# Patient Record
Sex: Male | Born: 1935 | Race: White | Hispanic: No | Marital: Married
Health system: Midwestern US, Community
[De-identification: ages and names within clinical notes are randomized; demographics above are authoritative.]

## PROBLEM LIST (undated history)

## (undated) DIAGNOSIS — Z01818 Encounter for other preprocedural examination: Secondary | ICD-10-CM

## (undated) DIAGNOSIS — C439 Malignant melanoma of skin, unspecified: Principal | ICD-10-CM

## (undated) DIAGNOSIS — U071 COVID-19: Secondary | ICD-10-CM

## (undated) DIAGNOSIS — N401 Enlarged prostate with lower urinary tract symptoms: Secondary | ICD-10-CM

## (undated) DIAGNOSIS — D649 Anemia, unspecified: Secondary | ICD-10-CM

## (undated) DIAGNOSIS — K223 Perforation of esophagus: Secondary | ICD-10-CM

## (undated) DIAGNOSIS — N400 Enlarged prostate without lower urinary tract symptoms: Secondary | ICD-10-CM

## (undated) DIAGNOSIS — K635 Polyp of colon: Secondary | ICD-10-CM

## (undated) DIAGNOSIS — Z950 Presence of cardiac pacemaker: Secondary | ICD-10-CM

## (undated) DIAGNOSIS — E559 Vitamin D deficiency, unspecified: Secondary | ICD-10-CM

## (undated) DIAGNOSIS — I4891 Unspecified atrial fibrillation: Secondary | ICD-10-CM

## (undated) DIAGNOSIS — I480 Paroxysmal atrial fibrillation: Secondary | ICD-10-CM

## (undated) HISTORY — PX: TOTAL HIP ARTHROPLASTY: SHX124

## (undated) HISTORY — DX: Benign prostatic hyperplasia with lower urinary tract symptoms: N40.1

## (undated) HISTORY — PX: OTHER SURGICAL HISTORY: SHX169

## (undated) HISTORY — PX: TOTAL KNEE ARTHROPLASTY: SHX125

## (undated) HISTORY — DX: Polyp of colon: K63.5

## (undated) HISTORY — PX: PACEMAKER INSERTION: SHX728

## (undated) HISTORY — PX: THORACOTOMY: SHX5074

## (undated) HISTORY — DX: COVID-19: U07.1

## (undated) HISTORY — DX: Paroxysmal atrial fibrillation: I48.0

## (undated) HISTORY — DX: Unspecified atrial fibrillation: I48.91

## (undated) HISTORY — DX: Perforation of esophagus: K22.3

## (undated) HISTORY — DX: Vitamin D deficiency, unspecified: E55.9

## (undated) HISTORY — DX: Presence of cardiac pacemaker: Z95.0

## (undated) HISTORY — DX: Hypomagnesemia: E83.42

## (undated) MED FILL — Acetaminophen Tab 325 MG: ORAL | Qty: 2 | Status: AC

---

## 2005-08-14 ENCOUNTER — Inpatient Hospital Stay (HOSPITAL_COMMUNITY): Admission: EM | Admit: 2005-08-14 | Discharge: 2005-09-11 | Payer: Self-pay | Admitting: Emergency Medicine

## 2005-09-18 ENCOUNTER — Encounter: Admission: RE | Admit: 2005-09-18 | Discharge: 2005-09-18 | Payer: Self-pay | Admitting: Cardiothoracic Surgery

## 2005-09-25 ENCOUNTER — Ambulatory Visit (HOSPITAL_COMMUNITY): Admission: RE | Admit: 2005-09-25 | Discharge: 2005-09-25 | Payer: Self-pay | Admitting: Cardiothoracic Surgery

## 2006-01-27 DIAGNOSIS — K223 Perforation of esophagus: Secondary | ICD-10-CM | POA: Insufficient documentation

## 2012-09-08 ENCOUNTER — Encounter

## 2012-09-12 LAB — EKG 12-LEAD: ECG Report: ABNORMAL

## 2012-09-12 NOTE — Patient Instructions (Signed)
Brett Middleton                                                                                                                     PRE OP INSTRUCTIONS FOR  Brett Middleton        Date: 09/12/2012    Date and time of surgery: 09/15/12 at 0830  Arrival Time: 0700    1. Do not eat or drink anything after 12 midnight prior to surgery. This includes no water, chewing gum, mints or ice chips.    2. Take the following pills with a small sip of water on the morning of Surgery: none    3. Diabetics may take evening dose of insulin but none after midnight.  If you feel symptomatic or low blood sugar take 1-2 ounces of apple juice only.    4. Aspirin, Ibuprofen, Advil, Naproxen, Vitamin E and other Anti-inflammatory products should be stopped  before surgery  as directed by your physician.    5. Check with your Doctor regarding stopping Plavix, Coumadin, Lovenox, Fragmin or other blood thinners.    6. Do not smoke,use illicit drugs and do not drink any alcoholic beverages 24 hours prior to surgery.    7. You may brush your teeth and gargle the morning of surgery.  DO NOT SWALLOW WATER    8. You MUST make arrangements for a responsible adult to take you home after your surgery. You will not be allowed to leave alone or drive yourself home.  It is strongly suggested someone stay with you the first 24 hrs. Your surgery will be cancelled if you do not have a ride home.    9. A parent/legal guardian must accompany a child scheduled for surgery and plan to stay at the hospital until the child is discharged.  Please do not bring other children with you.    10. Please wear simple, loose fitting clothing to the hospital.  Do not bring valuables (money, credit cards, checkbooks, etc.) Do not wear any makeup (including no eye makeup) or nail polish on your fingers or toes.    11. DO NOT wear any jewelry or piercings on day of surgery.  All body piercing jewelry must be removed.    12. Shower the night before surgery with  _X__Antibacterial soap ___Hibiclens.    13. Remember to bring Blood Bank bracelet to the hospital on the day of surgery.    14. If you have a Living Will and Durable Power of Attorney for Healthcare, please bring in a copy.    15. If appropriate bring crutches, inspirex, etc...    16. Notify your Surgeon if you develop any illness between now and surgery time, cough, cold, fever, sore throat, nausea, vomiting, etc.  Please notify your surgeon if you experience dizziness, shortness of breath or blurred vision between now & the time of your surgery.    17. If you have ___dentures, they will be removed before going to the OR; we will provide  you a container. If you wear ___contact lenses or _X__glasses, they will be removed; please bring a case for them.    18. To provide excellent care visitors will be limited to one in the room at any given time.    19. Please bring picture ID and insurance card.    20. Sleep apnea patients need to bring CPAP to hospital on day of surgery.                                                                                         21. Visit our web site for additional information: [email or web address]    22. During flu season no children under the age of 65 are permitted in the hospital for the safety of all patients.     23. Other Report to [facility]             Please call pre admission testing if you have any further questions.   Pre Admit Testing           [phone]     [facility] [phone]

## 2012-09-15 MED ORDER — IBUPROFEN 400 MG PO TABS
400 | Freq: Four times a day (QID) | ORAL | Status: DC | PRN
Start: 2012-09-15 — End: 2012-09-16

## 2012-09-15 MED ORDER — OXYCODONE-ACETAMINOPHEN 5-325 MG PO TABS
5-325 | ORAL | Status: DC | PRN
Start: 2012-09-15 — End: 2012-09-16

## 2012-09-15 MED ORDER — ACETAMINOPHEN 325 MG PO TABS
325 | ORAL | Status: DC | PRN
Start: 2012-09-15 — End: 2012-09-16

## 2012-09-15 MED ORDER — HYDROCODONE-ACETAMINOPHEN 5-325 MG PO TABS
5-325 | ORAL | Status: DC | PRN
Start: 2012-09-15 — End: 2012-09-16

## 2012-09-15 MED ORDER — FENTANYL CITRATE 0.05 MG/ML IJ SOLN
0.05 | INTRAMUSCULAR | Status: DC | PRN
Start: 2012-09-15 — End: 2012-09-16

## 2012-09-15 MED ADMIN — lactated ringers infusion: INTRAVENOUS | @ 13:00:00 | NDC 00338011704

## 2012-09-15 MED ADMIN — ceFAZolin (ANCEF) 2 g in dextrose 3% 50mL IVPB (duplex): INTRAVENOUS | @ 15:00:00 | NDC 00264310511

## 2012-09-15 MED FILL — LACTATED RINGERS IV SOLN: INTRAVENOUS | Qty: 1000

## 2012-09-15 MED FILL — CEFAZOLIN SODIUM-DEXTROSE 2-3 GM-% IV SOLR: 2-3 GM-% | INTRAVENOUS | Qty: 2

## 2012-09-15 MED FILL — LIDOCAINE-EPINEPHRINE 1 %-1:100000 IJ SOLN: 1 %-:00000 | INTRAMUSCULAR | Qty: 40

## 2012-09-15 NOTE — Op Note (Signed)
Brett Middleton, Brett Middleton                                [id number]      DATE OF PROCEDURE:  09/15/2012      SURGEON:  Terrence Dupont, M.D.      ASSISTANT:      PREOPERATIVE DIAGNOSIS:  Melanoma, left arm.      POSTOPERATIVE DIAGNOSIS:      OPERATION:  Excision melanoma, left arm.      ANESTHESIA:  Monitored anesthesia.      ESTIMATED BLOOD LOSS:      COMPLICATIONS:      OPERATIVE INDICATIONS: This a 78 year old gentleman who came to me with a   biopsy cone melanoma on his left upper arm.  He apparently had a dark mole on   his left upper arm that was removed by Dr. Caryl Asp on December 6th, and the   pathology showed melanoma of 0.44-mm thickness.   I have recommended a   conservative wide excision with 1-cm margin.  I have discussed the benefits,   risks, and complications of surgery with the patient.  All his questions have   been answered.      OPERATIVE PROCEDURE:  The patient was brought to the operating room and was   placed supine on the operating table.  Intravenous sedation and general   monitoring was done by the anesthesiologist.  Local anesthetic 1% lidocaine   with epinephrine was injected.      The lesion was outlined with marking pen.  A 15-blade was used to make the   incision.  An elliptical excision was made.  The defect measured 9 x 4.5 cm.   The wound was closed in layers using 3-0 Vicryl for the dermis and   subcutaneous tissue and 4-0 nylon for the skin.  Dressing consisted of   Steri-Strips, 4 x 4 gauze, and Kling.      The patient tolerated the procedure well and was transferred to the recovery   room in a stable condition.         Dictated by:  Terrence Dupont, M.D.               ANP / nmt   DD: 09/15/2012   11:46 A    DT: 09/15/2012  4:05 P   [id number]    [id number]   CC:  Jeris Penta, M.D.        Terrence Dupont, M.D.        Samuel Germany, M.D.

## 2012-09-15 NOTE — H&P (Signed)
Pre-operative History and Physical      Brett Middleton    DIAGNOSIS:  Melanoma left arm    PROCEDURE:  Excision melanoma left arm     CHIEF COMPLAINT:  Lesion left arm    HISTORY OF PRESENT ILLNESS:      The patient is a 78 y.o. male  who presents with lesion left arm    Past Medical History:        Diagnosis Date   ??? Hyperlipidemia        Past Surgical History:        Procedure Laterality Date   ??? Colonoscopy     ??? Skin biopsy     ??? Joint replacement Right 07/18/10     hip   ??? Pacemaker placement Left 04/2008       Medications Prior to Admission:   Zocor    Allergies:  Review of patient's allergies indicates no known allergies.    PHYSICAL EXAM:       VITALS:  There were no vitals taken for this visit.    Head/ENT:  normocepalic, without obvious abnormality    Neck:  supple, no masses    Lungs:  clear to auscultation bilaterally, no crackles or wheezing    Heart:  normal S1 and S2    Breasts:      Abdomen:  Soft, no masses palpable, normal bowel sounds    Extremities:  No clubbing, cyanosis, or edema    DATA:    CBC:   No results found for this basename: WBC, RBC, HGB, HCT, MCV, MCH, MCHC, RDW, PLT, MPV     BMP:    No results found for this basename: NA, K, CL, CO2, BUN, LABALBU, CREATININE, CALCIUM, GFRAA, LABGLOM, AGLUCOSE       ASSESSMENT AND PLAN:    1.  Patient is a 78 y.o. male with above specified procedure planned under MAC    2.  Procedure options, risks, benefits, complications and limitations reviewed with patient. He expresses understanding.      Brett Middleton  09/15/2012

## 2012-09-15 NOTE — Brief Op Note (Signed)
Brief Postoperative Note    Brett Middleton  Date of Birth:  [birth year]  [id number]    Pre-operative Diagnosis: Melanoma left arm    Post-operative Diagnosis: Same    Procedure: Excision melanoma left arm    Anesthesia: MAC    Surgeons/Assistants: Oma Alpert N Coley Littles    Estimated Blood Loss: Minimal    Complications: none    Specimens: were obtained      [name] N Caidance Sybert

## 2012-09-15 NOTE — Anesthesia Post-Procedure Evaluation (Signed)
Department of Anesthesiology  Post-Anesthesia Note    Name:  Brett Middleton                                         Age:  78 y.o.  MRN:  [id number]     Last Vitals:  BP 137/68   Pulse 78   Temp(Src) 97.7 ??F (36.5 ??C) (Temporal)   Resp 16   Wt 188 lb (85.276 kg)   BMI 26.23 kg/m2   SpO2 98%  Patient Vitals for the past 4 hrs:   BP Temp Temp src Pulse Resp SpO2 Weight   09/15/12 0713 137/68 mmHg 97.7 ??F (36.5 ??C) Temporal 78 16 98 % 188 lb (85.276 kg)       Level of Consciousness:  Awake    Respiratory:  Stable    Oxygen Saturation:  Stable    Cardiovascular:  Stable    Hydration:  Adequate    PONV:  Stable    Post-op Pain:  Adequate analgesia    Post-op Assessment:  No apparent anesthetic complications    Additional Follow-Up / Treatment / Comment:  None    Sharion Balloon, DO  September 15, 2012   10:31 AM

## 2012-09-15 NOTE — Discharge Instructions (Signed)
DISCHARGE INSTRUCTIONS - Brett Middleton N [place]    General    *You can shower daily  *Resume your usual diet. Good nutrition promotes healing. Increase fluid intake.   *Do not bend, lift or carry anything heavy or indulge in any vigorous activity for 2 weeks    Dressings/Incision care    *If you have steristrips along the incisions, you can wash on top of it, but leave them intact      Medications    *Take the pain meds if prescribed, otherwise take tylenol or ibuprofen  *Call your family doctor to check if you can resume all your regular medications    Signs of Infection    *Call your surgeon if severe uncontrolled pain at the operated area, redness, swelling, odor or green/yellow discharge around the site      Appointment    *Please call our office [phone] to schedule appointment in 15 days    In an emergency, call 911 or go to an Emergency Department at a nearby hospital          I understand and acknowledge receipt of the above instructions.                                                                                                                                          Patient or Guardian Signature                                                         Date/Time                                                                                                                                            Physician's or R.N.'s Signature                                                                    Date/Time      The discharge instructions have been reviewed with the patient and/or Guardian.  Patient and/or Guardian signed and retained a printed copy.

## 2012-09-15 NOTE — Anesthesia Pre-Procedure Evaluation (Signed)
Brett Middleton       Department of Anesthesiology  Pre-Anesthesia Evaluation/Consultation       Name:  Brett Middleton                                         Age:  78 y.o.  MRN:  [id number]           Procedure (Scheduled):  exc lesion arm  Surgeon:  Dr. Zannie Cove     No Known Allergies  There is no problem list on file for this patient.    Past Medical History   Diagnosis Date   ??? Hyperlipidemia      Past Surgical History   Procedure Laterality Date   ??? Colonoscopy     ??? Skin biopsy     ??? Joint replacement Right 07/18/10     hip   ??? Pacemaker placement Left 04/2008     History   Substance Use Topics   ??? Smoking status: Former Smoker   ??? Smokeless tobacco: Former Neurosurgeon     Quit date: 09/08/1972   ??? Alcohol Use: No     Medications  Current Outpatient Prescriptions on File Prior to Encounter   Medication Sig Dispense Refill   ??? simvastatin (ZOCOR) 20 MG tablet Take 20 mg by mouth nightly.       ??? Cyanocobalamin (B-12) 2500 MCG TABS Take 2,500 mcg by mouth every other day.         No current facility-administered medications on file prior to encounter.     Current Outpatient Prescriptions   Medication Sig Dispense Refill   ??? simvastatin (ZOCOR) 20 MG tablet Take 20 mg by mouth nightly.       ??? Cyanocobalamin (B-12) 2500 MCG TABS Take 2,500 mcg by mouth every other day.         Current Facility-Administered Medications   Medication Dose Route Frequency Provider Last Rate Last Dose   ??? lactated ringers infusion   Intravenous Continuous Judieth Keens, MD 10 mL/hr at 09/15/12 [phone]     ??? sodium chloride flush 0.9 % injection 10 mL  10 mL Intravenous Q12H SCH Arvind N Padubidri, MD       ??? sodium chloride flush 0.9 % injection 10 mL  10 mL Intravenous PRN Terrence Dupont, MD       ??? ceFAZolin (ANCEF) 2 g in dextrose 3% 50mL IVPB (duplex)  2 g Intravenous On Call to OR Terrence Dupont, MD         Vital Signs (Current)   Filed Vitals:    09/15/12 0713   BP: 137/68   Pulse: 78   Temp: 97.7 ??F (36.5 ??C)   Resp: 16         Vital Signs Statistics (for past 48 hrs)     BP  Min: 137/68   Min taken time: 09/15/12 0713  Max: 137/68   Max taken time: 09/15/12 0713  Temp  Avg: 97.7 ??F (36.5 ??C)  Min: 97.7 ??F (36.5 ??C)   Min taken time: 09/15/12 0713  Max: 97.7 ??F (36.5 ??C)   Max taken time: 09/15/12 0713  Pulse  Avg: 78  Min: 78   Min taken time: 09/15/12 0713  Max: 78   Max taken time: 09/15/12 0713  Resp  Avg: 16  Min: 16   Min taken time: 09/15/12 0713  Max: 16  Max taken time: 09/15/12 0713  SpO2  Avg: 98 %  Min: 98 %   Min taken time: 09/15/12 0713  Max: 98 %   Max taken time: 09/15/12 0713    BP Readings from Last 3 Encounters:   09/15/12 137/68   09/12/12 118/72     BMI  Body mass index is 26.23 kg/(m^2).  Estimated body mass index is 26.23 kg/(m^2) as calculated from the following:    Height as of 09/08/12: 5\' 11"  (1.803 m).    Weight as of this encounter: 188 lb (85.276 kg).    CBC   No results found for this basename: WBC, RBC, HGB, HCT, MCV, RDW, PLT     CMP    No results found for this basename: NA, K, CL, CO2, BUN, CREATININE, GFRAA, AGRATIO, LABGLOM, GLUCOSE, GLU, PROT, CALCIUM, BILITOT, ALKPHOS, AST, ALT     BMP    No results found for this basename: NA, K, CL, CO2, BUN, CREATININE, CALCIUM, GFRAA, LABGLOM, GLUCOSE, GLU     POCGlucose  No results found for this basename: GLUCOSE,  in the last 72 hours   Coags    No results found for this basename: PROTIME, INR, APTT     HCG (If Applicable)   No results found for this basename: PREGTESTUR, PREGSERUM, HCG, HCGQUANT      ABGs   No results found for this basename: PHART, PO2ART, PCO2ART, HCO3ART, BEART, O2SATART      Type & Screen (If Applicable)  No results found for this basename: LABABO, LABRH     Radiology (If Applicable)  Cardiac Testing (If Applicable)   EKG (If Applicable) SR, LAHB   Anesthesia Evaluation     Patient summary reviewed    Airway   Mallampati: II  TM distance: >3 FB  Neck ROM: full  Dental      Pulmonary - negative ROS and normal exam   Cardiovascular -  normal exam  (+) pacemaker,     Rhythm: regular  Rate: normal    Neuro/Psych - negative ROS   GI/Hepatic/Renal - negative ROS     Endo/Other - negative ROS   Abdominal  - normal exam                  Allergies: Review of patient's allergies indicates no known allergies.    NPO Status: Time of last liquid consumption: 1800                       Time of last solid food consumption: 1800    Anesthesia Plan    ASA 3     other   (TIVA planned with supplemental oxygen.  D/W pt. Plan and agreed.)  intravenous induction   Anesthetic plan and risks discussed with patient.    Plan discussed with CRNA.          Sharion Balloon  09/15/2012

## 2015-07-29 NOTE — Care Coordination-Inpatient (Signed)
Social Work:    Social service met with Brett Middleton and his wife today in boot-camp for December 2nd, left TKA.  Social work advised patient of social work role, PT/OT evaluations after surgery, goal of home with home care therapy, durable medical equipment recommended, skilled rehab. if necessary and eligible under insurance guidelines, and hospital length of stay.     Brett Middleton resides in at home with his wife. He has a cane, and walker, and plans to return home with MVI (after choices).      Electronically signed by Windy Carina, LSW on 07/29/2015 at 11:07 AM

## 2015-07-30 NOTE — Care Coordination-Inpatient (Signed)
I met with Mr. & Mrs. Vanatta on 07/29/15 for an orthopaedic education class. We discussed information regarding pre, intra, post-op, discharge, and LOS expectations. I presented the Total Knee video, and provided ortho education materials. I encouraged the patient to call with any questions or concerns. They also met with Sharyn Lull from SW to discuss discharge planning.

## 2015-07-31 ENCOUNTER — Encounter

## 2015-07-31 LAB — CBC WITH AUTO DIFFERENTIAL
Basophils %: 0 % (ref 0–2)
Basophils Absolute: 0.02 E9/L (ref 0.00–0.20)
Eosinophils %: 2 % (ref 0–6)
Eosinophils Absolute: 0.07 E9/L (ref 0.05–0.50)
Hematocrit: 36.5 % — ABNORMAL LOW (ref 37.0–54.0)
Hemoglobin: 12.5 g/dL (ref 12.5–16.5)
Lymphocytes %: 21 % (ref 20–42)
Lymphocytes Absolute: 1.03 E9/L — ABNORMAL LOW (ref 1.50–4.00)
MCH: 31.1 pg (ref 26.0–35.0)
MCHC: 34.4 % (ref 32.0–34.5)
MCV: 90.5 fL (ref 80.0–99.9)
MPV: 8.3 fL (ref 7.0–12.0)
Monocytes %: 10 % (ref 2–12)
Monocytes Absolute: 0.5 E9/L (ref 0.10–0.95)
Neutrophils %: 66 % (ref 43–80)
Neutrophils Absolute: 3.19 E9/L (ref 1.80–7.30)
PLATELET SLIDE REVIEW: DECREASED
Platelets: 109 E9/L — ABNORMAL LOW (ref 130–450)
RBC: 4.03 E12/L (ref 3.80–5.80)
RDW: 18.3 fL — ABNORMAL HIGH (ref 11.5–15.0)
WBC: 4.8 E9/L (ref 4.5–11.5)

## 2015-07-31 LAB — PROTIME-INR
INR: 1.1
Protime: 11.5 s (ref 9.3–12.4)

## 2015-07-31 LAB — BASIC METABOLIC PANEL
Anion Gap: 11 mmol/L (ref 7–16)
BUN: 18 mg/dL (ref 8–23)
CO2: 27 mmol/L (ref 22–29)
Calcium: 9 mg/dL (ref 8.6–10.2)
Chloride: 97 mmol/L — ABNORMAL LOW (ref 98–107)
Creatinine: 1 mg/dL (ref 0.7–1.2)
GFR African American: >60
GFR Non-African American: >60 mL/min/{1.73_m2} (ref >=60)
Glucose: 91 mg/dL (ref 74–109)
Potassium: 4.6 mmol/L (ref 3.5–5.0)
Sodium: 135 mmol/L (ref 132–146)

## 2015-07-31 LAB — PLATELET CONFIRMATION

## 2015-08-08 DIAGNOSIS — M1712 Unilateral primary osteoarthritis, left knee: Secondary | ICD-10-CM | POA: Insufficient documentation

## 2015-08-13 NOTE — H&P (Signed)
Margarita RanaSESCOURKA, Sparrow                                [id number]      DATE:  06/13/2015         CHIEF COMPLAINT:  Left knee pain.      HISTORY OF PRESENT ILLNESS:  This is an 81 year old white male patient with   chronic left knee pain secondary to osteoarthritis.  He has failed   conservative therapies of anti-inflammatories, analgesics, and injections and   bracing and has now decided to proceed with left total knee arthroplasty.      PAST MEDICAL HISTORY:  Past medical history is significant for   1.    Heart disease   2.    Hypertension      PAST SURGICAL HISTORY:  Prior surgeries include   1.    Right total hip arthroplasty   2.    Pacer insertion   3.    Esophageal tear repair      FAMILY HISTORY:  Family history is significant for   1.    Liver disease   2.    Myocardial infarction      ALLERGIES:  None.      CURRENT MEDICATIONS:   1.    Ferrous sulfate   2.    Neurontin   3.    Zocor   4.    Vitamin B12      SOCIAL HISTORY:  Admits to prior tobacco use.  Denies alcohol consumption.   PCP is Dr. Meriel PicaMikula.      REVIEW OF SYSTEMS:  Allergies as stated above.  Skin and lymphatic:  Denies   rashes and lesions.  CNS:  No prior CVAs.  Respiratory:  No cough or   shortness of breath.  Circulatory:  History of heart disease and   hypertension.  Digestive:  No dyspepsia.  Genitourinary:  No dysuria.   Metabolic and endocrine:  No thyroid disease or diabetes.  Hematologic:  No   anemia.  Musculoskeletal:  Positive OA.  Psychiatric:  Negative.                                  PHYSICAL EXAMINATION      GENERAL APPEARANCE:  Well-nourished, well-developed 81 year old white male in   no acute distress, alert and oriented x3.      HEENT:  Head normocephalic and atraumatic.  Ears clear and functional.  Eyes   and fundi:  PERRLA.  Nose clear without deviation.  Mouth, teeth, throat:   Clear and functional.      NECK:  Supple.  No JVD.      CHEST:  Normal excursion.  Breasts deferred.      LUNGS:  Clear to auscultation and  percussion.      CARDIOVASCULAR:  Heart:  Regular rate and rhythm.  No murmurs, gallops, rubs   appreciated.      ABDOMEN:  Round, soft, nontender.      HERNIA:  Negative.      GENITALIA:  Exam deferred.      RECTAL:  Exam deferred.      BACK:  Nontender.      EXTREMITIES:  Without clubbing, cyanosis, or edema.  Exam of left knee:  10   to 100 degrees of range of motion with positive medial joint tenderness,   varus alignment,  positive laxity.  No effusion.  Positive crepitus.  Distal   pulses 2/2.  Neurovascular status distally intact.      SKIN:  Skin without rashes, masses, or lesions.      LYMPHATICS:  Lymph nodes:  No adenopathy.      NEUROLOGICAL:  Cranial nerves 2 through 12 are grossly intact.      CLINICAL IMPRESSION:   OA, left knee.      PLAN:  Left total knee arthroplasty.            Dictated by:  Baruch Merl, M.D.         Joanne Gavel   DD: 08/13/2015   08:22 A    DT: 08/13/2015 09:53 A   [id number]    [id number]   CC:   Baruch Merl, M.D.

## 2015-08-20 ENCOUNTER — Inpatient Hospital Stay: Admit: 2015-08-20 | Attending: Orthopaedic Surgery | Primary: Internal Medicine

## 2015-08-20 DIAGNOSIS — M1712 Unilateral primary osteoarthritis, left knee: Secondary | ICD-10-CM

## 2015-08-20 LAB — BASIC METABOLIC PANEL
Anion Gap: 11 mmol/L (ref 7–16)
BUN: 23 mg/dL (ref 8–23)
CO2: 25 mmol/L (ref 22–29)
Calcium: 8.6 mg/dL (ref 8.6–10.2)
Chloride: 100 mmol/L (ref 98–107)
Creatinine: 1 mg/dL (ref 0.7–1.2)
GFR African American: >60
GFR Non-African American: >60 mL/min/{1.73_m2} (ref >=60)
Glucose: 90 mg/dL (ref 74–109)
Potassium: 4.5 mmol/L (ref 3.5–5.0)
Sodium: 136 mmol/L (ref 132–146)

## 2015-08-20 LAB — EKG 12-LEAD
Atrial Rate: 66 {beats}/min
P Axis: 31 degrees
P-R Interval: 216 ms
Q-T Interval: 442 ms
QRS Duration: 112 ms
QTc Calculation (Bazett): 528 ms
R Axis: -58 degrees
T Axis: 9 degrees
Ventricular Rate: 86 {beats}/min

## 2015-08-20 LAB — CBC
Hematocrit: 35.8 % — ABNORMAL LOW (ref 37.0–54.0)
Hemoglobin: 12.3 g/dL — ABNORMAL LOW (ref 12.5–16.5)
MCH: 31.3 pg (ref 26.0–35.0)
MCHC: 34.5 % (ref 32.0–34.5)
MCV: 90.8 fL (ref 80.0–99.9)
MPV: 8.5 fL (ref 7.0–12.0)
Platelets: 89 E9/L — ABNORMAL LOW (ref 130–450)
RBC: 3.95 E12/L (ref 3.80–5.80)
RDW: 18.5 fL — ABNORMAL HIGH (ref 11.5–15.0)
WBC: 4.2 E9/L — ABNORMAL LOW (ref 4.5–11.5)

## 2015-08-20 LAB — PLATELET CONFIRMATION

## 2015-08-20 NOTE — Anesthesia Pre-Procedure Evaluation (Addendum)
Department of Anesthesiology  Preprocedure Note       Name:  Brett Middleton   Age:  81 y.o.  DOB:  June 11, 1931                                          MRN:  [id number]         Date:  08/20/2015      Surgeon: Catha Nottingham    Procedure: Lt total knee arthroplasty    Medications prior to admission:   Prior to Admission medications    Medication Sig Start Date End Date Taking? Authorizing Provider   simvastatin (ZOCOR) 20 MG tablet Take 20 mg by mouth nightly.    Historical Provider, MD   Cyanocobalamin (B-12) 2500 MCG TABS Take 2,500 mcg by mouth every other day.    Historical Provider, MD       Current medications:    Current Outpatient Prescriptions   Medication Sig Dispense Refill   ??? simvastatin (ZOCOR) 20 MG tablet Take 20 mg by mouth nightly.     ??? Cyanocobalamin (B-12) 2500 MCG TABS Take 2,500 mcg by mouth every other day.       No current facility-administered medications for this encounter.        Allergies:  No Known Allergies    Problem List:    Patient Active Problem List   Diagnosis Code   ??? Osteoarthritis of left knee M17.9       Past Medical History:        Diagnosis Date   ??? Hyperlipidemia        Past Surgical History:        Procedure Laterality Date   ??? Colonoscopy     ??? Skin biopsy     ??? Joint replacement Right 07/18/10     hip   ??? Pacemaker placement Left 04/2008       Social History:    Social History   Substance Use Topics   ??? Smoking status: Former Smoker   ??? Smokeless tobacco: Former Neurosurgeon     Quit date: 09/08/1972   ??? Alcohol use No                                Counseling given: Not Answered      Vital Signs (Current): There were no vitals filed for this visit.                                           BP Readings from Last 3 Encounters:   09/15/12 118/70   09/12/12 118/72       NPO Status:                                                                                 BMI:   Wt Readings from Last 3 Encounters:   09/15/12 188 lb (85.3 kg)   09/12/12 191 lb 11.2 oz (87  kg)   09/08/12 195 lb (88.5 kg)      There is no height or weight on file to calculate BMI.    Anesthesia Evaluation  Patient summary reviewed and Nursing notes reviewed no history of anesthetic complications:   Airway: Mallampati: II  TM distance: >3 FB   Neck ROM: full  Mouth opening: > = 3 FB Dental:      Comment: None loose  Poor dentition    Pulmonary:negative ROS breath sounds clear to auscultation         Cardiovascular:    (+) CAD (Negative persantine stress May 2016):,     Pacemaker: Pacemaker - [employer]. Last interrogation was in September 2016.      Rhythm: regular  Rate: normal           ROS comment: Pacer  Cardiology clearance Dr. Berniece SalinesMahjoub        Neuro/Psych:         Comments: HOH GI/Hepatic/Renal: neg ROS          Endo/Other:    (+) : arthritis (Lt. knee): OA.      Abdominal:             Other findings: Permanent lower denture. Been in for 20 years.       Anesthesia Plan    ASA 3     general and spinal   (Lt. Femoral nerve block)  intravenous induction   Anesthetic plan and risks discussed with patient.  Use of blood products discussed with patient whom consented to blood products.       August 20, 2015 11:39 AM  PAT visit. Femorla block discuissed, he ahs agreed to it.  An EKG was obtained. Atrial rhythm with frequent PVCs.    Trena PlattFrederick A Peachman, MD   08/20/2015        Patient seen and examined, chart reviewed, agree with above findings.  Anesthetic plan, risks, benefits, alternatives, and personnel involved discussed with patient and family.  Patient and family verbalized an understanding and agreed to proceed.  They chose Spinal and femoral nerve block.    Anesthetic plan discussed with care team members and agreed upon.    Mena PaulsSteven Mitchell Jonas Goh, DO

## 2015-08-20 NOTE — Progress Notes (Signed)
Brett Middleton has a pacemaker, inserted 04/2008, follows with Dr Berniece SalinesRamon DredgeMahjoub, interrogation called for.  Per request Dr Rockwell GermanyPeachman, EKG was repeated D/T poor tracing, was reviewed and OK for this surgery.    Patient to return 08/21/2015 for repeat CBC/platelet count due to platelet clumping, per order Dr Vernetta HoneyAltenhof.  CBC results faxed to Drs Catha NottinghamJamison and Meriel PicaMikula.

## 2015-08-20 NOTE — Patient Instructions (Signed)
[facility] PRE-ADMISSION TESTING INSTRUCTIONS    The Preadmission Testing patient is instructed accordingly using the following criteria (check applicable):    ARRIVAL INSTRUCTIONS:  [x]  Parking the day of Surgery is located in the Main Entrance lot.  Upon entering the door, make an immediate right to the surgery reception desk    [x]  Complimentary [employer] Parking is available Monday through Friday 6 am to 6 pm    []  Bring photo ID and insurance card    [x]  Bring in a copy of Living will or Durable Power of attorney papers.    []  Please be sure to arrange transportation to and from the hospital    []  Please arrange for someone to be with you the remainder of the day due to having anesthesia      GENERAL INSTRUCTIONS:    [x]  Nothing by mouth after midnight, including gum, candy, mints or water    [x]  You may brush your teeth, but do not swallow any water    []  Take medications as instructed with 1-2 oz of water    [x]  Stop herbal supplements and vitamins 5 days prior to procedure    []  Follow preop dosing of blood thinners per physician instructions    []  Do not take insulin or oral diabetic medications    []  If diabetic and have low blood sugar or feel symptomatic, take 1-2oz apple juice or glucose tablets    []  Bring inhalers day of surgery    []  Bring C-PAP/ Bi-Pap day of surgery    []  Bring urine specimen day of surgery    []  Antibacterial Soap shower or bath AM of Surgery, no lotion, powders or creams to surgical site    []  Follow bowel prep as instructed per surgeon    []  No smoking within 24 hours of surgery     []  No alcohol or illegal drug use within 24 hours of surgery.    [x]  Jewelry, body piercing's, eyeglasses, contact lenses and dentures are not permitted into surgery (bring cases)      []  Please do not wear any nail polish or make up on the day of surgery    []  If not already done, you can expect a call from registration    [x]  If surgeon requests a time change you will be  notified the day prior to surgery    [x]  If you receive a survey after surgery we would greatly appreciate your comments    []  Parent/guardian of a minor must accompany their child and remain on the premises  the entire time they are under our care     []  Pediatric patients may bring favorite toy, blanket or comfort item with them    []  A caregiver or family member must remain with the patient during their stay if they are mentally handicapped, have dementia, disoriented or unable to use a call light or would be a safety concern if left unattended    [x]  Please notify surgeon if you develop any illness between now and time of surgery (cold, cough, sore throat, fever, nausea, vomiting) or any signs of infections  including skin, wounds, and dental.    []  Other instructions    EDUCATIONAL MATERIALS PROVIDED:    [x]  PAT Preoperative Education Packet/Booklet     [x]  Medication List    [x]  Fluoroscopy Information Pamphlet    []  Transfusion bracelet applied with instructions    []  Joint replacement video reviewed    [  x] Shower with antibacterial soap and use CHG wipes provided the evening before surgery as instructed

## 2015-08-21 LAB — CBC
Hematocrit: 37 % (ref 37.0–54.0)
Hemoglobin: 12.8 g/dL (ref 12.5–16.5)
MCH: 31.7 pg (ref 26.0–35.0)
MCHC: 34.7 % — ABNORMAL HIGH (ref 32.0–34.5)
MCV: 91.2 fL (ref 80.0–99.9)
MPV: 7.6 fL (ref 7.0–12.0)
Platelets: 145 E9/L (ref 130–450)
RBC: 4.06 E12/L (ref 3.80–5.80)
RDW: 18.5 fL — ABNORMAL HIGH (ref 11.5–15.0)
WBC: 4.4 E9/L — ABNORMAL LOW (ref 4.5–11.5)

## 2015-08-21 NOTE — Progress Notes (Signed)
Repeat cbc 08/21/2015 faxed to Dr Catha NottinghamJamison

## 2015-08-22 LAB — CULTURE, MRSA, SCREENING

## 2015-08-22 NOTE — Progress Notes (Signed)
Spoke with Genene at Dr Corbin AdeJamison's office re: medical clearance note states pending labs/ekg,cardiac clearance. Cardiac clearance per Dr Berniece SalinesMahjoub on chart.

## 2015-08-23 ENCOUNTER — Inpatient Hospital Stay
Admit: 2015-08-23 | Discharge: 2015-08-26 | Disposition: A | Discharge status: Home / Self Care | Source: Home / Self Care | Attending: Orthopaedic Surgery | Admitting: Orthopaedic Surgery | Primary: Internal Medicine

## 2015-08-23 VITALS — BP 115/58 | HR 98 | Temp 98.40000°F | Resp 18 | Ht 71.0 in | Wt 195.0 lb

## 2015-08-23 DIAGNOSIS — Z01818 Encounter for other preprocedural examination: Principal | ICD-10-CM

## 2015-08-23 MED ORDER — HYDROMORPHONE HCL 1 MG/ML IJ SOLN
1 MG/ML | INTRAMUSCULAR | Status: DC | PRN
Start: 2015-08-23 — End: 2015-08-23

## 2015-08-23 MED ORDER — LIDOCAINE HCL 1 % IJ SOLN
1 % | Freq: Once | INTRAMUSCULAR | Status: DC | PRN
Start: 2015-08-23 — End: 2015-08-23

## 2015-08-23 MED ORDER — MIDAZOLAM HCL 2 MG/2ML IJ SOLN
2 MG/ML | INTRAMUSCULAR | Status: DC | PRN
Start: 2015-08-23 — End: 2015-08-23
  Administered 2015-08-23: 15:00:00 1 mg via INTRAVENOUS

## 2015-08-23 MED ORDER — CEFAZOLIN SODIUM-DEXTROSE 2-3 GM-% IV SOLR
2-3 GM-% | Freq: Three times a day (TID) | INTRAVENOUS | Status: AC
Start: 2015-08-23 — End: 2015-08-24
  Administered 2015-08-23 – 2015-08-24 (×3): 2 g via INTRAVENOUS

## 2015-08-23 MED ORDER — ROPIVACAINE HCL 5 MG/ML IJ SOLN
Freq: Once | INTRAMUSCULAR | Status: AC | PRN
Start: 2015-08-23 — End: 2015-08-23
  Administered 2015-08-23: 15:00:00 30 mL

## 2015-08-23 MED ORDER — HYDROMORPHONE HCL 1 MG/ML IJ SOLN
1 MG/ML | INTRAMUSCULAR | Status: DC | PRN
Start: 2015-08-23 — End: 2015-08-26

## 2015-08-23 MED ORDER — KETOROLAC TROMETHAMINE 30 MG/ML IJ SOLN
30 MG/ML | INTRAMUSCULAR | Status: AC
Start: 2015-08-23 — End: 2015-08-24

## 2015-08-23 MED ORDER — SODIUM CHLORIDE 0.9 % IV SOLN
0.9 % | INTRAVENOUS | Status: DC
Start: 2015-08-23 — End: 2015-08-23
  Administered 2015-08-23: 14:00:00 via INTRAVENOUS

## 2015-08-23 MED ORDER — NORMAL SALINE FLUSH 0.9 % IV SOLN
0.9 % | Freq: Two times a day (BID) | INTRAVENOUS | Status: DC
Start: 2015-08-23 — End: 2015-08-23

## 2015-08-23 MED ORDER — DOCUSATE SODIUM 100 MG PO CAPS
100 MG | Freq: Two times a day (BID) | ORAL | Status: DC
Start: 2015-08-23 — End: 2015-08-26
  Administered 2015-08-24 – 2015-08-26 (×5): 100 mg via ORAL

## 2015-08-23 MED ORDER — HYDROCODONE-ACETAMINOPHEN 5-325 MG PO TABS
5-325 MG | ORAL | Status: DC | PRN
Start: 2015-08-23 — End: 2015-08-25
  Administered 2015-08-24 – 2015-08-25 (×3): 1 via ORAL

## 2015-08-23 MED ORDER — KETOROLAC TROMETHAMINE 30 MG/ML IJ SOLN
30 MG/ML | Freq: Four times a day (QID) | INTRAMUSCULAR | Status: AC
Start: 2015-08-23 — End: 2015-08-24
  Administered 2015-08-23 – 2015-08-24 (×4): 15 mg via INTRAVENOUS

## 2015-08-23 MED ORDER — ASPIRIN EC 325 MG PO TBEC
325 MG | Freq: Every day | ORAL | Status: DC
Start: 2015-08-23 — End: 2015-08-26
  Administered 2015-08-23 – 2015-08-26 (×4): 325 mg via ORAL

## 2015-08-23 MED ORDER — HYDROCODONE-ACETAMINOPHEN 5-325 MG PO TABS
5-325 MG | ORAL | Status: DC | PRN
Start: 2015-08-23 — End: 2015-08-25

## 2015-08-23 MED ORDER — NORMAL SALINE FLUSH 0.9 % IV SOLN
0.9 % | INTRAVENOUS | Status: DC | PRN
Start: 2015-08-23 — End: 2015-08-26

## 2015-08-23 MED ORDER — NORMAL SALINE FLUSH 0.9 % IV SOLN
0.9 % | INTRAVENOUS | Status: DC | PRN
Start: 2015-08-23 — End: 2015-08-23

## 2015-08-23 MED ORDER — ONDANSETRON HCL 4 MG/2ML IJ SOLN
4 MG/2ML | Freq: Four times a day (QID) | INTRAMUSCULAR | Status: DC | PRN
Start: 2015-08-23 — End: 2015-08-26

## 2015-08-23 MED ORDER — DEXTROSE 5 % IV SOLN
5 % | Freq: Once | INTRAVENOUS | Status: DC
Start: 2015-08-23 — End: 2015-08-23

## 2015-08-23 MED ORDER — DEXAMETHASONE SODIUM PHOSPHATE 10 MG/ML IJ SOLN
10 MG/ML | Freq: Once | INTRAMUSCULAR | Status: AC
Start: 2015-08-23 — End: 2015-08-24
  Administered 2015-08-24: 21:00:00 10 mg via INTRAVENOUS

## 2015-08-23 MED ORDER — FENTANYL 0.05 MG/ML SOLN (MIXTURES ONLY)
0.05 mg/mL | Status: DC | PRN
Start: 2015-08-23 — End: 2015-08-23
  Administered 2015-08-23: 15:00:00 50 ug via INTRAVENOUS

## 2015-08-23 MED ORDER — SIMVASTATIN 20 MG PO TABS
20 MG | Freq: Every evening | ORAL | Status: DC
Start: 2015-08-23 — End: 2015-08-26
  Administered 2015-08-24 – 2015-08-26 (×3): 20 mg via ORAL

## 2015-08-23 MED ORDER — KETOROLAC TROMETHAMINE 30 MG/ML IJ SOLN
30 MG/ML | INTRAMUSCULAR | Status: AC
Start: 2015-08-23 — End: ?

## 2015-08-23 MED ORDER — CEFAZOLIN SODIUM-DEXTROSE 2-3 GM-% IV SOLR
2-3 GM-% | INTRAVENOUS | Status: AC
Start: 2015-08-23 — End: 2015-08-23
  Administered 2015-08-23: 16:00:00 2 g via INTRAVENOUS

## 2015-08-23 MED ORDER — VITAMIN B-12 1000 MCG PO TABS
1000 MCG | Freq: Every day | ORAL | Status: DC
Start: 2015-08-23 — End: 2015-08-26
  Administered 2015-08-24 – 2015-08-26 (×4): 2500 ug via ORAL

## 2015-08-23 MED ORDER — ROPIVACAINE HCL 5 MG/ML IJ SOLN
INTRAMUSCULAR | Status: AC
Start: 2015-08-23 — End: ?

## 2015-08-23 MED ORDER — ACETAMINOPHEN 325 MG PO TABS
325 MG | ORAL | Status: DC | PRN
Start: 2015-08-23 — End: 2015-08-26

## 2015-08-23 MED ORDER — NORMAL SALINE FLUSH 0.9 % IV SOLN
0.9 % | Freq: Two times a day (BID) | INTRAVENOUS | Status: DC
Start: 2015-08-23 — End: 2015-08-26
  Administered 2015-08-25 – 2015-08-26 (×4): 10 mL via INTRAVENOUS

## 2015-08-23 MED ORDER — DEXTROSE 5 % IV SOLN
5 % | INTRAVENOUS | Status: AC
Start: 2015-08-23 — End: 2015-08-23
  Administered 2015-08-23: 16:00:00 1000 mg via INTRAVENOUS

## 2015-08-23 MED ORDER — SODIUM CHLORIDE 0.9 % IV SOLN
0.9 % | INTRAVENOUS | Status: DC
Start: 2015-08-23 — End: 2015-08-26

## 2015-08-23 MED FILL — MIDAZOLAM HCL 2 MG/2ML IJ SOLN: 2 MG/ML | INTRAMUSCULAR | Qty: 2

## 2015-08-23 MED FILL — TRANEXAMIC ACID 1000 MG/10ML IV SOLN: 1000 MG/10ML | INTRAVENOUS | Qty: 10

## 2015-08-23 MED FILL — KETOROLAC TROMETHAMINE 30 MG/ML IJ SOLN: 30 MG/ML | INTRAMUSCULAR | Qty: 1

## 2015-08-23 MED FILL — VITAMIN B-12 1000 MCG PO TABS: 1000 MCG | ORAL | Qty: 3

## 2015-08-23 MED FILL — FENTANYL CITRATE (PF) 250 MCG/5ML IJ SOLN: 250 MCG/5ML | INTRAMUSCULAR | Qty: 5

## 2015-08-23 MED FILL — ASPIRIN EC 325 MG PO TBEC: 325 MG | ORAL | Qty: 1

## 2015-08-23 MED FILL — CEFAZOLIN SODIUM-DEXTROSE 2-3 GM-% IV SOLR: 2-3 GM-% | INTRAVENOUS | Qty: 2

## 2015-08-23 MED FILL — NAROPIN 5 MG/ML IJ SOLN: 5 MG/ML | INTRAMUSCULAR | Qty: 30

## 2015-08-23 MED FILL — SIMVASTATIN 20 MG PO TABS: 20 MG | ORAL | Qty: 1

## 2015-08-23 MED FILL — NAROPIN 5 MG/ML IJ SOLN: 5 MG/ML | INTRAMUSCULAR | Qty: 60

## 2015-08-23 MED FILL — FENTANYL CITRATE (PF) 100 MCG/2ML IJ SOLN: 100 MCG/2ML | INTRAMUSCULAR | Qty: 2

## 2015-08-23 NOTE — Plan of Care (Signed)
Problem: Pain - Acute:  Goal: Pain level will decrease  Pain level will decrease  Outcome: Ongoing  Patient verbalizes adequate pain control post op. Will dangle and stand this shift and remain free from injury

## 2015-08-23 NOTE — Brief Op Note (Signed)
Brief Postoperative Note    Brett Middleton  Date of Birth:  1931-08-22  [id number]    Brief Op Note TKA      Pre-op Diagnosis:  Left Knee Osteoarthritis    Post-op Diagnosis:  Same    Procedure:  Left Total Knee Arthroplasty    Surgeon:  [name], MD    Assistant:  Kirtland BouchardK. Waskin, PA-C (no qualified resident available)    Anesthesia:  spinal with Femoral nerve block    Fluids:  1700 cc    Estimated Blood Loss:  Less than 50 cc    Urine Output:  300 cc    Specimens:  Bone Cuts    Condition:  Stable    Disposition:  Recovery Room      [name]

## 2015-08-23 NOTE — Progress Notes (Signed)
Nursing Transfer Note    Data:  Summary of patients progress: Dr Catha NottinghamJamison left total knee with spinal  Reason for transfer: next level of care    Action:  Explained reason for transfer to Patient and Family.  Report given to: Holland CommonsMaria RN, using RN Handoff Navigator.  Mode of transportation: bed    Response:  RN Recommendations: pain mgt

## 2015-08-23 NOTE — Care Coordination-Inpatient (Signed)
Case Management: Social Work Case Management Initial Assessment  Brett Middleton,         Met with:patient  Information verified: address, contacts, phone number, DOB, insurance Yes  PCP: Gildardo Cranker, MD  Pharmacy:   [facility] [place], [place] - [address] - P [phone] Wanda Plump [phone]  [address] [place] [address]  Phone: [phone] Fax: [phone]         Discharge Planning  Patient Status Order: Inpatient Date:   Readmission within last 30 days:  yes  Current Residence:    Living Arrangements:      Home Access:    Entrance Stairs-Number of Steps:    Support Systems:     Current Services PTA:   Supplier:   Patient able to perform ADL's: needs assistance  DME used to aid ambulation prior to admission: none-has wwcane, possible bsc    Potential Assistance Needed:  Home vs. snf  Potential Assistance Purchasing Medications:  Home vs snf  Does patient want to participate in local refill/meds to beds program?:      Patient agreeable to home care: Yes if able  Type of Home Care Services:  HHC--nurse therapy  Patient expects to be discharged to:       Prior SNF/Rehab Placement and Facility: n/a  Agreeable to SNF/Rehab: No    Choices given to patient: yes    Expected Discharge date:  12-5-  Follow Up Appointment: Best Day/ Time:      Social Work Assessment/Plan: Met in PAT. Lives with wife in ranch home with 2 entry steps. Has a ww, cane, possibly a 3-1 commode, and prefers home with [facility] if able, vs. [facility]  Await therapy evals. [facility] following.      Electronically signed by Windy Carina, LSW on 08/23/15 at 3:38 PM

## 2015-08-23 NOTE — Progress Notes (Signed)
Pt admitted to PACU 1 from OR.  Placed on monitor, 2L NC, VSS.

## 2015-08-23 NOTE — Progress Notes (Signed)
Dr Gardiner Fantipopovec notified of consult for medical management, will put in orders

## 2015-08-23 NOTE — Progress Notes (Signed)
PNB completed. VSS. Patient tolerated procedure well. Family brought to bedside.

## 2015-08-23 NOTE — Procedures (Signed)
Department of Anesthesiology  Nerve Block Procedure Note (Single Shot)    Name:  Brett Middleton                                         Age: 81 y.o.  MRN:  [id number]    ALLERGIES: Review of patient's allergies indicates no known allergies.    NERVE BLOCK SPECIFICALLY REQUESTED FOR MANAGEMENT OF PAIN BY: Dr. Catha NottinghamJamison  The Procedure was described to the patient. Alternative options as well as risks and benefits of the peripheral nerve block(s) was discussed. The patient was given the opportunity to ask questions and Informed consent was obtained.    PERIPHERAL NERVE BLOCK TYPE:  left femoral Nerve Block    POSITION: supine    ULTRASOUND GUIDANCE:  yes    NERVE STIMULATOR GUIDED:  Yes.  Appropriate Motor Evoked Response obtained at 1.715mAmp. The motor response was maintained with a current as low as 0.5340mA.    PRIMARY INDICATION:  Perioperative analgesia    H&P STATUS: H&P was reviewed, the patient was examined and no change has occurred in patient's condition since H&P was completed.    DIAGNOSTIC DATA REVIEW:    Lab Results   Component Value Date    PROTIME 11.5 07/31/2015    INR 1.1 07/31/2015       FULL BARRIER PRECAUTIONS & STERILE TECHNIQUE:    As documented on Pre-Procedure Check List  Chlorhexadine    TECHNIQUE: Skin local 1ml 1%Lidocaine. The block was performed with a: 21 gauge, 10 cm, Stimuplex needle.  Injection was made through the needle. Injection was made incrementally with constant monitoring and aspiration every 5 mls.      LOCAL ANESTHETIC: Bolus:0.5 % Ropivicaine   x 30mls      Local anesthetic injected incrementally with intermittent aspiration negative for blood:  yes    No parasthesias reported by patient during injection.    COMPLICATIONS: no       Mena PaulsSteven Mitchell Armanda Forand, DO  10:46 AM

## 2015-08-23 NOTE — H&P (Signed)
I have examined the patient.  I have reviewed the history and physical and find no relevant changes.  I have reviewed with the patient and/or family the risks, benefits, and alternatives to the procedure.

## 2015-08-23 NOTE — Anesthesia Post-Procedure Evaluation (Signed)
Department of Anesthesiology  Post-Anesthesia Note    Name:  Brett Middleton                                         Age:  81 y.o.  MRN:  [id number]     Last Vitals:    Visit Vitals   ??? BP 114/62   ??? Pulse 80   ??? Temp 96.9 ??F (36.1 ??C) (Axillary)   ??? Resp 14   ??? Ht 5\' 11"  (1.803 m)   ??? Wt 195 lb (88.5 kg)   ??? SpO2 96%   ??? BMI 27.2 kg/m2     Patient Vitals for the past 4 hrs:   BP Temp Temp src Pulse Resp SpO2   08/23/15 1546 114/62 96.9 ??F (36.1 ??C) Axillary 80 14 -   08/23/15 1541 - - - - - 96 %   08/23/15 1515 118/77 97.1 ??F (36.2 ??C) Oral 77 16 97 %   08/23/15 1500 112/71 - - 80 16 97 %   08/23/15 1445 101/68 - - 76 16 96 %   08/23/15 1430 97/66 - - 74 21 91 %   08/23/15 1415 101/64 97.5 ??F (36.4 ??C) Temporal 78 12 92 %       Level of Consciousness:  Awake    Respiratory:  Stable    Oxygen Saturation:  Stable    Cardiovascular:  Stable    Hydration:  Adequate    PONV:  Stable    Post-op Pain:  Adequate analgesia    Post-op Assessment:  No apparent anesthetic complications    Additional Follow-Up / Treatment / Comment:  None    Mena PaulsSteven Mitchell Talma Aguillard, DO  August 23, 2015   4:06 PM

## 2015-08-23 NOTE — Op Note (Signed)
Operative Note - Total Knee Arthroplasty      DATE OF PROCEDURE:  08/23/2015     SURGEON:  Baruch Merl, M.D.    ASSISTANT:  Lorenso Quarry, PA-C (no qualified resident available)    PREOPERATIVE DIAGNOSIS:  Osteoarthritis of the Left knee.    POSTOPERATIVE DIAGNOSIS:  Osteoarthritis of the Left knee.    OPERATION:  Left total knee arthroplasty.    ANESTHESIA:   spinal with peripheral nerve block.    HISTORY:  The patient is an 81 y.o. -year-old male  with progressive pain in the Left knee. X-rays and evaluation revealed significant osteoarthritis, bone on bone, and significant peripheral osteophytes.  The patient had tried or considered all conservative options applicable, including injections, anti-inflammatories, bracing, and exercise, without success.  Having failed conservative options, it was felt the patient would benefit from total knee arthroplasty.  The patient was cleared medically and came to the OR on 08/23/2015 .    PROCEDURE:  The patient underwent placement of peripheral nerve block in the holding area, and then came to the operating room where the patient underwent  spinal anesthesia.  The patient was positioned supine on the operative table.  Tourniquet was applied around the Left thigh proximally.  The Left lower extremity was prepped and draped in the usual manner.  We inflated tourniquet to 250 mmHg.    We made a standard midline incision for a medial parapatellar approach to the knee.  We carried incision through the skin and subcutaneous fat to the layer of the joint capsule.  We made a medial parapatellar arthrotomy, which we extended proximally in the mid vastus-type approach. We took down our proximal medial tibial tissue in a subperiosteal manner.  We everted the patella, made our patellar cut.  We sized the patella to a 35 mm patellar component and drilled for the lugs.  We applied patellar protecting plate.   We flexed the knee to sublux the patella laterally.  We excised the menisci and the cruciate ligaments.  We established access to the distal femur via distal drill hole.  We evacuated extraneous extramedullary fat tissue using a sigmoid sucker. We inserted intramedullary alignment device set at 4 degrees of valgus and made a 9 mm distal femoral cut.    We then went to the tibia and made our proximal tibial cut using the extramedullary alignment device.  We put the knee in extension and had good balance with a 13 mm spacer.  We then returned to the femur and sized the distal femur to a size 7 femoral component.  We applied the collateral ligament tensor, tensioned the collateral ligaments, and found that 2 degrees external rotation gave [place] a rectangular flexion gap.  We marked for that, and applied the size 7 cutting block.  We made our posterior condylar cuts.  We applied the spacer block.  We also had a 13 mm flexion gap which matched our extension gap.  We therefore made the remainder of our cuts for the size 7 Left posterior stabilized femoral component.  We went to the tibia and sized the tibia to a size 7  tibia.  We applied the trial femur and inserted the trial tibia with the 13 mm poly trial.  We set rotation for the tibial component and secured the tibial trial.  We then had excellent stability through a  full range of motion, and excellent patella tracking with a 35 mm patella trial in place.  We removed the femoral trial  and prepared for the tibial keel.     We therefore chose this combination of implants.  We inserted a distal bone plug in the femur.  We copiously irrigated all bone cut surfaces and dried all bone cut surfaces in preparation for cementing.  We mixed cement on the back table and cemented in our components, starting with a size 7 tibia, followed by a size 7 Left posterior stabilized femur.  We cleared away cement and put in a 13 mm poly and put the knee in extension to get full  compression of our implants.  We again cleared away cement, and cemented in a 35 mm patella.  We applied the clamp, removed the clamp, removed extraneous cement, and copiously irrigated the knee.      We closed the deep and superficial layers of the vastus medialis using 0 Vicryl suture.  We inserted 2 deep drains, and closed the capsule with #1   Ethibond figure-of-8 suture.  We closed the subcutaneous layer with 2-0   Vicryl inverted suture and the skin with a running 3-0 Monocryl subcuticular stitch. Steri-Strips were applied to the skin.  Sterile dressings applied with Adaptic, 4 x 8???s, sterile Webril and a 6-inch Ace wrap.  Tourniquet was let down at approximately 100 minutes of tourniquet time.  The patient was retrieved from anesthesia and taken to recovery room in good condition, having tolerated the procedure well.  All needle, sponge and instrument counts were correct.    SUMMARY OF IMPLANTS USED:  Stryker Triathlon knee system with a size 7 Left posterior stabilized femur, size 7  tibia, 13 mm posterior stabilized poly, and a 35 mm patella.  We cut the femur at 4 degrees of valgus, 9 mm distal thickness, and 2 degrees external rotation on the femoral component.    Lorenso QuarryKevin Waskin, PA-C, was present for the entire procedure, assisting in   positioning & draping, each step of the procedure, and wound closure.        Baruch MerlJames P Astha Probasco

## 2015-08-24 DIAGNOSIS — I2510 Atherosclerotic heart disease of native coronary artery without angina pectoris: Secondary | ICD-10-CM | POA: Insufficient documentation

## 2015-08-24 DIAGNOSIS — E785 Hyperlipidemia, unspecified: Secondary | ICD-10-CM | POA: Insufficient documentation

## 2015-08-24 DIAGNOSIS — E782 Mixed hyperlipidemia: Secondary | ICD-10-CM | POA: Insufficient documentation

## 2015-08-24 LAB — HEMOGLOBIN AND HEMATOCRIT
Hematocrit: 25.7 % — ABNORMAL LOW (ref 37.0–54.0)
Hemoglobin: 8.9 g/dL — ABNORMAL LOW (ref 12.5–16.5)

## 2015-08-24 MED ORDER — FERROUS SULFATE 325 (65 FE) MG PO TABS
325 (65 Fe) MG | Freq: Every day | ORAL | Status: DC
Start: 2015-08-24 — End: 2015-08-26
  Administered 2015-08-25 – 2015-08-26 (×2): 325 mg via ORAL

## 2015-08-24 MED FILL — CEFAZOLIN SODIUM-DEXTROSE 2-3 GM-% IV SOLR: 2-3 GM-% | INTRAVENOUS | Qty: 2

## 2015-08-24 MED FILL — NORMAL SALINE FLUSH 0.9 % IV SOLN: 0.9 % | INTRAVENOUS | Qty: 20

## 2015-08-24 MED FILL — HYDROCODONE-ACETAMINOPHEN 5-325 MG PO TABS: 5-325 MG | ORAL | Qty: 1

## 2015-08-24 MED FILL — KETOROLAC TROMETHAMINE 30 MG/ML IJ SOLN: 30 MG/ML | INTRAMUSCULAR | Qty: 1

## 2015-08-24 MED FILL — ASPIRIN EC 325 MG PO TBEC: 325 MG | ORAL | Qty: 1

## 2015-08-24 MED FILL — VITAMIN B-12 1000 MCG PO TABS: 1000 MCG | ORAL | Qty: 3

## 2015-08-24 MED FILL — SIMVASTATIN 20 MG PO TABS: 20 MG | ORAL | Qty: 1

## 2015-08-24 MED FILL — DOCUSATE SODIUM 100 MG PO CAPS: 100 MG | ORAL | Qty: 1

## 2015-08-24 MED FILL — DEXAMETHASONE SODIUM PHOSPHATE 10 MG/ML IJ SOLN: 10 MG/ML | INTRAMUSCULAR | Qty: 1

## 2015-08-24 MED FILL — NORMAL SALINE FLUSH 0.9 % IV SOLN: 0.9 % | INTRAVENOUS | Qty: 10

## 2015-08-24 NOTE — Progress Notes (Signed)
Total Joint    Post op Day  1      S:  Complaints: none    O:  Afebrile, Vital signs stable     Dressing Intact   Full range of motion left ankle and toes   Sensation intact to light touch   Able to perform straight leg raise     H/H:   Recent Labs      08/24/15   0406   HGB  8.9*   HCT  25.7*        PT/INR: No results for input(s): PROT, INR in the last 72 hours.                    A/P:  Stable   Acute post op blood loss anemia, stable.  Will add iron replacement.   Proceed with Physical Therapy              D/C Foley catheter              Heplock IV's if PO intake is adequate              ECASA 325 mg daily   Evaluate for Home Discharge versus Rehab

## 2015-08-24 NOTE — Plan of Care (Signed)
Problem: Falls - Risk of  Goal: Absence of falls  Outcome: Ongoing

## 2015-08-24 NOTE — Care Coordination-Inpatient (Signed)
Social Work discharge planning Saturday:  Per unit [employer] Jeanne, this Sw checked for therapy recommendations for discharge planning.  At this time, PT OT notes are pending in Epic.  Social Work to follow for support and discharge planning.   Electronically signed by Becky SaxShannon Marnee Sherrard, LISW on 08/24/2015 at 10:34 AM

## 2015-08-24 NOTE — Progress Notes (Signed)
Physical Therapy  Facility/Department: [facility] ORTHOPAEDIC SURGERY  Initial Assessment    NAME: Brett Middleton  DOB: [birth year]  MRN: [id number]    Date of Service: 08/24/2015    Patient Diagnosis(es):   Patient Active Problem List    Diagnosis Date Noted   ??? Coronary artery disease involving native coronary artery 08/24/2015   ??? Hyperlipidemia 08/24/2015   ??? SSS (sick sinus syndrome) (HCC) 08/24/2015   ??? Primary osteoarthritis of left knee 08/08/2015       Past Medical History   Diagnosis Date   ??? Arthritis      left knee, for or 08/23/2015   ??? History of syncope    ??? Hyperlipidemia      Past Surgical History   Procedure Laterality Date   ??? Colonoscopy     ??? Skin biopsy     ??? Joint replacement Right 07/18/10     hip   ??? Pacemaker placement Left 04/2008   ??? Total knee arthroplasty Left 08/23/2015         Restrictions  Restrictions/Precautions  Restrictions/Precautions: Weight Bearing, General Precautions, Fall Risk  Required Braces or Orthoses?: No  Lower Extremity Weight Bearing Restrictions  Left Lower Extremity Weight Bearing: Weight Bearing As Tolerated  Vision/Hearing  Vision: Within Functional Limits  Hearing: Exceptions to [facility]  Hearing Exceptions: Hard of hearing/hearing concerns (Does not wear aids)     Subjective  General  Chart Reviewed: Yes  Patient assessed for rehabilitation services?: Yes  Family / Caregiver Present: Yes  Follows Commands: Within Functional Limits  Pain Screening  Patient Currently in Pain: Yes  Pain Assessment  Pain Type: Acute pain;Surgical pain  Pain Location: Knee  Pain Orientation: Left  Vital Signs  Patient Currently in Pain: Yes       Orientation  Orientation  Overall Orientation Status: Within Normal Limits  Social/Functional History  Social/Functional History  Lives With: Spouse  Type of Home: House  Home Layout: One level  Home Access: Stairs to enter with rails  Entrance Stairs - Number of Steps: 3  Home Equipment: Cane  ADL Assistance: Independent  Homemaking Assistance:  Independent  Ambulation Assistance: Independent  Transfer Assistance: Independent  Active Driver: Yes  Mode of Transportation: Car  Occupation: Retired  Objective     PROM RLE (degrees)  RLE PROM: WFL  AROM RLE (degrees)  RLE AROM: WNL  R Knee Flexion 0-145:    R Knee Extension 0:    AROM LLE (degrees)  LLE General AROM: Knee flex 85-90*, ext WNL  Strength RLE  Strength RLE: WNL  Strength LLE  Strength LLE: Exception  Comment: Hip 3/5, knee 3/5        Bed Mobility  Rolling: Unable to assess  Supine to Sit: Supervision;Independent  Sit to Supine: Unable to assess  Scooting: Independent  Transfers  Sit to Stand: Contact guard assistance;Stand by assistance  Stand to sit: Stand by assistance;Supervision  Ambulation  Ambulation?: Yes  WB Status: WBAT LTLE  Ambulation 1  Surface: level tile  Device: Rolling Walker  Assistance: Stand by assistance;Contact guard assistance  Quality of Gait: Step through gait, no drifting, buckle or LOB, some light fatigue and dyspnea  Distance: 100'  Stairs/Curb  Stairs?: No              Assessment   Assessment: Decreased functional mobility ;Decreased ADL status;Decreased ROM;Decreased strength;Decreased high-level IADLs;Decreased endurance;Decreased balance  Assessment: Ambulatory dysfunction  Prognosis: Excellent  Requires PT Follow Up: Yes  Activity Tolerance  Activity Tolerance: Patient Tolerated treatment well;Patient limited by fatigue;Patient limited by endurance  PT Equipment Recommendations  Equipment Needed: Yes  Mobility Devices: Lyda Perone: Rolling     Discharge Recommendations:  Home with Home health PT      Plan   Plan  Times per day: Twice a day  Current Treatment Recommendations: Strengthening, ROM, Balance Training, Building services engineer, Teacher, early years/pre, Patent attorney, Teaching laboratory technician, Home Exercise Program, Chief of Staff, Stair training, Investment banker, operational, Mining engineer, Education, Sports administrator, Associate Professor    G-Code       Goals  Short term goals  Time Frame for Short term goals: 2-3 Days  Short term goal 1: Bed mobility indep all levels  Short term goal 2: Transfers all surfaces indep  Short term goal 3: Amb with [employer] indep 400+'  Short term goal 4: Steps x 4 s/Indep with unilat HR assist  Short term goal 5: ROM knee 0-95* and MMT imp 1 grade   Patient Goals   Patient goals : To get home       Therapy Time   Individual Concurrent Group Co-treatment   Time In           Time Out           Minutes                   Lanice Shirts, PT

## 2015-08-24 NOTE — Plan of Care (Signed)
Problem: Falls - Risk of  Goal: Absence of falls  Outcome: Met This Shift  Patient educated on fall and safety precautions. Call light within reach. Hourly rounding continues

## 2015-08-24 NOTE — Progress Notes (Signed)
Department of Anesthesiology  Acute Post Operative Analgesia Service  Daily Clinical Note    Total  left knee arthroplasty POD #1.      Vitals:    08/24/15 1617   BP:    Pulse:    Resp:    Temp:    SpO2: 100%           Pain level: 5/10.    Muscle Strength: 5/5.    Satisfied with postop analgesia provided by anesthesia on day of surgery.  Yes        Sarina IllKeith A Arden Tinoco, MD           08/24/2015       6:43 PM

## 2015-08-24 NOTE — Consults (Addendum)
Consults   Medical Consultation      REASON FOR CONSULTATION:  Post op medical management    ATTENDING/ADMITTING PHYSICIAN:  Baruch Merl, MD    HISTORY OF PRESENT ILLNESS:      The patient is a 81 y.o. male patient of Dr Catha Nottingham is consulted for post op medical management following left TKA for OA.  ROS and charted medical record reviewed with the patient. Additional history includes transient blurring of vision in the left eye. He denies a prior history of stroke disease. He also denies chest pain or SOB.    Past Medical History:    Past Medical History   Diagnosis Date   ??? Arthritis      left knee, for or 08/23/2015   ??? History of syncope    ??? Hyperlipidemia        Past Surgical History:    Past Surgical History   Procedure Laterality Date   ??? Colonoscopy     ??? Skin biopsy     ??? Joint replacement Right 07/18/10     hip   ??? Pacemaker placement Left 04/2008   ??? Total knee arthroplasty Left 08/23/2015       Medications Prior to Admission:    Prescriptions Prior to Admission: Cholecalciferol (VITAMIN D-3 PO), Take by mouth daily  Cyanocobalamin (B-12) 2500 MCG TABS, Take 2,500 mcg by mouth daily   simvastatin (ZOCOR) 20 MG tablet, Take 20 mg by mouth nightly.    Allergies:    Review of patient's allergies indicates no known allergies.    Social History:    reports that he has quit smoking. He quit smokeless tobacco use about 42 years ago. He reports that he does not drink alcohol or use illicit drugs.    Family History:   family history is not on file.    REVIEW OF SYSTEMS:  As above in the HPI, otherwise negative    PHYSICAL EXAM:    Vitals:  Visit Vitals   ??? BP 124/68   ??? Pulse 76   ??? Temp 97.9 ??F (36.6 ??C) (Oral)   ??? Resp 16   ??? Ht  (1.803 m)   ??? Wt 195 lb (88.5 kg)   ??? SpO2 95%   ??? BMI 27.2 kg/m2       General:  Awake, alert, oriented X 3.     HEENT:  Normocephalic, atraumatic.  Pupils equal, round, reactive to light.  No scleral icterus.  No conjunctival injection.  Normal lips, teeth, and gums.  No nasal  discharge.  Neck:  Supple, b/l carotid bruits  Heart:  RRR, no murmurs, gallops, rubs  Lungs:  CTA bilaterally, bilat symmetrical expansion, no wheeze, rales, or rhonchi  Abdomen:  Bowel sounds present, soft, nontender, no masses, no organomegaly, no peritoneal signs  Extremities:  No clubbing, cyanosis, or edema, dressing left knee  Skin:  Warm and dry, no open lesions or rash  Neuro:  Cranial nerves 2-12 intact, no focal deficits  Rectal: deferred  Genitalia:  deferred    LABS:    CBC with Differential:    Lab Results   Component Value Date    WBC 4.4 08/21/2015    RBC 4.06 08/21/2015    HGB 8.9 08/24/2015    HCT 25.7 08/24/2015    PLT 145 08/21/2015    MCV 91.2 08/21/2015    MCH 31.7 08/21/2015    MCHC 34.7 08/21/2015    RDW 18.5 08/21/2015    LYMPHOPCT 21 07/31/2015  MONOPCT 10 07/31/2015    BASOPCT 0 07/31/2015    MONOSABS 0.50 07/31/2015    LYMPHSABS 1.03 07/31/2015    EOSABS 0.07 07/31/2015    BASOSABS 0.02 07/31/2015     CMP:    Lab Results   Component Value Date    NA 136 08/20/2015    K 4.5 08/20/2015    CL 100 08/20/2015    CO2 25 08/20/2015    BUN 23 08/20/2015    CREATININE 1.0 08/20/2015    GFRAA >60 08/20/2015    LABGLOM >60 08/20/2015    GLUCOSE 90 08/20/2015    CALCIUM 8.6 08/20/2015       ASSESSMENT:      Active Hospital Problems    Diagnosis Date Noted   ??? Coronary artery disease involving native coronary artery [I25.10] 08/24/2015   ??? Hyperlipidemia [E78.5] 08/24/2015   ??? SSS (sick sinus syndrome) (HCC) [I49.5] 08/24/2015   ??? Primary osteoarthritis of left knee [M17.12] 08/08/2015       PLAN:  Continue present meds               Patient advise on discharge to follow with PCP on carotid bruits and necessary w/u               Continue aspirin dose         Aloha GellJohn P Viraat Vanpatten, DO  10:09 AM  08/24/2015

## 2015-08-24 NOTE — Care Coordination-Inpatient (Addendum)
Social Work discharge planning addendum-   Noted PT rec [facility] today.  [facility] [phone] who confirmed they already have referral for possible discharge Sunday or Monday.  Pt/wife confirmed he already has a ww and bsc at home.  Electronically signed by Becky SaxShannon Demiah Gullickson, LISW on 08/24/2015 at 1:22 PM

## 2015-08-24 NOTE — Other (Signed)
[facility] Quality Flow/Interdisciplinary Rounds Progress Note        Quality Flow Rounds held on August 24, 2015    Disciplines Attending:  Bedside Nurse and Nursing Unit Leadership    Margarita Ranadward Lombardo was admitted on 08/23/2015 11:00 AM    Anticipated Discharge Date:  Expected Discharge Date: 08/26/15    Disposition:    Braden Score:  Braden Scale Score: 20    Readmission Risk              Readmission Risk:        4.25       Age 565 or Greater:  1    Admitted from SNF or Requires Paid or Family Care:  0    Currently has CHF,COPD,ARF,CRI,or is on dialysis:  0    Takes more than 5 Prescription Medications:  0    Takes Digoxin,Insulin,Anticoagulants,Narcotics or ASA/Plavix:  2    Hospital Admit in Past 12 Months:  0    On Disability:  0    Patient Considers own Health:  1.25          Discussed patient goal for the day, patient clinical progression, and barriers to discharge.  The following Goal(s) of the Day/Commitment(s) have been identified:  Pain control. Safety. Therapy to work with patient. Discharge planning.       GrenadaBrittany Boosinger  August 24, 2015

## 2015-08-25 LAB — HEMOGLOBIN AND HEMATOCRIT
Hematocrit: 23.6 % — ABNORMAL LOW (ref 37.0–54.0)
Hemoglobin: 8.1 g/dL — ABNORMAL LOW (ref 12.5–16.5)

## 2015-08-25 MED ORDER — ASPIRIN 325 MG PO TBEC
325 MG | ORAL_TABLET | Freq: Every day | ORAL | 0 refills | Status: AC
Start: 2015-08-25 — End: 2015-10-04

## 2015-08-25 MED ORDER — HYDROCODONE-ACETAMINOPHEN 5-325 MG PO TABS
5-325 MG | ORAL_TABLET | ORAL | 0 refills | Status: AC | PRN
Start: 2015-08-25 — End: 2015-09-01

## 2015-08-25 MED ORDER — HYDROCODONE-ACETAMINOPHEN 5-325 MG PO TABS
5-325 MG | ORAL | Status: DC | PRN
Start: 2015-08-25 — End: 2015-08-26

## 2015-08-25 MED ORDER — TRAMADOL HCL 50 MG PO TABS
50 MG | ORAL | Status: DC | PRN
Start: 2015-08-25 — End: 2015-08-26

## 2015-08-25 MED ORDER — ACETAMINOPHEN 325 MG PO TABS
325 MG | ORAL_TABLET | ORAL | 3 refills | Status: AC | PRN
Start: 2015-08-25 — End: ?

## 2015-08-25 MED ORDER — TRAMADOL HCL 50 MG PO TABS
50 MG | ORAL_TABLET | ORAL | 1 refills | Status: AC | PRN
Start: 2015-08-25 — End: 2015-09-04

## 2015-08-25 MED ORDER — FERROUS SULFATE 325 (65 FE) MG PO TABS
325 (65 Fe) MG | ORAL_TABLET | Freq: Every day | ORAL | 0 refills | Status: AC
Start: 2015-08-25 — End: 2015-09-24

## 2015-08-25 MED FILL — SIMVASTATIN 20 MG PO TABS: 20 MG | ORAL | Qty: 1

## 2015-08-25 MED FILL — ASPIRIN EC 325 MG PO TBEC: 325 MG | ORAL | Qty: 1

## 2015-08-25 MED FILL — FERROUS SULFATE 325 (65 FE) MG PO TABS: 325 (65 Fe) MG | ORAL | Qty: 1

## 2015-08-25 MED FILL — VITAMIN B-12 1000 MCG PO TABS: 1000 MCG | ORAL | Qty: 3

## 2015-08-25 MED FILL — NORMAL SALINE FLUSH 0.9 % IV SOLN: 0.9 % | INTRAVENOUS | Qty: 10

## 2015-08-25 MED FILL — DOCUSATE SODIUM 100 MG PO CAPS: 100 MG | ORAL | Qty: 1

## 2015-08-25 MED FILL — HYDROCODONE-ACETAMINOPHEN 5-325 MG PO TABS: 5-325 MG | ORAL | Qty: 1

## 2015-08-25 NOTE — Progress Notes (Signed)
During my first rounds, patient was sitting in the chair, I asked who had gotten the patient out of bed and the patient stated "I did myself" the patient seemed slightly confused at the time. Patient was cleaned up by Missy, and a chair alarm was placed under the patient.  I educated the patient on not getting out of bed without assistance from staff. Patient states understanding.

## 2015-08-25 NOTE — Progress Notes (Signed)
Patient voided 100cc, post void residual 59.

## 2015-08-25 NOTE — Progress Notes (Signed)
Physical Therapy      S: Reporting did not get any sleep last night.  Pain "ok".  Is HOH in B/L and does not have aids.    O: Bed mob: Min assist  Sit bal: Indep  Transfer: CGA/Min x 1  Amb: 200+' with WW (self-limited dist)  ROM: Flex 90-95*, Ext -25*--10*P  MMT: knee 3-/5, ankle 5/5  There-ex: LTLE, manual ROM, HEP    A: Appears and objectively deflated mobility today Vs prior day.  Gait pattern more labored with step through without drifting or LOB/buckle.  Self-limited distance due to fatigue with overt fatigue behaviors observed;gait pattern more unstable with limited LE clearances floor/advanced fall risk.   Knee ext -30* but was able to imp to -10* passive stretch, QAS and positional placement with chair seat pan support for footrest.  Educated on using roll under distal LE while in bed.  Unable to assess stair negotiation due to inability to reach step area due to fatigue.      P: Cont. POC as tol  SAR on D/C at this point

## 2015-08-25 NOTE — Plan of Care (Signed)
Problem: Falls - Risk of  Goal: Absence of falls  Outcome: Met This Shift  Patient educated on fall and safety precautions. Call light within reach. Hourly rounding continues. Bed alarm on.

## 2015-08-25 NOTE — Progress Notes (Signed)
Chief Complaint:  Urine frequency/knee pain    Subjective:  The patient is alert. Urine frequency   Denies chest pain & SOB .  Denies abdominal pain.  Tolerating diet.  No nausea or vomiting.    Objective:    Vitals:    08/25/15 0850   BP:    Pulse:    Resp:    Temp:    SpO2: 97%     A&O x's 3  Heart:  RRR, no murmurs, gallops, or rubs.  Lungs:  CTA bilaterally, no wheeze, rales or rhonchi  Abd: bowel sounds present, nontender, nondistended, no masses  Extrem:  No clubbing, cyanosis, or edema, dressing left knee      Lab Results   Component Value Date    NA 136 08/20/2015    K 4.5 08/20/2015    CL 100 08/20/2015    CO2 25 08/20/2015    BUN 23 08/20/2015    CREATININE 1.0 08/20/2015    CALCIUM 8.6 08/20/2015      Lab Results   Component Value Date    WBC 4.4 08/21/2015    RBC 4.06 08/21/2015    HGB 8.1 08/25/2015    HCT 23.6 08/25/2015    MCV 91.2 08/21/2015    MCH 31.7 08/21/2015    MCHC 34.7 08/21/2015    RDW 18.5 08/21/2015    PLT 145 08/21/2015    MPV 7.6 08/21/2015     PT/INR:    Lab Results   Component Value Date    PROTIME 11.5 07/31/2015    INR 1.1 07/31/2015     No results for input(s): POCGLU in the last 72 hours.    No results for input(s): NA, K, CL, CO2, BUN, LABALBU, CREATININE, CALCIUM, GFRAA, LABGLOM, GLUCOSE in the last 72 hours.    Invalid input(s): GLU    No results found.    Scheduled Meds:  ??? ferrous sulfate  325 mg Oral Daily with breakfast   ??? sodium chloride flush  10 mL Intravenous 2 times per day   ??? docusate sodium  100 mg Oral BID   ??? aspirin  325 mg Oral Daily   ??? simvastatin  20 mg Oral Nightly   ??? vitamin B-12  2,500 mcg Oral Daily     Continuous Infusions:  ??? sodium chloride Stopped (08/24/15 1300)     PRN Meds:.sodium chloride flush, acetaminophen, ondansetron, HYDROcodone 5 mg - acetaminophen **OR** HYDROcodone 5 mg - acetaminophen, HYDROmorphone    I/O last 3 completed shifts:  In: 2726 [P.O.:1300; I.V.:1426]  Out: 1075 [Urine:975; Drains:100]       Intake/Output Summary (Last 24  hours) at 08/25/15 0916  Last data filed at 08/25/15 [id number]   Gross per 24 hour   Intake             2726 ml   Output             1075 ml   Net             1651 ml       Assessment:    Active Hospital Problems    Diagnosis Date Noted   ??? Coronary artery disease involving native coronary artery [I25.10] 08/24/2015   ??? Hyperlipidemia [E78.5] 08/24/2015   ??? SSS (sick sinus syndrome) (HCC) [I49.5] 08/24/2015   ??? Primary osteoarthritis of left knee [M17.12] 08/08/2015       Plan:  Bladder scan for retention             Declines  Flomax Rx("didn't work"in the past)          Elvina Sidle, DO  9:16 AM  08/25/2015

## 2015-08-25 NOTE — Progress Notes (Addendum)
During assessment I asked patient if he knew where he was and he stated "I do now, i got a little confused earlier."  I again educated the patient on not getting up without assistance, patient states "I promise, i'm not going anywhere".  Patient A&Ox3 and sitting comfortably in the chair, chair alarm on. Will continue to assess.

## 2015-08-25 NOTE — Progress Notes (Signed)
Physical Therapy      Physical therapy daily BID treat attempted. Patient sleeping and wife requesting hold until AM to allow him to sleep/rest.   ????

## 2015-08-25 NOTE — Plan of Care (Signed)
Problem: Falls - Risk of  Goal: Absence of falls  Outcome: Met This Shift  Patient remains free from falls. Patient educated on fall precautions. Bed in lowest position, wheels locked, call light within reach. Will continue hourly rounding.

## 2015-08-25 NOTE — Progress Notes (Signed)
Occupational Therapy   Occupational Therapy Initial Assessment  Date: 08/25/2015   Patient Name: Brett Middleton  MRN: [id number]     DOB: 05/09/1931           Restrictions  Restrictions/Precautions  Restrictions/Precautions: Fall Risk, Weight Bearing  Required Braces or Orthoses?: No  Lower Extremity Weight Bearing Restrictions  Left Lower Extremity Weight Bearing: Weight Bearing As Tolerated    Subjective   General  Chart Reviewed: Yes  Patient assessed for rehabilitation services?: Yes  Family / Caregiver Present: No  Diagnosis: patient admitted secondary to knee surger  Pain Assessment  Patient Currently in Pain: Denies  Pain Assessment: 0-10  Pain Level: 3  Pain Type: Surgical pain  Pain Location: Knee  Pain Orientation: Left    Social/Functional History  Social/Functional History  Lives With: Spouse  Type of Home: House  Home Layout: One level  Home Access: Stairs to enter with rails (two step entry)  Entrance Stairs - Number of Steps: 3  Bathroom Shower/Tub: Tub/Shower unit (shower stall in basement)  Bathroom Toilet: Standard  Bathroom Equipment: Grab bars in shower, Buyer, retailhower chair  Home Equipment: Agricultural consultantolling walker, Medical laboratory scientific officerCane  ADL Assistance: Independent  Homemaking Assistance: Independent  Homemaking Responsibilities: Yes  Meal Prep Responsibility: Marketing executiveecondary  Laundry Responsibility: Secondary  Cleaning Responsibility: Secondary  Ambulation Assistance: Independent  Transfer Assistance: Independent  Active Driver: Yes  Mode of Transportation: Set designerCar  Occupation: Retired       Presenter, broadcastingbjective   Vision: Within Systems developerunctional Limits  Hearing: Exceptions to Centex CorporationWFL  Hearing Exceptions: Hard of hearing/hearing concerns    Orientation  Overall Orientation Status: Within Functional Limits     Balance  Sitting Balance: Independent (seated edge of bed)  Copywriter, advertisingToilet Transfers  Toilet - Technique: Ambulating  Equipment Used: Raised toilet seat with rails  Transfer Level: Stand by assistance  Toilet Transfer: Stand by assistance  ADL  Feeding: Modified  independent   Grooming: Supervision  UE Bathing: Supervision  LE Bathing: Minimal assistance  UE Dressing: Supervision  LE Dressing: Minimal assistance  Toileting: Supervision                    Perception  Overall Perceptual Status: WFL  Sensation  Overall Sensation Status: Impaired (bilateral feet)        LUE PROM (degrees)  LUE PROM: WFL  LUE AROM (degrees)  LUE AROM : WFL  Left Hand PROM (degrees)  Left Hand PROM: WFL  Left Hand AROM (degrees)  Left Hand AROM: WFL  RUE PROM (degrees)  RUE PROM: WFL  RUE AROM (degrees)  RUE AROM : WFL  Right Hand PROM (degrees)  Right Hand PROM: WFL  Right Hand AROM (degrees)  Right Hand AROM: WFL  LUE Strength  Gross LUE Strength: WFL  L Hand Grasp: 4/5  L Hand Release: 4/5  RUE Strength  Gross RUE Strength: WFL  R Hand Grasp: 4/5  R Hand Release: 4/5                  Assessment   Conditions Requiring Skilled Therapeutic Intervention : Decreased functional mobility ;Decreased ADL status;Decreased strength;Decreased endurance  Prognosis: Fair  Patient Education: patient educated on occupational therapy and treatment plan.  patient able to verbalize understanding  Discharge Recommendations: Home with Home health OT  Requires OT Follow Up: Yes  Activity Tolerance  Activity Tolerance: Patient Tolerated treatment well  Safety Devices  Safety Devices in place: Yes  Type of devices: Call light within reach;Left in chair  OT Equipment Recommendations  Other: adl adaptive equipment        Discharge Recommendations:  Home with Home health OT     Plan   Plan  Times per week: 5x/wk  prn  Times per day: Daily  Current Treatment Recommendations: Strengthening, Location manager, Building services engineer, Teaching laboratory technician, Mining engineer, Education, Sports administrator, Interior and spatial designer, Self-Care / ADL, Equities trader, Safety Education & Training    G-Code  OT G-codes  Functional Limitation: Self care  Self Care Current Status [phone]): At least 20 percent  but less than 40 percent impaired, limited or restricted  Self Care Goal Status ([id number]): At least 1 percent but less than 20 percent impaired, limited or restricted    Goals  1. Patient will perform functional transfers at mod ind level with dme as needed with good safety within two week    2. Patient will perform grooming at mod ind level within one week    3. Patient will perform ue bathing/dressing at mod ind within precautions within one week    4. Patient will perform le bathing/dressing at mod ind with adaptive equipment as needed within two weeks    5. Patient will perform functional mobility and activity with good safety within two weeks.    6. Patient will increase bilateral ue strength within available movement range by 1/3 grade of mmg within two weeks.      Patient/Family stated goal:  To return home    Educational Needs:    Patient will benefit from further education on general safety precautions during daily life tasks, adl re education, functional transfer training, adl adaptive equipment and dme education, and patient family education.        Baxter Flattery., OTR/L  [phone]

## 2015-08-25 NOTE — H&P (Signed)
I have examined the patient.  I have reviewed the history and physical and find no relevant changes.

## 2015-08-25 NOTE — Progress Notes (Signed)
[facility]    Post op Day 2      S:  Complaints: none    O:  Afebrile, Vital signs stable     Dressing Intact.  Some drainage from blisters under the steri-strips along the incision.   Full range of motion left ankle and toes   Sensation intact to light touch     H/H:   Recent Labs      08/25/15   0359   HGB  8.1*   HCT  23.6*        PT/INR: No results for input(s): PROT, INR in the last 72 hours.    A/P:  Stable   Proceed with Physical Therapy              Start daily dressing changes              Acute post-op blood loss anemia-  Stable.  On Iron replacement              ECASA 325 mg daily for 6 weeks   Home vs Rehab tomorrow, depending on progress.

## 2015-08-25 NOTE — Progress Notes (Deleted)
Physical Therapy         Physical therapy daily BID treat attempted. Patient sleeping and wife requesting hold until AM to allow him to sleep/rest.   ??   ??

## 2015-08-26 LAB — TYPE AND SCREEN
ABO/Rh: B POS
Antibody Screen: NEGATIVE

## 2015-08-26 LAB — HEMOGLOBIN AND HEMATOCRIT
Hematocrit: 21.2 % — ABNORMAL LOW (ref 37.0–54.0)
Hemoglobin: 7.4 g/dL — ABNORMAL LOW (ref 12.5–16.5)

## 2015-08-26 MED ORDER — SODIUM CHLORIDE 0.9 % IV BOLUS
0.9 % | Freq: Once | INTRAVENOUS | Status: DC
Start: 2015-08-26 — End: 2015-08-26
  Administered 2015-08-26: 15:00:00 250 mL via INTRAVENOUS

## 2015-08-26 MED FILL — DOCUSATE SODIUM 100 MG PO CAPS: 100 MG | ORAL | Qty: 1

## 2015-08-26 MED FILL — SIMVASTATIN 20 MG PO TABS: 20 MG | ORAL | Qty: 1

## 2015-08-26 MED FILL — DIPRIVAN 200 MG/20ML IV EMUL: 200 MG/20ML | INTRAVENOUS | Qty: 20

## 2015-08-26 MED FILL — FERROUS SULFATE 325 (65 FE) MG PO TABS: 325 (65 Fe) MG | ORAL | Qty: 1

## 2015-08-26 MED FILL — GLYCOPYRROLATE 0.2 MG/ML IJ SOLN: 0.2 MG/ML | INTRAMUSCULAR | Qty: 1

## 2015-08-26 MED FILL — LIDOCAINE HCL (CARDIAC) 20 MG/ML IV SOLN: 20 MG/ML | INTRAVENOUS | Qty: 5

## 2015-08-26 MED FILL — ONDANSETRON HCL 4 MG/2ML IJ SOLN: 4 MG/2ML | INTRAMUSCULAR | Qty: 2

## 2015-08-26 MED FILL — NORMAL SALINE FLUSH 0.9 % IV SOLN: 0.9 % | INTRAVENOUS | Qty: 10

## 2015-08-26 MED FILL — VITAMIN B-12 1000 MCG PO TABS: 1000 MCG | ORAL | Qty: 3

## 2015-08-26 MED FILL — SODIUM CHLORIDE 0.9 % IJ SOLN: 0.9 % | INTRAMUSCULAR | Qty: 10

## 2015-08-26 MED FILL — EPHEDRINE SULFATE 50 MG/ML IJ SOLN: 50 MG/ML | INTRAMUSCULAR | Qty: 1

## 2015-08-26 NOTE — Progress Notes (Signed)
Spoke to Dr. Catha NottinghamJamison regarding pt refusing aspirin. Dr. Catha NottinghamJamison states that if pt doesn't want to take aspirin that Dr. Catha NottinghamJamison will start him on coumadin but to talk to pt and let pt decide if he would rather take aspirin or coumadin. Dr. Catha NottinghamJamison also stated that pt can be discharge after 1 unit of RBCs with no repeat H&H as long a PT/OT says its okay for him to go home.  I then spoke to the pt and his wife regarding the pt's medication option and both the pt and his wife decided to stay with aspirin instead of Coumadin.

## 2015-08-26 NOTE — Plan of Care (Signed)
Problem: Falls - Risk of  Goal: Absence of falls  Outcome: Met This Shift

## 2015-08-26 NOTE — Progress Notes (Signed)
Physical Therapy  Facility/Department: [facility]  Daily Treatment Note  NAME: Brett Middleton  DOB: 12-Jul-1931  MRN: [id number]    Date of Service: 08/26/2015    Patient Diagnosis(es):   Patient Active Problem List    Diagnosis Date Noted   ??? Coronary artery disease involving native coronary artery 08/24/2015   ??? Hyperlipidemia 08/24/2015   ??? SSS (sick sinus syndrome) (HCC) 08/24/2015   ??? Primary osteoarthritis of left knee 08/08/2015       Past Medical History   Diagnosis Date   ??? Arthritis      left knee, for or 08/23/2015   ??? History of syncope    ??? Hyperlipidemia      Past Surgical History   Procedure Laterality Date   ??? Colonoscopy     ??? Skin biopsy     ??? Joint replacement Right 07/18/10     hip   ??? Pacemaker placement Left 04/2008   ??? Total knee arthroplasty Left 08/23/2015       Recommendation for discharge: HH PT with family support    Room #: 719  DIAGNOSIS: L Knee OA, S/P L TKA  PRECAUTIONS: Falls, Safety, WBAT L LE    Pt seen second time today, is agreeable to treatment. Wife present, educated on transfer safety and stair negotiation. Wife states has a Network engineereighbor who can help Pt into the house as needed, [place] help [place] well.    Pain level is: No complaint during gait, states painful when sitting and knee is flexed  Balance: fair dynamic using WW for support     Functional Mobility  Bed Mobility  Rolling: NT  Supine to sit: Supervision  Sit to supine: NT  Scooting: NT     Transfers  Sit to stand: Supervision  Stand to sit: Supervision  Stand pivot: Supervision using WW for support     Locomotion  Ambulation: 150 feet x 2 reps WBAT L LE with Supervision for balance using Clorox CompanyWW device. Step through reciprocal pattern used  Stair negotiation: ascended and descended 4 steps with rail and standard cane for support SBA, step to pattern used, instruction/demo given prior to Wife. Pt ascended/descended 6" step with Clorox CompanyWW for support SBA. Wife states 1 step to get into the  kitchen      Pt found in bed and remained in a bedside chair after rx,  call light left by patient.  Pt is making good progress towards physical therapy goals as per functional mobility performed. Pt displays functional mobility needed to go home with family assistance  Continue with physical therapy    Time in: 1423  Time out: [id number]    Grabiela Wohlford PTA [phone]

## 2015-08-26 NOTE — Progress Notes (Signed)
Spoke to Dr. Gardiner FantiPopovec and he is ok with patient leaving today.

## 2015-08-26 NOTE — Progress Notes (Signed)
Chief Complaint:  Urine frequency/knee pain    Subjective:  The patient is alert. Urine frequency ongoing.   Denies chest pain & SOB .  Denies abdominal pain.  Tolerating diet.  No nausea or vomiting.    Objective:    Vitals:    08/26/15 0219   BP: 131/63   Pulse: 100   Resp: 16   Temp: 98.6 ??F (37 ??C)   SpO2:      A&O x's 3  Heart:  RRR, no murmurs, gallops, or rubs.  Lungs:  CTA bilaterally, no wheeze, rales or rhonchi  Abd: bowel sounds present, nontender, nondistended, no masses  Extrem:  No clubbing, cyanosis, or edema, dressing left knee      Lab Results   Component Value Date    NA 136 08/20/2015    K 4.5 08/20/2015    CL 100 08/20/2015    CO2 25 08/20/2015    BUN 23 08/20/2015    CREATININE 1.0 08/20/2015    CALCIUM 8.6 08/20/2015      Lab Results   Component Value Date    WBC 4.4 08/21/2015    RBC 4.06 08/21/2015    HGB 7.4 08/26/2015    HCT 21.2 08/26/2015    MCV 91.2 08/21/2015    MCH 31.7 08/21/2015    MCHC 34.7 08/21/2015    RDW 18.5 08/21/2015    PLT 145 08/21/2015    MPV 7.6 08/21/2015     PT/INR:    Lab Results   Component Value Date    PROTIME 11.5 07/31/2015    INR 1.1 07/31/2015     No results for input(s): POCGLU in the last 72 hours.    No results for input(s): NA, K, CL, CO2, BUN, LABALBU, CREATININE, CALCIUM, GFRAA, LABGLOM, GLUCOSE in the last 72 hours.    Invalid input(s): GLU    No results found.    Scheduled Meds:  ??? sodium chloride  250 mL Intravenous Once   ??? ferrous sulfate  325 mg Oral Daily with breakfast   ??? sodium chloride flush  10 mL Intravenous 2 times per day   ??? docusate sodium  100 mg Oral BID   ??? aspirin  325 mg Oral Daily   ??? simvastatin  20 mg Oral Nightly   ??? vitamin B-12  2,500 mcg Oral Daily     Continuous Infusions:  ??? sodium chloride Stopped (08/24/15 1300)     PRN Meds:.[DISCONTINUED] HYDROcodone 5 mg - acetaminophen **OR** HYDROcodone 5 mg - acetaminophen, traMADol, sodium chloride flush, acetaminophen, ondansetron, HYDROmorphone    I/O last 3 completed shifts:  In:  1180 [P.O.:1180]  Out: 2080 [Urine:2080]  I/O this shift:  In: -   Out: 925 [Urine:925]    Intake/Output Summary (Last 24 hours) at 08/26/15 [id number]  Last data filed at 08/26/15 [id number]   Gross per 24 hour   Intake              360 ml   Output             2455 ml   Net            -2095 ml       Assessment:    Active Hospital Problems    Diagnosis Date Noted   ??? Coronary artery disease involving native coronary artery [I25.10] 08/24/2015   ??? Hyperlipidemia [E78.5] 08/24/2015   ??? SSS (sick sinus syndrome) (HCC) [I49.5] 08/24/2015   ??? Primary osteoarthritis of left knee [M17.12] 08/08/2015  Plan:   Bladder scan neg for retention              Cardiac and pulmonary status stable              Transfuse with pRBC's               Discharge today    Aloha Gell Reine Bristow, DO  6:42 AM  08/26/2015

## 2015-08-26 NOTE — Discharge Summary (Signed)
Physician Discharge Summary     Patient ID:  Brett Middleton  [id number]  81 y.o.  25-Nov-1930    Admit date: 08/23/2015    Discharge date and time: 08/26/2015  5:15 PM     Admitting Physician: Baruch MerlJames P Jamison, MD     Surgeon: Baruch MerlJames P. Jamison, M.D.    Assistant: [name], PA-C    Anesthesia: spinal with Fem block    Discharge Physician: Dr. Baruch MerlJames P. Jamison    Admission Diagnoses: OSTEOARTHRITIS    Discharge Diagnoses: left Total Knee Arthroplasty         Acute post-op blood loss anemia      Discharged Condition: stable    Hospital Course:               POD#1 stable, VSS, doing ok, NVSI, ROM ok, dsg c/d/i, labs stable, PT initiated, rehab consult made              POD#2 stable, VSS, doing ok, NVSI, ROM ok, dsg c/d/i, labs stable              POD#3 stable, VSS, doing ok, NVSI, ROM ok, dsg c/d/i, H/H low, Transfused 1 unit pRBC's, stable for home d/c    Consults: rehabilitation medicine, PCP    Significant Diagnostic Studies:     Recent Labs   ???? 08/24/15  0406   HGB 8.9*   HCT 25.7*   ??  08/25/15  0359    HGB 8.1*   HCT 23.6*   ??  08/26/15  0224    HGB 7.4*   HCT 21.2*     Xray: none    Treatments: Standard post-op    Disposition: home    Patient Instructions: May shower on POD#5 if wound remains clean and dry     Continue ASA x 6 wks for DVT prophylaxis     Activity: activity as tolerated    Diet: regular diet     Wound Care: keep wound clean and dry    Follow-up in the office at 3 weeks post-op    Signed:  [name]  09/03/2015  12:40 PM

## 2015-08-26 NOTE — Progress Notes (Signed)
Physical Therapy  Facility/Department: [facility]  Daily Treatment Note  NAME: Brett Middleton  DOB: 1931/07/17  MRN: [id number]    Date of Service: 08/26/2015    Patient Diagnosis(es):   Patient Active Problem List    Diagnosis Date Noted   ??? Coronary artery disease involving native coronary artery 08/24/2015   ??? Hyperlipidemia 08/24/2015   ??? SSS (sick sinus syndrome) (HCC) 08/24/2015   ??? Primary osteoarthritis of left knee 08/08/2015       Past Medical History   Diagnosis Date   ??? Arthritis      left knee, for or 08/23/2015   ??? History of syncope    ??? Hyperlipidemia      Past Surgical History   Procedure Laterality Date   ??? Colonoscopy     ??? Skin biopsy     ??? Joint replacement Right 07/18/10     hip   ??? Pacemaker placement Left 04/2008   ??? Total knee arthroplasty Left 08/23/2015       Recommendation for discharge: HH PT with family support    Room #: 719  DIAGNOSIS: L Knee OA, S/P L TKA  PRECAUTIONS: Falls, Safety, WBAT L LE    Pt is agreeable to treatment.    Pain level is: Moderate L knee during exercise  Balance: fair dynamic using WW for support     Functional Mobility  Bed Mobility  Rolling: NT  Supine to sit: Supervision  Sit to supine: Supervision  Scooting: NT     Transfers  Sit to stand: Supervision  Stand to sit: Supervision  Stand pivot: Supervision using WW for support     Locomotion  Ambulation: 180 feet x 1 reps WBAT L LE with Supervision for balance using [employer] device. Step through reciprocal pattern used  Stair negotiation: ascended and descended 4 steps x 2 with both rails with SBA/Supervision, 4 steps with rail and standard cane for support SBA, step to pattern used, instruction/demo given prior      Pt performed therapeutic exercise of the following: seated ankle pumps, L LE quad sets AROM; L LE LAQ's and towel slides AROM x 20. Exercise part of written HEP. Pt able to achieve 85 degrees knee flexion L LE with towel slide motions.    Pt found in rehab gym with OTA, remained in a bedside  chair after rx, chair alarm active, call light left by patient.  Pt is making good progress towards physical therapy goals as per functional mobility performed.  Continue with physical therapy    Time in: 0826  Time out: 0900    Antony OdeaGrant Joden Bonsall PTA [phone]

## 2015-08-26 NOTE — Care Coordination-Inpatient (Signed)
Social work:    Confirmed home plans with Mr. Morton AmySescourka today. Patient has a walker/3-1 commode and prefers [facility]. [facility] following for discharge home later today.    Electronically signed by Lynita LombardJeanne Joushua Dugar, LSW on 08/26/2015 at 9:57 AM

## 2015-08-26 NOTE — Progress Notes (Signed)
Occupational Therapy  OCCUPATIONAL THERAPY DAILY NOTE    Date:08/26/2015  Patient Name: Brett Middleton  MRN: [id number]  DOB: 1931/07/06  Room: 0719/0719-A     Patient Active Problem List   Diagnosis   ??? Primary osteoarthritis of left knee   ??? Coronary artery disease involving native coronary artery   ??? Hyperlipidemia   ??? SSS (sick sinus syndrome) (HCC)       Subjective:  Pt pleasant and cooperative.   Precautions: falls, wbat  Chart Reviewed:  Yes          Independent Supervision Contact Guard Assist Minimal Assist Moderate Assist Maximum Assist Dependent   Feeding          Grooming          UE  Bathing          LE Bathing          UE Dressing          LE  Dressing                    Toileting                   Comments:  Pt donned pants with increased time after set up.  Assistance required for socks and shoes.  Pt reports that his wife can assist with socks and shoes and declined need for adaptive equipment at this time.      Functional Transfers:  Transfers from toilet and chair surfaces SBA.  Min cues for hand placement to increase safety and technique.  Functional mobility using wheeled walker supervision.  Walk in shower transfer SBA after instruction.  Pt remained with PT at end of the session.     Therapeutic Exercises:  Good use of UE's for support while using walker.     Other:  Fair tolerance for activity.  Min cues for safety. Pt anticipating discharge to home today.     Education:  Dan HumphreysWalker and Personal assistanttransfer safety, home safety    Equipment Recommendations:  3-in-1  Placement Recommendations:  Home with assist as needed     Pain Level: 2/10   Additional Notes:    Patient has made good progress during treatment sessions toward set goals.   [x]  Continue with current OT Plan of care.  []  Prepare for Discharge    Candee FurbishSeari Lynn Arbadella Kimbler OTA/L [phone]    Total Tx Time: 20

## 2015-08-26 NOTE — Other (Addendum)
Patient Acct Nbr:  [id number]  Primary AUTH/CERT:  [id number]  Primary Insurance Company Name:   [facility]  Primary Insurance Plan Name:  [employer] [facility]  Primary Insurance Group Number:  [id number]  Primary Insurance Plan Type: C  Primary Insurance Policy Number:  [id number]

## 2015-08-26 NOTE — Progress Notes (Signed)
[facility] Quality Flow/Interdisciplinary Rounds Progress Note        Quality Flow Rounds held on August 26, 2015    Disciplines Attending:  Bedside Nurse, Social Worker, Case Manager and Nursing Unit Leadership    Brett Middleton was admitted on 08/23/2015 11:00 AM    Anticipated Discharge Date:  Expected Discharge Date: 08/26/15    Disposition:    Braden Score:  Braden Scale Score: 20    Readmission Risk              Readmission Risk:        4.25       Age 79 or Greater:  1    Admitted from SNF or Requires Paid or Family Care:  0    Currently has CHF,COPD,ARF,CRI,or is on dialysis:  0    Takes more than 5 Prescription Medications:  0    Takes Digoxin,Insulin,Anticoagulants,Narcotics or ASA/Plavix:  2    Hospital Admit in Past 12 Months:  0    On Disability:  0    Patient Considers own Health:  1.25          Discussed patient goal for the day, patient clinical progression, and barriers to discharge.  The following Goal(s) of the Day/Commitment(s) have been identified:  Discharge Planning      Brett Middleton  August 26, 2015

## 2015-08-26 NOTE — Progress Notes (Signed)
Called Dr. Catha NottinghamJamison via office. Spoke to [place], she stated that she will leave him a message and to call back with any questions.

## 2015-08-26 NOTE — Progress Notes (Addendum)
Total Joint    Post op Day 3      S:  Complaints: none    O:  Afebrile, Vital signs stable.  Pt is awake and alert this AM.     Dressing Intact.  Several blisters distally.  Look OK.   Full range of motion left ankle and toes   Sensation intact to light touch     H/H:   Recent Labs      08/26/15   0224   HGB  7.4*   HCT  21.2*        PT/INR: No results for input(s): PROT, INR in the last 72 hours.    A/P:  Stable   Proceed with Physical Therapy              Stable for discharge today Home with Home Health Care if does well with PT.  To rehab if not.              Acute post op anemia on chronic anemia.  Hct 21.2, down again from yesterday.  Will transfuse 1 unit pRBCs, and continue with iron replacement.               Follow-up in office @ 3 weeks post-op

## 2015-08-30 LAB — PREPARE RBC (CROSSMATCH)
Dispense Status Blood Bank: TRANSFUSED
Product Code Blood Bank: 0

## 2019-12-26 ENCOUNTER — Other Ambulatory Visit: Payer: Self-pay

## 2019-12-26 ENCOUNTER — Encounter (HOSPITAL_COMMUNITY): Payer: Self-pay | Admitting: Pediatrics

## 2019-12-26 ENCOUNTER — Observation Stay (HOSPITAL_COMMUNITY)
Admission: EM | Admit: 2019-12-26 | Discharge: 2019-12-27 | Disposition: A | Discharge status: Home / Self Care | Payer: Medicare Other | Attending: Internal Medicine | Admitting: Internal Medicine

## 2019-12-26 ENCOUNTER — Observation Stay (HOSPITAL_COMMUNITY): Payer: Medicare Other

## 2019-12-26 DIAGNOSIS — D539 Nutritional anemia, unspecified: Secondary | ICD-10-CM | POA: Diagnosis not present

## 2019-12-26 DIAGNOSIS — Z79899 Other long term (current) drug therapy: Secondary | ICD-10-CM | POA: Insufficient documentation

## 2019-12-26 DIAGNOSIS — M6281 Muscle weakness (generalized): Secondary | ICD-10-CM | POA: Insufficient documentation

## 2019-12-26 DIAGNOSIS — R2681 Unsteadiness on feet: Secondary | ICD-10-CM | POA: Diagnosis not present

## 2019-12-26 DIAGNOSIS — U071 COVID-19: Principal | ICD-10-CM | POA: Insufficient documentation

## 2019-12-26 DIAGNOSIS — Z950 Presence of cardiac pacemaker: Secondary | ICD-10-CM | POA: Diagnosis not present

## 2019-12-26 DIAGNOSIS — H9190 Unspecified hearing loss, unspecified ear: Secondary | ICD-10-CM | POA: Diagnosis not present

## 2019-12-26 DIAGNOSIS — D649 Anemia, unspecified: Secondary | ICD-10-CM | POA: Diagnosis present

## 2019-12-26 DIAGNOSIS — I4891 Unspecified atrial fibrillation: Secondary | ICD-10-CM | POA: Insufficient documentation

## 2019-12-26 DIAGNOSIS — N400 Enlarged prostate without lower urinary tract symptoms: Secondary | ICD-10-CM | POA: Insufficient documentation

## 2019-12-26 DIAGNOSIS — Z7901 Long term (current) use of anticoagulants: Secondary | ICD-10-CM | POA: Insufficient documentation

## 2019-12-26 HISTORY — DX: Anemia, unspecified: D64.9

## 2019-12-26 HISTORY — DX: Benign prostatic hyperplasia without lower urinary tract symptoms: N40.0

## 2019-12-26 LAB — CBC WITH DIFFERENTIAL/PLATELET
Abs Immature Granulocytes: 0.01 10*3/uL (ref 0.00–0.07)
Basophils Absolute: 0 10*3/uL (ref 0.0–0.1)
Basophils Relative: 0 %
Eosinophils Absolute: 0.1 10*3/uL (ref 0.0–0.5)
Eosinophils Relative: 3 %
HCT: 20.9 % — ABNORMAL LOW (ref 39.0–52.0)
Hemoglobin: 6.6 g/dL — CL (ref 13.0–17.0)
Immature Granulocytes: 0 %
Lymphocytes Relative: 30 %
Lymphs Abs: 1 10*3/uL (ref 0.7–4.0)
MCH: 34 pg (ref 26.0–34.0)
MCHC: 31.6 g/dL (ref 30.0–36.0)
MCV: 107.7 fL — ABNORMAL HIGH (ref 80.0–100.0)
Monocytes Absolute: 0.8 10*3/uL (ref 0.1–1.0)
Monocytes Relative: 23 %
Neutro Abs: 1.4 10*3/uL — ABNORMAL LOW (ref 1.7–7.7)
Neutrophils Relative %: 44 %
Platelets: 169 10*3/uL (ref 150–400)
RBC: 1.94 MIL/uL — ABNORMAL LOW (ref 4.22–5.81)
RDW: 27.6 % — ABNORMAL HIGH (ref 11.5–15.5)
WBC: 3.3 10*3/uL — ABNORMAL LOW (ref 4.0–10.5)
nRBC: 0.6 % — ABNORMAL HIGH (ref 0.0–0.2)

## 2019-12-26 LAB — COMPREHENSIVE METABOLIC PANEL
ALT: 16 U/L (ref 0–44)
AST: 15 U/L (ref 15–41)
Albumin: 3.8 g/dL (ref 3.5–5.0)
Alkaline Phosphatase: 47 U/L (ref 38–126)
Anion gap: 4 — ABNORMAL LOW (ref 5–15)
BUN: 21 mg/dL (ref 8–23)
CO2: 25 mmol/L (ref 22–32)
Calcium: 8.5 mg/dL — ABNORMAL LOW (ref 8.9–10.3)
Chloride: 106 mmol/L (ref 98–111)
Creatinine, Ser: 1.01 mg/dL (ref 0.61–1.24)
GFR calc Af Amer: >60 mL/min (ref >60)
GFR calc non Af Amer: >60 mL/min (ref >60)
Glucose, Bld: 106 mg/dL — ABNORMAL HIGH (ref 70–99)
Potassium: 4.3 mmol/L (ref 3.5–5.1)
Sodium: 135 mmol/L (ref 135–145)
Total Bilirubin: 1.1 mg/dL (ref 0.3–1.2)
Total Protein: 7.3 g/dL (ref 6.5–8.1)

## 2019-12-26 LAB — POC OCCULT BLOOD, ED: Fecal Occult Bld: NEGATIVE

## 2019-12-26 LAB — PROTIME-INR
INR: 1.9 — ABNORMAL HIGH (ref 0.8–1.2)
Prothrombin Time: 21.5 seconds — ABNORMAL HIGH (ref 11.4–15.2)

## 2019-12-26 LAB — ABO/RH: ABO/RH(D): B POS

## 2019-12-26 LAB — PREPARE RBC (CROSSMATCH)

## 2019-12-26 LAB — TSH: TSH: 4.039 u[IU]/mL (ref 0.350–4.500)

## 2019-12-26 MED ORDER — PANTOPRAZOLE SODIUM 40 MG IV SOLR
40.0000 mg | Freq: Two times a day (BID) | INTRAVENOUS | Status: DC
  Administered 2019-12-26 – 2019-12-27 (×2): 40 mg via INTRAVENOUS
  Filled 2019-12-26 (×2): qty 40

## 2019-12-26 MED ORDER — VITAMIN D 25 MCG (1000 UNIT) PO TABS: 1000.0000 [IU] | ORAL_TABLET | Freq: Every day | ORAL | Status: DC

## 2019-12-26 MED ORDER — ACETAMINOPHEN 325 MG PO TABS: 650.0000 mg | ORAL_TABLET | Freq: Four times a day (QID) | ORAL | Status: DC | PRN

## 2019-12-26 MED ORDER — SODIUM CHLORIDE 0.9% IV SOLUTION: Freq: Once | INTRAVENOUS | Status: DC

## 2019-12-26 MED ORDER — ALBUTEROL SULFATE (2.5 MG/3ML) 0.083% IN NEBU: 2.5000 mg | INHALATION_SOLUTION | RESPIRATORY_TRACT | Status: DC | PRN

## 2019-12-26 MED ORDER — ONDANSETRON HCL 4 MG PO TABS: 4.0000 mg | ORAL_TABLET | Freq: Four times a day (QID) | ORAL | Status: DC | PRN

## 2019-12-26 MED ORDER — ACETAMINOPHEN 650 MG RE SUPP: 650.0000 mg | Freq: Four times a day (QID) | RECTAL | Status: DC | PRN

## 2019-12-26 MED ORDER — ONDANSETRON HCL 4 MG/2ML IJ SOLN: 4.0000 mg | Freq: Four times a day (QID) | INTRAMUSCULAR | Status: DC | PRN

## 2019-12-26 MED ORDER — VITAMIN D 25 MCG (1000 UNIT) PO TABS
1000.0000 [IU] | ORAL_TABLET | Freq: Every day | ORAL | Status: DC
  Administered 2019-12-27: 1000 [IU] via ORAL
  Filled 2019-12-26: qty 1

## 2019-12-26 MED ORDER — VITAMIN B-12 1000 MCG PO TABS: 1000.0000 ug | ORAL_TABLET | Freq: Every day | ORAL | Status: DC

## 2019-12-26 MED ORDER — POLYETHYLENE GLYCOL 3350 17 G PO PACK: 17.0000 g | PACK | Freq: Every day | ORAL | Status: DC | PRN

## 2019-12-26 MED ORDER — VITAMIN B-12 1000 MCG PO TABS
1000.0000 ug | ORAL_TABLET | Freq: Every day | ORAL | Status: DC
  Administered 2019-12-27: 1000 ug via ORAL
  Filled 2019-12-26: qty 1

## 2019-12-26 MED ORDER — SIMVASTATIN 20 MG PO TABS
20.0000 mg | ORAL_TABLET | Freq: Every day | ORAL | Status: DC
  Administered 2019-12-27: 20 mg via ORAL
  Filled 2019-12-26: qty 1

## 2019-12-26 NOTE — H&P (Signed)
History and Physical  Ricky [name] [id number] DOB: Jul 14, 1931 DOA: 12/26/2019   Patient coming from: Home & is able to ambulate  Chief Complaint: Symptomatic anemia  HPI: Ricky [name] is a 86 y.o. male with medical history significant for A. fib, pacemaker, BPH, Covid pneumonia in 08/2019 presents to the ED after routine blood work showed hemoglobin of 6.1.  Of note, patient is very hard of hearing and history gotten from both patient and the son who is at bedside.  As per son, this is not a new problem, patient has been having symptomatic anemia for the past couple of years and has received blood transfusion, last in January of this year.  Both son and patient and not unaware of any diagnosis.  Patient did have a colonoscopy done in 2017 and was told to return in 5 years for repeat.  Patient just recently moved from [place] to live with son.  Patient denies any dark stools, obvious signs of bleeding, abdominal pain, chest pain, only reports significant fatigue and some shortness of breath.  Every few weeks and nurse comes to the house to check routine labs, patient was found to have severe anemia.  Son brought patient to the ED.   ED Course: Vital signs stable except for some mild tachypnea, labs showed hemoglobin of 6.6, WBC 3.3, INR 1.9, TSH 4.039, asked for an anemia panel prior to transfusion.  Patient was typed and screened by EDP and ordered for 2 units of PRBC.  Due to symptomatic anemia, [facility] hospitalist asked to admit.  Review of Systems: Review of systems are otherwise negative   Social History:  has no history on file for tobacco, alcohol, and drug.   No Known Allergies  No family history on file.    Prior to Admission medications   Medication Sig Start Date End Date Taking? Authorizing Provider  cholecalciferol (VITAMIN D3) 25 MCG (1000 UNIT) tablet Take 1,000 Units by mouth daily.   Yes [provider]  cyanocobalamin 1000 MCG tablet Take 1,000 mcg by  mouth daily.   Yes [provider]  Magnesium 250 MG TABS Take 250 tablets by mouth daily.    Yes [provider]  simvastatin (ZOCOR) 20 MG tablet Take 20 mg by mouth daily.   Yes [provider]  warfarin (COUMADIN) 5 MG tablet Take 5 mg by mouth daily.   Yes [provider]    Physical Exam: BP 136/70   Pulse 78   Temp 97.9 F (36.6 C) (Oral)   Resp (!) 24   Ht 6\' 4"  (1.93 m)   Wt 89.8 kg   SpO2 99%   BMI 24.10 kg/m   General: NAD, very hard of hearing Eyes: Normal ENT: Normal Neck: Supple Cardiovascular: S1, S2 present Respiratory: CTA B Abdomen: Soft, nontender, nondistended, bowel sounds present Skin: Normal Musculoskeletal: 1+ bilateral pedal edema noted Psychiatric: Normal mood Neurologic: No obvious focal neurologic deficits noted          Labs on Admission:  Basic Metabolic Panel: Recent Labs  Lab 12/26/19 1322  NA 135  K 4.3  CL 106  CO2 25  GLUCOSE 106*  BUN 21  CREATININE 1.01  CALCIUM 8.5*   Liver Function Tests: Recent Labs  Lab 12/26/19 1322  AST 15  ALT 16  ALKPHOS 47  BILITOT 1.1  PROT 7.3  ALBUMIN 3.8   No results for input(s): LIPASE, AMYLASE in the last 168 hours. No results for input(s): AMMONIA in the last  168 hours. CBC: Recent Labs  Lab 12/26/19 1322  WBC 3.3*  NEUTROABS 1.4*  HGB 6.6*  HCT 20.9*  MCV 107.7*  PLT 169   Cardiac Enzymes: No results for input(s): CKTOTAL, CKMB, CKMBINDEX, TROPONINI in the last 168 hours.  BNP (last 3 results) No results for input(s): BNP in the last 8760 hours.  ProBNP (last 3 results) No results for input(s): PROBNP in the last 8760 hours.  CBG: No results for input(s): GLUCAP in the last 168 hours.  Radiological Exams on Admission: No results found.  EKG: EKG pending  Assessment/Plan Present on Admission: . Symptomatic anemia  Principal Problem:   Symptomatic anemia   Acute on chronic symptomatic macrocytic anemia Hemoglobin 6.6  on presentation, no baseline to compare FOBT negative, pt on Coumadin, INR 1.9 on presentation Chronic issue, has required transfusions in the past Son stated no definitive diagnosis was ever made Order to request medical records from [facility] placed Asked EDP to order anemia panel prior to transfusion EDP ordered 2 units of PRBC, H&H posttransfusion [facility] consulted  May need heme-onc consultation if GI work-up is negative Monitor H&H closely  Chronic A. fib on Coumadin Rate controlled INR 1.9, hold Coumadin for now due to low hemoglobin  History of Covid pneumonia in 08/2019 Stayed in the hospital for 1 week, required no intubation      DVT prophylaxis: SCD  Code Status: Full code, discussed with son at bedside  Family Communication: Discussed with son at bedside  Disposition Plan: Likely back home after work-up, pending PT/OT, consultant signing off  Consults called: GI  Admission status: Observation    Alma Friendly MD [facility]   If 7PM-7AM, please contact night-coverage [email or web address]  12/26/2019, 6:46 PM

## 2019-12-26 NOTE — ED Notes (Signed)
Blood bank called and spoke to this RN. Blood bank stated they originally called in a positive antibody result and wanted to clarify that the pt is actually antibody screen negative.

## 2019-12-26 NOTE — ED Triage Notes (Signed)
Son at bedside stated findings of low hemoglobin (6.1) per PCP. On coumadin;and has pacemaker

## 2019-12-26 NOTE — ED Provider Notes (Signed)
[place] EMERGENCY DEPARTMENT Provider Note   CSN: [id number] Arrival date & time: 12/26/19  1250     History Chief Complaint  Patient presents with  . LOW HEMOGLOBIN    Ricky [name] is a 86 y.o. male.  Pt presents to the ED today with weakness and low hemoglobin.  Pt has recently moved here from [place], [place] and has not been to [facility] in several years.  He has a hx of afib and a pacemaker.  He is on coumadin.  Every few weeks, a nurse comes to the house and draws blood to check his INR and CBC.  He was told his hgb was low and to come to the ED.  Pt did have hgb of 8.6 in October and it was low again in December when he was admitted for [facility] in [place].  He received a transfusion in January of this year.  Pt's son said they thought his hemoglobin was low due to Covid.  Pt said he feels tired, but that is not unusual.  He has not seen any black stools.  Pt did have a colonoscopy last in 2017 in [place].  Pt is very HOH and son in the room supplements hx.  Pt's PCP is in [place].  He is just getting set up with a doctor with doctors making housecalls Clide Deutscher, AGNP-BC)        No past medical history on file.  Patient Active Problem List   Diagnosis Date Noted  . Symptomatic anemia 12/26/2019   High cholesterol HOH     No family history on file.  Social History   Tobacco Use  . Smoking status: Not on file  Substance Use Topics  . Alcohol use: Not on file  . Drug use: Not on file    Home Medications Prior to Admission medications   Medication Sig Start Date End Date Taking? Authorizing Provider  cholecalciferol (VITAMIN D3) 25 MCG (1000 UNIT) tablet Take 1,000 Units by mouth daily.   Yes [provider]  cyanocobalamin 1000 MCG tablet Take 1,000 mcg by mouth daily.   Yes [provider]  Magnesium 250 MG TABS Take 250 tablets by mouth daily.    Yes [provider]  simvastatin (ZOCOR) 20 MG tablet Take 20 mg by mouth  daily.   Yes [provider]  warfarin (COUMADIN) 5 MG tablet Take 5 mg by mouth daily.   Yes [provider]   Coumadin Simvastatin Vit B12 Vit D  Allergies    Patient has no known allergies.  Review of Systems   Review of Systems  Constitutional: Positive for fatigue.  All other systems reviewed and are negative.   Physical Exam Updated Vital Signs BP 136/70   Pulse 78   Temp 97.9 F (36.6 C) (Oral)   Resp (!) 24   Ht 6\' 4"  (1.93 m)   Wt 89.8 kg   SpO2 99%   BMI 24.10 kg/m   Physical Exam Vitals and nursing note reviewed.  Constitutional:      Appearance: Normal appearance.  HENT:     Head: Normocephalic and atraumatic.     Right Ear: External ear normal.     Left Ear: External ear normal.     Nose: Nose normal.     Mouth/Throat:     Mouth: Mucous membranes are moist.     Pharynx: Oropharynx is clear.  Eyes:     Extraocular Movements: Extraocular movements intact.  Conjunctiva/sclera: Conjunctivae normal.     Pupils: Pupils are equal, round, and reactive to light.  Cardiovascular:     Rate and Rhythm: Normal rate and regular rhythm.     Pulses: Normal pulses.     Heart sounds: Normal heart sounds.  Pulmonary:     Effort: Pulmonary effort is normal.     Breath sounds: Normal breath sounds.  Abdominal:     General: Abdomen is flat. Bowel sounds are normal.     Palpations: Abdomen is soft.  Genitourinary:    Rectum: Guaiac result negative.  Musculoskeletal:        General: Normal range of motion.     Cervical back: Normal range of motion and neck supple.  Skin:    Capillary Refill: Capillary refill takes less than 2 seconds.     Coloration: Skin is pale.  Neurological:     General: No focal deficit present.     Mental Status: He is alert and oriented to person, place, and time.  Psychiatric:        Mood and Affect: Mood normal.        Behavior: Behavior normal.     ED Results / Procedures / Treatments   Labs (all labs  ordered are listed, but only abnormal results are displayed) Labs Reviewed  CBC WITH DIFFERENTIAL/PLATELET - Abnormal; Notable for the following components:      Result Value   WBC 3.3 (*)    RBC 1.94 (*)    Hemoglobin 6.6 (*)    HCT 20.9 (*)    MCV 107.7 (*)    RDW 27.6 (*)    nRBC 0.6 (*)    Neutro Abs 1.4 (*)    All other components within normal limits  COMPREHENSIVE METABOLIC PANEL - Abnormal; Notable for the following components:   Glucose, Bld 106 (*)    Calcium 8.5 (*)    Anion gap 4 (*)    All other components within normal limits  PROTIME-INR - Abnormal; Notable for the following components:   Prothrombin Time 21.5 (*)    INR 1.9 (*)    All other components within normal limits  SARS CORONAVIRUS 2 (TAT 6-24 HRS)  TSH  VITAMIN B12  FOLATE  IRON AND TIBC  FERRITIN  SAVE SMEAR (SSMR)  URINALYSIS, ROUTINE W REFLEX MICROSCOPIC  POC OCCULT BLOOD, ED  TYPE AND SCREEN  ABO/RH  PREPARE RBC (CROSSMATCH)    EKG None  Radiology No results found.  Procedures Procedures (including critical care time)  Medications Ordered in ED Medications  0.9 %  sodium chloride infusion (Manually program via Guardrails IV Fluids) (has no administration in time range)    ED Course  I have reviewed the triage vital signs and the nursing notes.  Pertinent labs & imaging results that were available during my care of the patient were reviewed by me and considered in my medical decision making (see chart for details).    MDM Rules/Calculators/A&P                      2 units of blood ordered for transfusion.  Pt d/w Dr. Horris Latino ([facility]) for admission.   CRITICAL CARE Performed by: Isla Pence   Total critical care time: 30 minutes  Critical care time was exclusive of separately billable procedures and treating other patients.  Critical care was necessary to treat or prevent imminent or life-threatening deterioration.  Critical care was time spent personally by me on  the following activities:  development of treatment plan with patient and/or surrogate as well as nursing, discussions with consultants, evaluation of patient's response to treatment, examination of patient, obtaining history from patient or surrogate, ordering and performing treatments and interventions, ordering and review of laboratory studies, ordering and review of radiographic studies, pulse oximetry and re-evaluation of patient's condition.  Final Clinical Impression(s) / ED Diagnoses Final diagnoses:  Symptomatic anemia    Rx / DC Orders ED Discharge Orders    None       Isla Pence, MD 12/26/19 1844

## 2019-12-27 ENCOUNTER — Encounter (HOSPITAL_COMMUNITY): Payer: Self-pay | Admitting: Internal Medicine

## 2019-12-27 ENCOUNTER — Encounter: Payer: Self-pay | Admitting: Physician Assistant

## 2019-12-27 DIAGNOSIS — D649 Anemia, unspecified: Secondary | ICD-10-CM | POA: Diagnosis not present

## 2019-12-27 DIAGNOSIS — U071 COVID-19: Secondary | ICD-10-CM

## 2019-12-27 LAB — CBC
HCT: 26.2 % — ABNORMAL LOW (ref 39.0–52.0)
Hemoglobin: 8.8 g/dL — ABNORMAL LOW (ref 13.0–17.0)
MCH: 33.6 pg (ref 26.0–34.0)
MCHC: 33.6 g/dL (ref 30.0–36.0)
MCV: 100 fL (ref 80.0–100.0)
Platelets: 164 10*3/uL (ref 150–400)
RBC: 2.62 MIL/uL — ABNORMAL LOW (ref 4.22–5.81)
RDW: 23.9 % — ABNORMAL HIGH (ref 11.5–15.5)
WBC: 3.5 10*3/uL — ABNORMAL LOW (ref 4.0–10.5)
nRBC: 0.6 % — ABNORMAL HIGH (ref 0.0–0.2)

## 2019-12-27 LAB — TYPE AND SCREEN
ABO/RH(D): B POS
Antibody Screen: NEGATIVE
Unit division: 0
Unit division: 0

## 2019-12-27 LAB — FERRITIN: Ferritin: 720 ng/mL — ABNORMAL HIGH (ref 24–336)

## 2019-12-27 LAB — SARS CORONAVIRUS 2 (TAT 6-24 HRS): SARS Coronavirus 2: POSITIVE — AB

## 2019-12-27 LAB — BPAM RBC
Blood Product Expiration Date: 202105082359
Blood Product Expiration Date: 202105082359
ISSUE DATE / TIME: 202104061659
ISSUE DATE / TIME: 202104062149
Unit Type and Rh: 7300
Unit Type and Rh: 7300

## 2019-12-27 LAB — IRON AND TIBC
Iron: 179 ug/dL (ref 45–182)
Saturation Ratios: 84 % — ABNORMAL HIGH (ref 17.9–39.5)
TIBC: 213 ug/dL — ABNORMAL LOW (ref 250–450)
UIBC: 34 ug/dL

## 2019-12-27 LAB — COMPREHENSIVE METABOLIC PANEL
ALT: 14 U/L (ref 0–44)
AST: 15 U/L (ref 15–41)
Albumin: 3.4 g/dL — ABNORMAL LOW (ref 3.5–5.0)
Alkaline Phosphatase: 42 U/L (ref 38–126)
Anion gap: 9 (ref 5–15)
BUN: 23 mg/dL (ref 8–23)
CO2: 25 mmol/L (ref 22–32)
Calcium: 8.7 mg/dL — ABNORMAL LOW (ref 8.9–10.3)
Chloride: 106 mmol/L (ref 98–111)
Creatinine, Ser: 0.91 mg/dL (ref 0.61–1.24)
GFR calc Af Amer: >60 mL/min (ref >60)
GFR calc non Af Amer: >60 mL/min (ref >60)
Glucose, Bld: 95 mg/dL (ref 70–99)
Potassium: 3.9 mmol/L (ref 3.5–5.1)
Sodium: 140 mmol/L (ref 135–145)
Total Bilirubin: 1.4 mg/dL — ABNORMAL HIGH (ref 0.3–1.2)
Total Protein: 6.7 g/dL (ref 6.5–8.1)

## 2019-12-27 LAB — PROTIME-INR
INR: 2.1 — ABNORMAL HIGH (ref 0.8–1.2)
Prothrombin Time: 23.2 seconds — ABNORMAL HIGH (ref 11.4–15.2)

## 2019-12-27 LAB — FOLATE: Folate: 12.3 ng/mL (ref >5.9)

## 2019-12-27 LAB — SAVE SMEAR(SSMR), FOR PROVIDER SLIDE REVIEW

## 2019-12-27 LAB — VITAMIN B12: Vitamin B-12: 375 pg/mL (ref 180–914)

## 2019-12-27 MED ORDER — MAGNESIUM 250 MG PO TABS: 1.0000 | ORAL_TABLET | Freq: Every day | ORAL | 0 refills | Status: AC

## 2019-12-27 NOTE — Discharge Summary (Signed)
Physician Discharge Summary  Ricky [name] [id number] DOB: [birth year] DOA: 12/26/2019  PCP: Ricky [name], No Pcp Per  Admit date: 12/26/2019 Discharge date: 12/27/2019  Admitted From: Home. Disposition: Home  Recommendations for Outpatient Follow-up:  1. Follow up with PCP in 1-2 weeks 2. Will refer to hematology oncology for follow-up.   Discharge Condition: Stable CODE STATUS: Full code Diet recommendation: Regular diet  Discharge summary: 86 year old gentleman with history of chronic A. fib on Coumadin, pacemaker in situ, chronic anemia who recently moved from [place] to [place] to live with his son.  He had COVID-19 pneumonia on 12/31/ 2020 and was treated in the hospital for 7 days and discharged to a skilled nursing facility. After moving to [facility], he was established with home visit, a routine lab test showed hemoglobin of 6.6 and was sent to ER.  Ricky [name] denied any symptoms other than feeling weak, he is hard of hearing.  Acute on chronic anemia, suspect anemia of chronic disease: Hemoglobin 6.6-2 units PRBC transfusion-8.8 appropriate response.  No evidence of active bleeding.  MCV [id number], 123456 folic acid, iron levels, ferritin normal.  INR 2.1.  FOBT negative. History of chronic anemia, previous available records with hemoglobin 8-9. Colonoscopy 2017, results not available, was told next colonoscopy not needed. Plan: Seen by gastroenterology.  With no evidence of endoluminal bleeding, currently no indication for EGD colonoscopy.  Ricky [name] has appropriately responded.  Probably has chronic blood loss or bone marrow suppression.  Will refer to hematology for follow-up.  Positive COVID-19 test: Documented positive COVID-19 pneumonia on 09/21/2019 Ricky [name] with no respiratory symptoms. He is COVID-19 positive is probably persistent positivity, less likely reinfection. All education given to the Ricky [name] and family to take all precautions, watch out for symptoms and seek help for any  worsening symptoms.  Even though this is likely persistent positivity, advised to quarantine  for 10 days.  Since he does not have evidence of active bleeding, he will continue to take Coumadin.  Discharge Diagnoses:  Principal Problem:   Symptomatic anemia    Discharge Instructions  Discharge Instructions    Ambulatory referral to Hematology   Complete by: As directed    Chronic anemia   Call MD for:  difficulty breathing, headache or visual disturbances   Complete by: As directed    Call MD for:  temperature >100.4   Complete by: As directed    Diet general   Complete by: As directed    Increase activity slowly   Complete by: As directed      Allergies as of 12/27/2019   No Known Allergies     Medication List    TAKE these medications   cholecalciferol 25 MCG (1000 UNIT) tablet Commonly known as: VITAMIN D3 Take 1,000 Units by mouth daily.   cyanocobalamin 1000 MCG tablet Take 1,000 mcg by mouth daily.   Magnesium 250 MG Tabs Take 1 tablet (250 mg total) by mouth daily. What changed: how much to take   simvastatin 20 MG tablet Commonly known as: ZOCOR Take 20 mg by mouth daily.   warfarin 5 MG tablet Commonly known as: COUMADIN Take 5 mg by mouth daily.      Follow-up Information    Ricky Erp, PA Follow up on 01/18/2020.   Specialty: Gastroenterology Why: 10 AM for follow up of anemia.   Contact information: [place] [address] [phone]          No Known Allergies  Consultations:  Gastroenterology  Procedures/Studies: DG Chest 2 View  Result Date: 12/26/2019 CLINICAL DATA:  Anemia. EXAM: CHEST - 2 VIEW COMPARISON:  None recent FINDINGS: There is a dual chamber left-sided pacemaker in place. The heart size is borderline enlarged. The lungs appear somewhat hyperexpanded. There is some atelectasis versus scarring at the lung bases. There are trace to small bilateral pleural effusions, left greater than  right. There is some vascular congestion without overt pulmonary edema. Aortic calcifications are noted. There are advanced degenerative changes of the right glenohumeral joint. IMPRESSION: Borderline cardiomegaly with pulmonary vascular congestion. Trace to small bilateral pleural effusions, left greater than right. Bibasilar atelectasis versus scarring. Electronically Signed   By: Constance Holster M.D.   On: 12/26/2019 20:54      Subjective: Ricky [name] seen and examined.  No overnight events.  Received blood.  Ready to go home.  Son at bedside.   Discharge Exam: Vitals:   12/27/19 1000 12/27/19 1301  BP: 123/63 (!) 113/51  Pulse: 67 64  Resp: 16 18  Temp: 98.3 F (36.8 C) (!) 97.4 F (36.3 C)  SpO2: 97% 98%   Vitals:   12/27/19 0050 12/27/19 0443 12/27/19 1000 12/27/19 1301  BP: (!) 109/53 (!) 114/58 123/63 (!) 113/51  Pulse: 70 72 67 64  Resp: 19 18 16 18   Temp: 97.8 F (36.6 C) 98 F (36.7 C) 98.3 F (36.8 C) (!) 97.4 F (36.3 C)  TempSrc: Oral Oral Oral Oral  SpO2: 98% 98% 97% 98%  Weight:      Height:        General: Pt is alert, awake, not in acute distress, hard of hearing. Cardiovascular: RRR, S1/S2 +, no rubs, no gallops, pacemaker present. Respiratory: CTA bilaterally, no wheezing, no rhonchi Abdominal: Soft, NT, ND, bowel sounds + Extremities: no edema, no cyanosis    The results of significant diagnostics from this hospitalization (including imaging, microbiology, ancillary and laboratory) are listed below for reference.     Microbiology: Recent Results (from the past 240 hour(s))  SARS CORONAVIRUS 2 (TAT 6-24 HRS) Nasopharyngeal Nasopharyngeal Swab     Status: Abnormal   Collection Time: 12/26/19  4:30 PM   Specimen: Nasopharyngeal Swab  Result Value Ref Range Status   SARS Coronavirus 2 POSITIVE (A) NEGATIVE Final    Comment: RESULT CALLED TO, READ BACK BY AND VERIFIED WITH: RN E OFORI @0619  12/27/19 BY S GEZAHEGN (NOTE) SARS-CoV-2 target nucleic  acids are DETECTED. The SARS-CoV-2 RNA is generally detectable in upper and lower respiratory specimens during the acute phase of infection. Positive results are indicative of the presence of SARS-CoV-2 RNA. Clinical correlation with Ricky [name] history and other diagnostic information is  necessary to determine Ricky [name] infection status. Positive results do not rule out bacterial infection or co-infection with other viruses.  The expected result is Negative. Fact Sheet for Patients: SugarRoll.be Fact Sheet for Healthcare Providers: https://www.woods-mathews.com/ This test is not yet approved or cleared by the [place] FDA and  has been authorized for detection and/or diagnosis of SARS-CoV-2 by FDA under an Emergency Use Authorization (EUA). This EUA will remain  in effect (meaning this test can be used) for t he duration of the COVID-19 declaration under Section 564(b)(1) of the Act, 21 U.S.C. section 360bbb-3(b)(1), unless the authorization is terminated or revoked sooner. Performed at [facility], [place] [address], [place], [place] [address]      Labs: BNP (last 3 results) No results for input(s): BNP in the last 8760 hours. Basic Metabolic Panel: Recent  Labs  Lab 12/26/19 1322 12/27/19 0318  NA 135 140  K 4.3 3.9  CL 106 106  CO2 25 25  GLUCOSE 106* 95  BUN 21 23  CREATININE 1.01 0.91  CALCIUM 8.5* 8.7*   Liver Function Tests: Recent Labs  Lab 12/26/19 1322 12/27/19 0318  AST 15 15  ALT 16 14  ALKPHOS 47 42  BILITOT 1.1 1.4*  PROT 7.3 6.7  ALBUMIN 3.8 3.4*   No results for input(s): LIPASE, AMYLASE in the last 168 hours. No results for input(s): AMMONIA in the last 168 hours. CBC: Recent Labs  Lab 12/26/19 1322 12/27/19 0318  WBC 3.3* 3.5*  NEUTROABS 1.4*  --   HGB 6.6* 8.8*  HCT 20.9* 26.2*  MCV 107.7* 100.0  PLT 169 164   Cardiac Enzymes: No results for input(s): CKTOTAL, CKMB, CKMBINDEX, TROPONINI  in the last 168 hours. BNP: Invalid input(s): POCBNP CBG: No results for input(s): GLUCAP in the last 168 hours. D-Dimer No results for input(s): DDIMER in the last 72 hours. Hgb A1c No results for input(s): HGBA1C in the last 72 hours. Lipid Profile No results for input(s): CHOL, HDL, LDLCALC, TRIG, CHOLHDL, LDLDIRECT in the last 72 hours. Thyroid function studies Recent Labs    12/26/19 1622  TSH 4.039   Anemia work up Recent Labs    12/27/19 0318  VITAMINB12 375  FOLATE 12.3  FERRITIN 720*  TIBC 213*  IRON 179   Urinalysis No results found for: COLORURINE, APPEARANCEUR, LABSPEC, [place], GLUCOSEU, HGBUR, BILIRUBINUR, KETONESUR, PROTEINUR, UROBILINOGEN, NITRITE, LEUKOCYTESUR Sepsis Labs Invalid input(s): PROCALCITONIN,  WBC,  LACTICIDVEN Microbiology Recent Results (from the past 240 hour(s))  SARS CORONAVIRUS 2 (TAT 6-24 HRS) Nasopharyngeal Nasopharyngeal Swab     Status: Abnormal   Collection Time: 12/26/19  4:30 PM   Specimen: Nasopharyngeal Swab  Result Value Ref Range Status   SARS Coronavirus 2 POSITIVE (A) NEGATIVE Final    Comment: RESULT CALLED TO, READ BACK BY AND VERIFIED WITH: RN E OFORI @0619  12/27/19 BY S GEZAHEGN (NOTE) SARS-CoV-2 target nucleic acids are DETECTED. The SARS-CoV-2 RNA is generally detectable in upper and lower respiratory specimens during the acute phase of infection. Positive results are indicative of the presence of SARS-CoV-2 RNA. Clinical correlation with Ricky [name] history and other diagnostic information is  necessary to determine Ricky [name] infection status. Positive results do not rule out bacterial infection or co-infection with other viruses.  The expected result is Negative. Fact Sheet for Patients: SugarRoll.be Fact Sheet for Healthcare Providers: https://www.woods-mathews.com/ This test is not yet approved or cleared by the [place] FDA and  has been authorized for detection and/or  diagnosis of SARS-CoV-2 by FDA under an Emergency Use Authorization (EUA). This EUA will remain  in effect (meaning this test can be used) for t he duration of the COVID-19 declaration under Section 564(b)(1) of the Act, 21 U.S.C. section 360bbb-3(b)(1), unless the authorization is terminated or revoked sooner. Performed at [facility], [place] [address] [place], [place] [address]      Time coordinating discharge: 32 minutes  SIGNED:   Barb Merino, MD  [facility] 12/27/2019, 1:54 PM

## 2019-12-27 NOTE — Plan of Care (Signed)
  Problem: Education: Goal: Knowledge of General Education information will improve Description: Including pain rating scale, medication(s)/side effects and non-pharmacologic comfort measures Outcome: Progressing  Patient and son able to provide extensive data and records regarding patient's health history, verbalize plan for quarantining at home.  Problem: Clinical Measurements: Goal: Will remain free from infection Outcome: Progressing  Patient afebrile at this time, denies feeling cold or clammy.  Problem: Clinical Measurements: Goal: Respiratory complications will improve Outcome: Progressing Patient denies SOB at this time.  Problem: Activity: Goal: Risk for activity intolerance will decrease Outcome: Progressing  Patient able to transfer from bed to chair with minimal assistance, denies SOB.

## 2019-12-27 NOTE — TOC Initial Note (Addendum)
Transition of Care [facility]) - Initial/Assessment Note    Patient Details  Name: Ricky [name] MRN: [id number] Date of Birth: Jul 16, 1931  [facility] of [facility]) CM/SW Contact:    Marilu Favre, RN Phone Number: 12/27/2019, 1:45 PM  Clinical Narrative:                 Spoke to patient and son Jakar at bedside. Patient has moved from [place] to live with son Ronel. Address [place], [place], [place] [address].   Patient's PCP is with [employer], [place] who was sending a nurse from same company to draw labs in the home.   Patient has [employer] and [place] for [employer]. Son Eddieberto provided a copy when patient was admitted to admissions, will call ( not entered in system). Son states it is BCBS, not medicare.   1630 Orders for HHPT/OT , son would like HH to start at his home prior to patient going to [facility] [place] [facility]. Son would like [place] 's preferred provider. Called was told [facility], referral given to [place] [facility] and accepted   Expected Discharge Plan: Home/Self Care Barriers to Discharge: Continued Medical Work up   Patient Goals and CMS Choice Patient states their goals for this hospitalization and ongoing recovery are:: to return to sons home CMS Medicare.gov Compare Post Acute Care list provided to:: Patient Choice offered to / list presented to : NA  Expected Discharge Plan and Services Expected Discharge Plan: Home/Self Care   Discharge Planning Services: CM Consult   Living arrangements for the past 2 months: Single Family Home                 DME Arranged: N/A DME Agency: NA       HH Arranged: NA          Prior Living Arrangements/Services Living arrangements for the past 2 months: Single Family Home Lives with:: Adult Children Patient language and need for interpreter reviewed:: Yes Do you feel safe going back to the place where you live?: Yes      Need for Family  Participation in Patient Care: Yes (Comment) Care giver support system in place?: Yes (comment) Current home services: DME Criminal Activity/Legal Involvement Pertinent to Current Situation/Hospitalization: No - Comment as needed  Activities of Daily Living      Permission Sought/Granted   Permission granted to share information with : Yes, Verbal Permission Granted  Share Information with NAME: son Jaymari Mancera           Emotional Assessment Appearance:: Appears stated age Attitude/Demeanor/Rapport: Engaged Affect (typically observed): Accepting Orientation: : Oriented to Self, Oriented to Place, Oriented to  Time, Oriented to Situation Alcohol / Substance Use: Not Applicable Psych Involvement: No (comment)  Admission diagnosis:  Symptomatic anemia [D64.9] Patient Active Problem List   Diagnosis Date Noted  . Symptomatic anemia 12/26/2019   PCP:  Patient, No Pcp Per Pharmacy:   [id number] - [place], [place] - [address] [address] Phone: [phone] Fax: [phone]     Social Determinants of Health (SDOH) Interventions    Readmission Risk Interventions No flowsheet data found.

## 2019-12-27 NOTE — Progress Notes (Signed)
CRITICAL VALUE ALERT  Critical Value:  Positive covid19  Date & Time Notied:  12/27/19 [id number]  Provider Notified: Samuella Bruin  Orders Received/Actions taken: none.

## 2019-12-27 NOTE — Discharge Instructions (Signed)
Anemia  Anemia is a condition in which you do not have enough red blood cells or hemoglobin. Hemoglobin is a substance in red blood cells that carries oxygen. When you do not have enough red blood cells or hemoglobin (are anemic), your body cannot get enough oxygen and your organs may not work properly. As a result, you may feel very tired or have other problems. What are the causes? Common causes of anemia include:  Excessive bleeding. Anemia can be caused by excessive bleeding inside or outside the body, including bleeding from the intestine or from periods in women.  Poor nutrition.  Long-lasting (chronic) kidney, thyroid, and liver disease.  Bone marrow disorders.  Cancer and treatments for cancer.  HIV (human immunodeficiency virus) and AIDS (acquired immunodeficiency syndrome).  Treatments for HIV and AIDS.  Spleen problems.  Blood disorders.  Infections, medicines, and autoimmune disorders that destroy red blood cells. What are the signs or symptoms? Symptoms of this condition include:  Minor weakness.  Dizziness.  Headache.  Feeling heartbeats that are irregular or faster than normal (palpitations).  Shortness of breath, especially with exercise.  Paleness.  Cold sensitivity.  Indigestion.  Nausea.  Difficulty sleeping.  Difficulty concentrating. Symptoms may occur suddenly or develop slowly. If your anemia is mild, you may not have symptoms. How is this diagnosed? This condition is diagnosed based on:  Blood tests.  Your medical history.  A physical exam.  Bone marrow biopsy. Your health care provider may also check your stool (feces) for blood and may do additional testing to look for the cause of your bleeding. You may also have other tests, including:  Imaging tests, such as a CT scan or MRI.  Endoscopy.  Colonoscopy. How is this treated? Treatment for this condition depends on the cause. If you continue to lose a lot of blood, you may  need to be treated at a hospital. Treatment may include:  Taking supplements of iron, vitamin S31, or folic acid.  Taking a hormone medicine (erythropoietin) that can help to stimulate red blood cell growth.  Having a blood transfusion. This may be needed if you lose a lot of blood.  Making changes to your diet.  Having surgery to remove your spleen. Follow these instructions at home:  Take over-the-counter and prescription medicines only as told by your health care provider.  Take supplements only as told by your health care provider.  Follow any diet instructions that you were given.  Keep all follow-up visits as told by your health care provider. This is important. Contact a health care provider if:  You develop new bleeding anywhere in the body. Get help right away if:  You are very weak.  You are short of breath.  You have pain in your abdomen or chest.  You are dizzy or feel faint.  You have trouble concentrating.  You have bloody or black, tarry stools.  You vomit repeatedly or you vomit up blood. Summary  Anemia is a condition in which you do not have enough red blood cells or enough of a substance in your red blood cells that carries oxygen (hemoglobin).  Symptoms may occur suddenly or develop slowly.  If your anemia is mild, you may not have symptoms.  This condition is diagnosed with blood tests as well as a medical history and physical exam. Other tests may be needed.  Treatment for this condition depends on the cause of the anemia. This information is not intended to replace advice given to you by  your health care provider. Make sure you discuss any questions you have with your health care provider. Document Revised: 08/20/2017 Document Reviewed: 10/09/2016 Elsevier Patient Education  2020 Elsevier Inc.  

## 2019-12-27 NOTE — Plan of Care (Signed)
Nsg Discharge Note  Admit Date:  12/26/2019 Discharge date: 12/27/2019   [name] to be D/C'd Home per MD order.  AVS completed.  Copy for chart, and copy for patient signed, and dated. Patient/caregiver able to verbalize understanding.  Discharge Medication: Allergies as of 12/27/2019   No Known Allergies      Medication List     TAKE these medications    cholecalciferol 25 MCG (1000 UNIT) tablet Commonly known as: VITAMIN D3 Take 1,000 Units by mouth daily. Notes to patient: Tomorrow, 12/28/19.   cyanocobalamin 1000 MCG tablet Take 1,000 mcg by mouth daily. Notes to patient: Tomorrow, 12/28/19.   Magnesium 250 MG Tabs Take 1 tablet (250 mg total) by mouth daily. What changed: how much to take Notes to patient: Tomorrow, 12/28/19.   simvastatin 20 MG tablet Commonly known as: ZOCOR Take 20 mg by mouth daily. Notes to patient: Tomorrow, 12/28/19.   warfarin 5 MG tablet Commonly known as: COUMADIN Take 5 mg by mouth daily. Notes to patient: Today, 12/27/19.        Discharge Assessment: Vitals:   12/27/19 1000 12/27/19 1301  BP: 123/63 (!) 113/51  Pulse: 67 64  Resp: 16 18  Temp: 98.3 F (36.8 C) (!) 97.4 F (36.3 C)  SpO2: 97% 98%   Skin clean, dry and intact without evidence of skin break down, no evidence of skin tears noted. IV catheter discontinued intact. Site without signs and symptoms of complications - no redness or edema noted at insertion site, patient denies c/o pain - only slight tenderness at site.  Dressing with slight pressure applied.  D/c Instructions-Education: Discharge instructions given to patient/family with verbalized understanding. D/c education completed with patient/family including follow up instructions, medication list, d/c activities limitations if indicated, with other d/c instructions as indicated by MD - patient able to verbalize understanding, all questions fully answered. Patient instructed to return to ED, call 911, or call  MD for any changes in condition.  Patient escorted via [place], and D/C home via private auto.  Joni Reining, RN 12/27/2019 6:00 PM

## 2019-12-27 NOTE — Consult Note (Addendum)
[facility] Consult: 12:12 PM 12/27/2019  LOS: 0 days    Referring Provider: Dr Raelyn Mora Primary Care Physician:  Arville Go PA-c  Primary Gastroenterologist:  unassigned  [name] for Consultation:  Acute on chronic anemia.  FOBT negative.     HPI: Ricky [name] is a 86 y.o. male.  Recent relocation from [place] to [place].   PMH Afib, chronic coumadin.  Pacemaker in situ.  Hard of hearing.  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  Left esophagopleural fistula in 2006.  09/2005 esophagram w tiny contrast extravasation at distal esophagus, improved from previous.  No associated op notes, H&P etc corresponding with imaging studies but he was attended to by Dr. Lucianne Lei trigt in [place] at the time.  Colonoscopy in 2017 in [place], do not have any details on results but did not sound as if he had polyps.  Previous knee and hip replacements. 8-day Hospital admission in [place] infection starting 09/21/2019.   Hgb in 06/2019 was 8.6, still anemic in 08/2019 and during the Covid admission required transfusion with PRBCs.  Also in care everywhere is documentation of PRBCs x2 given in 2016 when Hb was 7.4.  Has not had any bloody or melenic stool.  No heartburn, indigestion, nausea, anorexia.Marland Kitchen Home health drws labs and noted Hg low so admitted to hospital.  FOBT is negative.    No fatigue/weakness above baseline.    Hgb 6.6 >> 2 PRBC >> 8.8.  MCV 107 b 12, folate, iron, ferritin ok.  Low TIBC.  Ferritin 720 INR 2.1.   Tested positive for Covid, pt's family agreeable to discharge home and home quarentine as pt not symptomatic    Social history Social drinker up until about age 40. Smoked when he was in his teens and 54s. Married, lives with his wife.  They are planning to enter independent living at [facility] soon  but in the meantime are living with their son.    Prior to Admission medications   Medication Sig Start Date End Date Taking? Authorizing Provider  cholecalciferol (VITAMIN D3) 25 MCG (1000 UNIT) tablet Take 1,000 Units by mouth daily.   Yes [provider]  cyanocobalamin 1000 MCG tablet Take 1,000 mcg by mouth daily.   Yes [provider]  Magnesium 250 MG TABS Take 250 tablets by mouth daily.    Yes [provider]  simvastatin (ZOCOR) 20 MG tablet Take 20 mg by mouth daily.   Yes [provider]  warfarin (COUMADIN) 5 MG tablet Take 5 mg by mouth daily.   Yes [provider]    Scheduled Meds: . sodium chloride   Intravenous Once  . cholecalciferol  1,000 Units Oral Daily  . pantoprazole (PROTONIX) IV  40 mg Intravenous Q12H  . simvastatin  20 mg Oral Daily  . cyanocobalamin  1,000 mcg Oral Daily   Infusions:  PRN Meds: acetaminophen **OR** acetaminophen, albuterol, ondansetron **OR** ondansetron (ZOFRAN) IV, polyethylene glycol   Allergies as of 12/26/2019  . (No Known Allergies)    No family history on  file.  Social History   Socioeconomic History  . Marital status: Single    Spouse name: Not on file  . Number of children: Not on file  . Years of education: Not on file  . Highest education level: Not on file  Occupational History  . Not on file  Tobacco Use  . Smoking status: Not on file  Substance and Sexual Activity  . Alcohol use: Not on file  . Drug use: Not on file  . Sexual activity: Not on file  Other Topics Concern  . Not on file  Social History Narrative  . Not on file   Social Determinants of Health   Financial Resource Strain:   . Difficulty of Paying Living Expenses:   Food Insecurity:   . Worried About Charity fundraiser in the Last Year:   . Arboriculturist in the Last Year:   Transportation Needs:   . Film/video editor (Medical):   Marland Kitchen Lack of Transportation (Non-Medical):   Physical  Activity:   . Days of Exercise per Week:   . Minutes of Exercise per Session:   Stress:   . Feeling of Stress :   Social Connections:   . Frequency of Communication with Friends and Family:   . Frequency of Social Gatherings with Friends and Family:   . Attends Religious Services:   . Active Member of Clubs or Organizations:   . Attends Archivist Meetings:   Marland Kitchen Marital Status:   Intimate Partner Violence:   . Fear of Current or Ex-Partner:   . Emotionally Abused:   Marland Kitchen Physically Abused:   . Sexually Abused:     REVIEW OF SYSTEMS: Constitutional: Some weakness, not profound. ENT:  No nose bleeds Pulm: No shortness of breath.  No cough. CV:  No palpitations, no LE edema.  Chest pain GU:  No hematuria, no frequency GI: See HPI. Heme: No unusual bleeding or bruising. Transfusions: See HPI. Neuro:  No headaches, no peripheral tingling or numbness.  No syncope.  No seizures. Derm:  No itching, no rash or sores.  Endocrine:  No sweats or chills.  No polyuria or dysuria Immunization: Not queried. Travel: Recent relocation from [place] to [place].   PHYSICAL EXAM: Vital signs in last 24 hours: Vitals:   12/27/19 0443 12/27/19 1000  BP: (!) 114/58 123/63  Pulse: 72 67  Resp: 18 16  Temp: 98 F (36.7 C) 98.3 F (36.8 C)  SpO2: 98% 97%   Wt Readings from Last 3 Encounters:  12/26/19 89.8 kg    General: Pleasant, aged, somewhat frail and very hard of hearing but comfortable and not acutely ill-appearing. Head: No facial asymmetry or swelling.  No signs of head trauma. Eyes: Conjunctiva slightly pale.  No icterus. Ears: Hard of hearing Nose: No congestion or discharge Mouth: Oropharynx moist, pink, clear.  Tongue midline. Neck: No JVD, no masses, no thyromegaly. Lungs: Clear bilaterally.  No labored breathing or cough. Heart: RRR.  No MRG.  S1, S2 present.  Pacemaker in position in upper left sternum. Abdomen: Soft.  Not tender.  Not distended.  No HSM,  masses, bruits, hernias..   Rectal: Deferred.  FOBT negative per ED exam. Musc/Skeltl: No gross joint redness, deformities or swelling. Extremities: No CCE. Neurologic: Speech is a bit slow.  Had a bit of delay in trouble recalling the year but eventually got it right.  Realizes he is in the hospital in [place].  Appropriate.  Follows commands.  Hard of hearing.  Moves all 4 limbs, no tremors. Skin: No rash, no sores, no telangiectasia. Nodes: No cervical adenopathy. Psych: Cooperative, pleasant, calm.  Intake/Output from previous day: 04/06 0701 - 04/07 0700 In: 945 [I.V.:630; Blood:315] Out: 900 [Urine:900] Intake/Output this shift: No intake/output data recorded.  LAB RESULTS: Recent Labs    12/26/19 1322 12/27/19 0318  WBC 3.3* 3.5*  HGB 6.6* 8.8*  HCT 20.9* 26.2*  PLT 169 164   BMET Lab Results  Component Value Date   NA 140 12/27/2019   NA 135 12/26/2019   K 3.9 12/27/2019   K 4.3 12/26/2019   CL 106 12/27/2019   CL 106 12/26/2019   CO2 25 12/27/2019   CO2 25 12/26/2019   GLUCOSE 95 12/27/2019   GLUCOSE 106 (H) 12/26/2019   BUN 23 12/27/2019   BUN 21 12/26/2019   CREATININE 0.91 12/27/2019   CREATININE 1.01 12/26/2019   CALCIUM 8.7 (L) 12/27/2019   CALCIUM 8.5 (L) 12/26/2019   LFT Recent Labs    12/26/19 1322 12/27/19 0318  PROT 7.3 6.7  ALBUMIN 3.8 3.4*  AST 15 15  ALT 16 14  ALKPHOS 47 42  BILITOT 1.1 1.4*   PT/INR Lab Results  Component Value Date   INR 2.1 (H) 12/27/2019   INR 1.9 (H) 12/26/2019   Hepatitis Panel No results for input(s): HEPBSAG, HCVAB, HEPAIGM, HEPBIGM in the last 72 hours. C-Diff No components found for: CDIFF Lipase  No results found for: LIPASE  Drugs of Abuse  No results found for: LABOPIA, COCAINSCRNUR, LABBENZ, AMPHETMU, THCU, LABBARB   RADIOLOGY STUDIES: DG Chest 2 View  Result Date: 12/26/2019 CLINICAL DATA:  Anemia. EXAM: CHEST - 2 VIEW COMPARISON:  None recent FINDINGS: There is a dual chamber  left-sided pacemaker in place. The heart size is borderline enlarged. The lungs appear somewhat hyperexpanded. There is some atelectasis versus scarring at the lung bases. There are trace to small bilateral pleural effusions, left greater than right. There is some vascular congestion without overt pulmonary edema. Aortic calcifications are noted. There are advanced degenerative changes of the right glenohumeral joint. IMPRESSION: Borderline cardiomegaly with pulmonary vascular congestion. Trace to small bilateral pleural effusions, left greater than right. Bibasilar atelectasis versus scarring. Electronically Signed   By: Constance Holster M.D.   On: 12/26/2019 20:54      IMPRESSION:   *   Anemia  *   Covid 19 positive.  Treated inpt for Covid 19 for a little over 1 week starting on 09/21/2019. No symptoms to suggest ongoing active Covid infection.  CXR shows cardiomegaly, pulmonary vascular congestion and trace to small bilateral pleural effusions and atelectasis versus scarring bilaterally. Given this current COVID-19 positive test, will need to be 2 weeks of quarantine according to the guidelines.  *    Chronic Coumadin.  INR ok.     PLAN:     *   Discharge home.  Has appointment set up with PA for 4/29 Details are in the provider navigator.    Should have frequent checks of his CBC via his doctors making house calls providers.    *   Outpt referral to heme?      Azucena Freed  12/27/2019, 12:12 PM Phone [phone]

## 2019-12-27 NOTE — Progress Notes (Signed)
Occupational Therapy Evaluation Patient Details Name: Ricky [name] MRN: [id number] DOB: 11-Sep-1931 Today's Date: 12/27/2019    History of Present Illness 86 year old gentleman with history of chronic A. fib on Coumadin, pacemaker in situ, chronic anemia who recently moved from [place] to [place] to live with his son.  He had COVID-19 pneumonia on 12/31/ 2020 and was treated in the hospital for 7 days and discharged to a skilled nursing77 year old gentleman with history of chronic A. fib on Coumadin, pacemaker in situ, chronic anemia who recently moved from [place] to [place] to live with his son.  He had COVID-19 pneumonia on 12/31/ 2020 and was treated in the hospital for 7 days and discharged to a skilled nursing facility. After moving to [facility], he was established with home visit, a routine lab test showed hemoglobin of 6.6 and was sent to ER.  Patient denied any symptoms other than feeling weak, he is hard of hearing.   Clinical Impression   Prior to Covid, pt lived with his wife and was independent with ADL and mobility/drove. Since Covid, pt has not returned to baseline and given this recent hospital admission, pt weaker than baseline. Pt would benefit from HHOT/PT to maximize functional level of independence and reduce risks of falls. Son present and can provide necessary level of assistance for safe DC home.     Follow Up Recommendations  Home health OT;Supervision/Assistance - 24 hour    Equipment Recommendations  None recommended by OT    Recommendations for Other Services       Precautions / Restrictions Precautions Precautions: Fall      Mobility Bed Mobility Overal bed mobility: Modified Independent             General bed mobility comments: increased time/use of bed rails. Has bed rails at home  Transfers Overall transfer level: Needs assistance Equipment used: Rolling walker (2 wheeled) Transfers: Sit to/from Stand Sit to Stand: Supervision          General transfer comment: vc for proper hand placement    Balance Overall balance assessment: Needs assistance   Sitting balance-Leahy Scale: Good       Standing balance-Leahy Scale: Fair Standing balance comment: able to release RW for pericare                           ADL either performed or assessed with clinical judgement   ADL Overall ADL's : Needs assistance/impaired                                     Functional mobility during ADLs: Supervision/safety;Rolling walker General ADL Comments: Pt overall set up/S with ADL tasks     Vision Baseline Vision/History: No visual deficits       Perception     Praxis      Pertinent Vitals/Pain Pain Assessment: No/denies pain     Hand Dominance Right   Extremity/Trunk Assessment Upper Extremity Assessment Upper Extremity Assessment: Generalized weakness   Lower Extremity Assessment Lower Extremity Assessment: Generalized weakness   Cervical / Trunk Assessment Cervical / Trunk Assessment: Kyphotic;Other exceptions(forward head)   Communication Communication Communication: HOH   Cognition Arousal/Alertness: Awake/alert Behavior During Therapy: WFL for tasks assessed/performed Overall Cognitive Status: Within Functional Limits for tasks assessed  General Comments: Pt states no change cognitively. Note delay in processing; multiple VC for correct hand placement with RW use   General Comments       Exercises     Shoulder Instructions      Home Living Family/patient expects to be discharged to:: Private residence Living Arrangements: Children Available Help at Discharge: Available 24 hours/day;Family Type of Home: House Home Access: Stairs to enter [employer] of Steps: 3 Entrance Stairs-Rails: Right Home Layout: Two level;Able to live on main level with bedroom/bathroom     Bathroom Shower/Tub: Medical illustrator: Handicapped height Bathroom Accessibility: Yes How Accessible: Accessible via walker Home Equipment: [place] - 2 wheels;Cane - single point;Shower seat;Grab bars - toilet          Prior Functioning/Environment Level of Independence: Independent with assistive device(s)        Comments: uses RW or cane. Has used RW mostly since having Covid. Prior to Covid, he did not use an AD/drove.         OT Problem List: Decreased strength      OT Treatment/Interventions:      OT Goals(Current goals can be found in the care plan section) Acute Rehab OT Goals Patient Stated Goal: to get stronger and get back to normal OT Goal Formulation: With patient/family  OT Frequency:     Barriers to D/C:            Co-evaluation              AM-PAC OT "6 Clicks" Daily Activity     Outcome Measure Help from another person eating meals?: None Help from another person taking care of personal grooming?: A Little Help from another person toileting, which includes using toliet, bedpan, or urinal?: A Little Help from another person bathing (including washing, rinsing, drying)?: A Little Help from another person to put on and taking off regular upper body clothing?: A Little Help from another person to put on and taking off regular lower body clothing?: A Little 6 Click Score: 19   End of Session Equipment Utilized During Treatment: Rolling walker Nurse Communication: Mobility status;Other (comment)(DC needs)  Activity Tolerance: Patient tolerated treatment well Patient left: in bed;with call bell/phone within reach;with family/visitor present  OT Visit Diagnosis: Unsteadiness on feet (R26.81);Muscle weakness (generalized) (M62.81)                Time: 1530-1600 OT Time Calculation (min): 30 min Charges:  OT General Charges $OT Visit: 1 Visit OT Evaluation $OT Eval Moderate Complexity: [place], OT/L   Acute OT Clinical Specialist Acute Rehabilitation  Services Pager [phone] Office [phone]   [facility] 12/27/2019, 4:24 PM

## 2019-12-28 ENCOUNTER — Telehealth: Payer: Self-pay | Admitting: Hematology and Oncology

## 2019-12-28 NOTE — Telephone Encounter (Signed)
Received a new hem referral from the hospital for symptomatic anemia. I cld and spoke to the Ricky [name]'s son to schedule an appt. Ricky [name] has been scheduled to see Dr. Lindi Adie on 5/10 at 1pm. Per Ricky [name]'s son, he's tested positive for covid. Aware to arrive 15 minutes early.

## 2020-01-18 ENCOUNTER — Ambulatory Visit: Payer: Self-pay | Admitting: Physician Assistant

## 2020-01-22 ENCOUNTER — Other Ambulatory Visit: Payer: Medicare Other

## 2020-01-22 ENCOUNTER — Encounter: Payer: Self-pay | Admitting: Physician Assistant

## 2020-01-22 ENCOUNTER — Other Ambulatory Visit (INDEPENDENT_AMBULATORY_CARE_PROVIDER_SITE_OTHER): Payer: Medicare Other

## 2020-01-22 ENCOUNTER — Ambulatory Visit: Payer: Medicare Other | Admitting: Physician Assistant

## 2020-01-22 VITALS — BP 90/50 | HR 84 | Temp 98.3°F | Ht 69.69 in | Wt 185.0 lb

## 2020-01-22 DIAGNOSIS — Z7901 Long term (current) use of anticoagulants: Secondary | ICD-10-CM

## 2020-01-22 DIAGNOSIS — Z860100 Personal history of colon polyps, unspecified: Secondary | ICD-10-CM

## 2020-01-22 DIAGNOSIS — D649 Anemia, unspecified: Secondary | ICD-10-CM

## 2020-01-22 DIAGNOSIS — I480 Paroxysmal atrial fibrillation: Secondary | ICD-10-CM | POA: Insufficient documentation

## 2020-01-22 DIAGNOSIS — I4891 Unspecified atrial fibrillation: Secondary | ICD-10-CM

## 2020-01-22 HISTORY — DX: Personal history of colon polyps, unspecified: Z86.0100

## 2020-01-22 HISTORY — DX: Long term (current) use of anticoagulants: Z79.01

## 2020-01-22 HISTORY — DX: Personal history of colonic polyps: Z86.010

## 2020-01-22 LAB — CBC WITH DIFFERENTIAL/PLATELET
Basophils Absolute: 0 10*3/uL (ref 0.0–0.1)
Basophils Relative: 0.8 % (ref 0.0–3.0)
Eosinophils Absolute: 0.1 10*3/uL (ref 0.0–0.7)
Eosinophils Relative: 1.5 % (ref 0.0–5.0)
HCT: 24.9 % — ABNORMAL LOW (ref 39.0–52.0)
Hemoglobin: 8.6 g/dL — ABNORMAL LOW (ref 13.0–17.0)
Lymphocytes Relative: 16.9 % (ref 12.0–46.0)
Lymphs Abs: 0.9 10*3/uL (ref 0.7–4.0)
MCHC: 34.6 g/dL (ref 30.0–36.0)
MCV: 99.8 fl (ref 78.0–100.0)
Monocytes Absolute: 0.9 10*3/uL (ref 0.1–1.0)
Monocytes Relative: 18 % — ABNORMAL HIGH (ref 3.0–12.0)
Neutro Abs: 3.2 10*3/uL (ref 1.4–7.7)
Neutrophils Relative %: 62.8 % (ref 43.0–77.0)
Platelets: 117 10*3/uL — ABNORMAL LOW (ref 150.0–400.0)
RBC: 2.49 Mil/uL — ABNORMAL LOW (ref 4.22–5.81)
RDW: 24.3 % — ABNORMAL HIGH (ref 11.5–15.5)
WBC: 5.1 10*3/uL (ref 4.0–10.5)

## 2020-01-22 NOTE — Progress Notes (Signed)
Subjective:    Patient ID: Ricky [name], male    DOB: 05-31-1931, 86 y.o.   MRN: :[id number]  HPI Ricky [name] is a pleasant 86 year old white male, who comes in today for follow-up after recent hospitalization.  He was seen in consultation by Dr. Havery Moros on 12/26/2019 for anemia which is chronic.  Patient had labs done as an outpatient which revealed hemoglobin of 6.6 and was therefore admitted for transfusions.  He was given 2 units of packed RBCs and on discharge hemoglobin was 8.8 hematocrit 26.2. He was documented Hemoccult negative. B12 and folate levels were within normal limits, ferritin 720/serum iron 178/TIBC 213/iron saturation 84. It was recommended patient have hematology consultation, and that is scheduled for next week with Dr. Sonny Dandy. Patient has no specific GI complaints, specifically denies any abdominal pain.  His son says that he has had some constipation over the past week and they have been giving him MiraLAX daily at his assisted living facility.  Son mentions that he had been taking Dulcolax at one point previously which is helpful.  Patient denies any melena or hematochezia.  No heartburn or indigestion, no dysphagia, appetite has been fine.  Patient had undergone prior GI evaluation while living in [place].  He has had prior colonoscopies and knows that he has history of polyps.  Last colonoscopy was in 2017.  We do not have a copy of that report today.  They are unclear whether he has had prior EGD. On further conversation with patient's son it sounds as if he has been dealing with anemia at least back to 2017. Patient had copies of labs from October 2020 that showed hemoglobin 8.6 hematocrit of 26.2.  Patient has history of chronic atrial fibrillation for which she is anticoagulated with Coumadin, he status post pacemaker, and has hyperlipidemia.  Was hospitalized in December 2020 with COVID-19 which contributed to debilitation.  Review of Systems Pertinent positive and  negative review of systems were noted in the above HPI section.  All other review of systems was otherwise negative.  Outpatient Encounter Medications as of 01/22/2020  Medication Sig  . cholecalciferol (VITAMIN D3) 25 MCG (1000 UNIT) tablet Take 1,000 Units by mouth daily.  . cyanocobalamin 1000 MCG tablet Take 1,000 mcg by mouth daily.  . Magnesium 250 MG TABS Take 1 tablet (250 mg total) by mouth daily.  . simvastatin (ZOCOR) 20 MG tablet Take 20 mg by mouth daily.  Marland Kitchen warfarin (COUMADIN) 5 MG tablet Take 5 mg by mouth daily.   No facility-administered encounter medications on file as of 01/22/2020.   No Known Allergies Patient Active Problem List   Diagnosis Date Noted  . Atrial fibrillation ([place]) 01/22/2020  . Chronic anticoagulation 01/22/2020  . Hx of colonic polyps 01/22/2020  . Symptomatic anemia 12/26/2019   Social History   Socioeconomic History  . Marital status: Married    Spouse name: Not on file  . Number of children: 2  . Years of education: Not on file  . Highest education level: Not on file  Occupational History  . Occupation: retired  Tobacco Use  . Smoking status: Former Smoker    Types: Cigarettes    Quit date: 01/21/1970    Years since quitting: 50.0  . Smokeless tobacco: Never Used  Substance and Sexual Activity  . Alcohol use: Not Currently  . Drug use: Never  . Sexual activity: Yes  Other Topics Concern  . Not on file  Social History Narrative  . Not on file  Social Determinants of Health   Financial Resource Strain:   . Difficulty of Paying Living Expenses:   Food Insecurity:   . Worried About Charity fundraiser in the Last Year:   . Arboriculturist in the Last Year:   Transportation Needs:   . Film/video editor (Medical):   Marland Kitchen Lack of Transportation (Non-Medical):   Physical Activity:   . Days of Exercise per Week:   . Minutes of Exercise per Session:   Stress:   . Feeling of Stress :   Social Connections:   . Frequency of  Communication with Friends and Family:   . Frequency of Social Gatherings with Friends and Family:   . Attends Religious Services:   . Active Member of Clubs or Organizations:   . Attends Archivist Meetings:   Marland Kitchen Marital Status:   Intimate Partner Violence:   . Fear of Current or Ex-Partner:   . Emotionally Abused:   Marland Kitchen Physically Abused:   . Sexually Abused:     [name] family history includes Alcoholism in his mother; Brain cancer in his daughter; Cirrhosis in his mother; Diabetes in his brother; Heart attack (age of onset: 59) in his father; Heart disease in his brother.      Objective:    Vitals:   01/22/20 1001  BP: (!) 90/50  Pulse: 84  Temp: 98.3 F (36.8 C)    Physical Exam Well-developed well-nourished elderly white male in no acute distress.  Ambulates with a walker, accompanied by his son  , Weight, 185 BMI 26.7  HEENT; nontraumatic normocephalic, EOMI, PE R LA, sclera anicteric. Oropharynx; not examined Neck; supple, no JVD Cardiovascular; regular rate and rhythm with S1-S2, no murmur rub or gallop pacemaker left chest wall Pulmonary; Clear bilaterally Abdomen; soft, nontender, nondistended, no palpable mass or hepatosplenomegaly, bowel sounds are active Rectal; not done today Skin; benign exam, no jaundice rash or appreciable lesions Extremities; no clubbing cyanosis, 1+ edema bilateral lower extremities Neuro/Psych; alert and oriented x4, grossly nonfocal mood and affect appropriate       Assessment & Plan:   #36 86 year old white male with chronic macrocytic anemia, heme negative and not iron or B12 deficient.  By history of the anemia dates back at least a few years, and I am not convinced the anemia is secondary to any chronic GI blood loss.  Patient does have history of colon polyps, is likely up-to-date with colonoscopy even at advanced age with last colonoscopy 2017.  #2 chronic atrial fibrillation #3 status post pacemaker #4  chronic anticoagulation-Coumadin #5 COVID-09 September 2019, hospitalized and debilitated #6 remote history of Boer[place]ave's 2007  Plan; repeat CBC today. Keep appointment with hematology scheduled for next week.  Will await hematology opinion, may benefit from erythropoietin. Patient has signed a release and have requested copies of his colonoscopies, path reports and any EGD in addition to labs for review. We will plan to see him back in 1 month, with Dr. Havery Moros, and can decide at that time if EGD and colonoscopy are necessary.  Maeven Mcdougall S Trust Crago PA-C 01/22/2020   Cc: No ref. provider found

## 2020-01-22 NOTE — Patient Instructions (Addendum)
If you are age 84 or older, your body mass index should be between 23-30. Your Body mass index is 26.79 kg/m. If this is out of the aforementioned range listed, please consider follow up with your Primary Care Provider.  If you are age 78 or younger, your body mass index should be between 19-25. Your Body mass index is 26.79 kg/m. If this is out of the aformentioned range listed, please consider follow up with your Primary Care Provider.   Your provider has requested that you go to the basement level for lab work before leaving today. Press "B" on the elevator. The lab is located at the first door on the left as you exit the elevator.  Due to recent changes in healthcare laws, you may see the results of your imaging and laboratory studies on MyChart before your provider has had a chance to review them.  We understand that in some cases there may be results that are confusing or concerning to you. Not all laboratory results come back in the same time frame and the provider may be waiting for multiple results in order to interpret others.  Please give [place] 48 hours in order for your provider to thoroughly review all the results before contacting the office for clarification of your results.   Please keep your appointment with hematology.  You have been scheduled to follow up with Dr. Havery Moros on February 26, 2020 at 1:20 pm.

## 2020-01-22 NOTE — Progress Notes (Signed)
Agree with assessment and plan as outlined. Chronic anemia, MCV around 100, normal iron studies, heme (-) stools. I agree that GI tract loss is the unlikely case of this with these findings and no overt GI bleeding. Agree with hematology referral and will await results of his prior endoscopic workup to confirm findings.

## 2020-01-28 NOTE — Progress Notes (Signed)
[facility] CONSULT NOTE  Patient Care Team: Patient, No Pcp Per as PCP - General (General Practice)  CHIEF COMPLAINTS/PURPOSE OF CONSULTATION:  Newly diagnosed anemia   HISTORY OF PRESENTING ILLNESS:  Ricky [name] 86 y.o. male is here because of recent diagnosis of anemia. He was admitted from 12/26/19-12/27/19 after routine labs showed Hg 6.6. He received 2 units of PRBCs, and was discharged with Hg 8.8. He has a history of anemia and atrial fibrillation and is on Coumadin. Labs on 01/22/20 showed Hg 8.6, HCT 24.9, platelets 117. He presents to the clinic today for initial evaluation. He moved from [place] to be closer to his family.  He tells me that he may however he was periodically anemic and had only required blood transfusion in December 2020.  He tells me that he has iron overload and has to take a pill to reduce the iron levels in the system. His iron studies show 86% iron saturation with normal B12 levels.  I reviewed his records extensively and collaborated the history with the patient.  MEDICAL HISTORY:  Past Medical History:  Diagnosis Date  . Anemia   . Atrial fibrillation ([place])   . Boerhaave's syndrome   . Colon polyps   . Prostate enlargement     SURGICAL HISTORY: Past Surgical History:  Procedure Laterality Date  . PACEMAKER INSERTION  2009, 2017  . THORACOTOMY     esophageal perforation  . TOTAL HIP ARTHROPLASTY Right   . TOTAL KNEE ARTHROPLASTY Left     SOCIAL HISTORY: Social History   Socioeconomic History  . Marital status: Married    Spouse name: Not on file  . Number of children: 2  . Years of education: Not on file  . Highest education level: Not on file  Occupational History  . Occupation: retired  Tobacco Use  . Smoking status: Former Smoker    Types: Cigarettes    Quit date: 01/21/1970    Years since quitting: 50.0  . Smokeless tobacco: Never Used  Substance and Sexual Activity  . Alcohol use: Not Currently  . Drug use:  Never  . Sexual activity: Yes  Other Topics Concern  . Not on file  Social History Narrative  . Not on file   Social Determinants of Health   Financial Resource Strain:   . Difficulty of Paying Living Expenses:   Food Insecurity:   . Worried About Charity fundraiser in the Last Year:   . Arboriculturist in the Last Year:   Transportation Needs:   . Film/video editor (Medical):   Marland Kitchen Lack of Transportation (Non-Medical):   Physical Activity:   . Days of Exercise per Week:   . Minutes of Exercise per Session:   Stress:   . Feeling of Stress :   Social Connections:   . Frequency of Communication with Friends and Family:   . Frequency of Social Gatherings with Friends and Family:   . Attends Religious Services:   . Active Member of Clubs or Organizations:   . Attends Archivist Meetings:   Marland Kitchen Marital Status:   Intimate Partner Violence:   . Fear of Current or Ex-Partner:   . Emotionally Abused:   Marland Kitchen Physically Abused:   . Sexually Abused:     FAMILY HISTORY: Family History  Problem Relation Age of Onset  . Cirrhosis Mother   . Alcoholism Mother   . Heart attack Father 43  . Heart disease Brother   .  Diabetes Brother   . Brain cancer Daughter     ALLERGIES:  has No Known Allergies.  MEDICATIONS:  Current Outpatient Medications  Medication Sig Dispense Refill  . cholecalciferol (VITAMIN D3) 25 MCG (1000 UNIT) tablet Take 1,000 Units by mouth daily.    . cyanocobalamin 1000 MCG tablet Take 1,000 mcg by mouth daily.    . Magnesium 250 MG TABS Take 1 tablet (250 mg total) by mouth daily.  0  . simvastatin (ZOCOR) 20 MG tablet Take 20 mg by mouth daily.    Marland Kitchen warfarin (COUMADIN) 5 MG tablet Take 5 mg by mouth daily.     No current facility-administered medications for this visit.    REVIEW OF SYSTEMS:   Constitutional: Denies fevers, chills or abnormal night sweats Eyes: Denies blurriness of vision, double vision or watery eyes Ears, nose, mouth,  throat, and face: Denies mucositis or sore throat Respiratory: Denies cough, dyspnea or wheezes Cardiovascular: Denies palpitation, chest discomfort or lower extremity swelling Gastrointestinal:  Denies nausea, heartburn or change in bowel habits Skin: Denies abnormal skin rashes Lymphatics: Denies new lymphadenopathy or easy bruising Neurological:Denies numbness, tingling or new weaknesses Behavioral/Psych: Mood is stable, no new changes  All other systems were reviewed with the patient and are negative.  PHYSICAL EXAMINATION: ECOG PERFORMANCE STATUS: 1 - Symptomatic but completely ambulatory  Vitals:   01/29/20 1307  BP: 113/65  Pulse: 91  Resp: 17  Temp: 98.3 F (36.8 C)  SpO2: 100%   Filed Weights   01/29/20 1307  Weight: 184 lb 3.2 oz (83.6 kg)    GENERAL:alert, no distress and comfortable SKIN: skin color, texture, turgor are normal, no rashes or significant lesions EYES: normal, conjunctiva are pink and non-injected, sclera clear OROPHARYNX:no exudate, no erythema and lips, buccal mucosa, and tongue normal  NECK: supple, thyroid normal size, non-tender, without nodularity LYMPH:  no palpable lymphadenopathy in the cervical, axillary or inguinal LUNGS: clear to auscultation and percussion with normal breathing effort HEART: regular rate & rhythm and no murmurs and no lower extremity edema ABDOMEN:abdomen soft, non-tender and normal bowel sounds Musculoskeletal:no cyanosis of digits and no clubbing  PSYCH: alert & oriented x 3 with fluent speech NEURO: no focal motor/sensory deficits  LABORATORY DATA:  I have reviewed the data as listed Lab Results  Component Value Date   WBC 3.8 (L) 01/29/2020   HGB 7.8 (L) 01/29/2020   HCT 24.2 (L) 01/29/2020   MCV 103.4 (H) 01/29/2020   PLT 149 (L) 01/29/2020   Lab Results  Component Value Date   NA 140 12/27/2019   K 3.9 12/27/2019   CL 106 12/27/2019   CO2 25 12/27/2019    RADIOGRAPHIC STUDIES: I have personally  reviewed the radiological reports and agreed with the findings in the report.  ASSESSMENT AND PLAN:  Symptomatic anemia Normocytic to macrocytic anemia: Hemoglobin 8.6 when he left the hospital after 2 units of PRBC 01/29/2020: Hemoglobin 7.8, MCV 103.4, platelets 149 Gastroenterology evaluation: Did not feel like he is a candidate for endoscopies or biopsies.  Treatment plan: 1.  Send for CBC with differential, after LDH and reticulocyte count, serum protein electrophoresis, TSH as well as type. 2.  Discussed the differential diagnosis of normocytic anemia's.  Iron overload: High iron saturation and high ferritin: He tells me that he takes the medication for iron overload.  If his diagnosis hemochromatosis, we need to do genetics for hemochromatosis.  I will discuss with him the results in 1 week. With a hemoglobin of  7.8, I did not recommend blood transfusion because of his iron overload issues.  We will make further appointments based upon the rest of the blood work results.    All questions were answered. The patient knows to call the clinic with any problems, questions or concerns.   Rulon Eisenmenger, MD, MPH 01/29/2020    I, Molly Dorshimer, am acting as scribe for Nicholas Lose, MD.  I have reviewed the above documentation for accuracy and completeness, and I agree with the above.

## 2020-01-29 ENCOUNTER — Inpatient Hospital Stay: Payer: Medicare Other

## 2020-01-29 ENCOUNTER — Inpatient Hospital Stay: Payer: Medicare Other | Attending: Hematology and Oncology | Admitting: Hematology and Oncology

## 2020-01-29 ENCOUNTER — Other Ambulatory Visit: Payer: Self-pay

## 2020-01-29 VITALS — BP 113/65 | HR 91 | Temp 98.3°F | Resp 17 | Ht 69.69 in | Wt 184.2 lb

## 2020-01-29 DIAGNOSIS — I4891 Unspecified atrial fibrillation: Secondary | ICD-10-CM | POA: Diagnosis not present

## 2020-01-29 DIAGNOSIS — D649 Anemia, unspecified: Secondary | ICD-10-CM | POA: Insufficient documentation

## 2020-01-29 DIAGNOSIS — Z79899 Other long term (current) drug therapy: Secondary | ICD-10-CM | POA: Insufficient documentation

## 2020-01-29 DIAGNOSIS — Z7901 Long term (current) use of anticoagulants: Secondary | ICD-10-CM | POA: Diagnosis not present

## 2020-01-29 DIAGNOSIS — Z87891 Personal history of nicotine dependence: Secondary | ICD-10-CM | POA: Diagnosis not present

## 2020-01-29 LAB — CBC WITH DIFFERENTIAL (CANCER CENTER ONLY)
Abs Immature Granulocytes: 0 10*3/uL (ref 0.00–0.07)
Basophils Absolute: 0 10*3/uL (ref 0.0–0.1)
Basophils Relative: 0 %
Eosinophils Absolute: 0.1 10*3/uL (ref 0.0–0.5)
Eosinophils Relative: 3 %
HCT: 24.2 % — ABNORMAL LOW (ref 39.0–52.0)
Hemoglobin: 7.8 g/dL — ABNORMAL LOW (ref 13.0–17.0)
Immature Granulocytes: 0 %
Lymphocytes Relative: 27 %
Lymphs Abs: 1 10*3/uL (ref 0.7–4.0)
MCH: 33.3 pg (ref 26.0–34.0)
MCHC: 32.2 g/dL (ref 30.0–36.0)
MCV: 103.4 fL — ABNORMAL HIGH (ref 80.0–100.0)
Monocytes Absolute: 0.7 10*3/uL (ref 0.1–1.0)
Monocytes Relative: 19 %
Neutro Abs: 2 10*3/uL (ref 1.7–7.7)
Neutrophils Relative %: 51 %
Platelet Count: 149 10*3/uL — ABNORMAL LOW (ref 150–400)
RBC: 2.34 MIL/uL — ABNORMAL LOW (ref 4.22–5.81)
RDW: 23.2 % — ABNORMAL HIGH (ref 11.5–15.5)
WBC Count: 3.8 10*3/uL — ABNORMAL LOW (ref 4.0–10.5)
nRBC: 0 % (ref 0.0–0.2)

## 2020-01-29 LAB — DIRECT ANTIGLOBULIN TEST (NOT AT ARMC)
DAT, IgG: POSITIVE
DAT, complement: NEGATIVE

## 2020-01-29 LAB — RETICULOCYTES
Immature Retic Fract: 16.9 % — ABNORMAL HIGH (ref 2.3–15.9)
RBC.: 2.32 MIL/uL — ABNORMAL LOW (ref 4.22–5.81)
Retic Count, Absolute: 45.5 10*3/uL (ref 19.0–186.0)
Retic Ct Pct: 2 % (ref 0.4–3.1)

## 2020-01-29 LAB — TSH: TSH: 4.308 u[IU]/mL — ABNORMAL HIGH (ref 0.320–4.118)

## 2020-01-29 LAB — LACTATE DEHYDROGENASE: LDH: 147 U/L (ref 98–192)

## 2020-01-29 NOTE — Progress Notes (Signed)
Blood bank hold for pt hgb 7.8. Dr.Gudena made aware.

## 2020-01-29 NOTE — Assessment & Plan Note (Signed)
Normocytic to macrocytic anemia: Hemoglobin 8.6 when he left the hospital after 2 units of PRBC 01/29/2020: Hemoglobin 7.8, MCV 103.4, platelets 149 Gastroenterology evaluation: Did not feel like he is a candidate for endoscopies or biopsies.  Treatment plan: 1.  Send for CBC with differential, after LDH and reticulocyte count, serum protein electrophoresis, TSH as well as type. 2.  Discussed the differential diagnosis of normocytic anemia's.  Iron overload: High iron saturation and high ferritin: He tells me that he takes the medication for iron overload.  If his diagnosis hemochromatosis, we need to do genetics for hemochromatosis.  I will discuss with him the results in 1 week. With a hemoglobin of 7.8, I did not recommend blood transfusion because of his iron overload issues.  We will make further appointments based upon the rest of the blood work results.

## 2020-01-30 LAB — HAPTOGLOBIN: Haptoglobin: 69 mg/dL (ref 38–329)

## 2020-01-31 LAB — MULTIPLE MYELOMA PANEL, SERUM
Albumin SerPl Elph-Mcnc: 4 g/dL (ref 2.9–4.4)
Albumin/Glob SerPl: 1.2 (ref 0.7–1.7)
Alpha 1: 0.2 g/dL (ref 0.0–0.4)
Alpha2 Glob SerPl Elph-Mcnc: 0.5 g/dL (ref 0.4–1.0)
B-Globulin SerPl Elph-Mcnc: 0.9 g/dL (ref 0.7–1.3)
Gamma Glob SerPl Elph-Mcnc: 1.9 g/dL — ABNORMAL HIGH (ref 0.4–1.8)
Globulin, Total: 3.4 g/dL (ref 2.2–3.9)
IgA: 225 mg/dL (ref 61–437)
IgG (Immunoglobin G), Serum: 1983 mg/dL — ABNORMAL HIGH (ref 603–1613)
IgM (Immunoglobulin M), Srm: 261 mg/dL — ABNORMAL HIGH (ref 15–143)
Total Protein ELP: 7.4 g/dL (ref 6.0–8.5)

## 2020-01-31 LAB — TYPE AND SCREEN
ABO/RH(D): B POS
Antibody Screen: POSITIVE
DAT, IgG: POSITIVE

## 2020-02-02 ENCOUNTER — Telehealth: Payer: Self-pay | Admitting: Hematology and Oncology

## 2020-02-02 ENCOUNTER — Other Ambulatory Visit: Payer: Self-pay | Admitting: Hematology and Oncology

## 2020-02-02 DIAGNOSIS — D638 Anemia in other chronic diseases classified elsewhere: Secondary | ICD-10-CM

## 2020-02-02 DIAGNOSIS — D4620 Refractory anemia with excess of blasts, unspecified: Secondary | ICD-10-CM

## 2020-02-02 DIAGNOSIS — D469 Myelodysplastic syndrome, unspecified: Secondary | ICD-10-CM | POA: Insufficient documentation

## 2020-02-02 HISTORY — DX: Anemia in other chronic diseases classified elsewhere: D63.8

## 2020-02-02 HISTORY — DX: Refractory anemia with excess of blasts, unspecified: D46.20

## 2020-02-02 LAB — HEMOCHROMATOSIS DNA-PCR(C282Y,H63D)

## 2020-02-02 NOTE — Telephone Encounter (Signed)
Hematology I discussed with the patient's son about the blood work that we had done recently.  His hemoglobin is low but he is not any different than previously.  The results of the blood work show negative results for multiple myeloma. The IgG direct Coombs test was positive but I believe it is a false positive reading because the LDH is normal. The low reticulocyte counts indicate poor bone marrow function and decreased production as the primary etiology for his anemia. I believe the anemia is due to anemia of chronic disease but MDS cannot be ruled out.  For either of these conditions it is reasonable to treat him with the erythropoietin stimulating agent. I will request Aranesp injections to be started. We will see him in follow-up with lab work to assess response to treatment.

## 2020-02-02 NOTE — Telephone Encounter (Signed)
Scheduled per 5/14 sch messsage. Spoke with pt's son. Pt aware of appts on 5/21.

## 2020-02-02 NOTE — Progress Notes (Signed)
Macrocytic anemia: There is a clinical suspicion for low-grade MDS.  I recommended Aranesp therapy.

## 2020-02-08 ENCOUNTER — Other Ambulatory Visit: Payer: Self-pay | Admitting: *Deleted

## 2020-02-08 DIAGNOSIS — D638 Anemia in other chronic diseases classified elsewhere: Secondary | ICD-10-CM

## 2020-02-09 ENCOUNTER — Inpatient Hospital Stay: Payer: Medicare Other

## 2020-02-09 ENCOUNTER — Other Ambulatory Visit: Payer: Self-pay | Admitting: *Deleted

## 2020-02-09 ENCOUNTER — Other Ambulatory Visit: Payer: Self-pay | Admitting: Hematology and Oncology

## 2020-02-09 ENCOUNTER — Other Ambulatory Visit: Payer: Self-pay

## 2020-02-09 VITALS — BP 123/51 | HR 73 | Temp 98.7°F | Resp 18

## 2020-02-09 DIAGNOSIS — D638 Anemia in other chronic diseases classified elsewhere: Secondary | ICD-10-CM

## 2020-02-09 DIAGNOSIS — D649 Anemia, unspecified: Secondary | ICD-10-CM | POA: Diagnosis not present

## 2020-02-09 DIAGNOSIS — D4620 Refractory anemia with excess of blasts, unspecified: Secondary | ICD-10-CM

## 2020-02-09 LAB — CBC WITH DIFFERENTIAL (CANCER CENTER ONLY)
Abs Immature Granulocytes: 0.02 10*3/uL (ref 0.00–0.07)
Basophils Absolute: 0 10*3/uL (ref 0.0–0.1)
Basophils Relative: 1 %
Eosinophils Absolute: 0.1 10*3/uL (ref 0.0–0.5)
Eosinophils Relative: 3 %
HCT: 22.5 % — ABNORMAL LOW (ref 39.0–52.0)
Hemoglobin: 7.2 g/dL — ABNORMAL LOW (ref 13.0–17.0)
Immature Granulocytes: 1 %
Lymphocytes Relative: 28 %
Lymphs Abs: 1.2 10*3/uL (ref 0.7–4.0)
MCH: 33.5 pg (ref 26.0–34.0)
MCHC: 32 g/dL (ref 30.0–36.0)
MCV: 104.7 fL — ABNORMAL HIGH (ref 80.0–100.0)
Monocytes Absolute: 0.7 10*3/uL (ref 0.1–1.0)
Monocytes Relative: 17 %
Neutro Abs: 2.2 10*3/uL (ref 1.7–7.7)
Neutrophils Relative %: 50 %
Platelet Count: 161 10*3/uL (ref 150–400)
RBC: 2.15 MIL/uL — ABNORMAL LOW (ref 4.22–5.81)
RDW: 24.4 % — ABNORMAL HIGH (ref 11.5–15.5)
WBC Count: 4.2 10*3/uL (ref 4.0–10.5)
nRBC: 0 % (ref 0.0–0.2)

## 2020-02-09 LAB — CMP (CANCER CENTER ONLY)
ALT: 11 U/L (ref 0–44)
AST: 12 U/L — ABNORMAL LOW (ref 15–41)
Albumin: 3.8 g/dL (ref 3.5–5.0)
Alkaline Phosphatase: 57 U/L (ref 38–126)
Anion gap: 7 (ref 5–15)
BUN: 18 mg/dL (ref 8–23)
CO2: 27 mmol/L (ref 22–32)
Calcium: 8.5 mg/dL — ABNORMAL LOW (ref 8.9–10.3)
Chloride: 106 mmol/L (ref 98–111)
Creatinine: 1 mg/dL (ref 0.61–1.24)
GFR, Est AFR Am: >60 mL/min (ref >60)
GFR, Estimated: >60 mL/min (ref >60)
Glucose, Bld: 105 mg/dL — ABNORMAL HIGH (ref 70–99)
Potassium: 4.7 mmol/L (ref 3.5–5.1)
Sodium: 140 mmol/L (ref 135–145)
Total Bilirubin: 0.8 mg/dL (ref 0.3–1.2)
Total Protein: 7.2 g/dL (ref 6.5–8.1)

## 2020-02-09 LAB — PREPARE RBC (CROSSMATCH)

## 2020-02-09 MED ORDER — DARBEPOETIN ALFA 300 MCG/0.6ML IJ SOSY
300.0000 ug | PREFILLED_SYRINGE | Freq: Once | INTRAMUSCULAR | Status: AC
  Administered 2020-02-09: 300 ug via SUBCUTANEOUS

## 2020-02-09 MED ORDER — DARBEPOETIN ALFA 300 MCG/0.6ML IJ SOSY
PREFILLED_SYRINGE | INTRAMUSCULAR | Status: AC
  Filled 2020-02-09: qty 0.6

## 2020-02-09 NOTE — Progress Notes (Signed)
Per MD patient will need 2 units blood today. Informed son of the plan.

## 2020-02-09 NOTE — Patient Instructions (Signed)
Darbepoetin Alfa injection What is this medicine? DARBEPOETIN ALFA (dar be POE e tin AL fa) helps your body make more red blood cells. It is used to treat anemia caused by chronic kidney failure and chemotherapy. This medicine may be used for other purposes; ask your health care provider or pharmacist if you have questions. COMMON BRAND NAME(S): Aranesp What should I tell my health care provider before I take this medicine? They need to know if you have any of these conditions:  blood clotting disorders or history of blood clots  cancer patient not on chemotherapy  cystic fibrosis  heart disease, such as angina, heart failure, or a history of a heart attack  hemoglobin level of 12 g/dL or greater  high blood pressure  low levels of folate, iron, or vitamin B12  seizures  an unusual or allergic reaction to darbepoetin, erythropoietin, albumin, hamster proteins, latex, other medicines, foods, dyes, or preservatives  pregnant or trying to get pregnant  breast-feeding How should I use this medicine? This medicine is for injection into a vein or under the skin. It is usually given by a health care professional in a hospital or clinic setting. If you get this medicine at home, you will be taught how to prepare and give this medicine. Use exactly as directed. Take your medicine at regular intervals. Do not take your medicine more often than directed. It is important that you put your used needles and syringes in a special sharps container. Do not put them in a trash can. If you do not have a sharps container, call your pharmacist or healthcare provider to get one. A special MedGuide will be given to you by the pharmacist with each prescription and refill. Be sure to read this information carefully each time. Talk to your pediatrician regarding the use of this medicine in children. While this medicine may be used in children as young as 1 month of age for selected conditions, precautions do  apply. Overdosage: If you think you have taken too much of this medicine contact a poison control center or emergency room at once. NOTE: This medicine is only for you. Do not share this medicine with others. What if I miss a dose? If you miss a dose, take it as soon as you can. If it is almost time for your next dose, take only that dose. Do not take double or extra doses. What may interact with this medicine? Do not take this medicine with any of the following medications:  epoetin alfa This list may not describe all possible interactions. Give your health care provider a list of all the medicines, herbs, non-prescription drugs, or dietary supplements you use. Also tell them if you smoke, drink alcohol, or use illegal drugs. Some items may interact with your medicine. What should I watch for while using this medicine? Your condition will be monitored carefully while you are receiving this medicine. You may need blood work done while you are taking this medicine. This medicine may cause a decrease in vitamin B6. You should make sure that you get enough vitamin B6 while you are taking this medicine. Discuss the foods you eat and the vitamins you take with your health care professional. What side effects may I notice from receiving this medicine? Side effects that you should report to your doctor or health care professional as soon as possible:  allergic reactions like skin rash, itching or hives, swelling of the face, lips, or tongue  breathing problems  changes in   vision  chest pain  confusion, trouble speaking or understanding  feeling faint or lightheaded, falls  high blood pressure  muscle aches or pains  pain, swelling, warmth in the leg  rapid weight gain  severe headaches  sudden numbness or weakness of the face, arm or leg  trouble walking, dizziness, loss of balance or coordination  seizures (convulsions)  swelling of the ankles, feet, hands  unusually weak or  tired Side effects that usually do not require medical attention (report to your doctor or health care professional if they continue or are bothersome):  diarrhea  fever, chills (flu-like symptoms)  headaches  nausea, vomiting  redness, stinging, or swelling at site where injected This list may not describe all possible side effects. Call your doctor for medical advice about side effects. You may report side effects to FDA at 1-800-FDA-1088. Where should I keep my medicine? Keep out of the reach of children. Store in a refrigerator between 2 and 8 degrees C (36 and 46 degrees F). Do not freeze. Do not shake. Throw away any unused portion if using a single-dose vial. Throw away any unused medicine after the expiration date. NOTE: This sheet is a summary. It may not cover all possible information. If you have questions about this medicine, talk to your doctor, pharmacist, or health care provider.  2020 Elsevier/Gold Standard (2017-09-22 16:44:20)  

## 2020-02-09 NOTE — Progress Notes (Signed)
Pt Hgb 7.1.  Per MD pt to receive 2 units of blood today.  Orders placed.

## 2020-02-12 ENCOUNTER — Other Ambulatory Visit: Payer: Self-pay

## 2020-02-12 ENCOUNTER — Inpatient Hospital Stay: Payer: Medicare Other

## 2020-02-12 DIAGNOSIS — D638 Anemia in other chronic diseases classified elsewhere: Secondary | ICD-10-CM

## 2020-02-12 DIAGNOSIS — D649 Anemia, unspecified: Secondary | ICD-10-CM | POA: Diagnosis not present

## 2020-02-12 MED ORDER — SODIUM CHLORIDE 0.9% IV SOLUTION
250.0000 mL | Freq: Once | INTRAVENOUS | Status: AC
  Administered 2020-02-12: 250 mL via INTRAVENOUS
  Filled 2020-02-12: qty 250

## 2020-02-12 MED ORDER — ACETAMINOPHEN 325 MG PO TABS
650.0000 mg | ORAL_TABLET | Freq: Once | ORAL | Status: AC
  Administered 2020-02-12: 650 mg via ORAL

## 2020-02-12 MED ORDER — SODIUM CHLORIDE 0.9% FLUSH
10.0000 mL | INTRAVENOUS | Status: DC | PRN
  Filled 2020-02-12: qty 10

## 2020-02-12 MED ORDER — ACETAMINOPHEN 325 MG PO TABS
ORAL_TABLET | ORAL | Status: AC
  Filled 2020-02-12: qty 2

## 2020-02-12 MED ORDER — DIPHENHYDRAMINE HCL 25 MG PO CAPS
ORAL_CAPSULE | ORAL | Status: AC
  Filled 2020-02-12: qty 1

## 2020-02-12 MED ORDER — DIPHENHYDRAMINE HCL 25 MG PO CAPS
25.0000 mg | ORAL_CAPSULE | Freq: Once | ORAL | Status: AC
  Administered 2020-02-12: 25 mg via ORAL

## 2020-02-12 NOTE — Patient Instructions (Signed)

## 2020-02-13 LAB — TYPE AND SCREEN
ABO/RH(D): B POS
Antibody Screen: POSITIVE
DAT, IgG: POSITIVE
Donor AG Type: NEGATIVE
Donor AG Type: NEGATIVE
Unit division: 0
Unit division: 0

## 2020-02-13 LAB — BPAM RBC
Blood Product Expiration Date: 202106222359
Blood Product Expiration Date: 202106222359
ISSUE DATE / TIME: 202105240837
ISSUE DATE / TIME: 202105240837
Unit Type and Rh: 5100
Unit Type and Rh: 5100

## 2020-02-26 ENCOUNTER — Ambulatory Visit: Payer: Medicare Other | Admitting: Gastroenterology

## 2020-02-28 ENCOUNTER — Encounter: Payer: Self-pay | Admitting: Cardiovascular Disease

## 2020-02-28 ENCOUNTER — Other Ambulatory Visit: Payer: Self-pay

## 2020-02-28 ENCOUNTER — Ambulatory Visit (INDEPENDENT_AMBULATORY_CARE_PROVIDER_SITE_OTHER): Payer: Medicare Other | Admitting: Cardiovascular Disease

## 2020-02-28 VITALS — BP 108/64 | HR 66 | Ht 69.0 in | Wt 186.0 lb

## 2020-02-28 DIAGNOSIS — E785 Hyperlipidemia, unspecified: Secondary | ICD-10-CM

## 2020-02-28 DIAGNOSIS — I4891 Unspecified atrial fibrillation: Secondary | ICD-10-CM

## 2020-02-28 NOTE — Progress Notes (Signed)
Cardiology Office Note:    Date:  02/28/2020   ID:  Ricky [name], DOB February 26, 1931, MRN [id number]  PCP:  Patient, No Pcp Per  [facility] Cardiologist:  New to [place] Electrophysiologist:  None   Referring MD: Ricky [name]*   Chief Complaint  Patient presents with  . Atrial Fibrillation     History of Present Illness:    Ricky [name] is a 86 y.o. male with a hx of atrial fib, pacer, chronic anticoagulation .   We were asked to see him today by Clide Deutscher, NP for further management of his atrial fib and pacer.   He is on coumadin   No CP or dyspnea.  Has slowed down  Lives in [place] now.   No syncope .  Has chronic anemia Hb is 7.2 as of last month   Hx of HLD .    Very little exercise .  Due to lack of energy   Had pacer placed up in [place] several years ago.  Does not know what brand    Past Medical History:  Diagnosis Date  . A-fib ([place])   . Anemia   . Anemia of chronic disease 02/02/2020  . Atrial fibrillation ([place])   . Boerhaave's syndrome   . Chronic anticoagulation 01/22/2020  . Colon polyps   . COVID-19   . Hx of colonic polyps 01/22/2020   2017 - [place]  . Hypomagnesemia   . Low grade myelodysplastic syndrome lesions ([place]) 02/02/2020  . Lower urinary tract symptoms due to benign prostatic hyperplasia   . Pacemaker   . Paroxysmal atrial fibrillation ([facility])   . Prostate enlargement   . Symptomatic anemia 12/26/2019  . Vitamin D deficiency     Past Surgical History:  Procedure Laterality Date  . esophogeal tear repair    . PACEMAKER INSERTION  2009, 2017  . THORACOTOMY     esophageal perforation  . TOTAL HIP ARTHROPLASTY Right   . TOTAL KNEE ARTHROPLASTY Left     Current Medications: Current Meds  Medication Sig  . cholecalciferol (VITAMIN D3) 25 MCG (1000 UNIT) tablet Take 1,000 Units by mouth daily.  . cyanocobalamin 1000 MCG tablet Take 1,000 mcg by mouth daily.  . Magnesium 250 MG TABS Take 1 tablet  (250 mg total) by mouth daily.  . simvastatin (ZOCOR) 20 MG tablet Take 20 mg by mouth daily.  Marland Kitchen warfarin (COUMADIN) 5 MG tablet Take 5 mg by mouth daily.     Allergies:   Patient has no known allergies.   Social History   Socioeconomic History  . Marital status: Married    Spouse name: Not on file  . Number of children: 2  . Years of education: Not on file  . Highest education level: Not on file  Occupational History  . Occupation: retired  Tobacco Use  . Smoking status: Former Smoker    Types: Cigarettes    Quit date: 01/21/1970    Years since quitting: 50.1  . Smokeless tobacco: Never Used  Substance and Sexual Activity  . Alcohol use: Not Currently  . Drug use: Never  . Sexual activity: Yes  Other Topics Concern  . Not on file  Social History Narrative  . Not on file   Social Determinants of Health   Financial Resource Strain:   . Difficulty of Paying Living Expenses:   Food Insecurity:   . Worried About Charity fundraiser in the Last Year:   . Ran  Out of Food in the Last Year:   Transportation Needs:   . Lack of Transportation (Medical):   Marland Kitchen Lack of Transportation (Non-Medical):   Physical Activity:   . Days of Exercise per Week:   . Minutes of Exercise per Session:   Stress:   . Feeling of Stress :   Social Connections:   . Frequency of Communication with Friends and Family:   . Frequency of Social Gatherings with Friends and Family:   . Attends Religious Services:   . Active Member of Clubs or Organizations:   . Attends Archivist Meetings:   Marland Kitchen Marital Status:      Family History: The patient's family history includes Alcoholism in his mother; Brain cancer in his daughter; Cirrhosis in his mother; Diabetes in his brother; Heart attack (age of onset: 26) in his father; Heart disease in his brother.  ROS:   Please see the history of present illness.     All other systems reviewed and are negative.  EKGs/Labs/Other Studies Reviewed:    The  following studies were reviewed today:   EKG:   February 28, 2020: Normal sinus rhythm at 66 beats a minute.  Small inferior Q waves.  No ST or T wave changes.  Recent Labs: 01/29/2020: TSH 4.308 02/09/2020: ALT 11; BUN 18; Creatinine 1.00; Hemoglobin 7.2; Platelet Count 161; Potassium 4.7; Sodium 140  Recent Lipid Panel No results found for: CHOL, TRIG, HDL, CHOLHDL, VLDL, LDLCALC, LDLDIRECT  Physical Exam:    VS:  BP 108/64   Pulse 66   Ht 5\' 9"  (1.753 m)   Wt 186 lb (84.4 kg)   SpO2 96%   BMI 27.47 kg/m     Wt Readings from Last 3 Encounters:  02/28/20 186 lb (84.4 kg)  01/29/20 184 lb 3.2 oz (83.6 kg)  01/22/20 185 lb (83.9 kg)     GEN:  Elderly male,  NAD , hard of hearing  HEENT: Normal NECK: No JVD; No carotid bruits LYMPHATICS: No lymphadenopathy CARDIAC: RRR, no murmurs, rubs, gallops RESPIRATORY:  Clear to auscultation without rales, wheezing or rhonchi  ABDOMEN: Soft, non-tender, non-distended MUSCULOSKELETAL:  No edema; No deformity  SKIN: Warm and dry NEUROLOGIC:  Alert and oriented x 3 PSYCHIATRIC:  Normal affect   ASSESSMENT:    1. Atrial fibrillation, unspecified type ([place])   2. Hyperlipidemia, unspecified hyperlipidemia type    PLAN:    In order of problems listed above:  1. Paroxysmal atrial fib : hx of PAF.    Appears to be in sinus rhythm today .  He is on coumadin  He has no angina , no dyspnea He does have anemia and has seen GI .    Suggest aiming for an INR as close to 2.0 or perhaps changing to Eliquis which might pose a lower risk fo GI bleeding  2.   Pacemaker:  He does not know which type of pacemaker he has.  We will see if we get it interrogated today to get some background information.  I will refer him to electrophysiology.    Marland Kitchen  He does have a history of hyperlipidemia which appears to be managed by his primary medical doctor.   Will have him see EP in 6 months .    Medication Adjustments/Labs and Tests Ordered: Current medicines are  reviewed at length with the patient today.  Concerns regarding medicines are outlined above.  Orders Placed This Encounter  Procedures  . Protime-INR  . Lipid panel  .  Ambulatory referral to Cardiac Electrophysiology  . EKG 12-Lead   No orders of the defined types were placed in this encounter.   Patient Instructions  Medication Instructions:  Your physician recommends that you continue on your current medications as directed. Please refer to the Current Medication list given to you today.  *If you need a refill on your cardiac medications before your next appointment, please call your pharmacy*   Lab Work: INR and Lipids today  If you have labs (blood work) drawn today and your tests are completely normal, you will receive your results only by: Marland Kitchen MyChart Message (if you have MyChart) OR . A paper copy in the mail If you have any lab test that is abnormal or we need to change your treatment, we will call you to review the results.   Testing/Procedures: None   Follow-Up:  You will need to establish with our Coumadin Clinic to help with monitoring your coumadin level.   At [facility], you and your health needs are our priority.  As part of our continuing mission to provide you with exceptional heart care, we have created designated Provider Care Teams.  These Care Teams include your primary Cardiologist (physician) and Advanced Practice Providers (APPs -  Physician Assistants and Nurse Practitioners) who all work together to provide you with the care you need, when you need it.  We recommend signing up for the patient portal called "MyChart".  Sign up information is provided on this After Visit Summary.  MyChart is used to connect with patients for Virtual Visits (Telemedicine).  Patients are able to view lab/test results, encounter notes, upcoming appointments, etc.  Non-urgent messages can be sent to your provider as well.   To learn more about what you can do with MyChart,  go to [email or web address].    Your next appointment:   6 month(s)  The format for your next appointment:   In Person  Provider:   You may see one of our Electrophysiologist or one of the following Advanced Practice Providers on your designated Care Team:    Chanetta Marshall, NP  Tommye Standard, PA-C  Legrand Como "Oda Kilts, [place]    Other Instructions      Signed, Mertie Moores, MD  02/28/2020 1:56 PM    [place]

## 2020-02-28 NOTE — Patient Instructions (Addendum)
Medication Instructions:  Your physician recommends that you continue on your current medications as directed. Please refer to the Current Medication list given to you today.  *If you need a refill on your cardiac medications before your next appointment, please call your pharmacy*   Lab Work: INR and Lipids today  If you have labs (blood work) drawn today and your tests are completely normal, you will receive your results only by: Marland Kitchen MyChart Message (if you have MyChart) OR . A paper copy in the mail If you have any lab test that is abnormal or we need to change your treatment, we will call you to review the results.   Testing/Procedures: None   Follow-Up:  You will need to establish with our Coumadin Clinic to help with monitoring your coumadin level.   At [facility], you and your health needs are our priority.  As part of our continuing mission to provide you with exceptional heart care, we have created designated Provider Care Teams.  These Care Teams include your primary Cardiologist (physician) and Advanced Practice Providers (APPs -  Physician Assistants and Nurse Practitioners) who all work together to provide you with the care you need, when you need it.  We recommend signing up for the patient portal called "MyChart".  Sign up information is provided on this After Visit Summary.  MyChart is used to connect with patients for Virtual Visits (Telemedicine).  Patients are able to view lab/test results, encounter notes, upcoming appointments, etc.  Non-urgent messages can be sent to your provider as well.   To learn more about what you can do with MyChart, go to [email or web address].    Your next appointment:   Next available  The format for your next appointment:   In Person  Provider:   You may see one of our Electrophysiologist or one of the following Advanced Practice Providers on your designated Care Team:    Chanetta Marshall, NP  Tommye Standard, PA-C  Legrand Como  "Oda Kilts, [place]    Other Instructions

## 2020-02-29 ENCOUNTER — Telehealth: Payer: Self-pay | Admitting: Hematology and Oncology

## 2020-02-29 ENCOUNTER — Telehealth: Payer: Self-pay | Admitting: Cardiovascular Disease

## 2020-02-29 ENCOUNTER — Other Ambulatory Visit: Payer: Self-pay | Admitting: *Deleted

## 2020-02-29 DIAGNOSIS — D638 Anemia in other chronic diseases classified elsewhere: Secondary | ICD-10-CM

## 2020-02-29 LAB — PROTIME-INR
INR: 1 (ref 0.9–1.2)
Prothrombin Time: 11.1 s (ref 9.1–12.0)

## 2020-02-29 LAB — LIPID PANEL
Chol/HDL Ratio: 2.6 ratio (ref 0.0–5.0)
Cholesterol, Total: 139 mg/dL (ref 100–199)
HDL: 53 mg/dL (ref >39)
LDL Chol Calc (NIH): 73 mg/dL (ref 0–99)
Triglycerides: 60 mg/dL (ref 0–149)
VLDL Cholesterol Cal: 13 mg/dL (ref 5–40)

## 2020-02-29 MED ORDER — APIXABAN 5 MG PO TABS: 5.0000 mg | ORAL_TABLET | Freq: Two times a day (BID) | ORAL | 1 refills | Status: DC

## 2020-02-29 MED ORDER — APIXABAN 5 MG PO TABS: 5.0000 mg | ORAL_TABLET | Freq: Two times a day (BID) | ORAL | 5 refills | Status: DC

## 2020-02-29 NOTE — Telephone Encounter (Signed)
Spoke with patient's son regarding his MyChart message. I apologized that he was not permitted up with his father yesterday and advised that the doctor and the nurse were not aware that he was here. He states he was going to call back to complain but talking to me has helped. I advised that I will forward his concern to management and he thanked me.   Patient's son is in agreement that patient start Eliquis. I advised that I would like to have one of our PharmDs call him to discuss dosing and schedule. Please call patient's son, Ricky [name], Ricky [name]. At [phone].  He requests Rx go to [place]. He thanked me for the call.

## 2020-02-29 NOTE — Addendum Note (Signed)
Addended by: Nasiya Pascual E on: 02/29/2020 02:24 PM   Modules accepted: Orders

## 2020-02-29 NOTE — Telephone Encounter (Addendum)
Called pt's son. Pt and son are in agreement with pt changing to Eliquis. Discussed dosing, efficacy, side effects, cost etc. INR was checked yesterday and was 1. Pt will stop warfarin and start Eliquis 5mg  BID with first dose this evening (age 86, weight 84kg, SCr 1 on 02/09/20). Called in 30 day free card as well. 1 month supply is $24 which is affordable for pt, he prefers 90 day supply rx at a time. Canceled new Coumadin appt that was scheduled for next week. Will check repeat labs at appt with Dr Lovena Le in 1 month.

## 2020-02-29 NOTE — Telephone Encounter (Signed)
New Message  Pts son is returning call to [facility] Call transferred to [facility]

## 2020-02-29 NOTE — Progress Notes (Signed)
Patient Care Team: Patient, No Pcp Per as PCP - General (General Practice) Nahser, Wonda Cheng, MD as PCP - Cardiology (Cardiology)  DIAGNOSIS:    ICD-10-CM   1. 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 with above-mentioned history of anemia currently receiving Aranesp and for which he received 2 units of PRBCs on 02/09/20 and 02/12/20. He presents to the clinic today for follow-up.  He tells me that the blood transfusion did not make any difference.  He continues to feel moderately fatigued.  He has not had any spells of dizziness.  ALLERGIES:  has No Known Allergies.  MEDICATIONS:  Current Outpatient Medications  Medication Sig Dispense Refill  . apixaban (ELIQUIS) 5 MG TABS tablet Take 1 tablet (5 mg total) by mouth 2 (two) times daily. 180 tablet 1  . cholecalciferol (VITAMIN D3) 25 MCG (1000 UNIT) tablet Take 1,000 Units by mouth daily.    . cyanocobalamin 1000 MCG tablet Take 1,000 mcg by mouth daily.    . Magnesium 250 MG TABS Take 1 tablet (250 mg total) by mouth daily.  0  . simvastatin (ZOCOR) 20 MG tablet Take 20 mg by mouth daily.     No current facility-administered medications for this visit.    PHYSICAL EXAMINATION: ECOG PERFORMANCE STATUS: 1 - Symptomatic but completely ambulatory  Vitals:   03/01/20 0900  BP: 121/61  Pulse: 77  Resp: 19  Temp: 98.5 F (36.9 C)  SpO2: 100%   Filed Weights   03/01/20 0900  Weight: 187 lb 9.6 oz (85.1 kg)    LABORATORY DATA:  I have reviewed the data as listed CMP Latest Ref Rng & Units 02/09/2020 12/27/2019 12/26/2019  Glucose 70 - 99 mg/dL 105(H) 95 106(H)  BUN 8 - 23 mg/dL 18 23 21   Creatinine 0.61 - 1.24 mg/dL 1.00 0.91 1.01  Sodium 135 - 145 mmol/L 140 140 135  Potassium 3.5 - 5.1 mmol/L 4.7 3.9 4.3  Chloride 98 - 111 mmol/L 106 106 106  CO2 22 - 32 mmol/L 27 25 25   Calcium 8.9 - 10.3 mg/dL 8.5(L) 8.7(L)  8.5(L)  Total Protein 6.5 - 8.1 g/dL 7.2 6.7 7.3  Total Bilirubin 0.3 - 1.2 mg/dL 0.8 1.4(H) 1.1  Alkaline Phos 38 - 126 U/L 57 42 47  AST 15 - 41 U/L 12(L) 15 15  ALT 0 - 44 U/L 11 14 16     Lab Results  Component Value Date   WBC 4.0 03/01/2020   HGB 8.6 (L) 03/01/2020   HCT 26.5 (L) 03/01/2020   MCV 98.9 03/01/2020   PLT 144 (L) 03/01/2020   NEUTROABS 1.8 03/01/2020    ASSESSMENT & PLAN:  Low grade myelodysplastic syndrome lesions (HCC) Normocytic to macrocytic anemia: Hemoglobin 8.6 when he left the hospital after 2 units of PRBC 01/29/2020: Hemoglobin 7.8, MCV 103.4, platelets 149 Gastroenterology evaluation: Did not feel like he is a candidate for endoscopies or biopsies. Blood transfusion 02/21/2020  Treatment plan: Retacrit weekly Lab 03/01/2020: Hemoglobin 8.6 Iron overload: High iron saturation and high ferritin: Genetic testing was negative for hemochromatosis I would like to repeat his iron studies with next lab draw.  Return to clinic weekly for Retacrit injections and in 4 weeks to follow-up with me.  No orders of the defined types were placed in this encounter.  The patient has a good understanding of the overall plan. he agrees  with it. he will call with any problems that may develop before the next visit here.  Total time spent: 20 mins including face to face time and time spent for planning, charting and coordination of care  Nicholas Lose, MD 03/01/2020  I, Cloyde Reams Dorshimer, am acting as scribe for Dr. Nicholas Lose.  I have reviewed the above documentation for accuracy and completeness, and I agree with the above.

## 2020-03-01 ENCOUNTER — Inpatient Hospital Stay: Payer: Medicare Other | Attending: Hematology and Oncology

## 2020-03-01 ENCOUNTER — Other Ambulatory Visit: Payer: Self-pay

## 2020-03-01 ENCOUNTER — Inpatient Hospital Stay: Payer: Medicare Other

## 2020-03-01 ENCOUNTER — Inpatient Hospital Stay: Payer: Medicare Other | Admitting: Hematology and Oncology

## 2020-03-01 DIAGNOSIS — D4620 Refractory anemia with excess of blasts, unspecified: Secondary | ICD-10-CM

## 2020-03-01 DIAGNOSIS — D638 Anemia in other chronic diseases classified elsewhere: Secondary | ICD-10-CM

## 2020-03-01 LAB — CBC WITH DIFFERENTIAL (CANCER CENTER ONLY)
Abs Immature Granulocytes: 0.01 10*3/uL (ref 0.00–0.07)
Basophils Absolute: 0 10*3/uL (ref 0.0–0.1)
Basophils Relative: 1 %
Eosinophils Absolute: 0.2 10*3/uL (ref 0.0–0.5)
Eosinophils Relative: 4 %
HCT: 26.5 % — ABNORMAL LOW (ref 39.0–52.0)
Hemoglobin: 8.6 g/dL — ABNORMAL LOW (ref 13.0–17.0)
Immature Granulocytes: 0 %
Lymphocytes Relative: 29 %
Lymphs Abs: 1.2 10*3/uL (ref 0.7–4.0)
MCH: 32.1 pg (ref 26.0–34.0)
MCHC: 32.5 g/dL (ref 30.0–36.0)
MCV: 98.9 fL (ref 80.0–100.0)
Monocytes Absolute: 0.8 10*3/uL (ref 0.1–1.0)
Monocytes Relative: 21 %
Neutro Abs: 1.8 10*3/uL (ref 1.7–7.7)
Neutrophils Relative %: 45 %
Platelet Count: 144 10*3/uL — ABNORMAL LOW (ref 150–400)
RBC: 2.68 MIL/uL — ABNORMAL LOW (ref 4.22–5.81)
RDW: 21.3 % — ABNORMAL HIGH (ref 11.5–15.5)
WBC Count: 4 10*3/uL (ref 4.0–10.5)
nRBC: 0 % (ref 0.0–0.2)

## 2020-03-01 LAB — CMP (CANCER CENTER ONLY)
ALT: 13 U/L (ref 0–44)
AST: 13 U/L — ABNORMAL LOW (ref 15–41)
Albumin: 3.9 g/dL (ref 3.5–5.0)
Alkaline Phosphatase: 60 U/L (ref 38–126)
Anion gap: 9 (ref 5–15)
BUN: 21 mg/dL (ref 8–23)
CO2: 25 mmol/L (ref 22–32)
Calcium: 8.8 mg/dL — ABNORMAL LOW (ref 8.9–10.3)
Chloride: 107 mmol/L (ref 98–111)
Creatinine: 1.01 mg/dL (ref 0.61–1.24)
GFR, Est AFR Am: >60 mL/min (ref >60)
GFR, Estimated: >60 mL/min (ref >60)
Glucose, Bld: 89 mg/dL (ref 70–99)
Potassium: 4.8 mmol/L (ref 3.5–5.1)
Sodium: 141 mmol/L (ref 135–145)
Total Bilirubin: 1 mg/dL (ref 0.3–1.2)
Total Protein: 7.5 g/dL (ref 6.5–8.1)

## 2020-03-01 LAB — SAMPLE TO BLOOD BANK

## 2020-03-01 MED ORDER — DARBEPOETIN ALFA 300 MCG/0.6ML IJ SOSY
PREFILLED_SYRINGE | INTRAMUSCULAR | Status: AC
  Filled 2020-03-01: qty 0.6

## 2020-03-01 MED ORDER — DARBEPOETIN ALFA 300 MCG/0.6ML IJ SOSY
300.0000 ug | PREFILLED_SYRINGE | Freq: Once | INTRAMUSCULAR | Status: AC
  Administered 2020-03-01: 300 ug via SUBCUTANEOUS

## 2020-03-01 NOTE — Patient Instructions (Signed)
Darbepoetin Alfa injection What is this medicine? DARBEPOETIN ALFA (dar be POE e tin AL fa) helps your body make more red blood cells. It is used to treat anemia caused by chronic kidney failure and chemotherapy. This medicine may be used for other purposes; ask your health care provider or pharmacist if you have questions. COMMON BRAND NAME(S): Aranesp What should I tell my health care provider before I take this medicine? They need to know if you have any of these conditions:  blood clotting disorders or history of blood clots  cancer patient not on chemotherapy  cystic fibrosis  heart disease, such as angina, heart failure, or a history of a heart attack  hemoglobin level of 12 g/dL or greater  high blood pressure  low levels of folate, iron, or vitamin B12  seizures  an unusual or allergic reaction to darbepoetin, erythropoietin, albumin, hamster proteins, latex, other medicines, foods, dyes, or preservatives  pregnant or trying to get pregnant  breast-feeding How should I use this medicine? This medicine is for injection into a vein or under the skin. It is usually given by a health care professional in a hospital or clinic setting. If you get this medicine at home, you will be taught how to prepare and give this medicine. Use exactly as directed. Take your medicine at regular intervals. Do not take your medicine more often than directed. It is important that you put your used needles and syringes in a special sharps container. Do not put them in a trash can. If you do not have a sharps container, call your pharmacist or healthcare provider to get one. A special MedGuide will be given to you by the pharmacist with each prescription and refill. Be sure to read this information carefully each time. Talk to your pediatrician regarding the use of this medicine in children. While this medicine may be used in children as young as 1 month of age for selected conditions, precautions do  apply. Overdosage: If you think you have taken too much of this medicine contact a poison control center or emergency room at once. NOTE: This medicine is only for you. Do not share this medicine with others. What if I miss a dose? If you miss a dose, take it as soon as you can. If it is almost time for your next dose, take only that dose. Do not take double or extra doses. What may interact with this medicine? Do not take this medicine with any of the following medications:  epoetin alfa This list may not describe all possible interactions. Give your health care provider a list of all the medicines, herbs, non-prescription drugs, or dietary supplements you use. Also tell them if you smoke, drink alcohol, or use illegal drugs. Some items may interact with your medicine. What should I watch for while using this medicine? Your condition will be monitored carefully while you are receiving this medicine. You may need blood work done while you are taking this medicine. This medicine may cause a decrease in vitamin B6. You should make sure that you get enough vitamin B6 while you are taking this medicine. Discuss the foods you eat and the vitamins you take with your health care professional. What side effects may I notice from receiving this medicine? Side effects that you should report to your doctor or health care professional as soon as possible:  allergic reactions like skin rash, itching or hives, swelling of the face, lips, or tongue  breathing problems  changes in   vision  chest pain  confusion, trouble speaking or understanding  feeling faint or lightheaded, falls  high blood pressure  muscle aches or pains  pain, swelling, warmth in the leg  rapid weight gain  severe headaches  sudden numbness or weakness of the face, arm or leg  trouble walking, dizziness, loss of balance or coordination  seizures (convulsions)  swelling of the ankles, feet, hands  unusually weak or  tired Side effects that usually do not require medical attention (report to your doctor or health care professional if they continue or are bothersome):  diarrhea  fever, chills (flu-like symptoms)  headaches  nausea, vomiting  redness, stinging, or swelling at site where injected This list may not describe all possible side effects. Call your doctor for medical advice about side effects. You may report side effects to FDA at 1-800-FDA-1088. Where should I keep my medicine? Keep out of the reach of children. Store in a refrigerator between 2 and 8 degrees C (36 and 46 degrees F). Do not freeze. Do not shake. Throw away any unused portion if using a single-dose vial. Throw away any unused medicine after the expiration date. NOTE: This sheet is a summary. It may not cover all possible information. If you have questions about this medicine, talk to your doctor, pharmacist, or health care provider.  2020 Elsevier/Gold Standard (2017-09-22 16:44:20)  

## 2020-03-01 NOTE — Assessment & Plan Note (Signed)
Normocytic to macrocytic anemia: Hemoglobin 8.6 when he left the hospital after 2 units of PRBC 01/29/2020: Hemoglobin 7.8, MCV 103.4, platelets 149 Gastroenterology evaluation: Did not feel like he is a candidate for endoscopies or biopsies. Blood transfusion 02/21/2020  Treatment plan: Aranesp 300 mcg every 3 weeks started 02/09/2020 Lab 03/01/2020: Iron overload: High iron saturation and high ferritin: Genetic testing was negative for hemochromatosis

## 2020-03-07 ENCOUNTER — Other Ambulatory Visit: Payer: Self-pay | Admitting: *Deleted

## 2020-03-07 DIAGNOSIS — D638 Anemia in other chronic diseases classified elsewhere: Secondary | ICD-10-CM

## 2020-03-08 ENCOUNTER — Inpatient Hospital Stay: Payer: Medicare Other

## 2020-03-08 ENCOUNTER — Other Ambulatory Visit: Payer: Self-pay

## 2020-03-08 VITALS — BP 138/57 | HR 72 | Temp 98.7°F | Resp 18

## 2020-03-08 DIAGNOSIS — D638 Anemia in other chronic diseases classified elsewhere: Secondary | ICD-10-CM

## 2020-03-08 DIAGNOSIS — D4620 Refractory anemia with excess of blasts, unspecified: Secondary | ICD-10-CM

## 2020-03-08 LAB — CBC WITH DIFFERENTIAL (CANCER CENTER ONLY)
Abs Immature Granulocytes: 0.02 10*3/uL (ref 0.00–0.07)
Basophils Absolute: 0 10*3/uL (ref 0.0–0.1)
Basophils Relative: 1 %
Eosinophils Absolute: 0.2 10*3/uL (ref 0.0–0.5)
Eosinophils Relative: 3 %
HCT: 24.7 % — ABNORMAL LOW (ref 39.0–52.0)
Hemoglobin: 8 g/dL — ABNORMAL LOW (ref 13.0–17.0)
Immature Granulocytes: 0 %
Lymphocytes Relative: 23 %
Lymphs Abs: 1.2 10*3/uL (ref 0.7–4.0)
MCH: 32.4 pg (ref 26.0–34.0)
MCHC: 32.4 g/dL (ref 30.0–36.0)
MCV: 100 fL (ref 80.0–100.0)
Monocytes Absolute: 0.9 10*3/uL (ref 0.1–1.0)
Monocytes Relative: 18 %
Neutro Abs: 2.9 10*3/uL (ref 1.7–7.7)
Neutrophils Relative %: 55 %
Platelet Count: 164 10*3/uL (ref 150–400)
RBC: 2.47 MIL/uL — ABNORMAL LOW (ref 4.22–5.81)
RDW: 22.5 % — ABNORMAL HIGH (ref 11.5–15.5)
WBC Count: 5.3 10*3/uL (ref 4.0–10.5)
nRBC: 0.4 % — ABNORMAL HIGH (ref 0.0–0.2)

## 2020-03-08 LAB — IRON AND TIBC
Iron: 178 ug/dL — ABNORMAL HIGH (ref 42–163)
Saturation Ratios: 98 % — ABNORMAL HIGH (ref 20–55)
TIBC: 183 ug/dL — ABNORMAL LOW (ref 202–409)
UIBC: 5 ug/dL — ABNORMAL LOW (ref 117–376)

## 2020-03-08 LAB — CMP (CANCER CENTER ONLY)
ALT: 14 U/L (ref 0–44)
AST: 11 U/L — ABNORMAL LOW (ref 15–41)
Albumin: 3.8 g/dL (ref 3.5–5.0)
Alkaline Phosphatase: 63 U/L (ref 38–126)
Anion gap: 4 — ABNORMAL LOW (ref 5–15)
BUN: 19 mg/dL (ref 8–23)
CO2: 26 mmol/L (ref 22–32)
Calcium: 8.8 mg/dL — ABNORMAL LOW (ref 8.9–10.3)
Chloride: 108 mmol/L (ref 98–111)
Creatinine: 0.99 mg/dL (ref 0.61–1.24)
GFR, Est AFR Am: >60 mL/min (ref >60)
GFR, Estimated: >60 mL/min (ref >60)
Glucose, Bld: 97 mg/dL (ref 70–99)
Potassium: 5.1 mmol/L (ref 3.5–5.1)
Sodium: 138 mmol/L (ref 135–145)
Total Bilirubin: 0.8 mg/dL (ref 0.3–1.2)
Total Protein: 7.3 g/dL (ref 6.5–8.1)

## 2020-03-08 LAB — FERRITIN: Ferritin: 1239 ng/mL — ABNORMAL HIGH (ref 24–336)

## 2020-03-08 LAB — SAMPLE TO BLOOD BANK

## 2020-03-08 MED ORDER — DARBEPOETIN ALFA 300 MCG/0.6ML IJ SOSY
PREFILLED_SYRINGE | INTRAMUSCULAR | Status: AC
  Filled 2020-03-08: qty 0.6

## 2020-03-08 MED ORDER — DARBEPOETIN ALFA 300 MCG/0.6ML IJ SOSY
300.0000 ug | PREFILLED_SYRINGE | Freq: Once | INTRAMUSCULAR | Status: AC
  Administered 2020-03-08: 300 ug via SUBCUTANEOUS

## 2020-03-08 NOTE — Patient Instructions (Signed)
Darbepoetin Alfa injection What is this medicine? DARBEPOETIN ALFA (dar be POE e tin AL fa) helps your body make more red blood cells. It is used to treat anemia caused by chronic kidney failure and chemotherapy. This medicine may be used for other purposes; ask your health care provider or pharmacist if you have questions. COMMON BRAND NAME(S): Aranesp What should I tell my health care provider before I take this medicine? They need to know if you have any of these conditions:  blood clotting disorders or history of blood clots  cancer patient not on chemotherapy  cystic fibrosis  heart disease, such as angina, heart failure, or a history of a heart attack  hemoglobin level of 12 g/dL or greater  high blood pressure  low levels of folate, iron, or vitamin B12  seizures  an unusual or allergic reaction to darbepoetin, erythropoietin, albumin, hamster proteins, latex, other medicines, foods, dyes, or preservatives  pregnant or trying to get pregnant  breast-feeding How should I use this medicine? This medicine is for injection into a vein or under the skin. It is usually given by a health care professional in a hospital or clinic setting. If you get this medicine at home, you will be taught how to prepare and give this medicine. Use exactly as directed. Take your medicine at regular intervals. Do not take your medicine more often than directed. It is important that you put your used needles and syringes in a special sharps container. Do not put them in a trash can. If you do not have a sharps container, call your pharmacist or healthcare provider to get one. A special MedGuide will be given to you by the pharmacist with each prescription and refill. Be sure to read this information carefully each time. Talk to your pediatrician regarding the use of this medicine in children. While this medicine may be used in children as young as 1 month of age for selected conditions, precautions do  apply. Overdosage: If you think you have taken too much of this medicine contact a poison control center or emergency room at once. NOTE: This medicine is only for you. Do not share this medicine with others. What if I miss a dose? If you miss a dose, take it as soon as you can. If it is almost time for your next dose, take only that dose. Do not take double or extra doses. What may interact with this medicine? Do not take this medicine with any of the following medications:  epoetin alfa This list may not describe all possible interactions. Give your health care provider a list of all the medicines, herbs, non-prescription drugs, or dietary supplements you use. Also tell them if you smoke, drink alcohol, or use illegal drugs. Some items may interact with your medicine. What should I watch for while using this medicine? Your condition will be monitored carefully while you are receiving this medicine. You may need blood work done while you are taking this medicine. This medicine may cause a decrease in vitamin B6. You should make sure that you get enough vitamin B6 while you are taking this medicine. Discuss the foods you eat and the vitamins you take with your health care professional. What side effects may I notice from receiving this medicine? Side effects that you should report to your doctor or health care professional as soon as possible:  allergic reactions like skin rash, itching or hives, swelling of the face, lips, or tongue  breathing problems  changes in   vision  chest pain  confusion, trouble speaking or understanding  feeling faint or lightheaded, falls  high blood pressure  muscle aches or pains  pain, swelling, warmth in the leg  rapid weight gain  severe headaches  sudden numbness or weakness of the face, arm or leg  trouble walking, dizziness, loss of balance or coordination  seizures (convulsions)  swelling of the ankles, feet, hands  unusually weak or  tired Side effects that usually do not require medical attention (report to your doctor or health care professional if they continue or are bothersome):  diarrhea  fever, chills (flu-like symptoms)  headaches  nausea, vomiting  redness, stinging, or swelling at site where injected This list may not describe all possible side effects. Call your doctor for medical advice about side effects. You may report side effects to FDA at 1-800-FDA-1088. Where should I keep my medicine? Keep out of the reach of children. Store in a refrigerator between 2 and 8 degrees C (36 and 46 degrees F). Do not freeze. Do not shake. Throw away any unused portion if using a single-dose vial. Throw away any unused medicine after the expiration date. NOTE: This sheet is a summary. It may not cover all possible information. If you have questions about this medicine, talk to your doctor, pharmacist, or health care provider.  2020 Elsevier/Gold Standard (2017-09-22 16:44:20)  

## 2020-03-15 ENCOUNTER — Other Ambulatory Visit: Payer: Self-pay | Admitting: *Deleted

## 2020-03-15 ENCOUNTER — Inpatient Hospital Stay: Payer: Medicare Other

## 2020-03-15 ENCOUNTER — Other Ambulatory Visit: Payer: Self-pay

## 2020-03-15 VITALS — BP 145/68 | HR 78 | Temp 98.7°F | Resp 18

## 2020-03-15 DIAGNOSIS — D638 Anemia in other chronic diseases classified elsewhere: Secondary | ICD-10-CM

## 2020-03-15 DIAGNOSIS — D4620 Refractory anemia with excess of blasts, unspecified: Secondary | ICD-10-CM | POA: Diagnosis not present

## 2020-03-15 LAB — CBC WITH DIFFERENTIAL (CANCER CENTER ONLY)
Abs Immature Granulocytes: 0.02 10*3/uL (ref 0.00–0.07)
Basophils Absolute: 0 10*3/uL (ref 0.0–0.1)
Basophils Relative: 1 %
Eosinophils Absolute: 0.2 10*3/uL (ref 0.0–0.5)
Eosinophils Relative: 4 %
HCT: 24.1 % — ABNORMAL LOW (ref 39.0–52.0)
Hemoglobin: 7.7 g/dL — ABNORMAL LOW (ref 13.0–17.0)
Immature Granulocytes: 1 %
Lymphocytes Relative: 30 %
Lymphs Abs: 1.1 10*3/uL (ref 0.7–4.0)
MCH: 31.7 pg (ref 26.0–34.0)
MCHC: 32 g/dL (ref 30.0–36.0)
MCV: 99.2 fL (ref 80.0–100.0)
Monocytes Absolute: 0.9 10*3/uL (ref 0.1–1.0)
Monocytes Relative: 22 %
Neutro Abs: 1.6 10*3/uL — ABNORMAL LOW (ref 1.7–7.7)
Neutrophils Relative %: 42 %
Platelet Count: 137 10*3/uL — ABNORMAL LOW (ref 150–400)
RBC: 2.43 MIL/uL — ABNORMAL LOW (ref 4.22–5.81)
RDW: 23.7 % — ABNORMAL HIGH (ref 11.5–15.5)
WBC Count: 3.8 10*3/uL — ABNORMAL LOW (ref 4.0–10.5)
nRBC: 0.5 % — ABNORMAL HIGH (ref 0.0–0.2)

## 2020-03-15 LAB — SAMPLE TO BLOOD BANK

## 2020-03-15 MED ORDER — DARBEPOETIN ALFA 300 MCG/0.6ML IJ SOSY
PREFILLED_SYRINGE | INTRAMUSCULAR | Status: AC
  Filled 2020-03-15: qty 0.6

## 2020-03-15 MED ORDER — DARBEPOETIN ALFA 300 MCG/0.6ML IJ SOSY
300.0000 ug | PREFILLED_SYRINGE | Freq: Once | INTRAMUSCULAR | Status: AC
  Administered 2020-03-15: 300 ug via SUBCUTANEOUS

## 2020-03-21 ENCOUNTER — Telehealth: Payer: Self-pay | Admitting: Hematology and Oncology

## 2020-03-21 ENCOUNTER — Other Ambulatory Visit: Payer: Self-pay | Admitting: *Deleted

## 2020-03-21 DIAGNOSIS — D638 Anemia in other chronic diseases classified elsewhere: Secondary | ICD-10-CM

## 2020-03-21 NOTE — Telephone Encounter (Signed)
Scheduled per 06/11 los, patient has been called and notified.

## 2020-03-22 ENCOUNTER — Inpatient Hospital Stay: Payer: Medicare Other

## 2020-03-22 ENCOUNTER — Other Ambulatory Visit: Payer: Self-pay | Admitting: *Deleted

## 2020-03-22 ENCOUNTER — Other Ambulatory Visit: Payer: Self-pay | Admitting: Adult Health

## 2020-03-22 ENCOUNTER — Inpatient Hospital Stay: Payer: Medicare Other | Attending: Hematology and Oncology

## 2020-03-22 ENCOUNTER — Other Ambulatory Visit: Payer: Self-pay

## 2020-03-22 VITALS — BP 130/52 | HR 79 | Temp 98.0°F | Resp 18

## 2020-03-22 DIAGNOSIS — D638 Anemia in other chronic diseases classified elsewhere: Secondary | ICD-10-CM

## 2020-03-22 DIAGNOSIS — D4620 Refractory anemia with excess of blasts, unspecified: Secondary | ICD-10-CM | POA: Insufficient documentation

## 2020-03-22 DIAGNOSIS — D649 Anemia, unspecified: Secondary | ICD-10-CM

## 2020-03-22 LAB — CBC WITH DIFFERENTIAL (CANCER CENTER ONLY)
Abs Immature Granulocytes: 0.02 10*3/uL (ref 0.00–0.07)
Basophils Absolute: 0 10*3/uL (ref 0.0–0.1)
Basophils Relative: 1 %
Eosinophils Absolute: 0.2 10*3/uL (ref 0.0–0.5)
Eosinophils Relative: 5 %
HCT: 21.1 % — ABNORMAL LOW (ref 39.0–52.0)
Hemoglobin: 6.9 g/dL — CL (ref 13.0–17.0)
Immature Granulocytes: 1 %
Lymphocytes Relative: 28 %
Lymphs Abs: 0.9 10*3/uL (ref 0.7–4.0)
MCH: 32.7 pg (ref 26.0–34.0)
MCHC: 32.7 g/dL (ref 30.0–36.0)
MCV: 100 fL (ref 80.0–100.0)
Monocytes Absolute: 0.8 10*3/uL (ref 0.1–1.0)
Monocytes Relative: 23 %
Neutro Abs: 1.5 10*3/uL — ABNORMAL LOW (ref 1.7–7.7)
Neutrophils Relative %: 42 %
Platelet Count: 168 10*3/uL (ref 150–400)
RBC: 2.11 MIL/uL — ABNORMAL LOW (ref 4.22–5.81)
RDW: 24.8 % — ABNORMAL HIGH (ref 11.5–15.5)
WBC Count: 3.4 10*3/uL — ABNORMAL LOW (ref 4.0–10.5)
nRBC: 0 % (ref 0.0–0.2)

## 2020-03-22 LAB — CMP (CANCER CENTER ONLY)
ALT: 17 U/L (ref 0–44)
AST: 13 U/L — ABNORMAL LOW (ref 15–41)
Albumin: 3.8 g/dL (ref 3.5–5.0)
Alkaline Phosphatase: 52 U/L (ref 38–126)
Anion gap: 8 (ref 5–15)
BUN: 23 mg/dL (ref 8–23)
CO2: 24 mmol/L (ref 22–32)
Calcium: 8.5 mg/dL — ABNORMAL LOW (ref 8.9–10.3)
Chloride: 106 mmol/L (ref 98–111)
Creatinine: 1.07 mg/dL (ref 0.61–1.24)
GFR, Est AFR Am: >60 mL/min (ref >60)
GFR, Estimated: >60 mL/min (ref >60)
Glucose, Bld: 120 mg/dL — ABNORMAL HIGH (ref 70–99)
Potassium: 4.2 mmol/L (ref 3.5–5.1)
Sodium: 138 mmol/L (ref 135–145)
Total Bilirubin: 1 mg/dL (ref 0.3–1.2)
Total Protein: 7.4 g/dL (ref 6.5–8.1)

## 2020-03-22 LAB — IRON AND TIBC
Iron: 185 ug/dL — ABNORMAL HIGH (ref 42–163)
Saturation Ratios: 100 % — ABNORMAL HIGH (ref 20–55)
TIBC: 185 ug/dL — ABNORMAL LOW (ref 202–409)
UIBC: 0 ug/dL — ABNORMAL LOW (ref 117–376)

## 2020-03-22 LAB — SAMPLE TO BLOOD BANK

## 2020-03-22 LAB — FERRITIN: Ferritin: 1107 ng/mL — ABNORMAL HIGH (ref 24–336)

## 2020-03-22 LAB — PREPARE RBC (CROSSMATCH)

## 2020-03-22 MED ORDER — DARBEPOETIN ALFA 500 MCG/ML IJ SOSY
500.0000 ug | PREFILLED_SYRINGE | Freq: Once | INTRAMUSCULAR | Status: AC
  Administered 2020-03-22: 500 ug via SUBCUTANEOUS

## 2020-03-22 MED ORDER — DARBEPOETIN ALFA 500 MCG/ML IJ SOSY
PREFILLED_SYRINGE | INTRAMUSCULAR | Status: AC
  Filled 2020-03-22: qty 1

## 2020-03-23 ENCOUNTER — Inpatient Hospital Stay: Payer: Medicare Other

## 2020-03-23 ENCOUNTER — Other Ambulatory Visit: Payer: Self-pay

## 2020-03-23 DIAGNOSIS — D649 Anemia, unspecified: Secondary | ICD-10-CM

## 2020-03-23 DIAGNOSIS — D4620 Refractory anemia with excess of blasts, unspecified: Secondary | ICD-10-CM | POA: Diagnosis not present

## 2020-03-23 MED ORDER — DIPHENHYDRAMINE HCL 25 MG PO CAPS
ORAL_CAPSULE | ORAL | Status: AC
  Filled 2020-03-23: qty 1

## 2020-03-23 MED ORDER — ACETAMINOPHEN 325 MG PO TABS
650.0000 mg | ORAL_TABLET | Freq: Once | ORAL | Status: AC
  Administered 2020-03-23: 650 mg via ORAL

## 2020-03-23 MED ORDER — SODIUM CHLORIDE 0.9% IV SOLUTION
250.0000 mL | Freq: Once | INTRAVENOUS | Status: AC
  Administered 2020-03-23: 250 mL via INTRAVENOUS
  Filled 2020-03-23: qty 250

## 2020-03-23 MED ORDER — DIPHENHYDRAMINE HCL 25 MG PO CAPS
25.0000 mg | ORAL_CAPSULE | Freq: Once | ORAL | Status: AC
  Administered 2020-03-23: 25 mg via ORAL

## 2020-03-23 MED ORDER — ACETAMINOPHEN 325 MG PO TABS
ORAL_TABLET | ORAL | Status: AC
  Filled 2020-03-23: qty 2

## 2020-03-23 NOTE — Patient Instructions (Signed)

## 2020-03-24 LAB — TYPE AND SCREEN
ABO/RH(D): B POS
Antibody Screen: POSITIVE
DAT, IgG: POSITIVE
Donor AG Type: NEGATIVE
Donor AG Type: NEGATIVE
Unit division: 0
Unit division: 0

## 2020-03-24 LAB — BPAM RBC
Blood Product Expiration Date: 202107262359
Blood Product Expiration Date: 202107282359
ISSUE DATE / TIME: 202107030924
ISSUE DATE / TIME: 202107030924
Unit Type and Rh: 7300
Unit Type and Rh: 7300

## 2020-03-27 NOTE — Progress Notes (Signed)
Patient Care Team: Patient, No Pcp Per as PCP - General (General Practice) Nahser, Wonda Cheng, MD as PCP - Cardiology (Cardiology)  DIAGNOSIS:    ICD-10-CM   1. 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 with above-mentioned history of anemia currently receiving Aranesp and for which he received 2 units of PRBCs on 03/23/20. He presents to the clinic today for follow-up.  He reports that he did not feel any different before and after the blood transfusion.  He denies any dizziness or lightheadedness.  He is able to walk with the help of walker.   ALLERGIES:  has No Known Allergies.  MEDICATIONS:  Current Outpatient Medications  Medication Sig Dispense Refill  . apixaban (ELIQUIS) 5 MG TABS tablet Take 1 tablet (5 mg total) by mouth 2 (two) times daily. 180 tablet 1  . cholecalciferol (VITAMIN D3) 25 MCG (1000 UNIT) tablet Take 1,000 Units by mouth daily.    . cyanocobalamin 1000 MCG tablet Take 1,000 mcg by mouth daily.    . Magnesium 250 MG TABS Take 1 tablet (250 mg total) by mouth daily.  0  . simvastatin (ZOCOR) 20 MG tablet Take 20 mg by mouth daily.     No current facility-administered medications for this visit.    PHYSICAL EXAMINATION: ECOG PERFORMANCE STATUS: 2 - Symptomatic, <50% confined to bed  Vitals:   03/28/20 0942  BP: 127/64  Pulse: 76  Resp: 17  Temp: 99.1 F (37.3 C)  SpO2: 98%   Filed Weights   03/28/20 0942  Weight: 192 lb 11.2 oz (87.4 kg)    LABORATORY DATA:  I have reviewed the data as listed CMP Latest Ref Rng & Units 03/22/2020 03/08/2020 03/01/2020  Glucose 70 - 99 mg/dL 120(H) 97 89  BUN 8 - 23 mg/dL 23 19 21   Creatinine 0.61 - 1.24 mg/dL 1.07 0.99 1.01  Sodium 135 - 145 mmol/L 138 138 141  Potassium 3.5 - 5.1 mmol/L 4.2 5.1 4.8  Chloride 98 - 111 mmol/L 106 108 107  CO2 22 - 32 mmol/L 24 26 25   Calcium 8.9 - 10.3 mg/dL 8.5(L) 8.8(L)  8.8(L)  Total Protein 6.5 - 8.1 g/dL 7.4 7.3 7.5  Total Bilirubin 0.3 - 1.2 mg/dL 1.0 0.8 1.0  Alkaline Phos 38 - 126 U/L 52 63 60  AST 15 - 41 U/L 13(L) 11(L) 13(L)  ALT 0 - 44 U/L 17 14 13     Lab Results  Component Value Date   WBC 4.5 03/28/2020   HGB 9.0 (L) 03/28/2020   HCT 28.0 (L) 03/28/2020   MCV 96.2 03/28/2020   PLT 158 03/28/2020   NEUTROABS 2.5 03/28/2020    ASSESSMENT & PLAN:  Low grade myelodysplastic syndrome lesions (HCC) Normocytic to macrocytic anemia: Hemoglobin 8.6 when he left the hospital after 2 units of PRBC 01/29/2020: Hemoglobin 7.8, MCV 103.4, platelets 149 Gastroenterology evaluation: Did not feel like he is a candidate for endoscopies or biopsies. Blood transfusion 02/21/2020, 03/23/20  Treatment plan:  Aranesp every 3 weeks 500 mcg (increased from 300 mcg) Lab review: 03/28/2020: Hemoglobin is 9 (it was 6.9 on 03/22/2020) Aranesp dosage increased to 500 mcg and 03/22/2020  Return to clinic in 2 weeks with labs injection and MD visit and follow-up    No orders of the defined types were placed in this encounter.  The patient has a good understanding of the overall  plan. he agrees with it. he will call with any problems that may develop before the next visit here.  Total time spent: 20 mins including face to face time and time spent for planning, charting and coordination of care  Nicholas Lose, MD 03/28/2020  I, Cloyde Reams Dorshimer, am acting as scribe for Dr. Nicholas Lose.  I have reviewed the above documentation for accuracy and completeness, and I agree with the above.      Okay.  She is off [facility] now bringing her next week to recheck

## 2020-03-28 ENCOUNTER — Inpatient Hospital Stay: Payer: Medicare Other

## 2020-03-28 ENCOUNTER — Inpatient Hospital Stay: Payer: Medicare Other | Admitting: Hematology and Oncology

## 2020-03-28 ENCOUNTER — Telehealth: Payer: Self-pay | Admitting: Hematology and Oncology

## 2020-03-28 ENCOUNTER — Other Ambulatory Visit: Payer: Self-pay | Admitting: *Deleted

## 2020-03-28 ENCOUNTER — Ambulatory Visit: Payer: Medicare Other | Admitting: Internal Medicine

## 2020-03-28 ENCOUNTER — Other Ambulatory Visit: Payer: Self-pay

## 2020-03-28 ENCOUNTER — Encounter: Payer: Self-pay | Admitting: Internal Medicine

## 2020-03-28 DIAGNOSIS — D4620 Refractory anemia with excess of blasts, unspecified: Secondary | ICD-10-CM | POA: Diagnosis not present

## 2020-03-28 DIAGNOSIS — I495 Sick sinus syndrome: Secondary | ICD-10-CM

## 2020-03-28 DIAGNOSIS — D649 Anemia, unspecified: Secondary | ICD-10-CM

## 2020-03-28 DIAGNOSIS — Z950 Presence of cardiac pacemaker: Secondary | ICD-10-CM

## 2020-03-28 LAB — CUP PACEART INCLINIC DEVICE CHECK
Battery Remaining Longevity: 139 mo
Battery Voltage: 3.01 V
Brady Statistic RA Percent Paced: 1.6 %
Brady Statistic RV Percent Paced: 2.3 %
Date Time Interrogation Session: 20210708122336
Implantable Lead Implant Date: 20090807
Implantable Lead Implant Date: 20090807
Implantable Lead Location: 753859
Implantable Lead Location: 753860
Implantable Pulse Generator Implant Date: 20170719
Lead Channel Impedance Value: 325 Ohm
Lead Channel Impedance Value: 375 Ohm
Lead Channel Pacing Threshold Amplitude: 0.5 V
Lead Channel Pacing Threshold Amplitude: 0.5 V
Lead Channel Pacing Threshold Amplitude: 1.25 V
Lead Channel Pacing Threshold Amplitude: 1.25 V
Lead Channel Pacing Threshold Pulse Width: 0.5 ms
Lead Channel Pacing Threshold Pulse Width: 0.5 ms
Lead Channel Pacing Threshold Pulse Width: 1 ms
Lead Channel Pacing Threshold Pulse Width: 1 ms
Lead Channel Sensing Intrinsic Amplitude: 3.3 mV
Lead Channel Sensing Intrinsic Amplitude: 5.2 mV
Lead Channel Setting Pacing Amplitude: 2.25 V
Lead Channel Setting Pacing Amplitude: 2.5 V
Lead Channel Setting Pacing Pulse Width: 1 ms
Lead Channel Setting Sensing Sensitivity: 2 mV
Pulse Gen Model: 2272
Pulse Gen Serial Number: 7927766

## 2020-03-28 LAB — IRON AND TIBC
Iron: 196 ug/dL — ABNORMAL HIGH (ref 42–163)
Saturation Ratios: 104 % — ABNORMAL HIGH (ref 20–55)
TIBC: 189 ug/dL — ABNORMAL LOW (ref 202–409)
UIBC: UNDETERMINED ug/dL (ref 117–376)

## 2020-03-28 LAB — CMP (CANCER CENTER ONLY)
ALT: 18 U/L (ref 0–44)
AST: 13 U/L — ABNORMAL LOW (ref 15–41)
Albumin: 3.9 g/dL (ref 3.5–5.0)
Alkaline Phosphatase: 58 U/L (ref 38–126)
Anion gap: 8 (ref 5–15)
BUN: 19 mg/dL (ref 8–23)
CO2: 25 mmol/L (ref 22–32)
Calcium: 8.9 mg/dL (ref 8.9–10.3)
Chloride: 105 mmol/L (ref 98–111)
Creatinine: 1.09 mg/dL (ref 0.61–1.24)
GFR, Est AFR Am: >60 mL/min (ref >60)
GFR, Estimated: >60 mL/min (ref >60)
Glucose, Bld: 99 mg/dL (ref 70–99)
Potassium: 4.7 mmol/L (ref 3.5–5.1)
Sodium: 138 mmol/L (ref 135–145)
Total Bilirubin: 1.1 mg/dL (ref 0.3–1.2)
Total Protein: 7.5 g/dL (ref 6.5–8.1)

## 2020-03-28 LAB — CBC WITH DIFFERENTIAL (CANCER CENTER ONLY)
Abs Immature Granulocytes: 0.01 10*3/uL (ref 0.00–0.07)
Basophils Absolute: 0 10*3/uL (ref 0.0–0.1)
Basophils Relative: 1 %
Eosinophils Absolute: 0.1 10*3/uL (ref 0.0–0.5)
Eosinophils Relative: 3 %
HCT: 28 % — ABNORMAL LOW (ref 39.0–52.0)
Hemoglobin: 9 g/dL — ABNORMAL LOW (ref 13.0–17.0)
Immature Granulocytes: 0 %
Lymphocytes Relative: 20 %
Lymphs Abs: 0.9 10*3/uL (ref 0.7–4.0)
MCH: 30.9 pg (ref 26.0–34.0)
MCHC: 32.1 g/dL (ref 30.0–36.0)
MCV: 96.2 fL (ref 80.0–100.0)
Monocytes Absolute: 0.9 10*3/uL (ref 0.1–1.0)
Monocytes Relative: 20 %
Neutro Abs: 2.5 10*3/uL (ref 1.7–7.7)
Neutrophils Relative %: 56 %
Platelet Count: 158 10*3/uL (ref 150–400)
RBC: 2.91 MIL/uL — ABNORMAL LOW (ref 4.22–5.81)
RDW: 22 % — ABNORMAL HIGH (ref 11.5–15.5)
WBC Count: 4.5 10*3/uL (ref 4.0–10.5)
nRBC: 0 % (ref 0.0–0.2)

## 2020-03-28 LAB — SAMPLE TO BLOOD BANK

## 2020-03-28 LAB — FERRITIN: Ferritin: 1358 ng/mL — ABNORMAL HIGH (ref 24–336)

## 2020-03-28 NOTE — Telephone Encounter (Signed)
Scheduled appts per 7/8 los. Gave pt a print out of AVS.  °

## 2020-03-28 NOTE — Progress Notes (Signed)
HPI Ricky [name] is referred today by Dr. Acie Fredrickson for ongoing evaluation and management of atrial fib and sinus node dysfunction, s/p PPM insertion. He has moved from [place] to [place] to be closer to his children. His son and daughter in law live here. He denies chest pain or sob. He has no palpitations. He underwent his initial PPM insertion in 2009. He had a gen change in 2017. He denies syncope.  No Known Allergies   Current Outpatient Medications  Medication Sig Dispense Refill  . apixaban (ELIQUIS) 5 MG TABS tablet Take 1 tablet (5 mg total) by mouth 2 (two) times daily. 180 tablet 1  . cholecalciferol (VITAMIN D3) 25 MCG (1000 UNIT) tablet Take 1,000 Units by mouth daily.    . cyanocobalamin 1000 MCG tablet Take 1,000 mcg by mouth daily.    . Magnesium 250 MG TABS Take 1 tablet (250 mg total) by mouth daily.  0  . simvastatin (ZOCOR) 20 MG tablet Take 20 mg by mouth daily.     No current facility-administered medications for this visit.     Past Medical History:  Diagnosis Date  . A-fib ([place])   . Anemia   . Anemia of chronic disease 02/02/2020  . Atrial fibrillation ([place])   . Boerhaave's syndrome   . Chronic anticoagulation 01/22/2020  . Colon polyps   . COVID-19   . Hx of colonic polyps 01/22/2020   2017 - [place]  . Hypomagnesemia   . Low grade myelodysplastic syndrome lesions ([place]) 02/02/2020  . Lower urinary tract symptoms due to benign prostatic hyperplasia   . Pacemaker   . Paroxysmal atrial fibrillation (HCC)   . Prostate enlargement   . Symptomatic anemia 12/26/2019  . Vitamin D deficiency     ROS:   All systems reviewed and negative except as noted in the HPI.   Past Surgical History:  Procedure Laterality Date  . esophogeal tear repair    . PACEMAKER INSERTION  2009, 2017  . THORACOTOMY     esophageal perforation  . TOTAL HIP ARTHROPLASTY Right   . TOTAL KNEE ARTHROPLASTY Left      Family History  Problem Relation Age of Onset  . Cirrhosis Mother   .  Alcoholism Mother   . Heart attack Father 72  . Heart disease Brother   . Diabetes Brother   . Brain cancer Daughter      Social History   Socioeconomic History  . Marital status: Married    Spouse name: Not on file  . Number of children: 2  . Years of education: Not on file  . Highest education level: Not on file  Occupational History  . Occupation: retired  Tobacco Use  . Smoking status: Former Smoker    Types: Cigarettes    Quit date: 01/21/1970    Years since quitting: 50.2  . Smokeless tobacco: Never Used  Vaping Use  . Vaping Use: Never used  Substance and Sexual Activity  . Alcohol use: Not Currently  . Drug use: Never  . Sexual activity: Yes  Other Topics Concern  . Not on file  Social History Narrative  . Not on file   Social Determinants of Health   Financial Resource Strain:   . Difficulty of Paying Living Expenses:   Food Insecurity:   . Worried About Charity fundraiser in the Last Year:   . [place] in the Last Year:   Transportation Needs:   . Lack of  Transportation (Medical):   Marland Kitchen Lack of Transportation (Non-Medical):   Physical Activity:   . Days of Exercise per Week:   . Minutes of Exercise per Session:   Stress:   . Feeling of Stress :   Social Connections:   . Frequency of Communication with Friends and Family:   . Frequency of Social Gatherings with Friends and Family:   . Attends Religious Services:   . Active Member of Clubs or Organizations:   . Attends Archivist Meetings:   Marland Kitchen Marital Status:   Intimate Partner Violence:   . Fear of Current or Ex-Partner:   . Emotionally Abused:   Marland Kitchen Physically Abused:   . Sexually Abused:      BP 124/72   Pulse 71   Ht 5\' 11"  (1.803 m)   Wt 192 lb 12.8 oz (87.5 kg)   SpO2 97%   BMI 26.89 kg/m   Physical Exam:  Well appearing NAD HEENT: Unremarkable Neck:  No JVD, no thyromegally Lymphatics:  No adenopathy Back:  No CVA tenderness Lungs:  Clear with no  wheezes HEART:  Regular rate rhythm, no murmurs, no rubs, no clicks Abd:  soft, positive bowel sounds, no organomegally, no rebound, no guarding Ext:  2 plus pulses, no edema, no cyanosis, no clubbing Skin:  No rashes no nodules Neuro:  CN II through XII intact, motor grossly intact  DEVICE  Normal device function.  See PaceArt for details.   Assess/Plan: 1. PAF - he is asymptomatic and is maintaining NSR. No probems with eliquis.  2. HTN -his bp is controlled. No change in meds. 3. Dyslipidemia - he is doing well on his statin therapy. 4. Anemia - he denies associated symptoms. He had a hgb of 7 2 months ago.   Ricky [name] Cyree Chuong,MD

## 2020-03-28 NOTE — Patient Instructions (Signed)
Medication Instructions:  Your physician recommends that you continue on your current medications as directed. Please refer to the Current Medication list given to you today.  Labwork: None ordered.  Testing/Procedures: None ordered.  Follow-Up: Your physician wants you to follow-up in: one year with Dr. Lovena Le.   You will receive a reminder letter in the mail two months in advance. If you don't receive a letter, please call our office to schedule the follow-up appointment.  Remote monitoring is used to monitor your Pacemaker from home. This monitoring reduces the number of office visits required to check your device to one time per year. It allows [place] to keep an eye on the functioning of your device to ensure it is working properly. You are scheduled for a device check from home on 06/27/2020. You may send your transmission at any time that day. If you have a wireless device, the transmission will be sent automatically. After your physician reviews your transmission, you will receive a postcard with your next transmission date.  Any Other Special Instructions Will Be Listed Below (If Applicable).  If you need a refill on your cardiac medications before your next appointment, please call your pharmacy.

## 2020-03-28 NOTE — Assessment & Plan Note (Signed)
Normocytic to macrocytic anemia: Hemoglobin 8.6 when he left the hospital after 2 units of PRBC 01/29/2020: Hemoglobin 7.8, MCV 103.4, platelets 149 Gastroenterology evaluation: Did not feel like he is a candidate for endoscopies or biopsies. Blood transfusion 02/21/2020, 03/23/20  Treatment plan: Retacrit weekly Lab review: 03/28/2020: Hemoglobin is 9 (it was 6.9 on 03/22/2020) We will increase the dosage of Retacrit  Continue with every 2-week blood work and follow-up in 1 month.

## 2020-03-29 ENCOUNTER — Ambulatory Visit: Payer: Medicare Other

## 2020-03-29 ENCOUNTER — Other Ambulatory Visit: Payer: Medicare Other

## 2020-03-29 ENCOUNTER — Ambulatory Visit: Payer: Medicare Other | Admitting: Hematology and Oncology

## 2020-04-03 ENCOUNTER — Ambulatory Visit: Payer: Medicare Other | Admitting: Adult Health

## 2020-04-05 ENCOUNTER — Ambulatory Visit: Payer: Medicare Other

## 2020-04-05 ENCOUNTER — Other Ambulatory Visit: Payer: Medicare Other

## 2020-04-11 NOTE — Progress Notes (Signed)
Patient Care Team: Patient, No Pcp Per as PCP - General (General Practice) Nahser, Wonda Cheng, MD as PCP - Cardiology (Cardiology)  DIAGNOSIS:    ICD-10-CM   1. 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 with above-mentioned history of anemiacurrently receiving Aranesp and for which he received 2 units of PRBCs on 03/23/20. He presents to the clinic todayfor follow-up.  He is able to get around with the help of a walker.  He is not complaining of any dizziness or lightheadedness or shortness of breath to exertion.  ALLERGIES:  has No Known Allergies.  MEDICATIONS:  Current Outpatient Medications  Medication Sig Dispense Refill  . apixaban (ELIQUIS) 5 MG TABS tablet Take 1 tablet (5 mg total) by mouth 2 (two) times daily. 180 tablet 1  . cholecalciferol (VITAMIN D3) 25 MCG (1000 UNIT) tablet Take 1,000 Units by mouth daily.    . cyanocobalamin 1000 MCG tablet Take 1,000 mcg by mouth daily.    . Magnesium 250 MG TABS Take 1 tablet (250 mg total) by mouth daily.  0  . simvastatin (ZOCOR) 20 MG tablet Take 20 mg by mouth daily.     No current facility-administered medications for this visit.    PHYSICAL EXAMINATION: ECOG PERFORMANCE STATUS: 1 - Symptomatic but completely ambulatory  Vitals:   04/12/20 0922  BP: (!) 127/59  Pulse: 89  Resp: 17  Temp: 99.1 F (37.3 C)  SpO2: 99%   Filed Weights   04/12/20 0922  Weight: 194 lb 6.4 oz (88.2 kg)    LABORATORY DATA:  I have reviewed the data as listed CMP Latest Ref Rng & Units 03/28/2020 03/22/2020 03/08/2020  Glucose 70 - 99 mg/dL 99 120(H) 97  BUN 8 - 23 mg/dL 19 23 19   Creatinine 0.61 - 1.24 mg/dL 1.09 1.07 0.99  Sodium 135 - 145 mmol/L 138 138 138  Potassium 3.5 - 5.1 mmol/L 4.7 4.2 5.1  Chloride 98 - 111 mmol/L 105 106 108  CO2 22 - 32 mmol/L 25 24 26   Calcium 8.9 - 10.3 mg/dL 8.9 8.5(L) 8.8(L)  Total Protein 6.5  - 8.1 g/dL 7.5 7.4 7.3  Total Bilirubin 0.3 - 1.2 mg/dL 1.1 1.0 0.8  Alkaline Phos 38 - 126 U/L 58 52 63  AST 15 - 41 U/L 13(L) 13(L) 11(L)  ALT 0 - 44 U/L 18 17 14     Lab Results  Component Value Date   WBC 4.6 04/12/2020   HGB 7.7 (L) 04/12/2020   HCT 23.6 (L) 04/12/2020   MCV 96.7 04/12/2020   PLT 183 04/12/2020   NEUTROABS 2.5 04/12/2020    ASSESSMENT & PLAN:  Low grade myelodysplastic syndrome lesions (HCC) Normocytic to macrocytic anemia:  Gastroenterology evaluation: Did not feel like he is a candidate for endoscopies or biopsies. Blood transfusion 02/21/2020, 03/23/20  Treatment plan: Aranesp every 3 weeks 500 mcg (increased from 300 mcg)  Lab review:  01/29/2020: Hemoglobin 7.8, MCV 103.4, platelets 149 03/28/2020: Hemoglobin is 9 (it was 6.9 on 03/22/2020) 04/12/2020: Hemoglobin is 7.7 We will hold off on blood transfusion today because he is generally at his baseline and is not very symptomatic. We will give him Aranesp injection. I instructed him to call us if he starts to feel poorly so that we can give him blood at a later time.  Return to clinic in 3 weeks with labs, injection and follow-up  No orders of the defined types were placed in this encounter.  The patient has a good understanding of the overall plan. he agrees with it. he will call with any problems that may develop before the next visit here.  Total time spent: 20 mins including face to face time and time spent for planning, charting and coordination of care  Owens Shark, NP 04/12/2020  I, Cloyde Reams Dorshimer, am acting as scribe for Dr. Nicholas Lose.  I have reviewed the above documentation for accuracy and completeness, and I agree with the above.

## 2020-04-12 ENCOUNTER — Other Ambulatory Visit: Payer: Medicare Other

## 2020-04-12 ENCOUNTER — Inpatient Hospital Stay: Payer: Medicare Other

## 2020-04-12 ENCOUNTER — Other Ambulatory Visit: Payer: Self-pay

## 2020-04-12 ENCOUNTER — Ambulatory Visit: Payer: Medicare Other | Admitting: Nurse Practitioner

## 2020-04-12 ENCOUNTER — Inpatient Hospital Stay: Payer: Medicare Other | Admitting: Hematology and Oncology

## 2020-04-12 DIAGNOSIS — D4620 Refractory anemia with excess of blasts, unspecified: Secondary | ICD-10-CM | POA: Diagnosis not present

## 2020-04-12 DIAGNOSIS — D638 Anemia in other chronic diseases classified elsewhere: Secondary | ICD-10-CM

## 2020-04-12 LAB — CBC WITH DIFFERENTIAL (CANCER CENTER ONLY)
Abs Immature Granulocytes: 0.01 10*3/uL (ref 0.00–0.07)
Basophils Absolute: 0 10*3/uL (ref 0.0–0.1)
Basophils Relative: 0 %
Eosinophils Absolute: 0.2 10*3/uL (ref 0.0–0.5)
Eosinophils Relative: 4 %
HCT: 23.6 % — ABNORMAL LOW (ref 39.0–52.0)
Hemoglobin: 7.7 g/dL — ABNORMAL LOW (ref 13.0–17.0)
Immature Granulocytes: 0 %
Lymphocytes Relative: 23 %
Lymphs Abs: 1.1 10*3/uL (ref 0.7–4.0)
MCH: 31.6 pg (ref 26.0–34.0)
MCHC: 32.6 g/dL (ref 30.0–36.0)
MCV: 96.7 fL (ref 80.0–100.0)
Monocytes Absolute: 0.9 10*3/uL (ref 0.1–1.0)
Monocytes Relative: 19 %
Neutro Abs: 2.5 10*3/uL (ref 1.7–7.7)
Neutrophils Relative %: 54 %
Platelet Count: 183 10*3/uL (ref 150–400)
RBC: 2.44 MIL/uL — ABNORMAL LOW (ref 4.22–5.81)
RDW: 23.6 % — ABNORMAL HIGH (ref 11.5–15.5)
WBC Count: 4.6 10*3/uL (ref 4.0–10.5)
nRBC: 0 % (ref 0.0–0.2)

## 2020-04-12 LAB — RETICULOCYTES
Immature Retic Fract: 23.3 % — ABNORMAL HIGH (ref 2.3–15.9)
RBC.: 2.34 MIL/uL — ABNORMAL LOW (ref 4.22–5.81)
Retic Count, Absolute: 36.7 10*3/uL (ref 19.0–186.0)
Retic Ct Pct: 1.6 % (ref 0.4–3.1)

## 2020-04-12 LAB — SAMPLE TO BLOOD BANK

## 2020-04-12 MED ORDER — DARBEPOETIN ALFA 500 MCG/ML IJ SOSY
PREFILLED_SYRINGE | INTRAMUSCULAR | Status: AC
  Filled 2020-04-12: qty 1

## 2020-04-12 MED ORDER — DARBEPOETIN ALFA 500 MCG/ML IJ SOSY
500.0000 ug | PREFILLED_SYRINGE | Freq: Once | INTRAMUSCULAR | Status: AC
  Administered 2020-04-12: 500 ug via SUBCUTANEOUS

## 2020-04-12 NOTE — Patient Instructions (Signed)
Darbepoetin Alfa injection What is this medicine? DARBEPOETIN ALFA (dar be POE e tin AL fa) helps your body make more red blood cells. It is used to treat anemia caused by chronic kidney failure and chemotherapy. This medicine may be used for other purposes; ask your health care provider or pharmacist if you have questions. COMMON BRAND NAME(S): Aranesp What should I tell my health care provider before I take this medicine? They need to know if you have any of these conditions:  blood clotting disorders or history of blood clots  cancer patient not on chemotherapy  cystic fibrosis  heart disease, such as angina, heart failure, or a history of a heart attack  hemoglobin level of 12 g/dL or greater  high blood pressure  low levels of folate, iron, or vitamin B12  seizures  an unusual or allergic reaction to darbepoetin, erythropoietin, albumin, hamster proteins, latex, other medicines, foods, dyes, or preservatives  pregnant or trying to get pregnant  breast-feeding How should I use this medicine? This medicine is for injection into a vein or under the skin. It is usually given by a health care professional in a hospital or clinic setting. If you get this medicine at home, you will be taught how to prepare and give this medicine. Use exactly as directed. Take your medicine at regular intervals. Do not take your medicine more often than directed. It is important that you put your used needles and syringes in a special sharps container. Do not put them in a trash can. If you do not have a sharps container, call your pharmacist or healthcare provider to get one. A special MedGuide will be given to you by the pharmacist with each prescription and refill. Be sure to read this information carefully each time. Talk to your pediatrician regarding the use of this medicine in children. While this medicine may be used in children as young as 1 month of age for selected conditions, precautions do  apply. Overdosage: If you think you have taken too much of this medicine contact a poison control center or emergency room at once. NOTE: This medicine is only for you. Do not share this medicine with others. What if I miss a dose? If you miss a dose, take it as soon as you can. If it is almost time for your next dose, take only that dose. Do not take double or extra doses. What may interact with this medicine? Do not take this medicine with any of the following medications:  epoetin alfa This list may not describe all possible interactions. Give your health care provider a list of all the medicines, herbs, non-prescription drugs, or dietary supplements you use. Also tell them if you smoke, drink alcohol, or use illegal drugs. Some items may interact with your medicine. What should I watch for while using this medicine? Your condition will be monitored carefully while you are receiving this medicine. You may need blood work done while you are taking this medicine. This medicine may cause a decrease in vitamin B6. You should make sure that you get enough vitamin B6 while you are taking this medicine. Discuss the foods you eat and the vitamins you take with your health care professional. What side effects may I notice from receiving this medicine? Side effects that you should report to your doctor or health care professional as soon as possible:  allergic reactions like skin rash, itching or hives, swelling of the face, lips, or tongue  breathing problems  changes in   vision  chest pain  confusion, trouble speaking or understanding  feeling faint or lightheaded, falls  high blood pressure  muscle aches or pains  pain, swelling, warmth in the leg  rapid weight gain  severe headaches  sudden numbness or weakness of the face, arm or leg  trouble walking, dizziness, loss of balance or coordination  seizures (convulsions)  swelling of the ankles, feet, hands  unusually weak or  tired Side effects that usually do not require medical attention (report to your doctor or health care professional if they continue or are bothersome):  diarrhea  fever, chills (flu-like symptoms)  headaches  nausea, vomiting  redness, stinging, or swelling at site where injected This list may not describe all possible side effects. Call your doctor for medical advice about side effects. You may report side effects to FDA at 1-800-FDA-1088. Where should I keep my medicine? Keep out of the reach of children. Store in a refrigerator between 2 and 8 degrees C (36 and 46 degrees F). Do not freeze. Do not shake. Throw away any unused portion if using a single-dose vial. Throw away any unused medicine after the expiration date. NOTE: This sheet is a summary. It may not cover all possible information. If you have questions about this medicine, talk to your doctor, pharmacist, or health care provider.  2020 Elsevier/Gold Standard (2017-09-22 16:44:20)  

## 2020-04-12 NOTE — Assessment & Plan Note (Signed)
Normocytic to macrocytic anemia:  Gastroenterology evaluation: Did not feel like he is a candidate for endoscopies or biopsies. Blood transfusion 02/21/2020, 03/23/20  Treatment plan: Aranesp every 3 weeks 500 mcg (increased from 300 mcg)  Lab review:  01/29/2020: Hemoglobin 7.8, MCV 103.4, platelets 149 03/28/2020: Hemoglobin is 9 (it was 6.9 on 03/22/2020) 04/12/2020: Hemoglobin is  Return to clinic in 3 weeks with labs, injection and follow-up

## 2020-05-03 ENCOUNTER — Telehealth: Payer: Self-pay | Admitting: Hematology and Oncology

## 2020-05-03 ENCOUNTER — Inpatient Hospital Stay: Payer: Medicare Other | Admitting: Hematology and Oncology

## 2020-05-03 ENCOUNTER — Other Ambulatory Visit: Payer: Self-pay

## 2020-05-03 ENCOUNTER — Inpatient Hospital Stay: Payer: Medicare Other | Attending: Hematology and Oncology

## 2020-05-03 ENCOUNTER — Other Ambulatory Visit: Payer: Self-pay | Admitting: *Deleted

## 2020-05-03 ENCOUNTER — Inpatient Hospital Stay: Payer: Medicare Other

## 2020-05-03 DIAGNOSIS — D4620 Refractory anemia with excess of blasts, unspecified: Secondary | ICD-10-CM | POA: Diagnosis present

## 2020-05-03 DIAGNOSIS — D649 Anemia, unspecified: Secondary | ICD-10-CM

## 2020-05-03 DIAGNOSIS — D638 Anemia in other chronic diseases classified elsewhere: Secondary | ICD-10-CM

## 2020-05-03 LAB — CBC WITH DIFFERENTIAL (CANCER CENTER ONLY)
Abs Immature Granulocytes: 0.01 10*3/uL (ref 0.00–0.07)
Basophils Absolute: 0 10*3/uL (ref 0.0–0.1)
Basophils Relative: 1 %
Eosinophils Absolute: 0.1 10*3/uL (ref 0.0–0.5)
Eosinophils Relative: 4 %
HCT: 20.1 % — ABNORMAL LOW (ref 39.0–52.0)
Hemoglobin: 6.5 g/dL — CL (ref 13.0–17.0)
Immature Granulocytes: 0 %
Lymphocytes Relative: 25 %
Lymphs Abs: 1 10*3/uL (ref 0.7–4.0)
MCH: 32 pg (ref 26.0–34.0)
MCHC: 32.3 g/dL (ref 30.0–36.0)
MCV: 99 fL (ref 80.0–100.0)
Monocytes Absolute: 0.9 10*3/uL (ref 0.1–1.0)
Monocytes Relative: 22 %
Neutro Abs: 2 10*3/uL (ref 1.7–7.7)
Neutrophils Relative %: 48 %
Platelet Count: 178 10*3/uL (ref 150–400)
RBC: 2.03 MIL/uL — ABNORMAL LOW (ref 4.22–5.81)
RDW: 26.5 % — ABNORMAL HIGH (ref 11.5–15.5)
WBC Count: 4.1 10*3/uL (ref 4.0–10.5)
nRBC: 0 % (ref 0.0–0.2)

## 2020-05-03 LAB — SAMPLE TO BLOOD BANK

## 2020-05-03 LAB — PREPARE RBC (CROSSMATCH)

## 2020-05-03 MED ORDER — ACETAMINOPHEN 325 MG PO TABS
ORAL_TABLET | ORAL | Status: AC
  Filled 2020-05-03: qty 2

## 2020-05-03 MED ORDER — ACETAMINOPHEN 325 MG PO TABS
650.0000 mg | ORAL_TABLET | Freq: Once | ORAL | Status: AC
  Administered 2020-05-03: 650 mg via ORAL

## 2020-05-03 MED ORDER — DIPHENHYDRAMINE HCL 25 MG PO CAPS
ORAL_CAPSULE | ORAL | Status: AC
  Filled 2020-05-03: qty 1

## 2020-05-03 MED ORDER — DIPHENHYDRAMINE HCL 25 MG PO CAPS
25.0000 mg | ORAL_CAPSULE | Freq: Once | ORAL | Status: AC
  Administered 2020-05-03: 25 mg via ORAL

## 2020-05-03 MED ORDER — DARBEPOETIN ALFA 500 MCG/ML IJ SOSY
PREFILLED_SYRINGE | INTRAMUSCULAR | Status: AC
  Filled 2020-05-03: qty 1

## 2020-05-03 MED ORDER — DARBEPOETIN ALFA 500 MCG/ML IJ SOSY
500.0000 ug | PREFILLED_SYRINGE | Freq: Once | INTRAMUSCULAR | Status: AC
  Administered 2020-05-03: 500 ug via SUBCUTANEOUS

## 2020-05-03 MED ORDER — SODIUM CHLORIDE 0.9% IV SOLUTION
250.0000 mL | Freq: Once | INTRAVENOUS | Status: AC
  Administered 2020-05-03: 250 mL via INTRAVENOUS
  Filled 2020-05-03: qty 250

## 2020-05-03 NOTE — Progress Notes (Signed)
CRITICAL VALUE ALERT  Critical Value:  Hgb 6.5  Date & Time Notied:  05/03/20 at [place]  Provider Notified: Nicholas Lose, MD  Orders Received/Actions taken: Per MD pt to receive 2 units of PRBC's today.  Orders placed.

## 2020-05-03 NOTE — Telephone Encounter (Signed)
Scheduled appointment per 8/13 scheduling message. Spoke with patient's son who is aware of appointment date and time.

## 2020-05-03 NOTE — Assessment & Plan Note (Signed)
Normocytic to macrocytic anemia:  Gastroenterology evaluation: Did not feel like he is a candidate for endoscopies or biopsies. Blood transfusion 02/21/2020, 03/23/20  Treatment plan:Aranesp every 3 weeks 500 mcg (increased from 300 mcg)  Lab review:  01/29/2020: Hemoglobin 7.8, MCV 103.4, platelets 149 03/28/2020: Hemoglobin is 9 (it was 6.9 on 03/22/2020) 04/12/2020: Hemoglobin is 7.7  We will hold off on blood transfusion today because he is generally at his baseline and is not very symptomatic. We will give him Aranesp injection.  Return to clinic in 3 weeks for labs injection and follow-up

## 2020-05-03 NOTE — Patient Instructions (Signed)
Blood Transfusion, Adult, Care After This sheet gives you information about how to care for yourself after your procedure. Your doctor may also give you more specific instructions. If you have problems or questions, contact your doctor. What can I expect after the procedure? After the procedure, it is common to have:  Bruising and soreness at the IV site.  A fever or chills on the day of the procedure. This may be your body's response to the new blood cells received.  A headache. Follow these instructions at home: Insertion site care      Follow instructions from your doctor about how to take care of your insertion site. This is where an IV tube was put into your vein. Make sure you: ? Wash your hands with soap and water before and after you change your bandage (dressing). If you cannot use soap and water, use hand sanitizer. ? Change your bandage as told by your doctor.  Check your insertion site every day for signs of infection. Check for: ? Redness, swelling, or pain. ? Bleeding from the site. ? Warmth. ? Pus or a bad smell. General instructions  Take over-the-counter and prescription medicines only as told by your doctor.  Rest as told by your doctor.  Go back to your normal activities as told by your doctor.  Keep all follow-up visits as told by your doctor. This is important. Contact a doctor if:  You have itching or red, swollen areas of skin (hives).  You feel worried or nervous (anxious).  You feel weak after doing your normal activities.  You have redness, swelling, warmth, or pain around the insertion site.  You have blood coming from the insertion site, and the blood does not stop with pressure.  You have pus or a bad smell coming from the insertion site. Get help right away if:  You have signs of a serious reaction. This may be coming from an allergy or the body's defense system (immune system). Signs include: ? Trouble breathing or shortness of  breath. ? Swelling of the face or feeling warm (flushed). ? Fever or chills. ? Head, chest, or back pain. ? Dark pee (urine) or blood in the pee. ? Widespread rash. ? Fast heartbeat. ? Feeling dizzy or light-headed. You may receive your blood transfusion in an outpatient setting. If so, you will be told whom to contact to report any reactions. These symptoms may be an emergency. Do not wait to see if the symptoms will go away. Get medical help right away. Call your local emergency services (911 in the [place] Do not drive yourself to the hospital. Summary  Bruising and soreness at the IV site are common.  Check your insertion site every day for signs of infection.  Rest as told by your doctor. Go back to your normal activities as told by your doctor.  Get help right away if you have signs of a serious reaction. This information is not intended to replace advice given to you by your health care provider. Make sure you discuss any questions you have with your health care provider. Document Revised: 03/02/2019 Document Reviewed: 03/02/2019 Elsevier Patient Education  2020 Else[employer]. Darbepoetin Alfa injection What is this medicine? DARBEPOETIN ALFA (dar be POE e tin AL fa) helps your body make more red blood cells. It is used to treat anemia caused by chronic kidney failure and chemotherapy. This medicine may be used for other purposes; ask your health care provider or pharmacist if you have questions.   COMMON BRAND NAME(S): Aranesp What should I tell my health care provider before I take this medicine? They need to know if you have any of these conditions:  blood clotting disorders or history of blood clots  cancer patient not on chemotherapy  cystic fibrosis  heart disease, such as angina, heart failure, or a history of a heart attack  hemoglobin level of 12 g/dL or greater  high blood pressure  low levels of folate, iron, or vitamin B12  seizures  an unusual or  allergic reaction to darbepoetin, erythropoietin, albumin, hamster proteins, latex, other medicines, foods, dyes, or preservatives  pregnant or trying to get pregnant  breast-feeding How should I use this medicine? This medicine is for injection into a vein or under the skin. It is usually given by a health care professional in a hospital or clinic setting. If you get this medicine at home, you will be taught how to prepare and give this medicine. Use exactly as directed. Take your medicine at regular intervals. Do not take your medicine more often than directed. It is important that you put your used needles and syringes in a special sharps container. Do not put them in a trash can. If you do not have a sharps container, call your pharmacist or healthcare provider to get one. A special MedGuide will be given to you by the pharmacist with each prescription and refill. Be sure to read this information carefully each time. Talk to your pediatrician regarding the use of this medicine in children. While this medicine may be used in children as young as 1 month of age for selected conditions, precautions do apply. Overdosage: If you think you have taken too much of this medicine contact a poison control center or emergency room at once. NOTE: This medicine is only for you. Do not share this medicine with others. What if I miss a dose? If you miss a dose, take it as soon as you can. If it is almost time for your next dose, take only that dose. Do not take double or extra doses. What may interact with this medicine? Do not take this medicine with any of the following medications:  epoetin alfa This list may not describe all possible interactions. Give your health care provider a list of all the medicines, herbs, non-prescription drugs, or dietary supplements you use. Also tell them if you smoke, drink alcohol, or use illegal drugs. Some items may interact with your medicine. What should I watch for  while using this medicine? Your condition will be monitored carefully while you are receiving this medicine. You may need blood work done while you are taking this medicine. This medicine may cause a decrease in vitamin B6. You should make sure that you get enough vitamin B6 while you are taking this medicine. Discuss the foods you eat and the vitamins you take with your health care professional. What side effects may I notice from receiving this medicine? Side effects that you should report to your doctor or health care professional as soon as possible:  allergic reactions like skin rash, itching or hives, swelling of the face, lips, or tongue  breathing problems  changes in vision  chest pain  confusion, trouble speaking or understanding  feeling faint or lightheaded, falls  high blood pressure  muscle aches or pains  pain, swelling, warmth in the leg  rapid weight gain  severe headaches  sudden numbness or weakness of the face, arm or leg  trouble walking, dizziness, loss   of balance or coordination  seizures (convulsions)  swelling of the ankles, feet, hands  unusually weak or tired Side effects that usually do not require medical attention (report to your doctor or health care professional if they continue or are bothersome):  diarrhea  fever, chills (flu-like symptoms)  headaches  nausea, vomiting  redness, stinging, or swelling at site where injected This list may not describe all possible side effects. Call your doctor for medical advice about side effects. You may report side effects to FDA at 1-800-FDA-1088. Where should I keep my medicine? Keep out of the reach of children. Store in a refrigerator between 2 and 8 degrees C (36 and 46 degrees F). Do not freeze. Do not shake. Throw away any unused portion if using a single-dose vial. Throw away any unused medicine after the expiration date. NOTE: This sheet is a summary. It may not cover all possible  information. If you have questions about this medicine, talk to your doctor, pharmacist, or health care provider.  2020 Elsevier/Gold Standard (2017-09-22 16:44:20)  

## 2020-05-03 NOTE — Progress Notes (Signed)
Patient Care Team: Patient, No Pcp Per as PCP - General (General Practice) Nahser, Wonda Cheng, MD as PCP - Cardiology (Cardiology)  DIAGNOSIS:    ICD-10-CM   1. Low grade myelodysplastic syndrome lesions ([facility])  D46.20     CHIEF COMPLIANT: Follow-up ofanemia  INTERVAL HISTORY: Ricky [name] is a 86 y.o. with above-mentioned history of anemiacurrently receivingAranesp. He presents to the clinic todayfor follow-up.  His major complaint is difficulty with sleeping because he has to get up for urination multiple times.  He feels tired because of that.  He does not do too much activity and therefore he does not notice any dizziness or lightheadedness.  ALLERGIES:  has No Known Allergies.  MEDICATIONS:  Current Outpatient Medications  Medication Sig Dispense Refill  . apixaban (ELIQUIS) 5 MG TABS tablet Take 1 tablet (5 mg total) by mouth 2 (two) times daily. 180 tablet 1  . cholecalciferol (VITAMIN D3) 25 MCG (1000 UNIT) tablet Take 1,000 Units by mouth daily.    . cyanocobalamin 1000 MCG tablet Take 1,000 mcg by mouth daily.    . Magnesium 250 MG TABS Take 1 tablet (250 mg total) by mouth daily.  0  . simvastatin (ZOCOR) 20 MG tablet Take 20 mg by mouth daily.     No current facility-administered medications for this visit.    PHYSICAL EXAMINATION: ECOG PERFORMANCE STATUS: 1 - Symptomatic but completely ambulatory  Vitals:   05/03/20 0855  BP: (!) 115/58  Pulse: 80  Resp: 17  Temp: (!) 97.5 F (36.4 C)  SpO2: 100%   Filed Weights   05/03/20 0855  Weight: 197 lb 1.6 oz (89.4 kg)    LABORATORY DATA:  I have reviewed the data as listed CMP Latest Ref Rng & Units 03/28/2020 03/22/2020 03/08/2020  Glucose 70 - 99 mg/dL 99 120(H) 97  BUN 8 - 23 mg/dL '19 23 19  '$ Creatinine 0.61 - 1.24 mg/dL 1.09 1.07 0.99  Sodium 135 - 145 mmol/L 138 138 138  Potassium 3.5 - 5.1 mmol/L 4.7 4.2 5.1  Chloride 98 - 111 mmol/L 105 106 108  CO2 22 - 32 mmol/L '25 24 26  '$ Calcium 8.9 - 10.3  mg/dL 8.9 8.5(L) 8.8(L)  Total Protein 6.5 - 8.1 g/dL 7.5 7.4 7.3  Total Bilirubin 0.3 - 1.2 mg/dL 1.1 1.0 0.8  Alkaline Phos 38 - 126 U/L 58 52 63  AST 15 - 41 U/L 13(L) 13(L) 11(L)  ALT 0 - 44 U/L '18 17 14    '$ Lab Results  Component Value Date   WBC 4.1 05/03/2020   HGB 6.5 (LL) 05/03/2020   HCT 20.1 (L) 05/03/2020   MCV 99.0 05/03/2020   PLT 178 05/03/2020   NEUTROABS 2.0 05/03/2020    ASSESSMENT & PLAN:  Low grade myelodysplastic syndrome lesions ([facility]) Normocytic to macrocytic anemia:  Gastroenterology evaluation: Did not feel like he is a candidate for endoscopies or biopsies. Blood transfusion 02/21/2020, 03/23/20  Treatment plan:Aranesp every 3 weeks 500 mcg (increased from 300 mcg)  Lab review:  01/29/2020: Hemoglobin 7.8, MCV 103.4, platelets 149 03/28/2020: Hemoglobin is 9 (it was 6.9 on 03/22/2020) 04/12/2020: Hemoglobin is 7.7 05/03/2020: Hemoglobin 6.5  We will plan to transfuse 2 units of PRBC today. We will also give him Aranesp injection. We will plan to do a bone marrow biopsy on 05/06/2020  Return to clinic in 3 weeks for labs injection and follow-up and we will block time for blood transfusion.    No orders of the defined  types were placed in this encounter.  The patient has a good understanding of the overall plan. he agrees with it. he will call with any problems that may develop before the next visit here.  Total time spent: 30 mins including face to face time and time spent for planning, charting and coordination of care  Nicholas Lose, MD 05/03/2020  I, Cloyde Reams Dorshimer, am acting as scribe for Dr. Nicholas Lose.  I have reviewed the above documentation for accuracy and completeness, and I agree with the above.

## 2020-05-06 ENCOUNTER — Inpatient Hospital Stay (HOSPITAL_BASED_OUTPATIENT_CLINIC_OR_DEPARTMENT_OTHER): Payer: Medicare Other | Admitting: Hematology and Oncology

## 2020-05-06 ENCOUNTER — Other Ambulatory Visit: Payer: Self-pay

## 2020-05-06 ENCOUNTER — Inpatient Hospital Stay: Payer: Medicare Other

## 2020-05-06 ENCOUNTER — Other Ambulatory Visit: Payer: Self-pay | Admitting: Hematology and Oncology

## 2020-05-06 ENCOUNTER — Other Ambulatory Visit: Payer: Medicare Other

## 2020-05-06 VITALS — BP 130/64 | HR 66 | Temp 97.7°F | Resp 16

## 2020-05-06 DIAGNOSIS — D638 Anemia in other chronic diseases classified elsewhere: Secondary | ICD-10-CM

## 2020-05-06 DIAGNOSIS — D4620 Refractory anemia with excess of blasts, unspecified: Secondary | ICD-10-CM | POA: Diagnosis not present

## 2020-05-06 LAB — CBC WITH DIFFERENTIAL (CANCER CENTER ONLY)
Abs Immature Granulocytes: 0.01 10*3/uL (ref 0.00–0.07)
Basophils Absolute: 0 10*3/uL (ref 0.0–0.1)
Basophils Relative: 0 %
Eosinophils Absolute: 0.1 10*3/uL (ref 0.0–0.5)
Eosinophils Relative: 3 %
HCT: 17.3 % — ABNORMAL LOW (ref 39.0–52.0)
Hemoglobin: 5.4 g/dL — CL (ref 13.0–17.0)
Immature Granulocytes: 0 %
Lymphocytes Relative: 22 %
Lymphs Abs: 0.6 10*3/uL — ABNORMAL LOW (ref 0.7–4.0)
MCH: 31.2 pg (ref 26.0–34.0)
MCHC: 31.2 g/dL (ref 30.0–36.0)
MCV: 100 fL (ref 80.0–100.0)
Monocytes Absolute: 0.6 10*3/uL (ref 0.1–1.0)
Monocytes Relative: 22 %
Neutro Abs: 1.4 10*3/uL — ABNORMAL LOW (ref 1.7–7.7)
Neutrophils Relative %: 53 %
Platelet Count: 123 10*3/uL — ABNORMAL LOW (ref 150–400)
RBC: 1.73 MIL/uL — ABNORMAL LOW (ref 4.22–5.81)
RDW: 27.5 % — ABNORMAL HIGH (ref 11.5–15.5)
WBC Count: 2.7 10*3/uL — ABNORMAL LOW (ref 4.0–10.5)
nRBC: 0 % (ref 0.0–0.2)

## 2020-05-06 MED ORDER — ACETAMINOPHEN 325 MG PO TABS
ORAL_TABLET | ORAL | Status: AC
  Filled 2020-05-06: qty 2

## 2020-05-06 MED ORDER — ACETAMINOPHEN 325 MG PO TABS
650.0000 mg | ORAL_TABLET | Freq: Once | ORAL | Status: AC
  Administered 2020-05-06: 650 mg via ORAL

## 2020-05-06 MED ORDER — DIPHENHYDRAMINE HCL 25 MG PO TABS
25.0000 mg | ORAL_TABLET | Freq: Once | ORAL | Status: AC
  Administered 2020-05-06: 25 mg via ORAL
  Filled 2020-05-06: qty 1

## 2020-05-06 MED ORDER — SODIUM CHLORIDE 0.9% IV SOLUTION
250.0000 mL | Freq: Once | INTRAVENOUS | Status: AC
  Administered 2020-05-06: 250 mL via INTRAVENOUS
  Filled 2020-05-06: qty 250

## 2020-05-06 MED ORDER — DIPHENHYDRAMINE HCL 25 MG PO CAPS
ORAL_CAPSULE | ORAL | Status: AC
  Filled 2020-05-06: qty 1

## 2020-05-06 NOTE — Progress Notes (Signed)
0900 Drsg C/D/I

## 2020-05-06 NOTE — Progress Notes (Signed)
INDICATION:  Brief examination was performed. ENT: adequate airway clearance Heart: regular rate and rhythm.No Murmurs Lungs: clear to auscultation, no wheezes, normal respiratory effort  Bone Marrow Biopsy and Aspiration Procedure Note   Informed consent was obtained and potential risks including bleeding, infection and pain were reviewed with the patient.  The patient's name, date of birth, identification, consent and allergies were verified prior to the start of procedure and time out was performed.  The right posterior iliac crest was chosen as the site of biopsy.  The skin was prepped with ChloraPrep.   8 cc of 1% lidocaine was used to provide local anaesthesia.   10 cc of bone marrow aspirate was obtained followed by 1cm biopsy (biopsy material was very soft).  Pressure was applied to the biopsy site and bandage was placed over the biopsy site. Patient was made to lie on the back for 30 mins prior to discharge. He will also be receiving blood transfusion today.  The procedure was tolerated well. COMPLICATIONS: None BLOOD LOSS: none The patient was discharged home in stable condition with a 1-2 weeks follow up to review results.  Patient was provided with post bone marrow biopsy instructions and instructed to call if there was any bleeding or worsening pain.  Specimens sent for flow cytometry, cytogenetics and additional studies.  Signed Harriette Ohara, MD

## 2020-05-06 NOTE — Patient Instructions (Signed)
Blood Transfusion, Adult, Care After This sheet gives you information about how to care for yourself after your procedure. Your doctor may also give you more specific instructions. If you have problems or questions, contact your doctor. What can I expect after the procedure? After the procedure, it is common to have:  Bruising and soreness at the IV site.  A fever or chills on the day of the procedure. This may be your body's response to the new blood cells received.  A headache. Follow these instructions at home: Insertion site care      Follow instructions from your doctor about how to take care of your insertion site. This is where an IV tube was put into your vein. Make sure you: ? Wash your hands with soap and water before and after you change your bandage (dressing). If you cannot use soap and water, use hand sanitizer. ? Change your bandage as told by your doctor.  Check your insertion site every day for signs of infection. Check for: ? Redness, swelling, or pain. ? Bleeding from the site. ? Warmth. ? Pus or a bad smell. General instructions  Take over-the-counter and prescription medicines only as told by your doctor.  Rest as told by your doctor.  Go back to your normal activities as told by your doctor.  Keep all follow-up visits as told by your doctor. This is important. Contact a doctor if:  You have itching or red, swollen areas of skin (hives).  You feel worried or nervous (anxious).  You feel weak after doing your normal activities.  You have redness, swelling, warmth, or pain around the insertion site.  You have blood coming from the insertion site, and the blood does not stop with pressure.  You have pus or a bad smell coming from the insertion site. Get help right away if:  You have signs of a serious reaction. This may be coming from an allergy or the body's defense system (immune system). Signs include: ? Trouble breathing or shortness of  breath. ? Swelling of the face or feeling warm (flushed). ? Fever or chills. ? Head, chest, or back pain. ? Dark pee (urine) or blood in the pee. ? Widespread rash. ? Fast heartbeat. ? Feeling dizzy or light-headed. You may receive your blood transfusion in an outpatient setting. If so, you will be told whom to contact to report any reactions. These symptoms may be an emergency. Do not wait to see if the symptoms will go away. Get medical help right away. Call your local emergency services (911 in the [place] Do not drive yourself to the hospital. Summary  Bruising and soreness at the IV site are common.  Check your insertion site every day for signs of infection.  Rest as told by your doctor. Go back to your normal activities as told by your doctor.  Get help right away if you have signs of a serious reaction. This information is not intended to replace advice given to you by your health care provider. Make sure you discuss any questions you have with your health care provider. Document Revised: 03/02/2019 Document Reviewed: 03/02/2019 Elsevier Patient Education  2020 Elsevier Inc.  Bone Marrow Aspiration and Bone Marrow Biopsy, Adult, Care After This sheet gives you information about how to care for yourself after your procedure. Your health care provider may also give you more specific instructions. If you have problems or questions, contact your health care provider. What can I expect after the procedure? After the  procedure, it is common to have:  Mild pain and tenderness.  Swelling.  Bruising. Follow these instructions at home: Puncture site care   Follow instructions from your health care provider about how to take care of the puncture site. Make sure you: ? Wash your hands with soap and water before and after you change your bandage (dressing). If soap and water are not available, use hand sanitizer. ? Change your dressing as told by your health care provider.  Check  your puncture site every day for signs of infection. Check for: ? More redness, swelling, or pain. ? Fluid or blood. ? Warmth. ? Pus or a bad smell. Activity  Return to your normal activities as told by your health care provider. Ask your health care provider what activities are safe for you.  Do not lift anything that is heavier than 10 lb (4.5 kg), or the limit that you are told, until your health care provider says that it is safe.  Do not drive for 24 hours if you were given a sedative during your procedure. General instructions   Take over-the-counter and prescription medicines only as told by your health care provider.  Do not take baths, swim, or use a hot tub until your health care provider approves. Ask your health care provider if you may take showers. You may only be allowed to take sponge baths.  If directed, put ice on the affected area. To do this: ? Put ice in a plastic bag. ? Place a towel between your skin and the bag. ? Leave the ice on for 20 minutes, 2-3 times a day.  Keep all follow-up visits as told by your health care provider. This is important. Contact a health care provider if:  Your pain is not controlled with medicine.  You have a fever.  You have more redness, swelling, or pain around the puncture site.  You have fluid or blood coming from the puncture site.  Your puncture site feels warm to the touch.  You have pus or a bad smell coming from the puncture site. Summary  After the procedure, it is common to have mild pain, tenderness, swelling, and bruising.  Follow instructions from your health care provider about how to take care of the puncture site and what activities are safe for you.  Take over-the-counter and prescription medicines only as told by your health care provider.  Contact a health care provider if you have any signs of infection, such as fluid or blood coming from the puncture site. This information is not intended to  replace advice given to you by your health care provider. Make sure you discuss any questions you have with your health care provider. Document Revised: 01/24/2019 Document Reviewed: 01/24/2019 Elsevier Patient Education  [place].

## 2020-05-07 ENCOUNTER — Telehealth: Payer: Self-pay | Admitting: Hematology and Oncology

## 2020-05-07 LAB — TYPE AND SCREEN
ABO/RH(D): B POS
Antibody Screen: POSITIVE
DAT, IgG: POSITIVE
Donor AG Type: NEGATIVE
Donor AG Type: NEGATIVE
Unit division: 0
Unit division: 0

## 2020-05-07 LAB — BPAM RBC
Blood Product Expiration Date: 202108252359
Blood Product Expiration Date: 202108282359
ISSUE DATE / TIME: 202108160901
ISSUE DATE / TIME: 202108160901
Unit Type and Rh: 1700
Unit Type and Rh: 7300

## 2020-05-07 NOTE — Telephone Encounter (Signed)
No 8/16 los, no changes made to pt schedule

## 2020-05-08 LAB — SURGICAL PATHOLOGY

## 2020-05-13 ENCOUNTER — Encounter (HOSPITAL_COMMUNITY): Payer: Self-pay | Admitting: Hematology and Oncology

## 2020-05-14 ENCOUNTER — Encounter (HOSPITAL_COMMUNITY): Payer: Self-pay | Admitting: Hematology and Oncology

## 2020-05-16 ENCOUNTER — Telehealth: Payer: Self-pay | Admitting: *Deleted

## 2020-05-16 DIAGNOSIS — I4891 Unspecified atrial fibrillation: Secondary | ICD-10-CM

## 2020-05-16 DIAGNOSIS — M7989 Other specified soft tissue disorders: Secondary | ICD-10-CM

## 2020-05-16 NOTE — Telephone Encounter (Signed)
Spoke with son, DPR on file.  Son prefers [place] or Wed for appts.  Scheduled echo and BMET for 9/8 and appt with Dr. Acie Fredrickson on 9/13.  Son verbalized understanding and was appreciative for call.

## 2020-05-16 NOTE — Telephone Encounter (Signed)
-----   Message from Thayer Headings, MD sent at 05/16/2020  2:40 PM EDT ----- Please order an echo on Mr. Breit.  Please set up an appt with either an APP or me ( with a BMP) in the next several weeks to discuss starting a  diuretic.   Thanks  PN ----- Message ----- From: Irine Seal, MD Sent: 05/15/2020   3:09 PM EDT To: Evans Lance, MD, Thayer Headings, MD  Not sure if either of you are his primary cardiologist but I am seeing this fellow for nocturia.   He has some ankle edema and I wanted to see if you thought a mild daytime diuretic might help clear the edema so it doesn't mobilize at night.  If so could you send that in for him and let me know.    Thanks,  Jenny Reichmann

## 2020-05-17 ENCOUNTER — Telehealth: Payer: Self-pay | Admitting: *Deleted

## 2020-05-17 DIAGNOSIS — Z79899 Other long term (current) drug therapy: Secondary | ICD-10-CM

## 2020-05-17 MED ORDER — FUROSEMIDE 20 MG PO TABS
ORAL_TABLET | ORAL | 3 refills | Status: DC
Start: 2020-05-17 — End: 2020-11-12

## 2020-05-17 NOTE — Telephone Encounter (Signed)
-----   Message from Evans Lance, MD sent at 05/15/2020 10:46 PM EDT ----- I think lasix 20-40 mg daily would be fine. We will call this in for him. He will need a BMP in 2 weeks which we will arrange. GT ----- Message ----- From: Irine Seal, MD Sent: 05/15/2020   3:09 PM EDT To: Evans Lance, MD, Thayer Headings, MD  Not sure if either of you are his primary cardiologist but I am seeing this fellow for nocturia.   He has some ankle edema and I wanted to see if you thought a mild daytime diuretic might help clear the edema so it doesn't mobilize at night.  If so could you send that in for him and let me know.    Thanks,  Jenny Reichmann

## 2020-05-17 NOTE — Telephone Encounter (Signed)
Pt notified and voiced understanding 

## 2020-05-21 ENCOUNTER — Encounter (HOSPITAL_COMMUNITY): Payer: Self-pay | Admitting: Hematology and Oncology

## 2020-05-22 ENCOUNTER — Other Ambulatory Visit: Payer: Self-pay | Admitting: *Deleted

## 2020-05-22 DIAGNOSIS — D638 Anemia in other chronic diseases classified elsewhere: Secondary | ICD-10-CM

## 2020-05-22 NOTE — Progress Notes (Signed)
Patient Care Team: Patient, No Pcp Per as PCP - General (General Practice) Nahser, Wonda Cheng, MD as PCP - Cardiology (Cardiology)  DIAGNOSIS:    ICD-10-CM   1. Low grade myelodysplastic syndrome lesions ([place])  D46.20     CHIEF COMPLIANT: Follow-up ofanemia  INTERVAL HISTORY: Ricky [name] is a 86 y.o. with above-mentioned history of anemiacurrently receivingAranesp. He underwent a bone marrow biopsy on 05/06/20. He presents to the clinic todayfor follow-up.    ALLERGIES:  has No Known Allergies.  MEDICATIONS:  Current Outpatient Medications  Medication Sig Dispense Refill  . apixaban (ELIQUIS) 5 MG TABS tablet Take 1 tablet (5 mg total) by mouth 2 (two) times daily. 180 tablet 1  . cholecalciferol (VITAMIN D3) 25 MCG (1000 UNIT) tablet Take 1,000 Units by mouth daily.    . cyanocobalamin 1000 MCG tablet Take 1,000 mcg by mouth daily.    . furosemide (LASIX) 20 MG tablet May take 20 -40 mg Daily 120 tablet 3  . Magnesium 250 MG TABS Take 1 tablet (250 mg total) by mouth daily.  0  . simvastatin (ZOCOR) 20 MG tablet Take 20 mg by mouth daily.     No current facility-administered medications for this visit.    PHYSICAL EXAMINATION: ECOG PERFORMANCE STATUS: 1 - Symptomatic but completely ambulatory  Vitals:   05/23/20 1455  BP: (!) 120/57  Pulse: 92  Resp: 17  Temp: 97.6 F (36.4 C)  SpO2: 100%   Filed Weights   05/23/20 1455  Weight: 194 lb 14.4 oz (88.4 kg)    LABORATORY DATA:  I have reviewed the data as listed CMP Latest Ref Rng & Units 03/28/2020 03/22/2020 03/08/2020  Glucose 70 - 99 mg/dL 99 120(H) 97  BUN 8 - 23 mg/dL $Remove'19 23 19  'RkhWWWm$ Creatinine 0.61 - 1.24 mg/dL 1.09 1.07 0.99  Sodium 135 - 145 mmol/L 138 138 138  Potassium 3.5 - 5.1 mmol/L 4.7 4.2 5.1  Chloride 98 - 111 mmol/L 105 106 108  CO2 22 - 32 mmol/L $RemoveB'25 24 26  'baZqIUGi$ Calcium 8.9 - 10.3 mg/dL 8.9 8.5(L) 8.8(L)  Total Protein 6.5 - 8.1 g/dL 7.5 7.4 7.3  Total Bilirubin 0.3 - 1.2 mg/dL 1.1 1.0 0.8    Alkaline Phos 38 - 126 U/L 58 52 63  AST 15 - 41 U/L 13(L) 13(L) 11(L)  ALT 0 - 44 U/L $Remo'18 17 14    'FvHlc$ Lab Results  Component Value Date   WBC 6.3 05/23/2020   HGB 7.1 (L) 05/23/2020   HCT 22.5 (L) 05/23/2020   MCV 98.3 05/23/2020   PLT 170 05/23/2020   NEUTROABS 4.0 05/23/2020    ASSESSMENT & PLAN:  Low grade myelodysplastic syndrome lesions (HCC) Normocytic to macrocytic anemia:  Gastroenterology evaluation: Did not feel like he is a candidate for endoscopies or biopsies. Blood transfusion 02/21/2020, 03/23/20, 05/03/2020  Treatment plan:Aranesp every 3 weeks 500 mcg (increased from 300 mcg)  Lab review:  01/29/2020: Hemoglobin 7.8, MCV 103.4, platelets 149 03/28/2020: Hemoglobin is 9 (it was 6.9 on 03/22/2020) 04/12/2020: Hemoglobin is7.7 05/03/2020: Hemoglobin 6.5 05/23/2020: Hemoglobin 7.1  We will plan to administer Aranesp today Patient is planning to receive 2 units of PRBC tomorrow.  Bone marrow biopsy: Hypercellular bone marrow for age 69 to 60% cellularity with dyspoietic changes associated with abundant ring sideroblasts.  No increase in blastic cells identified.  Findings favor MDS with ringed sideroblasts  Pathology review: I discussed with the patient that this confirms a diagnosis of MDS. Awaiting the cytogenetics  and FISH profile. We will then be able to discuss prognosis. For the time being the plan is Aranesp and blood transfusions as needed.  Return to clinic in 3 weeks for labs and follow-up with injection appointment  No orders of the defined types were placed in this encounter.  The patient has a good understanding of the overall plan. he agrees with it. he will call with any problems that may develop before the next visit here.  Total time spent: 20 mins including face to face time and time spent for planning, charting and coordination of care  Nicholas Lose, MD 05/23/2020  I, Cloyde Reams Dorshimer, am acting as scribe for Dr. Nicholas Lose.  I have reviewed  the above documentation for accuracy and completeness, and I agree with the above.

## 2020-05-23 ENCOUNTER — Inpatient Hospital Stay: Payer: Medicare Other

## 2020-05-23 ENCOUNTER — Other Ambulatory Visit: Payer: Self-pay | Admitting: *Deleted

## 2020-05-23 ENCOUNTER — Other Ambulatory Visit: Payer: Self-pay

## 2020-05-23 ENCOUNTER — Inpatient Hospital Stay (HOSPITAL_BASED_OUTPATIENT_CLINIC_OR_DEPARTMENT_OTHER): Payer: Medicare Other | Admitting: Hematology and Oncology

## 2020-05-23 ENCOUNTER — Inpatient Hospital Stay: Payer: Medicare Other | Attending: Hematology and Oncology

## 2020-05-23 ENCOUNTER — Telehealth: Payer: Self-pay | Admitting: Hematology and Oncology

## 2020-05-23 ENCOUNTER — Other Ambulatory Visit: Payer: Medicare Other

## 2020-05-23 DIAGNOSIS — D638 Anemia in other chronic diseases classified elsewhere: Secondary | ICD-10-CM

## 2020-05-23 DIAGNOSIS — D4620 Refractory anemia with excess of blasts, unspecified: Secondary | ICD-10-CM | POA: Insufficient documentation

## 2020-05-23 LAB — CBC WITH DIFFERENTIAL (CANCER CENTER ONLY)
Abs Immature Granulocytes: 0.02 10*3/uL (ref 0.00–0.07)
Basophils Absolute: 0 10*3/uL (ref 0.0–0.1)
Basophils Relative: 0 %
Eosinophils Absolute: 0.2 10*3/uL (ref 0.0–0.5)
Eosinophils Relative: 2 %
HCT: 22.5 % — ABNORMAL LOW (ref 39.0–52.0)
Hemoglobin: 7.1 g/dL — ABNORMAL LOW (ref 13.0–17.0)
Immature Granulocytes: 0 %
Lymphocytes Relative: 17 %
Lymphs Abs: 1.1 10*3/uL (ref 0.7–4.0)
MCH: 31 pg (ref 26.0–34.0)
MCHC: 31.6 g/dL (ref 30.0–36.0)
MCV: 98.3 fL (ref 80.0–100.0)
Monocytes Absolute: 1 10*3/uL (ref 0.1–1.0)
Monocytes Relative: 15 %
Neutro Abs: 4 10*3/uL (ref 1.7–7.7)
Neutrophils Relative %: 66 %
Platelet Count: 170 10*3/uL (ref 150–400)
RBC: 2.29 MIL/uL — ABNORMAL LOW (ref 4.22–5.81)
RDW: 24.3 % — ABNORMAL HIGH (ref 11.5–15.5)
WBC Count: 6.3 10*3/uL (ref 4.0–10.5)
nRBC: 0.3 % — ABNORMAL HIGH (ref 0.0–0.2)

## 2020-05-23 LAB — SAMPLE TO BLOOD BANK

## 2020-05-23 MED ORDER — DARBEPOETIN ALFA 500 MCG/ML IJ SOSY
PREFILLED_SYRINGE | INTRAMUSCULAR | Status: AC
  Filled 2020-05-23: qty 1

## 2020-05-23 MED ORDER — DARBEPOETIN ALFA 500 MCG/ML IJ SOSY
500.0000 ug | PREFILLED_SYRINGE | Freq: Once | INTRAMUSCULAR | Status: AC
  Administered 2020-05-23: 500 ug via SUBCUTANEOUS

## 2020-05-23 NOTE — Telephone Encounter (Signed)
Scheduled appts per 9/2 los. Gave pt a print out of AVS.

## 2020-05-23 NOTE — Assessment & Plan Note (Signed)
Normocytic to macrocytic anemia:  Gastroenterology evaluation: Did not feel like he is a candidate for endoscopies or biopsies. Blood transfusion 02/21/2020, 03/23/20, 05/03/2020  Treatment plan:Aranesp every 3 weeks 500 mcg (increased from 300 mcg)  Lab review:  01/29/2020: Hemoglobin 7.8, MCV 103.4, platelets 149 03/28/2020: Hemoglobin is 9 (it was 6.9 on 03/22/2020) 04/12/2020: Hemoglobin is7.7 05/03/2020: Hemoglobin 6.5  Bone marrow biopsy: Hypercellular bone marrow for age 31 to 60% cellularity with dyspoietic changes associated with abundant ring sideroblasts.  No increase in blastic cells identified.  Findings favor MDS with ringed sideroblasts  Pathology review: I discussed with the patient that this confirms a diagnosis of MDS. Awaiting the cytogenetics and FISH profile.

## 2020-05-23 NOTE — Patient Instructions (Signed)
Darbepoetin Alfa injection What is this medicine? DARBEPOETIN ALFA (dar be POE e tin AL fa) helps your body make more red blood cells. It is used to treat anemia caused by chronic kidney failure and chemotherapy. This medicine may be used for other purposes; ask your health care provider or pharmacist if you have questions. COMMON BRAND NAME(S): Aranesp What should I tell my health care provider before I take this medicine? They need to know if you have any of these conditions:  blood clotting disorders or history of blood clots  cancer patient not on chemotherapy  cystic fibrosis  heart disease, such as angina, heart failure, or a history of a heart attack  hemoglobin level of 12 g/dL or greater  high blood pressure  low levels of folate, iron, or vitamin B12  seizures  an unusual or allergic reaction to darbepoetin, erythropoietin, albumin, hamster proteins, latex, other medicines, foods, dyes, or preservatives  pregnant or trying to get pregnant  breast-feeding How should I use this medicine? This medicine is for injection into a vein or under the skin. It is usually given by a health care professional in a hospital or clinic setting. If you get this medicine at home, you will be taught how to prepare and give this medicine. Use exactly as directed. Take your medicine at regular intervals. Do not take your medicine more often than directed. It is important that you put your used needles and syringes in a special sharps container. Do not put them in a trash can. If you do not have a sharps container, call your pharmacist or healthcare provider to get one. A special MedGuide will be given to you by the pharmacist with each prescription and refill. Be sure to read this information carefully each time. Talk to your pediatrician regarding the use of this medicine in children. While this medicine may be used in children as young as 1 month of age for selected conditions, precautions do  apply. Overdosage: If you think you have taken too much of this medicine contact a poison control center or emergency room at once. NOTE: This medicine is only for you. Do not share this medicine with others. What if I miss a dose? If you miss a dose, take it as soon as you can. If it is almost time for your next dose, take only that dose. Do not take double or extra doses. What may interact with this medicine? Do not take this medicine with any of the following medications:  epoetin alfa This list may not describe all possible interactions. Give your health care provider a list of all the medicines, herbs, non-prescription drugs, or dietary supplements you use. Also tell them if you smoke, drink alcohol, or use illegal drugs. Some items may interact with your medicine. What should I watch for while using this medicine? Your condition will be monitored carefully while you are receiving this medicine. You may need blood work done while you are taking this medicine. This medicine may cause a decrease in vitamin B6. You should make sure that you get enough vitamin B6 while you are taking this medicine. Discuss the foods you eat and the vitamins you take with your health care professional. What side effects may I notice from receiving this medicine? Side effects that you should report to your doctor or health care professional as soon as possible:  allergic reactions like skin rash, itching or hives, swelling of the face, lips, or tongue  breathing problems  changes in   vision  chest pain  confusion, trouble speaking or understanding  feeling faint or lightheaded, falls  high blood pressure  muscle aches or pains  pain, swelling, warmth in the leg  rapid weight gain  severe headaches  sudden numbness or weakness of the face, arm or leg  trouble walking, dizziness, loss of balance or coordination  seizures (convulsions)  swelling of the ankles, feet, hands  unusually weak or  tired Side effects that usually do not require medical attention (report to your doctor or health care professional if they continue or are bothersome):  diarrhea  fever, chills (flu-like symptoms)  headaches  nausea, vomiting  redness, stinging, or swelling at site where injected This list may not describe all possible side effects. Call your doctor for medical advice about side effects. You may report side effects to FDA at 1-800-FDA-1088. Where should I keep my medicine? Keep out of the reach of children. Store in a refrigerator between 2 and 8 degrees C (36 and 46 degrees F). Do not freeze. Do not shake. Throw away any unused portion if using a single-dose vial. Throw away any unused medicine after the expiration date. NOTE: This sheet is a summary. It may not cover all possible information. If you have questions about this medicine, talk to your doctor, pharmacist, or health care provider.  2020 Elsevier/Gold Standard (2017-09-22 16:44:20)  

## 2020-05-24 ENCOUNTER — Inpatient Hospital Stay: Payer: Medicare Other

## 2020-05-24 ENCOUNTER — Other Ambulatory Visit: Payer: Medicare Other

## 2020-05-24 ENCOUNTER — Other Ambulatory Visit: Payer: Self-pay

## 2020-05-24 DIAGNOSIS — D638 Anemia in other chronic diseases classified elsewhere: Secondary | ICD-10-CM

## 2020-05-24 DIAGNOSIS — D4620 Refractory anemia with excess of blasts, unspecified: Secondary | ICD-10-CM | POA: Diagnosis not present

## 2020-05-24 LAB — PREPARE RBC (CROSSMATCH)

## 2020-05-24 MED ORDER — DIPHENHYDRAMINE HCL 25 MG PO CAPS
ORAL_CAPSULE | ORAL | Status: AC
  Filled 2020-05-24: qty 1

## 2020-05-24 MED ORDER — ACETAMINOPHEN 325 MG PO TABS
ORAL_TABLET | ORAL | Status: AC
  Filled 2020-05-24: qty 2

## 2020-05-24 MED ORDER — ACETAMINOPHEN 325 MG PO TABS
650.0000 mg | ORAL_TABLET | Freq: Once | ORAL | Status: AC
  Administered 2020-05-24: 650 mg via ORAL

## 2020-05-24 MED ORDER — SODIUM CHLORIDE 0.9% IV SOLUTION
250.0000 mL | Freq: Once | INTRAVENOUS | Status: AC
  Administered 2020-05-24: 250 mL via INTRAVENOUS
  Filled 2020-05-24: qty 250

## 2020-05-24 MED ORDER — DIPHENHYDRAMINE HCL 25 MG PO CAPS
25.0000 mg | ORAL_CAPSULE | Freq: Once | ORAL | Status: AC
  Administered 2020-05-24: 25 mg via ORAL

## 2020-05-24 NOTE — Patient Instructions (Signed)

## 2020-05-25 LAB — TYPE AND SCREEN
ABO/RH(D): B POS
Antibody Screen: POSITIVE
DAT, IgG: POSITIVE
Donor AG Type: NEGATIVE
Donor AG Type: NEGATIVE
Unit division: 0
Unit division: 0

## 2020-05-25 LAB — BPAM RBC
Blood Product Expiration Date: 202109262359
Blood Product Expiration Date: 202109282359
ISSUE DATE / TIME: 202109031152
ISSUE DATE / TIME: 202109031152
Unit Type and Rh: 5100
Unit Type and Rh: 5100

## 2020-05-29 ENCOUNTER — Ambulatory Visit (HOSPITAL_COMMUNITY): Payer: Medicare Other | Attending: Cardiology

## 2020-05-29 ENCOUNTER — Other Ambulatory Visit: Payer: Self-pay

## 2020-05-29 ENCOUNTER — Other Ambulatory Visit: Payer: Medicare Other | Admitting: *Deleted

## 2020-05-29 DIAGNOSIS — M7989 Other specified soft tissue disorders: Secondary | ICD-10-CM

## 2020-05-29 DIAGNOSIS — I4891 Unspecified atrial fibrillation: Secondary | ICD-10-CM

## 2020-05-29 LAB — BASIC METABOLIC PANEL
BUN/Creatinine Ratio: 20 (ref 10–24)
BUN: 24 mg/dL (ref 8–27)
CO2: 25 mmol/L (ref 20–29)
Calcium: 8.9 mg/dL (ref 8.6–10.2)
Chloride: 102 mmol/L (ref 96–106)
Creatinine, Ser: 1.18 mg/dL (ref 0.76–1.27)
GFR calc Af Amer: 64 mL/min/{1.73_m2} (ref >59)
GFR calc non Af Amer: 55 mL/min/{1.73_m2} — ABNORMAL LOW (ref >59)
Glucose: 91 mg/dL (ref 65–99)
Potassium: 4.5 mmol/L (ref 3.5–5.2)
Sodium: 137 mmol/L (ref 134–144)

## 2020-05-29 LAB — ECHOCARDIOGRAM COMPLETE
Area-P 1/2: 3.12 cm2
S' Lateral: 3.3 cm

## 2020-06-02 ENCOUNTER — Encounter: Payer: Self-pay | Admitting: Cardiovascular Disease

## 2020-06-02 NOTE — Progress Notes (Signed)
Cardiology Office Note:    Date:  06/03/2020   ID:  Ricky [name], DOB 1931/07/25, MRN [id number]  PCP:  Patient, No Pcp Per  [place] Cardiologist:  New, Ricky [name]  [facility] Electrophysiologist:  None   Referring MD: No ref. provider found   Chief Complaint  Patient presents with  . Atrial Fibrillation     02/28/20     Ricky [name] is a 86 y.o. male with a hx of atrial fib, pacer, chronic anticoagulation .   We were asked to see him today by Clide Deutscher, NP for further management of his atrial fib and pacer.   He is on coumadin   No CP or dyspnea.  Has slowed down  Lives in [place] now.   No syncope .  Has chronic anemia Hb is 7.2 as of last month   Hx of HLD .    Very little exercise .  Due to lack of energy   Had pacer placed up in [place] several years ago.  Does not know what brand  Sept. 13, 2021:  Seen with his son, Ricky [name].  Ricky [name] is seen today for follow up of his pacer and HTN He has MDS.  Is chronically anemic.  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  No CP or dyspnea Pacer is functioning normally   Was having some leg swelling and having significant nocturia  ( see telephone note from Dr. Irine Seal)  Was started Lasix 20 mg  And he was scheduled for a follow up appt  .  Takes lasix  at 4 PM.   ( which is why it did not help with his nocturia )  Leg edema has improved  Advised him to take the lasix in the am     Past Medical History:  Diagnosis Date  . A-fib ([place])   . Anemia   . Anemia of chronic disease 02/02/2020  . Atrial fibrillation ([place])   . Boerhaave's syndrome   . Chronic anticoagulation 01/22/2020  . Colon polyps   . COVID-19   . Hx of colonic polyps 01/22/2020   2017 - [place]  . Hypomagnesemia   . Low grade myelodysplastic syndrome lesions ([place]) 02/02/2020  . Lower urinary tract symptoms due to benign prostatic hyperplasia   . Pacemaker   . Paroxysmal atrial  fibrillation (HCC)   . Prostate enlargement   . Symptomatic anemia 12/26/2019  . Vitamin D deficiency     Past Surgical History:  Procedure Laterality Date  . esophogeal tear repair    . PACEMAKER INSERTION  2009, 2017  . THORACOTOMY     esophageal perforation  . TOTAL HIP ARTHROPLASTY Right   . TOTAL KNEE ARTHROPLASTY Left     Current Medications: Current Meds  Medication Sig  . apixaban (ELIQUIS) 5 MG TABS tablet Take 1 tablet (5 mg total) by mouth 2 (two) times daily.  . Azelastine HCl 0.15 % SOLN Place 1 spray into the nose as needed.  . cholecalciferol (VITAMIN D3) 25 MCG (1000 UNIT) tablet Take 1,000 Units by mouth daily.  . cyanocobalamin 1000 MCG tablet Take 1,000 mcg by mouth daily.  Marland Kitchen desonide (DESOWEN) 0.05 % cream Apply 1 application topically 2 (two) times daily.  . fluorouracil (EFUDEX) 5 % cream Apply 1 application topically 2 (two) times daily.  . furosemide (LASIX) 20 MG tablet May take 20 -40 mg Daily  . hydrocortisone 2.5 %  cream Apply 1 application topically as directed.  . Magnesium 250 MG TABS Take 1 tablet (250 mg total) by mouth daily.  . mupirocin ointment (BACTROBAN) 2 % Apply 1 application topically as directed.  . simvastatin (ZOCOR) 20 MG tablet Take 20 mg by mouth daily.     Allergies:   Patient has no known allergies.   Social History   Socioeconomic History  . Marital status: Married    Spouse name: Not on file  . Number of children: 2  . Years of education: Not on file  . Highest education level: Not on file  Occupational History  . Occupation: retired  Tobacco Use  . Smoking status: Former Smoker    Types: Cigarettes    Quit date: 01/21/1970    Years since quitting: 50.4  . Smokeless tobacco: Never Used  Vaping Use  . Vaping Use: Never used  Substance and Sexual Activity  . Alcohol use: Not Currently  . Drug use: Never  . Sexual activity: Yes  Other Topics Concern  . Not on file  Social History Narrative  . Not on file    Social Determinants of Health   Financial Resource Strain:   . Difficulty of Paying Living Expenses: Not on file  Food Insecurity:   . Worried About Charity fundraiser in the Last Year: Not on file  . Ran Out of Food in the Last Year: Not on file  Transportation Needs:   . Lack of Transportation (Medical): Not on file  . Lack of Transportation (Non-Medical): Not on file  Physical Activity:   . Days of Exercise per Week: Not on file  . Minutes of Exercise per Session: Not on file  Stress:   . Feeling of Stress : Not on file  Social Connections:   . Frequency of Communication with Friends and Family: Not on file  . Frequency of Social Gatherings with Friends and Family: Not on file  . Attends Religious Services: Not on file  . Active Member of Clubs or Organizations: Not on file  . Attends Archivist Meetings: Not on file  . Marital Status: Not on file     Family History: The patient's family history includes Alcoholism in his mother; Brain cancer in his daughter; Cirrhosis in his mother; Diabetes in his brother; Heart attack (age of onset: 62) in his father; Heart disease in his brother.  ROS:   Please see the history of present illness.     All other systems reviewed and are negative.  EKGs/Labs/Other Studies Reviewed:    The following studies were reviewed today:   EKG:     Recent Labs: 01/29/2020: TSH 4.308 03/28/2020: ALT 18 05/23/2020: Hemoglobin 7.1; Platelet Count 170 06/03/2020: BUN 29; Creatinine, Ser 1.21; Potassium 4.4; Sodium 137  Recent Lipid Panel    Component Value Date/Time   CHOL 139 02/28/2020 1418   TRIG 60 02/28/2020 1418   HDL 53 02/28/2020 1418   CHOLHDL 2.6 02/28/2020 1418   LDLCALC 73 02/28/2020 1418    Physical Exam:    Physical Exam: Blood pressure 110/62, pulse 76, height 5\' 11"  (1.803 m), weight 191 lb 12.8 oz (87 kg), SpO2 96 %.  GEN:  Well nourished, well developed in no acute distress HEENT: Normal NECK: No JVD; No  carotid bruits LYMPHATICS: No lymphadenopathy CARDIAC: RRR , no murmurs, rubs, gallops RESPIRATORY:  Clear to auscultation without rales, wheezing or rhonchi  ABDOMEN: Soft, non-tender, non-distended MUSCULOSKELETAL:  No edema; No deformity  SKIN:  Warm and dry NEUROLOGIC:  Alert and oriented x 3   ASSESSMENT:    1. Swelling of lower extremity   2. Paroxysmal atrial fibrillation (HCC)   3. Chronic anticoagulation    PLAN:    In order of problems listed above:  1. Paroxysmal atrial fib : hx of PAF.    Appears to be in sinus rhythm today .  He is on coumadin  Overall he appears to be stable   2.   Pacemaker:  .   Followed by EP   3.  Leg edema :  His edema is better.   Echo showed normal LV function .  Advised him to take his lasix in the am instead of evening to avoid nocturia .   Medication Adjustments/Labs and Tests Ordered: Current medicines are reviewed at length with the patient today.  Concerns regarding medicines are outlined above.  Orders Placed This Encounter  Procedures  . Basic metabolic panel   No orders of the defined types were placed in this encounter.   Patient Instructions  Medication Instructions:  Your physician recommends that you continue on your current medications as directed. Please refer to the Current Medication list given to you today.  *If you need a refill on your cardiac medications before your next appointment, please call your pharmacy*   Lab Work: TODAY: BMET  If you have labs (blood work) drawn today and your tests are completely normal, you will receive your results only by: Marland Kitchen MyChart Message (if you have MyChart) OR . A paper copy in the mail If you have any lab test that is abnormal or we need to change your treatment, we will call you to review the results.   Testing/Procedures: None   Follow-Up: Follow up with Dr. Acie Fredrickson on 12/02/20 at 8:00 AM   Other Instructions None     Signed, Mertie Moores, MD  06/03/2020  5:48 PM    [place]

## 2020-06-03 ENCOUNTER — Encounter: Payer: Self-pay | Admitting: Cardiovascular Disease

## 2020-06-03 ENCOUNTER — Ambulatory Visit (INDEPENDENT_AMBULATORY_CARE_PROVIDER_SITE_OTHER): Payer: Medicare Other | Admitting: Cardiovascular Disease

## 2020-06-03 ENCOUNTER — Other Ambulatory Visit: Payer: Self-pay

## 2020-06-03 VITALS — BP 110/62 | HR 76 | Ht 71.0 in | Wt 191.8 lb

## 2020-06-03 DIAGNOSIS — Z7901 Long term (current) use of anticoagulants: Secondary | ICD-10-CM

## 2020-06-03 DIAGNOSIS — M7989 Other specified soft tissue disorders: Secondary | ICD-10-CM | POA: Diagnosis not present

## 2020-06-03 DIAGNOSIS — I480 Paroxysmal atrial fibrillation: Secondary | ICD-10-CM

## 2020-06-03 LAB — BASIC METABOLIC PANEL
BUN/Creatinine Ratio: 24 (ref 10–24)
BUN: 29 mg/dL — ABNORMAL HIGH (ref 8–27)
CO2: 24 mmol/L (ref 20–29)
Calcium: 9 mg/dL (ref 8.6–10.2)
Chloride: 102 mmol/L (ref 96–106)
Creatinine, Ser: 1.21 mg/dL (ref 0.76–1.27)
GFR calc Af Amer: 62 mL/min/{1.73_m2} (ref >59)
GFR calc non Af Amer: 54 mL/min/{1.73_m2} — ABNORMAL LOW (ref >59)
Glucose: 108 mg/dL — ABNORMAL HIGH (ref 65–99)
Potassium: 4.4 mmol/L (ref 3.5–5.2)
Sodium: 137 mmol/L (ref 134–144)

## 2020-06-03 NOTE — Patient Instructions (Signed)
Medication Instructions:  Your physician recommends that you continue on your current medications as directed. Please refer to the Current Medication list given to you today.  *If you need a refill on your cardiac medications before your next appointment, please call your pharmacy*   Lab Work: TODAY: BMET  If you have labs (blood work) drawn today and your tests are completely normal, you will receive your results only by: Marland Kitchen MyChart Message (if you have MyChart) OR . A paper copy in the mail If you have any lab test that is abnormal or we need to change your treatment, we will call you to review the results.   Testing/Procedures: None   Follow-Up: Follow up with Dr. Acie Fredrickson on 12/02/20 at 8:00 AM   Other Instructions None

## 2020-06-05 ENCOUNTER — Other Ambulatory Visit: Payer: Self-pay | Admitting: *Deleted

## 2020-06-05 DIAGNOSIS — D638 Anemia in other chronic diseases classified elsewhere: Secondary | ICD-10-CM

## 2020-06-05 NOTE — Progress Notes (Signed)
Received call from pt Son Ed stating pt is experiencing episodes of dizziness and light headedness. Requesting if orders can be sent to [place] at [facility] [phone]) to have CBC dawn.  Per MD okay to send orders for CBC to be drawn today and results faxed back to our office for further evaluation and management. Labs successfully faxed to [phone].

## 2020-06-06 ENCOUNTER — Telehealth: Payer: Self-pay

## 2020-06-06 NOTE — Telephone Encounter (Signed)
Patient's son called requesting order for CBC to be faxed to [place].  Fax [phone]  Orders successfully faxed, patient's son aware.

## 2020-06-13 NOTE — Progress Notes (Signed)
Patient Care Team: Patient, No Pcp Per as PCP - General (General Practice) Nahser, Wonda Cheng, MD as PCP - Cardiology (Cardiology)  DIAGNOSIS:    ICD-10-CM   1. Low grade myelodysplastic syndrome lesions ([place])  D46.20     CHIEF COMPLIANT: Follow-up of MDS  INTERVAL HISTORY: Ricky [name] is a 86 y.o. with above-mentioned history of MDS currently receivingAranesp.He recieved a unit of PRBCs on both 05/23/20 and 05/24/20. He presents to the clinic todayfor follow-up.  He reports that he is feeling reasonably well.  He does not feel dizzy lightheaded or short of breath.  He walks with the help of a walker.  He is accompanied by his daughter.  ALLERGIES:  has No Known Allergies.  MEDICATIONS:  Current Outpatient Medications  Medication Sig Dispense Refill  . apixaban (ELIQUIS) 5 MG TABS tablet Take 1 tablet (5 mg total) by mouth 2 (two) times daily. 180 tablet 1  . Azelastine HCl 0.15 % SOLN Place 1 spray into the nose as needed.    . cholecalciferol (VITAMIN D3) 25 MCG (1000 UNIT) tablet Take 1,000 Units by mouth daily.    . cyanocobalamin 1000 MCG tablet Take 1,000 mcg by mouth daily.    Marland Kitchen desonide (DESOWEN) 0.05 % cream Apply 1 application topically 2 (two) times daily.    . fluorouracil (EFUDEX) 5 % cream Apply 1 application topically 2 (two) times daily.    . furosemide (LASIX) 20 MG tablet May take 20 -40 mg Daily 120 tablet 3  . hydrocortisone 2.5 % cream Apply 1 application topically as directed.    . Magnesium 250 MG TABS Take 1 tablet (250 mg total) by mouth daily.  0  . mupirocin ointment (BACTROBAN) 2 % Apply 1 application topically as directed.    . simvastatin (ZOCOR) 20 MG tablet Take 20 mg by mouth daily.     No current facility-administered medications for this visit.    PHYSICAL EXAMINATION: ECOG PERFORMANCE STATUS: 1 - Symptomatic but completely ambulatory  There were no vitals filed for this visit. There were no vitals filed for this visit.  LABORATORY  DATA:  I have reviewed the data as listed CMP Latest Ref Rng & Units 06/03/2020 05/29/2020 03/28/2020  Glucose 65 - 99 mg/dL 108(H) 91 99  BUN 8 - 27 mg/dL 29(H) 24 19  Creatinine 0.76 - 1.27 mg/dL 1.21 1.18 1.09  Sodium 134 - 144 mmol/L 137 137 138  Potassium 3.5 - 5.2 mmol/L 4.4 4.5 4.7  Chloride 96 - 106 mmol/L 102 102 105  CO2 20 - 29 mmol/L $RemoveB'24 25 25  'zjDItqei$ Calcium 8.6 - 10.2 mg/dL 9.0 8.9 8.9  Total Protein 6.5 - 8.1 g/dL - - 7.5  Total Bilirubin 0.3 - 1.2 mg/dL - - 1.1  Alkaline Phos 38 - 126 U/L - - 58  AST 15 - 41 U/L - - 13(L)  ALT 0 - 44 U/L - - 18    Lab Results  Component Value Date   WBC 6.3 05/23/2020   HGB 7.1 (L) 05/23/2020   HCT 22.5 (L) 05/23/2020   MCV 98.3 05/23/2020   PLT 170 05/23/2020   NEUTROABS 4.0 05/23/2020    ASSESSMENT & PLAN:  Low grade myelodysplastic syndrome lesions (HCC) Normocytic to macrocytic anemia:  Gastroenterology evaluation: Did not feel like he is a candidate for endoscopies or biopsies. Blood transfusion 02/21/2020, 03/23/20, 05/03/2020  Treatment plan:Aranesp every 3 weeks 500 mcg (increased from 300 mcg)  Lab review:  01/29/2020: Hemoglobin 7.8, MCV 103.4,  platelets 149 03/28/2020: Hemoglobin is 9 (it was 6.9 on 03/22/2020) 04/12/2020: Hemoglobin is7.7 05/03/2020: Hemoglobin 6.5 05/23/2020: Hemoglobin 7.1 (received blood transfusion) 06/14/2020: Hemoglobin 7.8  Bone marrow biopsy: Hypercellular bone marrow for age 70 to 60% cellularity with dyspoietic changes associated with abundant ring sideroblasts.  No increase in blastic cells identified.  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 Hold off on blood transfusion.  No orders of the defined types were placed in this encounter.  The patient has a good  understanding of the overall plan. he agrees with it. he will call with any problems that may develop before the next visit here.  Total time spent: 30 mins including face to face time and time spent for planning, charting and coordination of care  Nicholas Lose, MD 06/14/2020  I, Cloyde Reams Dorshimer, am acting as scribe for Dr. Nicholas Lose.  I have reviewed the above documentation for accuracy and completeness, and I agree with the above.

## 2020-06-14 ENCOUNTER — Inpatient Hospital Stay: Payer: Medicare Other | Admitting: Hematology and Oncology

## 2020-06-14 ENCOUNTER — Inpatient Hospital Stay: Payer: Medicare Other

## 2020-06-14 ENCOUNTER — Other Ambulatory Visit: Payer: Self-pay | Admitting: Hematology and Oncology

## 2020-06-14 ENCOUNTER — Other Ambulatory Visit: Payer: Self-pay

## 2020-06-14 DIAGNOSIS — D638 Anemia in other chronic diseases classified elsewhere: Secondary | ICD-10-CM

## 2020-06-14 DIAGNOSIS — D4620 Refractory anemia with excess of blasts, unspecified: Secondary | ICD-10-CM | POA: Diagnosis not present

## 2020-06-14 LAB — SAMPLE TO BLOOD BANK

## 2020-06-14 LAB — CBC WITH DIFFERENTIAL (CANCER CENTER ONLY)
Abs Immature Granulocytes: 0.01 10*3/uL (ref 0.00–0.07)
Basophils Absolute: 0 10*3/uL (ref 0.0–0.1)
Basophils Relative: 0 %
Eosinophils Absolute: 0.1 10*3/uL (ref 0.0–0.5)
Eosinophils Relative: 3 %
HCT: 24.1 % — ABNORMAL LOW (ref 39.0–52.0)
Hemoglobin: 7.8 g/dL — ABNORMAL LOW (ref 13.0–17.0)
Immature Granulocytes: 0 %
Lymphocytes Relative: 22 %
Lymphs Abs: 0.9 10*3/uL (ref 0.7–4.0)
MCH: 30.6 pg (ref 26.0–34.0)
MCHC: 32.4 g/dL (ref 30.0–36.0)
MCV: 94.5 fL (ref 80.0–100.0)
Monocytes Absolute: 0.9 10*3/uL (ref 0.1–1.0)
Monocytes Relative: 23 %
Neutro Abs: 2 10*3/uL (ref 1.7–7.7)
Neutrophils Relative %: 52 %
Platelet Count: 160 10*3/uL (ref 150–400)
RBC: 2.55 MIL/uL — ABNORMAL LOW (ref 4.22–5.81)
RDW: 22.5 % — ABNORMAL HIGH (ref 11.5–15.5)
WBC Count: 3.9 10*3/uL — ABNORMAL LOW (ref 4.0–10.5)
nRBC: 0 % (ref 0.0–0.2)

## 2020-06-14 LAB — IRON AND TIBC
Iron: 191 ug/dL — ABNORMAL HIGH (ref 42–163)
Saturation Ratios: 100 % — ABNORMAL HIGH (ref 20–55)
TIBC: 190 ug/dL — ABNORMAL LOW (ref 202–409)
UIBC: UNDETERMINED ug/dL (ref 117–376)

## 2020-06-14 LAB — FERRITIN: Ferritin: 1961 ng/mL — ABNORMAL HIGH (ref 24–336)

## 2020-06-14 MED ORDER — DARBEPOETIN ALFA 500 MCG/ML IJ SOSY
PREFILLED_SYRINGE | INTRAMUSCULAR | Status: AC
  Filled 2020-06-14: qty 1

## 2020-06-14 MED ORDER — DARBEPOETIN ALFA 500 MCG/ML IJ SOSY
500.0000 ug | PREFILLED_SYRINGE | Freq: Once | INTRAMUSCULAR | Status: AC
  Administered 2020-06-14: 500 ug via SUBCUTANEOUS

## 2020-06-14 NOTE — Patient Instructions (Signed)
Darbepoetin Alfa injection What is this medicine? DARBEPOETIN ALFA (dar be POE e tin AL fa) helps your body make more red blood cells. It is used to treat anemia caused by chronic kidney failure and chemotherapy. This medicine may be used for other purposes; ask your health care provider or pharmacist if you have questions. COMMON BRAND NAME(S): Aranesp What should I tell my health care provider before I take this medicine? They need to know if you have any of these conditions:  blood clotting disorders or history of blood clots  cancer patient not on chemotherapy  cystic fibrosis  heart disease, such as angina, heart failure, or a history of a heart attack  hemoglobin level of 12 g/dL or greater  high blood pressure  low levels of folate, iron, or vitamin B12  seizures  an unusual or allergic reaction to darbepoetin, erythropoietin, albumin, hamster proteins, latex, other medicines, foods, dyes, or preservatives  pregnant or trying to get pregnant  breast-feeding How should I use this medicine? This medicine is for injection into a vein or under the skin. It is usually given by a health care professional in a hospital or clinic setting. If you get this medicine at home, you will be taught how to prepare and give this medicine. Use exactly as directed. Take your medicine at regular intervals. Do not take your medicine more often than directed. It is important that you put your used needles and syringes in a special sharps container. Do not put them in a trash can. If you do not have a sharps container, call your pharmacist or healthcare provider to get one. A special MedGuide will be given to you by the pharmacist with each prescription and refill. Be sure to read this information carefully each time. Talk to your pediatrician regarding the use of this medicine in children. While this medicine may be used in children as young as 1 month of age for selected conditions, precautions do  apply. Overdosage: If you think you have taken too much of this medicine contact a poison control center or emergency room at once. NOTE: This medicine is only for you. Do not share this medicine with others. What if I miss a dose? If you miss a dose, take it as soon as you can. If it is almost time for your next dose, take only that dose. Do not take double or extra doses. What may interact with this medicine? Do not take this medicine with any of the following medications:  epoetin alfa This list may not describe all possible interactions. Give your health care provider a list of all the medicines, herbs, non-prescription drugs, or dietary supplements you use. Also tell them if you smoke, drink alcohol, or use illegal drugs. Some items may interact with your medicine. What should I watch for while using this medicine? Your condition will be monitored carefully while you are receiving this medicine. You may need blood work done while you are taking this medicine. This medicine may cause a decrease in vitamin B6. You should make sure that you get enough vitamin B6 while you are taking this medicine. Discuss the foods you eat and the vitamins you take with your health care professional. What side effects may I notice from receiving this medicine? Side effects that you should report to your doctor or health care professional as soon as possible:  allergic reactions like skin rash, itching or hives, swelling of the face, lips, or tongue  breathing problems  changes in   vision  chest pain  confusion, trouble speaking or understanding  feeling faint or lightheaded, falls  high blood pressure  muscle aches or pains  pain, swelling, warmth in the leg  rapid weight gain  severe headaches  sudden numbness or weakness of the face, arm or leg  trouble walking, dizziness, loss of balance or coordination  seizures (convulsions)  swelling of the ankles, feet, hands  unusually weak or  tired Side effects that usually do not require medical attention (report to your doctor or health care professional if they continue or are bothersome):  diarrhea  fever, chills (flu-like symptoms)  headaches  nausea, vomiting  redness, stinging, or swelling at site where injected This list may not describe all possible side effects. Call your doctor for medical advice about side effects. You may report side effects to FDA at 1-800-FDA-1088. Where should I keep my medicine? Keep out of the reach of children. Store in a refrigerator between 2 and 8 degrees C (36 and 46 degrees F). Do not freeze. Do not shake. Throw away any unused portion if using a single-dose vial. Throw away any unused medicine after the expiration date. NOTE: This sheet is a summary. It may not cover all possible information. If you have questions about this medicine, talk to your doctor, pharmacist, or health care provider.  2020 Elsevier/Gold Standard (2017-09-22 16:44:20)  

## 2020-06-14 NOTE — Assessment & Plan Note (Signed)
Normocytic to macrocytic anemia:  Gastroenterology evaluation: Did not feel like he is a candidate for endoscopies or biopsies. Blood transfusion 02/21/2020, 03/23/20, 05/03/2020  Treatment plan:Aranesp every 3 weeks 500 mcg (increased from 300 mcg)  Lab review:  01/29/2020: Hemoglobin 7.8, MCV 103.4, platelets 149 03/28/2020: Hemoglobin is 9 (it was 6.9 on 03/22/2020) 04/12/2020: Hemoglobin is7.7 05/03/2020: Hemoglobin 6.5 05/23/2020: Hemoglobin 7.1 (received blood transfusion)  Bone marrow biopsy: Hypercellular bone marrow for age 21 to 60% cellularity with dyspoietic changes associated with abundant ring sideroblasts.  No increase in blastic cells identified.  Findings favor MDS with ringed sideroblasts Cytogenetics and FISH: SF3B1 mutation detected (present in 80% of MDS with ringed sideroblasts) no other mutations  Treatment plan: Aranesp and blood transfusions and supportive care

## 2020-06-15 ENCOUNTER — Ambulatory Visit: Payer: Medicare Other

## 2020-06-18 ENCOUNTER — Telehealth: Payer: Self-pay | Admitting: Hematology and Oncology

## 2020-06-18 NOTE — Telephone Encounter (Signed)
Scheduled appts per 9/24 los. Pt to get updated appt calendar at next visit per appt notes.

## 2020-06-24 ENCOUNTER — Telehealth: Payer: Self-pay | Admitting: Internal Medicine

## 2020-06-24 NOTE — Telephone Encounter (Signed)
  1. Has your device fired? no  2. Is you device beeping? no  3. Are you experiencing draining or swelling at device site? no  4. Are you calling to see if we received your device transmission? no  5. Have you passed out? no   Patient's son calling for instructions on how to send in a transmission.  Please route to [place]

## 2020-06-24 NOTE — Telephone Encounter (Signed)
I spoke with the pt son and gave him verbal instructions on how to send a transmission.

## 2020-06-27 ENCOUNTER — Ambulatory Visit (INDEPENDENT_AMBULATORY_CARE_PROVIDER_SITE_OTHER): Payer: Medicare Other

## 2020-06-27 DIAGNOSIS — I495 Sick sinus syndrome: Secondary | ICD-10-CM | POA: Diagnosis not present

## 2020-06-27 LAB — CUP PACEART REMOTE DEVICE CHECK
Battery Remaining Longevity: 108 mo
Battery Remaining Percentage: 95.5 %
Battery Voltage: 2.99 V
Brady Statistic AP VP Percent: 1 %
Brady Statistic AP VS Percent: 2.2 %
Brady Statistic AS VP Percent: 1.7 %
Brady Statistic AS VS Percent: 96 %
Brady Statistic RA Percent Paced: 1.8 %
Brady Statistic RV Percent Paced: 1.7 %
Date Time Interrogation Session: 20211006192240
Implantable Lead Implant Date: 20090807
Implantable Lead Implant Date: 20090807
Implantable Lead Location: 753859
Implantable Lead Location: 753860
Implantable Pulse Generator Implant Date: 20170719
Lead Channel Impedance Value: 290 Ohm
Lead Channel Impedance Value: 310 Ohm
Lead Channel Pacing Threshold Amplitude: 0.5 V
Lead Channel Pacing Threshold Amplitude: 1.25 V
Lead Channel Pacing Threshold Pulse Width: 0.5 ms
Lead Channel Pacing Threshold Pulse Width: 1 ms
Lead Channel Sensing Intrinsic Amplitude: 2.6 mV
Lead Channel Sensing Intrinsic Amplitude: 6.6 mV
Lead Channel Setting Pacing Amplitude: 2.25 V
Lead Channel Setting Pacing Amplitude: 2.5 V
Lead Channel Setting Pacing Pulse Width: 1 ms
Lead Channel Setting Sensing Sensitivity: 2 mV
Pulse Gen Model: 2272
Pulse Gen Serial Number: 7927766

## 2020-07-01 NOTE — Progress Notes (Signed)
Remote pacemaker transmission.   

## 2020-07-05 ENCOUNTER — Inpatient Hospital Stay: Payer: Medicare Other | Attending: Hematology and Oncology

## 2020-07-05 ENCOUNTER — Other Ambulatory Visit: Payer: Self-pay | Admitting: *Deleted

## 2020-07-05 ENCOUNTER — Other Ambulatory Visit: Payer: Self-pay

## 2020-07-05 ENCOUNTER — Encounter: Payer: Self-pay | Admitting: *Deleted

## 2020-07-05 ENCOUNTER — Inpatient Hospital Stay: Payer: Medicare Other

## 2020-07-05 VITALS — BP 133/63 | HR 90 | Resp 16

## 2020-07-05 DIAGNOSIS — C4360 Malignant melanoma of unspecified upper limb, including shoulder: Secondary | ICD-10-CM | POA: Insufficient documentation

## 2020-07-05 DIAGNOSIS — D4620 Refractory anemia with excess of blasts, unspecified: Secondary | ICD-10-CM

## 2020-07-05 DIAGNOSIS — D2330 Other benign neoplasm of skin of unspecified part of face: Secondary | ICD-10-CM | POA: Insufficient documentation

## 2020-07-05 DIAGNOSIS — Z96652 Presence of left artificial knee joint: Secondary | ICD-10-CM | POA: Insufficient documentation

## 2020-07-05 DIAGNOSIS — D485 Neoplasm of uncertain behavior of skin: Secondary | ICD-10-CM | POA: Insufficient documentation

## 2020-07-05 DIAGNOSIS — D367 Benign neoplasm of other specified sites: Secondary | ICD-10-CM | POA: Insufficient documentation

## 2020-07-05 DIAGNOSIS — N138 Other obstructive and reflux uropathy: Secondary | ICD-10-CM | POA: Insufficient documentation

## 2020-07-05 DIAGNOSIS — C4441 Basal cell carcinoma of skin of scalp and neck: Secondary | ICD-10-CM | POA: Insufficient documentation

## 2020-07-05 DIAGNOSIS — Z85820 Personal history of malignant melanoma of skin: Secondary | ICD-10-CM | POA: Insufficient documentation

## 2020-07-05 DIAGNOSIS — L570 Actinic keratosis: Secondary | ICD-10-CM | POA: Insufficient documentation

## 2020-07-05 DIAGNOSIS — N401 Enlarged prostate with lower urinary tract symptoms: Secondary | ICD-10-CM | POA: Insufficient documentation

## 2020-07-05 LAB — SAMPLE TO BLOOD BANK

## 2020-07-05 LAB — CBC WITH DIFFERENTIAL (CANCER CENTER ONLY)
Abs Immature Granulocytes: 0.02 10*3/uL (ref 0.00–0.07)
Basophils Absolute: 0 10*3/uL (ref 0.0–0.1)
Basophils Relative: 1 %
Eosinophils Absolute: 0.2 10*3/uL (ref 0.0–0.5)
Eosinophils Relative: 4 %
HCT: 19.1 % — ABNORMAL LOW (ref 39.0–52.0)
Hemoglobin: 6.2 g/dL — CL (ref 13.0–17.0)
Immature Granulocytes: 1 %
Lymphocytes Relative: 27 %
Lymphs Abs: 1 10*3/uL (ref 0.7–4.0)
MCH: 31.5 pg (ref 26.0–34.0)
MCHC: 32.5 g/dL (ref 30.0–36.0)
MCV: 97 fL (ref 80.0–100.0)
Monocytes Absolute: 0.9 10*3/uL (ref 0.1–1.0)
Monocytes Relative: 24 %
Neutro Abs: 1.6 10*3/uL — ABNORMAL LOW (ref 1.7–7.7)
Neutrophils Relative %: 43 %
Platelet Count: 173 10*3/uL (ref 150–400)
RBC: 1.97 MIL/uL — ABNORMAL LOW (ref 4.22–5.81)
RDW: 26.5 % — ABNORMAL HIGH (ref 11.5–15.5)
WBC Count: 3.6 10*3/uL — ABNORMAL LOW (ref 4.0–10.5)
nRBC: 0.6 % — ABNORMAL HIGH (ref 0.0–0.2)

## 2020-07-05 LAB — PREPARE RBC (CROSSMATCH)

## 2020-07-05 MED ORDER — DARBEPOETIN ALFA 500 MCG/ML IJ SOSY
500.0000 ug | PREFILLED_SYRINGE | Freq: Once | INTRAMUSCULAR | Status: AC
  Administered 2020-07-05: 500 ug via SUBCUTANEOUS

## 2020-07-05 MED ORDER — DARBEPOETIN ALFA 500 MCG/ML IJ SOSY
PREFILLED_SYRINGE | INTRAMUSCULAR | Status: AC
  Filled 2020-07-05: qty 1

## 2020-07-05 NOTE — Progress Notes (Signed)
CRITICAL VALUE STICKER  CRITICAL VALUE: Hgb 6.2   RECEIVER (on-site recipient of call):Illyria Sobocinski, RN  DATE & TIME NOTIFIED: 07/05/20 @ [place] (representative from lab): Hillary   MD NOTIFIED: Dr. Lindi Adie  TIME OF NOTIFICATION: 0830  RESPONSE: will f/u with his nurse

## 2020-07-05 NOTE — Patient Instructions (Signed)
Darbepoetin Alfa injection What is this medicine? DARBEPOETIN ALFA (dar be POE e tin AL fa) helps your body make more red blood cells. It is used to treat anemia caused by chronic kidney failure and chemotherapy. This medicine may be used for other purposes; ask your health care provider or pharmacist if you have questions. COMMON BRAND NAME(S): Aranesp What should I tell my health care provider before I take this medicine? They need to know if you have any of these conditions:  blood clotting disorders or history of blood clots  cancer patient not on chemotherapy  cystic fibrosis  heart disease, such as angina, heart failure, or a history of a heart attack  hemoglobin level of 12 g/dL or greater  high blood pressure  low levels of folate, iron, or vitamin B12  seizures  an unusual or allergic reaction to darbepoetin, erythropoietin, albumin, hamster proteins, latex, other medicines, foods, dyes, or preservatives  pregnant or trying to get pregnant  breast-feeding How should I use this medicine? This medicine is for injection into a vein or under the skin. It is usually given by a health care professional in a hospital or clinic setting. If you get this medicine at home, you will be taught how to prepare and give this medicine. Use exactly as directed. Take your medicine at regular intervals. Do not take your medicine more often than directed. It is important that you put your used needles and syringes in a special sharps container. Do not put them in a trash can. If you do not have a sharps container, call your pharmacist or healthcare provider to get one. A special MedGuide will be given to you by the pharmacist with each prescription and refill. Be sure to read this information carefully each time. Talk to your pediatrician regarding the use of this medicine in children. While this medicine may be used in children as young as 1 month of age for selected conditions, precautions do  apply. Overdosage: If you think you have taken too much of this medicine contact a poison control center or emergency room at once. NOTE: This medicine is only for you. Do not share this medicine with others. What if I miss a dose? If you miss a dose, take it as soon as you can. If it is almost time for your next dose, take only that dose. Do not take double or extra doses. What may interact with this medicine? Do not take this medicine with any of the following medications:  epoetin alfa This list may not describe all possible interactions. Give your health care provider a list of all the medicines, herbs, non-prescription drugs, or dietary supplements you use. Also tell them if you smoke, drink alcohol, or use illegal drugs. Some items may interact with your medicine. What should I watch for while using this medicine? Your condition will be monitored carefully while you are receiving this medicine. You may need blood work done while you are taking this medicine. This medicine may cause a decrease in vitamin B6. You should make sure that you get enough vitamin B6 while you are taking this medicine. Discuss the foods you eat and the vitamins you take with your health care professional. What side effects may I notice from receiving this medicine? Side effects that you should report to your doctor or health care professional as soon as possible:  allergic reactions like skin rash, itching or hives, swelling of the face, lips, or tongue  breathing problems  changes in   vision  chest pain  confusion, trouble speaking or understanding  feeling faint or lightheaded, falls  high blood pressure  muscle aches or pains  pain, swelling, warmth in the leg  rapid weight gain  severe headaches  sudden numbness or weakness of the face, arm or leg  trouble walking, dizziness, loss of balance or coordination  seizures (convulsions)  swelling of the ankles, feet, hands  unusually weak or  tired Side effects that usually do not require medical attention (report to your doctor or health care professional if they continue or are bothersome):  diarrhea  fever, chills (flu-like symptoms)  headaches  nausea, vomiting  redness, stinging, or swelling at site where injected This list may not describe all possible side effects. Call your doctor for medical advice about side effects. You may report side effects to FDA at 1-800-FDA-1088. Where should I keep my medicine? Keep out of the reach of children. Store in a refrigerator between 2 and 8 degrees C (36 and 46 degrees F). Do not freeze. Do not shake. Throw away any unused portion if using a single-dose vial. Throw away any unused medicine after the expiration date. NOTE: This sheet is a summary. It may not cover all possible information. If you have questions about this medicine, talk to your doctor, pharmacist, or health care provider.  2020 Elsevier/Gold Standard (2017-09-22 16:44:20)  

## 2020-07-06 ENCOUNTER — Inpatient Hospital Stay: Payer: Medicare Other

## 2020-07-06 ENCOUNTER — Other Ambulatory Visit: Payer: Self-pay

## 2020-07-06 DIAGNOSIS — D4620 Refractory anemia with excess of blasts, unspecified: Secondary | ICD-10-CM

## 2020-07-06 MED ORDER — DIPHENHYDRAMINE HCL 25 MG PO CAPS
ORAL_CAPSULE | ORAL | Status: AC
  Filled 2020-07-06: qty 1

## 2020-07-06 MED ORDER — DIPHENHYDRAMINE HCL 25 MG PO CAPS
25.0000 mg | ORAL_CAPSULE | Freq: Once | ORAL | Status: AC
  Administered 2020-07-06: 25 mg via ORAL

## 2020-07-06 MED ORDER — SODIUM CHLORIDE 0.9% IV SOLUTION
250.0000 mL | Freq: Once | INTRAVENOUS | Status: AC
  Administered 2020-07-06: 250 mL via INTRAVENOUS
  Filled 2020-07-06: qty 250

## 2020-07-06 MED ORDER — ACETAMINOPHEN 325 MG PO TABS
ORAL_TABLET | ORAL | Status: AC
  Filled 2020-07-06: qty 2

## 2020-07-06 MED ORDER — ACETAMINOPHEN 325 MG PO TABS
650.0000 mg | ORAL_TABLET | Freq: Once | ORAL | Status: AC
  Administered 2020-07-06: 650 mg via ORAL

## 2020-07-06 NOTE — Patient Instructions (Signed)

## 2020-07-07 LAB — TYPE AND SCREEN
ABO/RH(D): B POS
Antibody Screen: POSITIVE
DAT, IgG: POSITIVE
DAT, complement: NEGATIVE
Donor AG Type: NEGATIVE
Donor AG Type: NEGATIVE
Unit division: 0
Unit division: 0

## 2020-07-07 LAB — BPAM RBC
Blood Product Expiration Date: 202111022359
Blood Product Expiration Date: 202111222359
ISSUE DATE / TIME: 202110160926
ISSUE DATE / TIME: 202110160926
Unit Type and Rh: 5100
Unit Type and Rh: 7300

## 2020-07-21 ENCOUNTER — Encounter: Payer: Self-pay | Admitting: Hematology and Oncology

## 2020-07-23 ENCOUNTER — Other Ambulatory Visit: Payer: Medicare Other | Admitting: Hospice

## 2020-07-23 ENCOUNTER — Other Ambulatory Visit: Payer: Self-pay

## 2020-07-23 DIAGNOSIS — Z515 Encounter for palliative care: Secondary | ICD-10-CM

## 2020-07-23 DIAGNOSIS — D4620 Refractory anemia with excess of blasts, unspecified: Secondary | ICD-10-CM

## 2020-07-23 NOTE — Progress Notes (Signed)
[facility] Consult Note Telephone: [phone]  Fax: [phone]  PATIENT NAME: Ricky [name] [address] [place] [address] [phone] (home)  DOB: 1931/05/19 MRN: [id number]  PRIMARY CARE PROVIDER:    Roetta Sessions, NP [address] [place],  [place] [address]  REFERRING PROVIDER: Roetta Sessions, NP Roetta Sessions, NP [address] [place],   [address]  RESPONSIBLE PARTY:  Ambrose Wile [phone]  Extended Emergency Contact Information Primary Emergency Contact: Shipton,Colonel Home Phone: [phone] Mobile Phone: [phone] Relation: Son  Patient: [phone] best number to call Spouse: Emeterio Reeve   I met face to face with patient and family in home/facility.  RECOMMENDATIONS/PLAN:    Visit at the request of Roetta Sessions, NP  for palliative consult. Visit consisted of building trust and discussions on Palliative Medicine as specialized medical care for people living with serious illness, aimed at facilitating better quality of life through symptoms relief, assisting with advance care plan and establishing goals of care.  Patient, son-Mak, spouse were present during visit.  Advance Care Planning: Our advance care planning conversation included a discussion about:    The value and importance of advance care planning  Experiences with loved ones who have been seriously ill or have died  Exploration of personal, cultural or spiritual beliefs that might influence medical decisions  Exploration of goals of care in the event of a sudden injury or illness  Identification and preparation of a healthcare agent  Review and updating or creation of an  advance directive document  CODE STATUS: CODE STATUS reviewed today.  Patient is a DO NOT RESUSCITATE.  NP signed DNR form for patient; same document uploaded to epic today.  GOALS OF  CARE: Goals of care include to maximize quality of life and symptom management.  Clarification of goals of care with extensive discussion on medical orders for scope of treatment form.  MOST selections include limited additional intervention, IV fluids as indicated, antibiotics as indicated, no feeding tube. Signed MOST form at home with patient/uploaded to Epic today  Follow up Palliative Care Visit: Palliative care will continue to follow for goals of care clarification and symptom management.  Follow-up in a month/as needed.  Functional decline/Symptom Management: Fatigue related to anemia of chronic disease.  Patient gets blood transfusion as needed.  Followed by Dr. Lindi Adie for low-grade myelodysplastic syndrome.  PT OT is ongoing.  Encouraged balance of rest and performance activity. Patient complained of not sleeping through the night, often getting up to use the bathroom.  Recommended routine bedtime hygiene,  OTC melatonin 3 mg at night; Lasix to be taking earlier in the day.  Patient denies pain/discomfort, no palpitations, no bleeding, no syncope, no shortness of breath.  Patient in no medical acuity. Palliative will continue to monitor for symptom management/decline and make recommendations as needed. Constipation: Encouraged 17g MiraLAX mixed in a cup of water/liquid of choice daily and back off with diarrhea Family /Caregiver/Community Supports: Patient lives at home with his spouse. His son Ashkan coordinates his care/medications. Strong family support system identified. [facility] checks in on patient.   I spent one hour and 46 minutes minutes providing this initial consultation; time includes time spent with patient/family, chart review, provider coordination,  and documentation. More than 50% of the time in this consultation was spent on coordinating communication  CHIEF COMPLAIN/HISTORY OF PRESENT ILLNESS:  Ricky [name] is a  86 y.o. male with multiple medical problems including  low-grade myelodysplastic syndrome, anemia of chronic disease, paroxysmal A. fib.  Pacemaker present.  Palliative Care was asked to follow this patient by consultation request of full importance of quitting Clide Deutscher, NP  to help address advance care planning and goals of care.  CODE STATUS:DNR  PPS: 50%  HOSPICE ELIGIBILITY/DIAGNOSIS: TBD  PAST MEDICAL HISTORY:  Past Medical History:  Diagnosis Date  . A-fib ([facility])   . Anemia   . Anemia of chronic disease 02/02/2020  . Atrial fibrillation ([facility])   . Boerhaave's syndrome   . Chronic anticoagulation 01/22/2020  . Colon polyps   . COVID-19   . Hx of colonic polyps 01/22/2020   2017 - [place]  . Hypomagnesemia   . Low grade myelodysplastic syndrome lesions ([facility]) 02/02/2020  . Lower urinary tract symptoms due to benign prostatic hyperplasia   . Pacemaker   . Paroxysmal atrial fibrillation ([facility])   . Prostate enlargement   . Symptomatic anemia 12/26/2019  . Vitamin D deficiency     SOCIAL HX:  Social History   Tobacco Use  . Smoking status: Former Smoker    Types: Cigarettes    Quit date: 01/21/1970    Years since quitting: 50.5  . Smokeless tobacco: Never Used  Substance Use Topics  . Alcohol use: Not Currently   FAMILY HX:  Family History  Problem Relation Age of Onset  . Cirrhosis Mother   . Alcoholism Mother   . Heart attack Father 67  . Heart disease Brother   . Diabetes Brother   . Brain cancer Daughter     ALLERGIES: No Known Allergies   PERTINENT MEDICATIONS:  Outpatient Encounter Medications as of 07/23/2020  Medication Sig  . apixaban (ELIQUIS) 5 MG TABS tablet Take 1 tablet (5 mg total) by mouth 2 (two) times daily.  . Azelastine HCl 0.15 % SOLN Place 1 spray into the nose as needed.  . cholecalciferol (VITAMIN D3) 25 MCG (1000 UNIT) tablet Take 1,000 Units by mouth daily.  . cyanocobalamin 1000 MCG tablet Take 1,000 mcg by mouth daily.  Marland Kitchen desonide (DESOWEN) 0.05 % cream Apply 1 application topically 2 (two)  times daily.  . fluorouracil (EFUDEX) 5 % cream Apply 1 application topically 2 (two) times daily.  . furosemide (LASIX) 20 MG tablet May take 20 -40 mg Daily  . Magnesium 250 MG TABS Take 1 tablet (250 mg total) by mouth daily.  . mupirocin ointment (BACTROBAN) 2 % Apply 1 application topically as directed.  . simvastatin (ZOCOR) 20 MG tablet Take 20 mg by mouth daily.   No facility-administered encounter medications on file as of 07/23/2020.    PHYSICAL EXAM / ROS: Height:   5 feet 11 inches    Weight: 195 Lbs General: In no acute distress, cooperative Cardiovascular: regular rate and rhythm, no chest pain or palpitations reported Pulmonary: no cough, no shortness of breath, clear ant/post fields, normal respiratory effort Abdomen: soft, non tender, positive bowel sounds in all quadrants GU: denies dysuria, no suprapubic tenderness Extremities: no edema, no joint deformities Neurological: Weakness but otherwise non focal  Teodoro Spray, NP

## 2020-07-25 NOTE — Progress Notes (Signed)
Patient Care Team: Patient, No Pcp Per as PCP - General (General Practice) Nahser, Wonda Cheng, MD as PCP - Cardiology (Cardiology)  DIAGNOSIS:    ICD-10-CM   1. Low grade myelodysplastic syndrome lesions ([facility])  D46.20 CBC with Differential ([facility] Only)    Ferritin    Sample to Blood Bank    Iron and TIBC    CMP ([facility] only)    CHIEF COMPLIANT: Follow-up of MDS  INTERVAL HISTORY: Ricky [name] is a 86 y.o. with above-mentioned history of MDS currently receivingAranesp (last 07/05/20) and he received 2 unites of PRBCs on 07/06/20. He presents to the clinic todayfor follow-up.He is here accompanied by his son.  He reports to be feeling fatigued and lightheaded and short of breath.  ALLERGIES:  has No Known Allergies.  MEDICATIONS:  Current Outpatient Medications  Medication Sig Dispense Refill  . apixaban (ELIQUIS) 5 MG TABS tablet Take 1 tablet (5 mg total) by mouth 2 (two) times daily. 180 tablet 1  . Azelastine HCl 0.15 % SOLN Place 1 spray into the nose as needed.    . cholecalciferol (VITAMIN D3) 25 MCG (1000 UNIT) tablet Take 1,000 Units by mouth daily.    . cyanocobalamin 1000 MCG tablet Take 1,000 mcg by mouth daily.    Marland Kitchen deferasirox (EXJADE) 500 MG disintegrating tablet Take 2 tablets (1,000 mg total) by mouth daily before breakfast. 60 tablet 3  . desonide (DESOWEN) 0.05 % cream Apply 1 application topically 2 (two) times daily.    . fluorouracil (EFUDEX) 5 % cream Apply 1 application topically 2 (two) times daily.    . furosemide (LASIX) 20 MG tablet May take 20 -40 mg Daily 120 tablet 3  . Magnesium 250 MG TABS Take 1 tablet (250 mg total) by mouth daily.  0  . mupirocin ointment (BACTROBAN) 2 % Apply 1 application topically as directed.    . simvastatin (ZOCOR) 20 MG tablet Take 20 mg by mouth daily.     No current facility-administered medications for this visit.    PHYSICAL EXAMINATION: ECOG PERFORMANCE STATUS: 1 - Symptomatic but  completely ambulatory  Vitals:   07/26/20 0933  BP: (!) 119/57  Pulse: 83  Resp: 17  Temp: 97.6 F (36.4 C)  SpO2: 98%   Filed Weights   07/26/20 0933  Weight: 193 lb 1.6 oz (87.6 kg)    LABORATORY DATA:  I have reviewed the data as listed CMP Latest Ref Rng & Units 06/03/2020 05/29/2020 03/28/2020  Glucose 65 - 99 mg/dL 108(H) 91 99  BUN 8 - 27 mg/dL 29(H) 24 19  Creatinine 0.76 - 1.27 mg/dL 1.21 1.18 1.09  Sodium 134 - 144 mmol/L 137 137 138  Potassium 3.5 - 5.2 mmol/L 4.4 4.5 4.7  Chloride 96 - 106 mmol/L 102 102 105  CO2 20 - 29 mmol/L $RemoveB'24 25 25  'tOyoESgg$ Calcium 8.6 - 10.2 mg/dL 9.0 8.9 8.9  Total Protein 6.5 - 8.1 g/dL - - 7.5  Total Bilirubin 0.3 - 1.2 mg/dL - - 1.1  Alkaline Phos 38 - 126 U/L - - 58  AST 15 - 41 U/L - - 13(L)  ALT 0 - 44 U/L - - 18    Lab Results  Component Value Date   WBC 3.7 (L) 07/26/2020   HGB 7.3 (L) 07/26/2020   HCT 22.6 (L) 07/26/2020   MCV 93.0 07/26/2020   PLT 148 (L) 07/26/2020   NEUTROABS 1.9 07/26/2020    ASSESSMENT & PLAN:  Low grade  myelodysplastic syndrome lesions ([facility]) Normocytic to macrocytic anemia:  Gastroenterology evaluation: Did not feel like he is a candidate for endoscopies or biopsies. Blood transfusion 02/21/2020, 03/23/20,05/03/2020  Treatment plan:Aranesp every 3 weeks 500 mcg (increased from 300 mcg)  Lab review:  01/29/2020: Hemoglobin 7.8, MCV 103.4, platelets 149 03/28/2020: Hemoglobin is 9 (it was 6.9 on 03/22/2020) 04/12/2020: Hemoglobin is7.7 05/03/2020: Hemoglobin 6.5 05/23/2020: Hemoglobin 7.1 (received blood transfusion) 06/14/2020: Hemoglobin 7.8 07/05/2020: Hemoglobin 6.2 (received blood transfusion) 07/26/2020: Hemoglobin 7.3 (received blood transfusion)  Bone marrow biopsy: Hypercellular bone marrow for age 38 to 60% cellularity with dyspoietic changes associated with abundant ring sideroblasts. No increase in blastic cells identified. 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  We will start at 1000 mg a day and may increase up to 2000 mg if needed.  We will monitor ferritin levels with each transfusion.   Return to clinic every 2 weeks for labs and follow-ups.   Orders Placed This Encounter  Procedures  . CBC with Differential ([facility] Only)    Standing Status:   Future    Standing Expiration Date:   07/26/2021  . Ferritin    Standing Status:   Future    Standing Expiration Date:   07/26/2021  . Iron and TIBC    Standing Status:   Future    Standing Expiration Date:   07/26/2021  . CMP ([facility] only)    Standing Status:   Future    Standing Expiration Date:   07/26/2021  . Sample to Blood Bank    Standing Status:   Future    Standing Expiration Date:   07/26/2021   The patient has a good understanding of the overall plan. he agrees with it. he will call with any problems that may develop before the next visit here.  Total time spent: 30 mins including face to face time and time spent for planning, charting and coordination of care  Nicholas Lose, MD 07/26/2020  I, Cloyde Reams Dorshimer, am acting as scribe for Dr. Nicholas Lose.  I have reviewed the above documentation for accuracy and completeness, and I agree with the above.

## 2020-07-26 ENCOUNTER — Other Ambulatory Visit: Payer: Self-pay

## 2020-07-26 ENCOUNTER — Inpatient Hospital Stay: Payer: Medicare Other

## 2020-07-26 ENCOUNTER — Inpatient Hospital Stay: Payer: Medicare Other | Attending: Hematology and Oncology

## 2020-07-26 ENCOUNTER — Other Ambulatory Visit: Payer: Self-pay | Admitting: *Deleted

## 2020-07-26 ENCOUNTER — Telehealth: Payer: Self-pay | Admitting: Pharmacist

## 2020-07-26 ENCOUNTER — Inpatient Hospital Stay: Payer: Medicare Other | Admitting: Hematology and Oncology

## 2020-07-26 DIAGNOSIS — D4620 Refractory anemia with excess of blasts, unspecified: Secondary | ICD-10-CM

## 2020-07-26 DIAGNOSIS — D638 Anemia in other chronic diseases classified elsewhere: Secondary | ICD-10-CM

## 2020-07-26 LAB — PREPARE RBC (CROSSMATCH)

## 2020-07-26 LAB — CBC WITH DIFFERENTIAL (CANCER CENTER ONLY)
Abs Immature Granulocytes: 0.02 10*3/uL (ref 0.00–0.07)
Basophils Absolute: 0 10*3/uL (ref 0.0–0.1)
Basophils Relative: 0 %
Eosinophils Absolute: 0.2 10*3/uL (ref 0.0–0.5)
Eosinophils Relative: 4 %
HCT: 22.6 % — ABNORMAL LOW (ref 39.0–52.0)
Hemoglobin: 7.3 g/dL — ABNORMAL LOW (ref 13.0–17.0)
Immature Granulocytes: 1 %
Lymphocytes Relative: 21 %
Lymphs Abs: 0.8 10*3/uL (ref 0.7–4.0)
MCH: 30 pg (ref 26.0–34.0)
MCHC: 32.3 g/dL (ref 30.0–36.0)
MCV: 93 fL (ref 80.0–100.0)
Monocytes Absolute: 0.9 10*3/uL (ref 0.1–1.0)
Monocytes Relative: 24 %
Neutro Abs: 1.9 10*3/uL (ref 1.7–7.7)
Neutrophils Relative %: 50 %
Platelet Count: 148 10*3/uL — ABNORMAL LOW (ref 150–400)
RBC: 2.43 MIL/uL — ABNORMAL LOW (ref 4.22–5.81)
RDW: 24.8 % — ABNORMAL HIGH (ref 11.5–15.5)
WBC Count: 3.7 10*3/uL — ABNORMAL LOW (ref 4.0–10.5)
nRBC: 0.5 % — ABNORMAL HIGH (ref 0.0–0.2)

## 2020-07-26 MED ORDER — DARBEPOETIN ALFA 500 MCG/ML IJ SOSY
PREFILLED_SYRINGE | INTRAMUSCULAR | Status: AC
  Filled 2020-07-26: qty 1

## 2020-07-26 MED ORDER — DEFERASIROX 500 MG PO TBSO: 1000.0000 mg | ORAL_TABLET | Freq: Every day | ORAL | 3 refills | Status: DC

## 2020-07-26 MED ORDER — DARBEPOETIN ALFA 500 MCG/ML IJ SOSY
500.0000 ug | PREFILLED_SYRINGE | Freq: Once | INTRAMUSCULAR | Status: AC
  Administered 2020-07-26: 500 ug via SUBCUTANEOUS

## 2020-07-26 NOTE — Telephone Encounter (Signed)
Oral Oncology Pharmacist Encounter  Received notification from [facility] that prior authorization for Exjade is required.  PA submitted on CoverMyMeds Key: [id number]  Status is pending  Oral Oncology Clinic will continue to follow  Leron Croak, PharmD, BCPS Hematology/Oncology Clinical Pharmacist [facility] [phone] 07/26/2020 2:07 PM

## 2020-07-26 NOTE — Progress Notes (Signed)
CRITICAL VALUE ALERT  Critical Value:  Hgb 7.3  Date & Time Notied:  07/26/20 at 0940  Provider Notified: Nicholas Lose, MD  Orders Received/Actions taken: MD notified and orders received for pt to receive 2 units PRBC's tomorrow.  Orders placed and pt notified of apt date and time.

## 2020-07-26 NOTE — Patient Instructions (Signed)
Darbepoetin Alfa injection What is this medicine? DARBEPOETIN ALFA (dar be POE e tin AL fa) helps your body make more red blood cells. It is used to treat anemia caused by chronic kidney failure and chemotherapy. This medicine may be used for other purposes; ask your health care provider or pharmacist if you have questions. COMMON BRAND NAME(S): Aranesp What should I tell my health care provider before I take this medicine? They need to know if you have any of these conditions:  blood clotting disorders or history of blood clots  cancer patient not on chemotherapy  cystic fibrosis  heart disease, such as angina, heart failure, or a history of a heart attack  hemoglobin level of 12 g/dL or greater  high blood pressure  low levels of folate, iron, or vitamin B12  seizures  an unusual or allergic reaction to darbepoetin, erythropoietin, albumin, hamster proteins, latex, other medicines, foods, dyes, or preservatives  pregnant or trying to get pregnant  breast-feeding How should I use this medicine? This medicine is for injection into a vein or under the skin. It is usually given by a health care professional in a hospital or clinic setting. If you get this medicine at home, you will be taught how to prepare and give this medicine. Use exactly as directed. Take your medicine at regular intervals. Do not take your medicine more often than directed. It is important that you put your used needles and syringes in a special sharps container. Do not put them in a trash can. If you do not have a sharps container, call your pharmacist or healthcare provider to get one. A special MedGuide will be given to you by the pharmacist with each prescription and refill. Be sure to read this information carefully each time. Talk to your pediatrician regarding the use of this medicine in children. While this medicine may be used in children as young as 1 month of age for selected conditions, precautions do  apply. Overdosage: If you think you have taken too much of this medicine contact a poison control center or emergency room at once. NOTE: This medicine is only for you. Do not share this medicine with others. What if I miss a dose? If you miss a dose, take it as soon as you can. If it is almost time for your next dose, take only that dose. Do not take double or extra doses. What may interact with this medicine? Do not take this medicine with any of the following medications:  epoetin alfa This list may not describe all possible interactions. Give your health care provider a list of all the medicines, herbs, non-prescription drugs, or dietary supplements you use. Also tell them if you smoke, drink alcohol, or use illegal drugs. Some items may interact with your medicine. What should I watch for while using this medicine? Your condition will be monitored carefully while you are receiving this medicine. You may need blood work done while you are taking this medicine. This medicine may cause a decrease in vitamin B6. You should make sure that you get enough vitamin B6 while you are taking this medicine. Discuss the foods you eat and the vitamins you take with your health care professional. What side effects may I notice from receiving this medicine? Side effects that you should report to your doctor or health care professional as soon as possible:  allergic reactions like skin rash, itching or hives, swelling of the face, lips, or tongue  breathing problems  changes in   vision  chest pain  confusion, trouble speaking or understanding  feeling faint or lightheaded, falls  high blood pressure  muscle aches or pains  pain, swelling, warmth in the leg  rapid weight gain  severe headaches  sudden numbness or weakness of the face, arm or leg  trouble walking, dizziness, loss of balance or coordination  seizures (convulsions)  swelling of the ankles, feet, hands  unusually weak or  tired Side effects that usually do not require medical attention (report to your doctor or health care professional if they continue or are bothersome):  diarrhea  fever, chills (flu-like symptoms)  headaches  nausea, vomiting  redness, stinging, or swelling at site where injected This list may not describe all possible side effects. Call your doctor for medical advice about side effects. You may report side effects to FDA at 1-800-FDA-1088. Where should I keep my medicine? Keep out of the reach of children. Store in a refrigerator between 2 and 8 degrees C (36 and 46 degrees F). Do not freeze. Do not shake. Throw away any unused portion if using a single-dose vial. Throw away any unused medicine after the expiration date. NOTE: This sheet is a summary. It may not cover all possible information. If you have questions about this medicine, talk to your doctor, pharmacist, or health care provider.  2020 Elsevier/Gold Standard (2017-09-22 16:44:20)  

## 2020-07-26 NOTE — Telephone Encounter (Addendum)
Oral Oncology Pharmacist Encounter  Received new prescription for Exjade (deferasirox) for the treatment of iron overload secondary to blood transfusions, planned duration until ferritin < 500 mcg/L or unacceptable drug toxicity.  Prescription dose and frequency assessed for appropriateness. Patient starting on dose reduction per MD to improve tolerance.   Current dose appropriate given patient's calculated CrCl of ~45.8 mL/min based on Scr of 1.21 mg/dL from 9/13. Package insert recommends patients with CrCl 40-60 mL/min be initiated on a 50% dose reduction of the typical starting dose of 20 mg/kg.   CBC w/ Diff from 07/26/20 assessed, noted patient Hgb of 7.3 g/dL and scheduled to receive blood transfusion on 07/27/20. Ferritin from 06/14/20 was 1,961 ng/mL. As mentioned above most recent Scr available was from 06/03/20. Scr at that time was 1.21 mg/dL (CrCl ~45.28mL/min). MD notified that close follow up of patient's renal function is advised since deferasirox is contraindicated in patient's with CrCl < 40 mL/min.   Current medication list in Epic reviewed, DDIs with Exjade identified:  Category C DDI between Exjade and Eliquis - increased risk of GI bleed/ulcer with concomitant use of both agents. Will counsel patient to monitor for s/sx of bleeding, including but not limited to presence of dark, tarry stools.   Evaluated chart and no patient barriers to medication adherence noted.   Prescription has been e-scribed to the [facility] for benefits analysis and approval.  Oral Oncology Clinic will continue to follow for insurance authorization, copayment issues, initial counseling and start date.  Leron Croak, PharmD, BCPS Hematology/Oncology Clinical Pharmacist [facility] [phone] 07/26/2020 2:05 PM

## 2020-07-26 NOTE — Assessment & Plan Note (Signed)
Normocytic to macrocytic anemia:  Gastroenterology evaluation: Did not feel like he is a candidate for endoscopies or biopsies. Blood transfusion 02/21/2020, 03/23/20,05/03/2020  Treatment plan:Aranesp every 3 weeks 500 mcg (increased from 300 mcg)  Lab review:  01/29/2020: Hemoglobin 7.8, MCV 103.4, platelets 149 03/28/2020: Hemoglobin is 9 (it was 6.9 on 03/22/2020) 04/12/2020: Hemoglobin is7.7 05/03/2020: Hemoglobin 6.5 05/23/2020: Hemoglobin 7.1 (received blood transfusion) 06/14/2020: Hemoglobin 7.8 07/05/2020: Hemoglobin 6.2 (received blood transfusion)  Bone marrow biopsy: Hypercellular bone marrow for age 40 to 60% cellularity with dyspoietic changes associated with abundant ring sideroblasts. No increase in blastic cells identified. 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   

## 2020-07-27 ENCOUNTER — Inpatient Hospital Stay: Payer: Medicare Other

## 2020-07-27 DIAGNOSIS — D4620 Refractory anemia with excess of blasts, unspecified: Secondary | ICD-10-CM | POA: Diagnosis not present

## 2020-07-27 DIAGNOSIS — D638 Anemia in other chronic diseases classified elsewhere: Secondary | ICD-10-CM

## 2020-07-27 MED ORDER — ACETAMINOPHEN 325 MG PO TABS
650.0000 mg | ORAL_TABLET | Freq: Once | ORAL | Status: AC
  Administered 2020-07-27: 650 mg via ORAL

## 2020-07-27 MED ORDER — ACETAMINOPHEN 325 MG PO TABS
ORAL_TABLET | ORAL | Status: AC
  Filled 2020-07-27: qty 2

## 2020-07-27 MED ORDER — DIPHENHYDRAMINE HCL 25 MG PO CAPS
ORAL_CAPSULE | ORAL | Status: AC
  Filled 2020-07-27: qty 1

## 2020-07-27 MED ORDER — DIPHENHYDRAMINE HCL 25 MG PO CAPS
25.0000 mg | ORAL_CAPSULE | Freq: Once | ORAL | Status: AC
  Administered 2020-07-27: 25 mg via ORAL

## 2020-07-27 MED ORDER — SODIUM CHLORIDE 0.9% IV SOLUTION
250.0000 mL | Freq: Once | INTRAVENOUS | Status: AC
  Administered 2020-07-27: 250 mL via INTRAVENOUS
  Filled 2020-07-27: qty 250

## 2020-07-27 NOTE — Patient Instructions (Signed)

## 2020-07-29 ENCOUNTER — Other Ambulatory Visit: Payer: Self-pay | Admitting: *Deleted

## 2020-07-29 DIAGNOSIS — D638 Anemia in other chronic diseases classified elsewhere: Secondary | ICD-10-CM

## 2020-07-29 LAB — TYPE AND SCREEN
ABO/RH(D): B POS
Antibody Screen: POSITIVE
DAT, IgG: POSITIVE
Unit division: 0
Unit division: 0

## 2020-07-29 LAB — BPAM RBC
Blood Product Expiration Date: 202112132359
Blood Product Expiration Date: 202112152359
ISSUE DATE / TIME: 202111060846
ISSUE DATE / TIME: 202111060846
Unit Type and Rh: 5100
Unit Type and Rh: 5100

## 2020-07-29 NOTE — Telephone Encounter (Signed)
Oral Oncology Pharmacist Encounter   Prior Authorization for Exjade (deferasirox) has been approved.     Effective dates: 05/29/2020 through 07/28/2021 Patient copay: $58.20    Oral Oncology Clinic will continue to follow.   Leron Croak, PharmD, BCPS Hematology/Oncology Clinical Pharmacist [facility] [phone] 07/29/2020 10:23 AM

## 2020-07-29 NOTE — Telephone Encounter (Signed)
Oral Oncology Pharmacist Encounter   Prior Authorization for Exjade has been denied.     Additional information requested for clinical review for Exjade. Information faxed to: [phone]    Oral Oncology Clinic will continue to follow.    Leron Croak, PharmD, BCPS Hematology/Oncology Clinical Pharmacist [facility] [phone] 07/29/2020 8:36 AM

## 2020-07-31 MED FILL — DEFERASIROX 500 MG TBSO: 500 | 30 days supply | Qty: 60 | Fill #0

## 2020-07-31 NOTE — Telephone Encounter (Signed)
Oral Chemotherapy Pharmacist Encounter  I spoke with patient's son for overview of: Exjade (deferasirox) for the treatment of chronic iron overload associated with frequent blood transfusions, planned duration until achievement of decreasing ferritin and maintenance of iron burden in target range.   Counseled on administration, dosing, side effects, monitoring, drug-food interactions, safe handling, storage, and disposal.  Patient will take Exjade 500mg  tablets, 2 tablets (1000mg ) once daily on an empty stomach at least 30 minutes before food, preferably at the same time each day.   Patient knows to not chew tablets or swallow them whole. Patient will completely disperse tablets by stirring in water, orange juice, or apple juice until a fine suspension is obtained. Avoid dispersion in milk or carbonated beverages due to dissolution issues Patient will disperse doses of <1 g in 3.5 ounces of liquid and doses of ?1 g in 7.0 ounces of liquid.   After swallowing the suspension, patient will resuspend any residue in a small volume of liquid and swallow.  Patient knows to separate Exjade administration for aluminum antacids.  Exjade start date: 08/02/20  Adverse effects include but are not limited to: nausea, vomiting, diarrhea, rash, hearing or sight disturbances, renal failure, decreased blood counts, and hypersensitivity reactions.    Reviewed importance of keeping a medication schedule and plan for any missed doses. No barriers to medication adherence identified.  Medication reconciliation performed and medication/allergy list updated. Discussed drug-drug interaction between Exjade and Eliquis and importance of monitoring for signs/symptoms of GI bleeding including, but not limited to, dark tarry stools.  Insurance authorization for Exjade has been obtained. Test claim at the pharmacy revealed copayment $58.20 for 1st fill of Exjade. This will ship from the [facility] on  07/31/20 to deliver to patient's home on 08/01/20.  Son informed the pharmacy will reach out 5-7 days prior to needing next fill of Exjade to coordinate continued medication acquisition to prevent break in therapy.  All questions answered.  Mr. Marnette Burgess voiced understanding and appreciation.   Patient's son knows to call the office with questions or concerns. Oral Chemotherapy Clinic phone number provided.   Leron Croak, PharmD, BCPS Hematology/Oncology Clinical Pharmacist [facility] [phone] 07/31/2020 1:34 PM

## 2020-08-01 ENCOUNTER — Telehealth: Payer: Self-pay | Admitting: *Deleted

## 2020-08-01 NOTE — Telephone Encounter (Signed)
Patient with diagnosis of afib on Eliquis for anticoagulation.    Procedure: APICOECTOMY OF 1 FRONT TOOTH Date of procedure: TBD    CHA2DS2-VASc Score = 3  This indicates a 3.2% annual risk of stroke. The patient's score is based upon: CHF History: 0 HTN History: 0 Diabetes History: 0 Stroke History: 0 Vascular Disease History: 1 Age Score: 2 Gender Score: 0     CrCl 45.8 ml/min  Per office protocol, patient can hold Eliquis for 2 days prior to procedure.

## 2020-08-01 NOTE — Telephone Encounter (Signed)
   Primary Cardiologist: Mertie Moores, MD  Chart reviewed as part of pre-operative protocol coverage.   Per pharmacy recommendations, patient can hold eliquis 2 days prior to his upcoming dental procedure and should restart as soon as he is cleared to do so by DR. Beavers.  I will route this recommendation to the requesting party via Epic fax function and remove from pre-op pool.  Please call with questions.  Abigail Butts, PA-C 08/01/2020, 3:03 PM

## 2020-08-01 NOTE — Telephone Encounter (Signed)
   [facility] Pre-operative Risk Assessment    HEARTCARE STAFF: - Please ensure there is not already an duplicate clearance open for this procedure. - Under Visit Info/Reason for Call, type in Other and utilize the format Clearance MM/DD/YY or Clearance TBD. Do not use dashes or single digits. - If request is for dental extraction, please clarify the # of teeth to be extracted.  Request for surgical clearance:  What type of surgery is being performed? APICOECTOMY OF 1 FRONT TOOTH 1. When is this surgery scheduled? TBD  2. What type of clearance is required (medical clearance vs. Pharmacy clearance to hold med vs. Both)? PHARM  3. Are there any medications that need to be held prior to surgery and how long? ELIQUIS REQUESTED TO BE HELD 2-3 DAYS PRIOR TO PROCEDURE   4. Practice name and name of physician performing surgery? DR. Tarri Glenn, DDS   5. What is the office phone number? [phone]   7.   What is the office fax number? [phone]  8.   Anesthesia type (None, local, MAC, general) ? LOCAL   Julaine Hua 08/01/2020, 10:14 AM  _________________________________________________________________   (provider comments below)

## 2020-08-08 NOTE — Progress Notes (Signed)
Patient Care Team: Patient, No Pcp Per as PCP - General (General Practice) Nahser, Wonda Cheng, MD as PCP - Cardiology (Cardiology)  DIAGNOSIS:    ICD-10-CM   1. Low grade myelodysplastic syndrome lesions ([place])  D46.20     CHIEF COMPLIANT: Follow-upof MDS  INTERVAL HISTORY: ALDEAN PIPE is a 86 y.o. with above-mentioned history of MDScurrently receivingAranesp (last 07/26/20) and he received 2 unites of PRBCs on 07/27/20. He presents to the clinic todayfor follow-up. He continues to be extremely fatigued and generally weak.  He is able to walk with the help of a walker.  He resides in the nursing home.  He was started on Exjade and he appears to be tolerating it fairly well.  We are monitoring his liver function and kidney function.  ALLERGIES:  has No Known Allergies.  MEDICATIONS:  Current Outpatient Medications  Medication Sig Dispense Refill  . apixaban (ELIQUIS) 5 MG TABS tablet Take 1 tablet (5 mg total) by mouth 2 (two) times daily. 180 tablet 1  . Azelastine HCl 0.15 % SOLN Place 1 spray into the nose as needed.    . cholecalciferol (VITAMIN D3) 25 MCG (1000 UNIT) tablet Take 1,000 Units by mouth daily.    . cyanocobalamin 1000 MCG tablet Take 1,000 mcg by mouth daily.    Marland Kitchen deferasirox (EXJADE) 500 MG disintegrating tablet Take 2 tablets (1,000 mg total) by mouth daily before breakfast. 60 tablet 3  . desonide (DESOWEN) 0.05 % cream Apply 1 application topically 2 (two) times daily.    . fluorouracil (EFUDEX) 5 % cream Apply 1 application topically 2 (two) times daily.    . furosemide (LASIX) 20 MG tablet May take 20 -40 mg Daily 120 tablet 3  . Magnesium 250 MG TABS Take 1 tablet (250 mg total) by mouth daily.  0  . mupirocin ointment (BACTROBAN) 2 % Apply 1 application topically as directed.    . simvastatin (ZOCOR) 20 MG tablet Take 20 mg by mouth daily.     No current facility-administered medications for this visit.    PHYSICAL EXAMINATION: ECOG PERFORMANCE  STATUS: 3 - Symptomatic, >50% confined to bed  Vitals:   08/09/20 0927  BP: (!) 125/54  Pulse: 73  Resp: 17  Temp: (!) 97.4 F (36.3 C)  SpO2: 97%   Filed Weights   08/09/20 0927  Weight: 194 lb (88 kg)     LABORATORY DATA:  I have reviewed the data as listed CMP Latest Ref Rng & Units 08/09/2020 06/03/2020 05/29/2020  Glucose 70 - 99 mg/dL 104(H) 108(H) 91  BUN 8 - 23 mg/dL 24(H) 29(H) 24  Creatinine 0.61 - 1.24 mg/dL 1.25(H) 1.21 1.18  Sodium 135 - 145 mmol/L 137 137 137  Potassium 3.5 - 5.1 mmol/L 4.4 4.4 4.5  Chloride 98 - 111 mmol/L 104 102 102  CO2 22 - 32 mmol/L $RemoveB'25 24 25  'XfPzanXH$ Calcium 8.9 - 10.3 mg/dL 8.5(L) 9.0 8.9  Total Protein 6.5 - 8.1 g/dL 7.9 - -  Total Bilirubin 0.3 - 1.2 mg/dL 1.2 - -  Alkaline Phos 38 - 126 U/L 61 - -  AST 15 - 41 U/L 20 - -  ALT 0 - 44 U/L 34 - -    Lab Results  Component Value Date   WBC 4.8 08/09/2020   HGB 8.4 (L) 08/09/2020   HCT 26.3 (L) 08/09/2020   MCV 94.9 08/09/2020   PLT 182 08/09/2020   NEUTROABS 2.6 08/09/2020    ASSESSMENT & PLAN:  Low grade myelodysplastic syndrome lesions (HCC) Normocytic to macrocytic anemia:  Gastroenterology evaluation: Did not feel like he is a candidate for endoscopies or biopsies. Blood transfusion 02/21/2020, 03/23/20,05/03/2020  Treatment plan:Aranesp every 3 weeks 500 mcg (increased from 300 mcg)  Lab review:  01/29/2020: Hemoglobin 7.8, MCV 103.4, platelets 149 03/28/2020: Hemoglobin is 9 (it was 6.9 on 03/22/2020) 04/12/2020: Hemoglobin is7.7 05/03/2020: Hemoglobin 6.5 05/23/2020: Hemoglobin 7.1(received blood transfusion) 06/14/2020: Hemoglobin 7.8 07/05/2020: Hemoglobin 6.2 (received blood transfusion) 07/26/2020: Hemoglobin 7.3 (received blood transfusion) 08/09/2020: Hemoglobin 8.4  Bone marrow biopsy: Hypercellular bone marrow for age 70 to 60% cellularity with dyspoietic changes associated with abundant ring sideroblasts. No increase in blastic cells identified. Findings favor MDS  with ringed sideroblasts Cytogenetics and FISH: SF3B1 mutation detected (present in 80% of MDS with ringed sideroblasts) no other mutations  Treatment plan: Aranesp and blood transfusions and supportive care Elevated ferritin: Iron overload from frequent blood transfusions: on Exjade.  We will start at 1000 mg a day and may increase up to 2000 mg if needed.  We will monitor ferritin levels with each transfusion.   Return to clinic every 2 weeks for labs and follow-ups.  He will come for injection in 2 weeks    No orders of the defined types were placed in this encounter.  The patient has a good understanding of the overall plan. he agrees with it. he will call with any problems that may develop before the next visit here.  Total time spent: 30 mins including face to face time and time spent for planning, charting and coordination of care  Nicholas Lose, MD 08/09/2020  I, Cloyde Reams Dorshimer, am acting as scribe for Dr. Nicholas Lose.  I have reviewed the above documentation for accuracy and completeness, and I agree with the above.

## 2020-08-09 ENCOUNTER — Other Ambulatory Visit: Payer: Self-pay

## 2020-08-09 ENCOUNTER — Inpatient Hospital Stay: Payer: Medicare Other | Admitting: Hematology and Oncology

## 2020-08-09 ENCOUNTER — Inpatient Hospital Stay: Payer: Medicare Other

## 2020-08-09 DIAGNOSIS — D4620 Refractory anemia with excess of blasts, unspecified: Secondary | ICD-10-CM

## 2020-08-09 DIAGNOSIS — D638 Anemia in other chronic diseases classified elsewhere: Secondary | ICD-10-CM

## 2020-08-09 LAB — CBC WITH DIFFERENTIAL (CANCER CENTER ONLY)
Abs Immature Granulocytes: 0.02 10*3/uL (ref 0.00–0.07)
Basophils Absolute: 0 10*3/uL (ref 0.0–0.1)
Basophils Relative: 1 %
Eosinophils Absolute: 0.2 10*3/uL (ref 0.0–0.5)
Eosinophils Relative: 4 %
HCT: 26.3 % — ABNORMAL LOW (ref 39.0–52.0)
Hemoglobin: 8.4 g/dL — ABNORMAL LOW (ref 13.0–17.0)
Immature Granulocytes: 0 %
Lymphocytes Relative: 23 %
Lymphs Abs: 1.1 10*3/uL (ref 0.7–4.0)
MCH: 30.3 pg (ref 26.0–34.0)
MCHC: 31.9 g/dL (ref 30.0–36.0)
MCV: 94.9 fL (ref 80.0–100.0)
Monocytes Absolute: 0.9 10*3/uL (ref 0.1–1.0)
Monocytes Relative: 18 %
Neutro Abs: 2.6 10*3/uL (ref 1.7–7.7)
Neutrophils Relative %: 54 %
Platelet Count: 182 10*3/uL (ref 150–400)
RBC: 2.77 MIL/uL — ABNORMAL LOW (ref 4.22–5.81)
RDW: 21.9 % — ABNORMAL HIGH (ref 11.5–15.5)
WBC Count: 4.8 10*3/uL (ref 4.0–10.5)
nRBC: 0.4 % — ABNORMAL HIGH (ref 0.0–0.2)

## 2020-08-09 LAB — CMP (CANCER CENTER ONLY)
ALT: 34 U/L (ref 0–44)
AST: 20 U/L (ref 15–41)
Albumin: 3.9 g/dL (ref 3.5–5.0)
Alkaline Phosphatase: 61 U/L (ref 38–126)
Anion gap: 8 (ref 5–15)
BUN: 24 mg/dL — ABNORMAL HIGH (ref 8–23)
CO2: 25 mmol/L (ref 22–32)
Calcium: 8.5 mg/dL — ABNORMAL LOW (ref 8.9–10.3)
Chloride: 104 mmol/L (ref 98–111)
Creatinine: 1.25 mg/dL — ABNORMAL HIGH (ref 0.61–1.24)
GFR, Estimated: 56 mL/min — ABNORMAL LOW (ref >60)
Glucose, Bld: 104 mg/dL — ABNORMAL HIGH (ref 70–99)
Potassium: 4.4 mmol/L (ref 3.5–5.1)
Sodium: 137 mmol/L (ref 135–145)
Total Bilirubin: 1.2 mg/dL (ref 0.3–1.2)
Total Protein: 7.9 g/dL (ref 6.5–8.1)

## 2020-08-09 LAB — FERRITIN: Ferritin: 2314 ng/mL — ABNORMAL HIGH (ref 24–336)

## 2020-08-09 LAB — IRON AND TIBC
Iron: 209 ug/dL — ABNORMAL HIGH (ref 42–163)
Saturation Ratios: 110 % — ABNORMAL HIGH (ref 20–55)
TIBC: 190 ug/dL — ABNORMAL LOW (ref 202–409)
UIBC: UNDETERMINED ug/dL (ref 117–376)

## 2020-08-09 NOTE — Assessment & Plan Note (Signed)
Normocytic to macrocytic anemia:  Gastroenterology evaluation: Did not feel like he is a candidate for endoscopies or biopsies. Blood transfusion 02/21/2020, 03/23/20,05/03/2020  Treatment plan:Aranesp every 3 weeks 500 mcg (increased from 300 mcg)  Lab review:  01/29/2020: Hemoglobin 7.8, MCV 103.4, platelets 149 03/28/2020: Hemoglobin is 9 (it was 6.9 on 03/22/2020) 04/12/2020: Hemoglobin is7.7 05/03/2020: Hemoglobin 6.5 05/23/2020: Hemoglobin 7.1(received blood transfusion) 06/14/2020: Hemoglobin 7.8 07/05/2020: Hemoglobin 6.2 (received blood transfusion) 07/26/2020: Hemoglobin 7.3 (received blood transfusion)  Bone marrow biopsy: Hypercellular bone marrow for age 23 to 60% cellularity with dyspoietic changes associated with abundant ring sideroblasts. No increase in blastic cells identified. Findings favor MDS with ringed sideroblasts Cytogenetics and FISH: SF3B1 mutation detected (present in 80% of MDS with ringed sideroblasts) no other mutations  Treatment plan: Aranesp and blood transfusions and supportive care Elevated ferritin: Iron overload from frequent blood transfusions: on Exjade.  We will start at 1000 mg a day and may increase up to 2000 mg if needed.  We will monitor ferritin levels with each transfusion.   Return to clinic every 2 weeks for labs and follow-ups.

## 2020-08-12 ENCOUNTER — Telehealth: Payer: Self-pay | Admitting: Hematology and Oncology

## 2020-08-12 NOTE — Telephone Encounter (Signed)
No 11/19 los, no changes made to pt schedule

## 2020-08-16 ENCOUNTER — Other Ambulatory Visit: Payer: Medicare Other

## 2020-08-16 ENCOUNTER — Ambulatory Visit: Payer: Medicare Other

## 2020-08-23 ENCOUNTER — Telehealth: Payer: Self-pay | Admitting: *Deleted

## 2020-08-23 ENCOUNTER — Telehealth: Payer: Self-pay

## 2020-08-23 ENCOUNTER — Other Ambulatory Visit: Payer: Self-pay | Admitting: *Deleted

## 2020-08-23 ENCOUNTER — Other Ambulatory Visit: Payer: Self-pay

## 2020-08-23 ENCOUNTER — Inpatient Hospital Stay: Payer: Medicare Other

## 2020-08-23 ENCOUNTER — Inpatient Hospital Stay: Payer: Medicare Other | Attending: Hematology and Oncology

## 2020-08-23 VITALS — BP 128/72

## 2020-08-23 DIAGNOSIS — D649 Anemia, unspecified: Secondary | ICD-10-CM

## 2020-08-23 DIAGNOSIS — D4620 Refractory anemia with excess of blasts, unspecified: Secondary | ICD-10-CM | POA: Insufficient documentation

## 2020-08-23 LAB — CMP (CANCER CENTER ONLY)
ALT: 21 U/L (ref 0–44)
AST: 14 U/L — ABNORMAL LOW (ref 15–41)
Albumin: 3.7 g/dL (ref 3.5–5.0)
Alkaline Phosphatase: 62 U/L (ref 38–126)
Anion gap: 10 (ref 5–15)
BUN: 25 mg/dL — ABNORMAL HIGH (ref 8–23)
CO2: 23 mmol/L (ref 22–32)
Calcium: 8.7 mg/dL — ABNORMAL LOW (ref 8.9–10.3)
Chloride: 104 mmol/L (ref 98–111)
Creatinine: 1.29 mg/dL — ABNORMAL HIGH (ref 0.61–1.24)
GFR, Estimated: 54 mL/min — ABNORMAL LOW (ref >60)
Glucose, Bld: 107 mg/dL — ABNORMAL HIGH (ref 70–99)
Potassium: 4.1 mmol/L (ref 3.5–5.1)
Sodium: 137 mmol/L (ref 135–145)
Total Bilirubin: 1.1 mg/dL (ref 0.3–1.2)
Total Protein: 7.6 g/dL (ref 6.5–8.1)

## 2020-08-23 LAB — CBC WITH DIFFERENTIAL (CANCER CENTER ONLY)
Abs Immature Granulocytes: 0.01 10*3/uL (ref 0.00–0.07)
Basophils Absolute: 0 10*3/uL (ref 0.0–0.1)
Basophils Relative: 1 %
Eosinophils Absolute: 0.2 10*3/uL (ref 0.0–0.5)
Eosinophils Relative: 4 %
HCT: 20.9 % — ABNORMAL LOW (ref 39.0–52.0)
Hemoglobin: 6.8 g/dL — CL (ref 13.0–17.0)
Immature Granulocytes: 0 %
Lymphocytes Relative: 25 %
Lymphs Abs: 1.4 10*3/uL (ref 0.7–4.0)
MCH: 30.6 pg (ref 26.0–34.0)
MCHC: 32.5 g/dL (ref 30.0–36.0)
MCV: 94.1 fL (ref 80.0–100.0)
Monocytes Absolute: 0.9 10*3/uL (ref 0.1–1.0)
Monocytes Relative: 17 %
Neutro Abs: 2.8 10*3/uL (ref 1.7–7.7)
Neutrophils Relative %: 53 %
Platelet Count: 152 10*3/uL (ref 150–400)
RBC: 2.22 MIL/uL — ABNORMAL LOW (ref 4.22–5.81)
RDW: 23.7 % — ABNORMAL HIGH (ref 11.5–15.5)
WBC Count: 5.3 10*3/uL (ref 4.0–10.5)
nRBC: 0.4 % — ABNORMAL HIGH (ref 0.0–0.2)

## 2020-08-23 LAB — SAMPLE TO BLOOD BANK

## 2020-08-23 MED ORDER — DARBEPOETIN ALFA 500 MCG/ML IJ SOSY
PREFILLED_SYRINGE | INTRAMUSCULAR | Status: AC
  Filled 2020-08-23: qty 1

## 2020-08-23 MED ORDER — DARBEPOETIN ALFA 500 MCG/ML IJ SOSY
500.0000 ug | PREFILLED_SYRINGE | Freq: Once | INTRAMUSCULAR | Status: AC
  Administered 2020-08-23: 500 ug via SUBCUTANEOUS

## 2020-08-23 NOTE — Telephone Encounter (Signed)
Orders for blood entered and verified T&C and Prepare released per Blood Bank for transfusion this Monday 08/26/2020

## 2020-08-23 NOTE — Telephone Encounter (Signed)
Contacted patient and made him aware of appt on Monday for transfusion of 2 units PRBC's. Okay to wait till Monday for transfusion per Gudena.

## 2020-08-23 NOTE — Patient Instructions (Signed)
Darbepoetin Alfa injection What is this medicine? DARBEPOETIN ALFA (dar be POE e tin AL fa) helps your body make more red blood cells. It is used to treat anemia caused by chronic kidney failure and chemotherapy. This medicine may be used for other purposes; ask your health care provider or pharmacist if you have questions. COMMON BRAND NAME(S): Aranesp What should I tell my health care provider before I take this medicine? They need to know if you have any of these conditions:  blood clotting disorders or history of blood clots  cancer patient not on chemotherapy  cystic fibrosis  heart disease, such as angina, heart failure, or a history of a heart attack  hemoglobin level of 12 g/dL or greater  high blood pressure  low levels of folate, iron, or vitamin B12  seizures  an unusual or allergic reaction to darbepoetin, erythropoietin, albumin, hamster proteins, latex, other medicines, foods, dyes, or preservatives  pregnant or trying to get pregnant  breast-feeding How should I use this medicine? This medicine is for injection into a vein or under the skin. It is usually given by a health care professional in a hospital or clinic setting. If you get this medicine at home, you will be taught how to prepare and give this medicine. Use exactly as directed. Take your medicine at regular intervals. Do not take your medicine more often than directed. It is important that you put your used needles and syringes in a special sharps container. Do not put them in a trash can. If you do not have a sharps container, call your pharmacist or healthcare provider to get one. A special MedGuide will be given to you by the pharmacist with each prescription and refill. Be sure to read this information carefully each time. Talk to your pediatrician regarding the use of this medicine in children. While this medicine may be used in children as young as 1 month of age for selected conditions, precautions do  apply. Overdosage: If you think you have taken too much of this medicine contact a poison control center or emergency room at once. NOTE: This medicine is only for you. Do not share this medicine with others. What if I miss a dose? If you miss a dose, take it as soon as you can. If it is almost time for your next dose, take only that dose. Do not take double or extra doses. What may interact with this medicine? Do not take this medicine with any of the following medications:  epoetin alfa This list may not describe all possible interactions. Give your health care provider a list of all the medicines, herbs, non-prescription drugs, or dietary supplements you use. Also tell them if you smoke, drink alcohol, or use illegal drugs. Some items may interact with your medicine. What should I watch for while using this medicine? Your condition will be monitored carefully while you are receiving this medicine. You may need blood work done while you are taking this medicine. This medicine may cause a decrease in vitamin B6. You should make sure that you get enough vitamin B6 while you are taking this medicine. Discuss the foods you eat and the vitamins you take with your health care professional. What side effects may I notice from receiving this medicine? Side effects that you should report to your doctor or health care professional as soon as possible:  allergic reactions like skin rash, itching or hives, swelling of the face, lips, or tongue  breathing problems  changes in   vision  chest pain  confusion, trouble speaking or understanding  feeling faint or lightheaded, falls  high blood pressure  muscle aches or pains  pain, swelling, warmth in the leg  rapid weight gain  severe headaches  sudden numbness or weakness of the face, arm or leg  trouble walking, dizziness, loss of balance or coordination  seizures (convulsions)  swelling of the ankles, feet, hands  unusually weak or  tired Side effects that usually do not require medical attention (report to your doctor or health care professional if they continue or are bothersome):  diarrhea  fever, chills (flu-like symptoms)  headaches  nausea, vomiting  redness, stinging, or swelling at site where injected This list may not describe all possible side effects. Call your doctor for medical advice about side effects. You may report side effects to FDA at 1-800-FDA-1088. Where should I keep my medicine? Keep out of the reach of children. Store in a refrigerator between 2 and 8 degrees C (36 and 46 degrees F). Do not freeze. Do not shake. Throw away any unused portion if using a single-dose vial. Throw away any unused medicine after the expiration date. NOTE: This sheet is a summary. It may not cover all possible information. If you have questions about this medicine, talk to your doctor, pharmacist, or health care provider.  2020 Elsevier/Gold Standard (2017-09-22 16:44:20)  

## 2020-08-24 LAB — PREPARE RBC (CROSSMATCH)

## 2020-08-26 ENCOUNTER — Inpatient Hospital Stay: Payer: Medicare Other

## 2020-08-26 ENCOUNTER — Other Ambulatory Visit: Payer: Self-pay

## 2020-08-26 DIAGNOSIS — D4620 Refractory anemia with excess of blasts, unspecified: Secondary | ICD-10-CM | POA: Diagnosis not present

## 2020-08-26 DIAGNOSIS — D649 Anemia, unspecified: Secondary | ICD-10-CM

## 2020-08-26 MED ORDER — ACETAMINOPHEN 325 MG PO TABS
650.0000 mg | ORAL_TABLET | Freq: Once | ORAL | Status: AC
  Administered 2020-08-26: 650 mg via ORAL

## 2020-08-26 MED ORDER — ACETAMINOPHEN 325 MG PO TABS
ORAL_TABLET | ORAL | Status: AC
  Filled 2020-08-26: qty 2

## 2020-08-26 MED ORDER — DIPHENHYDRAMINE HCL 25 MG PO CAPS
25.0000 mg | ORAL_CAPSULE | Freq: Once | ORAL | Status: AC
  Administered 2020-08-26: 25 mg via ORAL

## 2020-08-26 MED ORDER — DIPHENHYDRAMINE HCL 25 MG PO CAPS
ORAL_CAPSULE | ORAL | Status: AC
  Filled 2020-08-26: qty 1

## 2020-08-26 MED ORDER — SODIUM CHLORIDE 0.9% IV SOLUTION
250.0000 mL | Freq: Once | INTRAVENOUS | Status: AC
  Administered 2020-08-26: 250 mL via INTRAVENOUS
  Filled 2020-08-26: qty 250

## 2020-08-26 MED FILL — DEFERASIROX 500 MG TBSO: 500 | 30 days supply | Qty: 60 | Fill #1

## 2020-08-26 NOTE — Patient Instructions (Signed)

## 2020-08-27 LAB — TYPE AND SCREEN
ABO/RH(D): B POS
Antibody Screen: POSITIVE
DAT, IgG: POSITIVE
Donor AG Type: NEGATIVE
Donor AG Type: NEGATIVE
Unit division: 0
Unit division: 0

## 2020-08-27 LAB — BPAM RBC
Blood Product Expiration Date: 202112292359
Blood Product Expiration Date: 202201012359
ISSUE DATE / TIME: 202112060849
ISSUE DATE / TIME: 202112060849
Unit Type and Rh: 7300
Unit Type and Rh: 7300

## 2020-08-28 ENCOUNTER — Other Ambulatory Visit: Payer: Self-pay

## 2020-08-28 ENCOUNTER — Other Ambulatory Visit: Payer: Medicare Other | Admitting: Hospice

## 2020-08-28 DIAGNOSIS — Z515 Encounter for palliative care: Secondary | ICD-10-CM

## 2020-08-28 DIAGNOSIS — D4620 Refractory anemia with excess of blasts, unspecified: Secondary | ICD-10-CM

## 2020-08-28 NOTE — Progress Notes (Signed)
  [facility] Consult Note Telephone: [phone]  Fax: [phone]  PATIENT NAME: Ricky [name] DOB: 04/19/1931 MRN: [id number]  PRIMARY CARE PROVIDER:    Freeman, Courtney James, NP [address] [place],  [place] [address]  REFERRING PROVIDER: Freeman, Courtney James, NP Freeman, Courtney James, NP [address] [place],  [place] [address]  RESPONSIBLE PARTY:Shamari Secourka Jnr [phone] Extended Emergency Contact Information Primary Emergency Contact: [place] Home Phone: [phone] Mobile Phone: [phone] Relation: Son Patient: [phone] best number to call Spouse: Klein Secourka  I met face to face with patient and family in home/facility.  RECOMMENDATIONS/PLAN:    Visit consisted of building trust and follow up on palliative care. Patient's son Kamdyn was present during visit.   CODE STATUS: CODE STATUS reviewed.  Patient is a DO NOT RESUSCITATE.   today.  GOALS OF CARE: Goals of care include to maximize quality of life and symptom management. MOST selections include limited additional intervention, IV fluids as indicated, antibiotics as indicated, no feeding tube.   Follow up Palliative Care Visit: Palliative care will continue to follow for goals of care clarification and symptom management.  Follow-up in a month/as needed.  Functional decline/Symptom Management:  Fatigue: Patient recently completed physical therapy; son plans to arrange more. Fatigue is related to anemia of chronic disease.  Patient gets blood transfusion as needed.  Followed by Dr. Gudena for low-grade myelodysplastic syndrome.  Encouraged balance of rest and performance activity. Sleep disruption is ongoing due to diuretic and coffee intake;  reiterated Lasix to be taking earlier in the day, avoid coffee at night time. Less fluid intake at night time, sleep hygiene  promotes sleep. Melatonin 3mg at  night time also discussed.   Patient denies pain/discomfort, no palpitations, no bleeding, no syncope, no shortness of breath.  Patient in no medical acuity. Palliative will continue to monitor for symptom management/decline and make recommendations as needed. Constipation: Switch from Miralax to Senakot daily and increase to BID if needed. Patient said he had several bowel movements almost like diarrhea with use of Miralax.   Family /Caregiver/Community Supports: Patient lives at home with his spouse. His son Jerimyah coordinates his care/medications. Strong family support system identified. [facility] checks in on patient.  I spent 50 minutes providing this consultation; time includes time spent with patient/family, chart review, provider coordination,  and documentation. More than 50% of the time in this consultation was spent on coordinating communication  CHIEF COMPLAIN/HISTORY OF PRESENT ILLNESS:  Ricky [name] is a 86 y.o. male with multiple medical problems including low-grade myelodysplastic syndrome, anemia of chronic disease, paroxysmal A. fib.  Pacemaker present.  Palliative Care was asked to follow this patient by consultation request of full importance of quitting Freeman Courtney, NP  to help address advance care planning and goals of care.  CODE STATUS:DNR  PPS: 50% HOSPICE ELIGIBILITY/DIAGNOSIS: TBD  PAST MEDICAL HISTORY:  Past Medical History:  Diagnosis Date  . A-fib (HCC)   . Anemia   . Anemia of chronic disease 02/02/2020  . Atrial fibrillation (HCC)   . Boerhaave's syndrome   . Chronic anticoagulation 01/22/2020  . Colon polyps   . COVID-19   . Hx of colonic polyps 01/22/2020   2017 - [place]  . Hypomagnesemia   . Low grade myelodysplastic syndrome lesions (HCC) 02/02/2020  . Lower urinary tract symptoms due to benign prostatic hyperplasia   . Pacemaker   . Paroxysmal atrial   fibrillation (HCC)   . Prostate enlargement   . Symptomatic anemia 12/26/2019  . Vitamin D  deficiency     SOCIAL HX:  Social History   Tobacco Use  . Smoking status: Former Smoker    Types: Cigarettes    Quit date: 01/21/1970    Years since quitting: 50.6  . Smokeless tobacco: Never Used  Substance Use Topics  . Alcohol use: Not Currently    ALLERGIES: No Known Allergies   PERTINENT MEDICATIONS:  Outpatient Encounter Medications as of 08/28/2020  Medication Sig  . apixaban (ELIQUIS) 5 MG TABS tablet Take 1 tablet (5 mg total) by mouth 2 (two) times daily.  . Azelastine HCl 0.15 % SOLN Place 1 spray into the nose as needed.  . cholecalciferol (VITAMIN D3) 25 MCG (1000 UNIT) tablet Take 1,000 Units by mouth daily.  . cyanocobalamin 1000 MCG tablet Take 1,000 mcg by mouth daily.  . deferasirox (EXJADE) 500 MG disintegrating tablet Take 2 tablets (1,000 mg total) by mouth daily before breakfast.  . desonide (DESOWEN) 0.05 % cream Apply 1 application topically 2 (two) times daily.  . fluorouracil (EFUDEX) 5 % cream Apply 1 application topically 2 (two) times daily.  . furosemide (LASIX) 20 MG tablet May take 20 -40 mg Daily  . Magnesium 250 MG TABS Take 1 tablet (250 mg total) by mouth daily.  . mupirocin ointment (BACTROBAN) 2 % Apply 1 application topically as directed.  . simvastatin (ZOCOR) 20 MG tablet Take 20 mg by mouth daily.   No facility-administered encounter medications on file as of 08/28/2020.    PHYSICAL EXAM/ROS:  General: NAD, cooperative Cardiovascular: regular rate and rhythm; denies chest pain Pulmonary: clear ant/post fields Abdomen: soft, nontender, + bowel sounds GU: no suprapubic tenderness Extremities: no edema, no joint deformities Skin: no rashes to visible skin Neurological: Weakness but otherwise nonfocal  Note:  Portions of this note were generated with Dragon dictation software. Dictation errors may occur despite attempts at proofreading.   I , NP   

## 2020-09-03 ENCOUNTER — Encounter: Payer: Medicare Other | Admitting: Internal Medicine

## 2020-09-06 ENCOUNTER — Other Ambulatory Visit: Payer: Medicare Other

## 2020-09-06 ENCOUNTER — Ambulatory Visit: Payer: Medicare Other

## 2020-09-06 ENCOUNTER — Ambulatory Visit: Payer: Medicare Other | Admitting: Hematology and Oncology

## 2020-09-10 ENCOUNTER — Telehealth: Payer: Self-pay | Admitting: Hematology and Oncology

## 2020-09-10 NOTE — Telephone Encounter (Signed)
Rescheduled appt due to provider PAL. Patient's son is aware of the changes.

## 2020-09-16 ENCOUNTER — Inpatient Hospital Stay (HOSPITAL_BASED_OUTPATIENT_CLINIC_OR_DEPARTMENT_OTHER): Payer: Medicare Other | Admitting: Medical

## 2020-09-16 ENCOUNTER — Inpatient Hospital Stay: Payer: Medicare Other

## 2020-09-16 ENCOUNTER — Other Ambulatory Visit: Payer: Self-pay | Admitting: *Deleted

## 2020-09-16 ENCOUNTER — Ambulatory Visit (HOSPITAL_COMMUNITY)
Admission: RE | Admit: 2020-09-16 | Discharge: 2020-09-16 | Disposition: A | Discharge status: Home / Self Care | Payer: Medicare Other | Source: Ambulatory Visit | Attending: Medical | Admitting: Medical

## 2020-09-16 ENCOUNTER — Other Ambulatory Visit: Payer: Self-pay

## 2020-09-16 VITALS — BP 102/66 | HR 78 | Temp 98.1°F | Ht 71.0 in

## 2020-09-16 DIAGNOSIS — R0781 Pleurodynia: Secondary | ICD-10-CM | POA: Insufficient documentation

## 2020-09-16 DIAGNOSIS — D638 Anemia in other chronic diseases classified elsewhere: Secondary | ICD-10-CM | POA: Diagnosis not present

## 2020-09-16 DIAGNOSIS — D649 Anemia, unspecified: Secondary | ICD-10-CM | POA: Diagnosis not present

## 2020-09-16 DIAGNOSIS — D4620 Refractory anemia with excess of blasts, unspecified: Secondary | ICD-10-CM | POA: Diagnosis not present

## 2020-09-16 LAB — CMP (CANCER CENTER ONLY)
ALT: 23 U/L (ref 0–44)
AST: 14 U/L — ABNORMAL LOW (ref 15–41)
Albumin: 3.6 g/dL (ref 3.5–5.0)
Alkaline Phosphatase: 54 U/L (ref 38–126)
Anion gap: 4 — ABNORMAL LOW (ref 5–15)
BUN: 20 mg/dL (ref 8–23)
CO2: 26 mmol/L (ref 22–32)
Calcium: 8.6 mg/dL — ABNORMAL LOW (ref 8.9–10.3)
Chloride: 104 mmol/L (ref 98–111)
Creatinine: 1.07 mg/dL (ref 0.61–1.24)
GFR, Estimated: >60 mL/min (ref >60)
Glucose, Bld: 120 mg/dL — ABNORMAL HIGH (ref 70–99)
Potassium: 4.2 mmol/L (ref 3.5–5.1)
Sodium: 134 mmol/L — ABNORMAL LOW (ref 135–145)
Total Bilirubin: 1.1 mg/dL (ref 0.3–1.2)
Total Protein: 7.4 g/dL (ref 6.5–8.1)

## 2020-09-16 LAB — CBC WITH DIFFERENTIAL (CANCER CENTER ONLY)
Abs Immature Granulocytes: 0.01 10*3/uL (ref 0.00–0.07)
Basophils Absolute: 0 10*3/uL (ref 0.0–0.1)
Basophils Relative: 0 %
Eosinophils Absolute: 0.2 10*3/uL (ref 0.0–0.5)
Eosinophils Relative: 3 %
HCT: 22.5 % — ABNORMAL LOW (ref 39.0–52.0)
Hemoglobin: 7.1 g/dL — ABNORMAL LOW (ref 13.0–17.0)
Immature Granulocytes: 0 %
Lymphocytes Relative: 22 %
Lymphs Abs: 1 10*3/uL (ref 0.7–4.0)
MCH: 28.9 pg (ref 26.0–34.0)
MCHC: 31.6 g/dL (ref 30.0–36.0)
MCV: 91.5 fL (ref 80.0–100.0)
Monocytes Absolute: 0.7 10*3/uL (ref 0.1–1.0)
Monocytes Relative: 16 %
Neutro Abs: 2.6 10*3/uL (ref 1.7–7.7)
Neutrophils Relative %: 59 %
Platelet Count: 175 10*3/uL (ref 150–400)
RBC: 2.46 MIL/uL — ABNORMAL LOW (ref 4.22–5.81)
RDW: 24.1 % — ABNORMAL HIGH (ref 11.5–15.5)
WBC Count: 4.5 10*3/uL (ref 4.0–10.5)
nRBC: 0 % (ref 0.0–0.2)

## 2020-09-16 LAB — SAMPLE TO BLOOD BANK

## 2020-09-16 MED ORDER — TRAMADOL HCL 50 MG PO TABS: 50.0000 mg | ORAL_TABLET | Freq: Four times a day (QID) | ORAL | 0 refills | Status: DC | PRN

## 2020-09-16 NOTE — Progress Notes (Signed)
Symptoms Management Clinic Progress Note   Ricky [name] [id number] Sep 01, 1931 86 y.o.  Maretta Bees is managed by Dr. Serena Croissant  Actively treated with chemotherapy/immunotherapy/hormonal therapy: Supportive care with Aranesp and transfusions as needed  Next scheduled appointment with provider: 10/16/2020  Assessment: Plan:    Rib pain on right side - Plan: DG Ribs Unilateral W/Chest Right, traMADol (ULTRAM) 50 MG tablet  Symptomatic anemia - Plan: Care order/instruction, Care order/instruction, Care order/instruction, Informed Consent Details: Physician/Practitioner Attestation; Transcribe to consent form and obtain patient signature, Type and screen, Care order/instruction, Prepare RBC (crossmatch), Type and screen  Low grade myelodysplastic syndrome lesions (HCC) - Plan: Care order/instruction  Anemia of chronic disease - Plan: Care order/instruction, Care order/instruction, Care order/instruction, Care order/instruction   Right rib pain following a fall: The patient was referred for a rib series which returned showing:  FINDINGS: Indwelling left chest wall pacing device. No pneumothorax, focal consolidation or large pleural effusion. Cardiomediastinal silhouette within normal limits.  Right glenohumeral osteoarthrosis. Minimally displaced fractures involving the lateral eighth and ninth ribs. Minimally displaced distal right eleventh rib fracture.  IMPRESSION: Minimally displaced fractures of the right eighth, ninth and eleventh ribs.  He was given a prescription for tramadol 50 mg every 6 hours as needed for pain.   Symptomatic anemia: CBC returned today with a hemoglobin of 7.1.  Crossmatch for 2 units of packed red blood cells to be transfused on 09/18/2020 has been ordered.   Myelodysplasia: The patient was scheduled to receive an Aranesp injection on 09/19/2020.  He is scheduled to be seen in follow-up by Dr. Pamelia Hoit on 10/16/2020.   Please see  After Visit Summary for patient specific instructions.  Future Appointments  Date Time Provider Department Center  09/18/2020  8:30 AM SYMPTOM MANAGEMENT CLINIC 2 [facility] None  09/19/2020  8:15 AM [facility] FLUSH [facility] None  09/26/2020  8:30 AM [facility] DEVICE REMOTES [facility]  10/16/2020  8:00 AM [facility] LAB [facility] None  10/16/2020  8:30 AM Serena Croissant, MD [facility] None  10/16/2020  9:15 AM [facility] FLUSH [facility] None  11/15/2020  7:30 AM [facility] LAB [facility] None  11/15/2020  8:15 AM [facility] FLUSH [facility] None  12/02/2020  8:00 AM Nahser, Deloris Ping, MD [facility]  12/26/2020  8:30 AM [facility] DEVICE REMOTES [facility]  03/27/2021  8:30 AM [facility] DEVICE REMOTES [facility]  06/26/2021  8:30 AM [facility] DEVICE REMOTES [facility]    Orders Placed This Encounter  Procedures  . DG Ribs Unilateral W/Chest Right  . Informed Consent Details: Physician/Practitioner Attestation; Transcribe to consent form and obtain patient signature  . Care order/instruction  . Type and screen  . Prepare RBC (crossmatch)       Subjective:   Patient ID:  ALWALEED HODGEN is a 86 y.o. (DOB Apr 14, 1931) male.  Chief Complaint:  Chief Complaint  Patient presents with  . Fatigue  . Rib Injury    R side    HPI [name]  is a 86 y.o. male with a diagnosis of myelodysplasia.  He is followed by Dr. Serena Croissant and is treated with supportive care.  He is given Aranesp and blood transfusions as needed.  He presents today with significant weakness and would like to have a transfusion.  His hemoglobin returned today at 7.1.  He also had a fall at home when he misstepped and fell onto his right side on Christmas evening.  He is having pain with deep breathing  and with movement.  He denies nausea, vomiting, diarrhea, fevers, chills, or sweats.  He presents to the clinic today with  his son.   Medications: I have reviewed the patient's current medications.  Allergies: No Known Allergies  Past Medical History:  Diagnosis Date  . A-fib ([place])   . Anemia   . Anemia of chronic disease 02/02/2020  . Atrial fibrillation ([place])   . Boerhaave's syndrome   . Chronic anticoagulation 01/22/2020  . Colon polyps   . COVID-19   . Hx of colonic polyps 01/22/2020   2017 - [place]  . Hypomagnesemia   . Low grade myelodysplastic syndrome lesions ([place]) 02/02/2020  . Lower urinary tract symptoms due to benign prostatic hyperplasia   . Pacemaker   . Paroxysmal atrial fibrillation (HCC)   . Prostate enlargement   . Symptomatic anemia 12/26/2019  . Vitamin D deficiency     Past Surgical History:  Procedure Laterality Date  . esophogeal tear repair    . PACEMAKER INSERTION  2009, 2017  . THORACOTOMY     esophageal perforation  . TOTAL HIP ARTHROPLASTY Right   . TOTAL KNEE ARTHROPLASTY Left     Family History  Problem Relation Age of Onset  . Cirrhosis Mother   . Alcoholism Mother   . Heart attack Father 79  . Heart disease Brother   . Diabetes Brother   . Brain cancer Daughter     Social History   Socioeconomic History  . Marital status: Married    Spouse name: Not on file  . Number of children: 2  . Years of education: Not on file  . Highest education level: Not on file  Occupational History  . Occupation: retired  Tobacco Use  . Smoking status: Former Smoker    Types: Cigarettes    Quit date: 01/21/1970    Years since quitting: 50.6  . Smokeless tobacco: Never Used  Vaping Use  . Vaping Use: Never used  Substance and Sexual Activity  . Alcohol use: Not Currently  . Drug use: Never  . Sexual activity: Yes  Other Topics Concern  . Not on file  Social History Narrative  . Not on file   Social Determinants of Health   Financial Resource Strain: Not on file  Food Insecurity: Not on file  Transportation Needs: Not on file  Physical Activity: Not on file   Stress: Not on file  Social Connections: Not on file  Intimate Partner Violence: Not on file    Past Medical History, Surgical history, Social history, and Family history were reviewed and updated as appropriate.   Please see review of systems for further details on the patient's review from today.   Review of Systems:  Review of Systems  Constitutional: Positive for fatigue. Negative for chills, diaphoresis and fever.  HENT: Negative for trouble swallowing and voice change.   Respiratory: Negative for cough, chest tightness, shortness of breath and wheezing.   Cardiovascular: Positive for chest pain. Negative for palpitations.  Gastrointestinal: Negative for abdominal pain, constipation, diarrhea, nausea and vomiting.  Musculoskeletal: Negative for back pain and myalgias.  Neurological: Negative for dizziness, light-headedness and headaches.    Objective:   Physical Exam:  BP 102/66 (BP Location: Left Arm, Patient Position: Sitting)   Pulse 78   Temp 98.1 F (36.7 C) (Tympanic)   Ht 5\' 11"  (1.803 m)   SpO2 98%   BMI 27.06 kg/m  ECOG: 1  Physical Exam Constitutional:      General: He is  not in acute distress.    Appearance: He is not diaphoretic.  HENT:     Head: Normocephalic and atraumatic.  Cardiovascular:     Rate and Rhythm: Normal rate and regular rhythm.     Heart sounds: Normal heart sounds. No murmur heard. No friction rub. No gallop.   Pulmonary:     Effort: Pulmonary effort is normal. No respiratory distress.     Breath sounds: Normal breath sounds. No wheezing or rales.  Musculoskeletal:        General: Tenderness (Pain along the right inferior lateral ribs.) present. No deformity.  Skin:    General: Skin is warm and dry.     Findings: No erythema or rash.  Neurological:     Mental Status: He is alert.     Gait: Gait abnormal (The patient is ambulating with the use of a wheelchair.).     Lab Review:     Component Value Date/Time   NA 134 (L)  09/16/2020 1210   NA 137 06/03/2020 0835   K 4.2 09/16/2020 1210   CL 104 09/16/2020 1210   CO2 26 09/16/2020 1210   GLUCOSE 120 (H) 09/16/2020 1210   BUN 20 09/16/2020 1210   BUN 29 (H) 06/03/2020 0835   CREATININE 1.07 09/16/2020 1210   CALCIUM 8.6 (L) 09/16/2020 1210   PROT 7.4 09/16/2020 1210   ALBUMIN 3.6 09/16/2020 1210   AST 14 (L) 09/16/2020 1210   ALT 23 09/16/2020 1210   ALKPHOS 54 09/16/2020 1210   BILITOT 1.1 09/16/2020 1210   GFRNONAA >60 09/16/2020 1210   GFRAA 62 06/03/2020 0835   GFRAA >60 03/28/2020 0919       Component Value Date/Time   WBC 4.5 09/16/2020 1210   WBC 5.1 01/22/2020 1110   RBC 2.46 (L) 09/16/2020 1210   HGB 7.1 (L) 09/16/2020 1210   HCT 22.5 (L) 09/16/2020 1210   PLT 175 09/16/2020 1210   MCV 91.5 09/16/2020 1210   MCH 28.9 09/16/2020 1210   MCHC 31.6 09/16/2020 1210   RDW 24.1 (H) 09/16/2020 1210   LYMPHSABS 1.0 09/16/2020 1210   MONOABS 0.7 09/16/2020 1210   EOSABS 0.2 09/16/2020 1210   BASOSABS 0.0 09/16/2020 1210   -------------------------------  Imaging from last 24 hours (if applicable):  Radiology interpretation: DG Ribs Unilateral W/Chest Right  Result Date: 09/16/2020 CLINICAL DATA:  Fall on 12/24 onto right ribs. Now with rib pain. EXAM: RIGHT RIBS AND CHEST - 3+ VIEW COMPARISON:  12/26/2019 and prior. FINDINGS: Indwelling left chest wall pacing device. No pneumothorax, focal consolidation or large pleural effusion. Cardiomediastinal silhouette within normal limits. Right glenohumeral osteoarthrosis. Minimally displaced fractures involving the lateral eighth and ninth ribs. Minimally displaced distal right eleventh rib fracture. IMPRESSION: Minimally displaced fractures of the right eighth, ninth and eleventh ribs. Electronically Signed   By: Stana Bunting M.D.   On: 09/16/2020 13:50

## 2020-09-16 NOTE — Addendum Note (Signed)
Addended by: Ceasar Mons on: 09/16/2020 05:20 PM   Modules accepted: Orders

## 2020-09-16 NOTE — Addendum Note (Signed)
Addended by: Harle Stanford on: 09/16/2020 06:17 PM   Modules accepted: Orders

## 2020-09-16 NOTE — Patient Instructions (Signed)

## 2020-09-16 NOTE — Progress Notes (Signed)
These results were called to Ricky [name] son and were reviewed with him . His questions were answered. He expressed understanding.

## 2020-09-16 NOTE — Progress Notes (Signed)
Received call from pt son stating pt is experiencing symptoms of anemia including extreme fatigue and balance issues.  Marga Hoots, PA notified and states pt needing to be seen today in [facility].  Apt scheduled and pt verbalized understanding.

## 2020-09-18 ENCOUNTER — Other Ambulatory Visit: Payer: Self-pay

## 2020-09-18 ENCOUNTER — Telehealth: Payer: Self-pay | Admitting: Hematology and Oncology

## 2020-09-18 ENCOUNTER — Inpatient Hospital Stay: Payer: Medicare Other

## 2020-09-18 DIAGNOSIS — D4620 Refractory anemia with excess of blasts, unspecified: Secondary | ICD-10-CM | POA: Diagnosis not present

## 2020-09-18 DIAGNOSIS — D649 Anemia, unspecified: Secondary | ICD-10-CM

## 2020-09-18 MED ORDER — ACETAMINOPHEN 325 MG PO TABS
650.0000 mg | ORAL_TABLET | Freq: Once | ORAL | Status: AC
  Administered 2020-09-18: 650 mg via ORAL

## 2020-09-18 MED ORDER — DIPHENHYDRAMINE HCL 25 MG PO CAPS
25.0000 mg | ORAL_CAPSULE | Freq: Once | ORAL | Status: AC
  Administered 2020-09-18: 25 mg via ORAL

## 2020-09-18 MED ORDER — DIPHENHYDRAMINE HCL 25 MG PO CAPS
ORAL_CAPSULE | ORAL | Status: AC
  Filled 2020-09-18: qty 1

## 2020-09-18 MED ORDER — ACETAMINOPHEN 325 MG PO TABS
ORAL_TABLET | ORAL | Status: AC
  Filled 2020-09-18: qty 2

## 2020-09-18 MED ORDER — SODIUM CHLORIDE 0.9% IV SOLUTION
250.0000 mL | Freq: Once | INTRAVENOUS | Status: AC
  Administered 2020-09-18: 250 mL via INTRAVENOUS
  Filled 2020-09-18: qty 250

## 2020-09-18 NOTE — Telephone Encounter (Signed)
Scheduled appt per 12/29 sch msg - pt is aware of appt date and time

## 2020-09-18 NOTE — Patient Instructions (Signed)

## 2020-09-19 ENCOUNTER — Other Ambulatory Visit: Payer: Medicare Other

## 2020-09-19 ENCOUNTER — Ambulatory Visit: Payer: Medicare Other

## 2020-09-19 LAB — BPAM RBC
Blood Product Expiration Date: 202201272359
Blood Product Expiration Date: 202202012359
ISSUE DATE / TIME: 202112290914
ISSUE DATE / TIME: 202112290914
Unit Type and Rh: 7300
Unit Type and Rh: 7300

## 2020-09-19 LAB — TYPE AND SCREEN
ABO/RH(D): B POS
Antibody Screen: POSITIVE
DAT, IgG: POSITIVE
Donor AG Type: NEGATIVE
Donor AG Type: NEGATIVE
Unit division: 0
Unit division: 0

## 2020-09-26 ENCOUNTER — Other Ambulatory Visit: Payer: Self-pay

## 2020-09-26 ENCOUNTER — Other Ambulatory Visit: Payer: Medicare Other | Admitting: Hospice

## 2020-09-26 ENCOUNTER — Ambulatory Visit (INDEPENDENT_AMBULATORY_CARE_PROVIDER_SITE_OTHER): Payer: Medicare Other

## 2020-09-26 DIAGNOSIS — I495 Sick sinus syndrome: Secondary | ICD-10-CM

## 2020-09-26 DIAGNOSIS — D4620 Refractory anemia with excess of blasts, unspecified: Secondary | ICD-10-CM

## 2020-09-26 DIAGNOSIS — Z515 Encounter for palliative care: Secondary | ICD-10-CM

## 2020-09-26 LAB — CUP PACEART REMOTE DEVICE CHECK
Battery Remaining Longevity: 110 mo
Battery Remaining Percentage: 95.5 %
Battery Voltage: 2.99 V
Brady Statistic AP VP Percent: 1 %
Brady Statistic AP VS Percent: 2.4 %
Brady Statistic AS VP Percent: 1.7 %
Brady Statistic AS VS Percent: 96 %
Brady Statistic RA Percent Paced: 1.9 %
Brady Statistic RV Percent Paced: 1.7 %
Date Time Interrogation Session: 20220106020029
Implantable Lead Implant Date: 20090807
Implantable Lead Implant Date: 20090807
Implantable Lead Location: 753859
Implantable Lead Location: 753860
Implantable Pulse Generator Implant Date: 20170719
Lead Channel Impedance Value: 310 Ohm
Lead Channel Impedance Value: 350 Ohm
Lead Channel Pacing Threshold Amplitude: 0.5 V
Lead Channel Pacing Threshold Amplitude: 1.25 V
Lead Channel Pacing Threshold Pulse Width: 0.5 ms
Lead Channel Pacing Threshold Pulse Width: 1 ms
Lead Channel Sensing Intrinsic Amplitude: 3 mV
Lead Channel Sensing Intrinsic Amplitude: 6.3 mV
Lead Channel Setting Pacing Amplitude: 2.25 V
Lead Channel Setting Pacing Amplitude: 2.5 V
Lead Channel Setting Pacing Pulse Width: 1 ms
Lead Channel Setting Sensing Sensitivity: 2 mV
Pulse Gen Model: 2272
Pulse Gen Serial Number: 7927766

## 2020-09-26 NOTE — Progress Notes (Signed)
[place] [facility] Consult Note Telephone: [phone]  Fax: [phone]  PATIENT NAME: Ricky [name] DOB: April 09, 1931 MRN: [id number]  PRIMARY CARE PROVIDER: Roetta Sessions, NP [address] [place], [place] [address]  [place], Rocco Pauls, NP Roetta Sessions, NP [address] [place], [place] [address]  RESPONSIBLE PARTY:[name] [phone] Extended Emergency Contact Information Primary Emergency Contact: Ceja,Thomas Home Phone: [phone] Mobile Phone: [phone] Relation: Son Patient: [phone] best number to call Spouse: Emeterio Reeve  I met face to face with patient and family in home/facility.  RECOMMENDATIONS/PLAN:   Visit consisted of building trust and follow up on palliative care.Patient's son seif teichert during visit.   CODE STATUS:CODE STATUS reviewed. Patient is a DO NOT RESUSCITATE.   GOALS OF CARE: Goals of care include to maximize quality of life and symptom management.MOST selections include limited additional intervention, IV fluids as indicated, antibiotics as indicated, no feeding tube.   Follow up Palliative Care Visit: Palliative care will continue to follow for goals of care clarification and symptom management.Follow-up in 3 months/as needed.  Functional decline/Symptom Management: Patient is a high fall risk; last fall in Sep 13, 2020 with rib fracture, treated conservatively. Tramadol is on hand if needed for pain.  Patient denied pain/discomfort, no difficulty breathing.  Education on use of rolling walker for safety and support.  Fatigue: Fatigue is related to anemia of chronicdisease.Patient gets blood transfusion as needed. Followed by Dr. Lindi Adie for low-grade myelodysplastic syndrome. Encouraged balance of rest and performance activity.  Patient just commenced physical  therapy. Sleep disruption i has improved related to better management of diuretic and coffee intake; .  Continue melatonin 3mg  at night as needed for sleep Blood pressure today 96/62.  Patient denies pain/discomfort, no palpitations, no bleeding, no dizziness, no syncope, no shortness of breath.  He reported very limited fluid intake today.  Son provided him with water.  Education provided on need for adequate hydration to help maintain blood pressure. Patient in no medical acuity.Palliative will continue to monitor for symptom management/decline and make recommendations as needed.  Family /Caregiver/Community Supports:Patient lives at home with his spouse. His son Katai coordinates his care/medications. Strong family support system identified. Support service from facility- [facility] checks in on patient for bathroom checks and assistance with ADLs.  I spent 67minutes providing this consultation; time includes time spent with patient/family, chart review, provider coordination, and documentation. More than 50% of the time in this consultation was spent on coordinating communication  CHIEF COMPLAIN/HISTORY OF PRESENT ILLNESS:Keona T Sescourkais a 87 y.o.malewith multiple medical problems including low-grade myelodysplastic syndrome, anemia of chronic disease, paroxysmal A. fib. Pacemaker present; gait instability with recent fall/rib fracture treated conservatively. Palliative Care was asked to follow this patient by consultation request offull importance of quitting Clide Deutscher, NP to help address advance care planning and goals of care.  CODE STATUS:DNR  QQP:YPPJ 50%  HOSPICE ELIGIBILITY/DIAGNOSIS: TBD  PAST MEDICAL HISTORY:  Past Medical History:  Diagnosis Date  . A-fib ([facility])   . Anemia   . Anemia of chronic disease 02/02/2020  . Atrial fibrillation ([facility])   . Boerhaave's syndrome   . Chronic anticoagulation 01/22/2020  . Colon polyps   . COVID-19   . Hx of  colonic polyps 01/22/2020   2017 - [place]  . Hypomagnesemia   . Low grade myelodysplastic syndrome lesions ([facility]) 02/02/2020  . Lower urinary tract symptoms due to benign  prostatic hyperplasia   . Pacemaker   . Paroxysmal atrial fibrillation ([facility])   . Prostate enlargement   . Symptomatic anemia 12/26/2019  . Vitamin D deficiency     SOCIAL HX:  Social History   Tobacco Use  . Smoking status: Former Smoker    Types: Cigarettes    Quit date: 01/21/1970    Years since quitting: 50.7  . Smokeless tobacco: Never Used  Substance Use Topics  . Alcohol use: Not Currently    ALLERGIES: No Known Allergies   PERTINENT MEDICATIONS:  Outpatient Encounter Medications as of 09/26/2020  Medication Sig  . apixaban (ELIQUIS) 5 MG TABS tablet Take 1 tablet (5 mg total) by mouth 2 (two) times daily.  . cholecalciferol (VITAMIN D3) 25 MCG (1000 UNIT) tablet Take 1,000 Units by mouth daily.  . cyanocobalamin 1000 MCG tablet Take 1,000 mcg by mouth daily.  Marland Kitchen deferasirox (EXJADE) 500 MG disintegrating tablet Take 2 tablets (1,000 mg total) by mouth daily before breakfast.  . furosemide (LASIX) 20 MG tablet May take 20 -40 mg Daily  . Magnesium 250 MG TABS Take 1 tablet (250 mg total) by mouth daily.  . simvastatin (ZOCOR) 20 MG tablet Take 20 mg by mouth daily.  . traMADol (ULTRAM) 50 MG tablet Take 1 tablet (50 mg total) by mouth every 6 (six) hours as needed.   No facility-administered encounter medications on file as of 09/26/2020.    PHYSICAL EXAM/ROS:  General: NAD, cooperative Cardiovascular: regular rate and rhythm; denies chest pain Pulmonary: clear ant/post fields Abdomen: soft, nontender, + bowel sounds; no diarrhea, no constipation GU: no suprapubic tenderness Extremities: no edema, no joint deformities Skin: no rashes to visible skin Neurological: Weakness but otherwise nonfocal  Note:  Portions of this note were generated with [employer]. Dictation errors may occur despite  attempts at proofreading.  Teodoro Spray, NP

## 2020-09-27 ENCOUNTER — Other Ambulatory Visit: Payer: Medicare Other

## 2020-09-27 ENCOUNTER — Ambulatory Visit: Payer: Medicare Other

## 2020-09-27 ENCOUNTER — Ambulatory Visit: Payer: Medicare Other | Admitting: Hematology and Oncology

## 2020-09-30 ENCOUNTER — Telehealth: Payer: Self-pay | Admitting: Hematology and Oncology

## 2020-09-30 NOTE — Telephone Encounter (Signed)
Called pt per 1/10 sch msg - left message for pt to call back to reschedule appt.

## 2020-10-01 NOTE — Progress Notes (Signed)
 Patient Care Team: Patient, No Pcp Per as PCP - General (General Practice) Nahser, Philip J, MD as PCP - Cardiology (Cardiology)  DIAGNOSIS:    ICD-10-CM   1. Low grade myelodysplastic syndrome lesions (H[facility])  D46.20     CHIEF COMPLIANT: Follow-upof MDS  INTERVAL HISTORY: Ricky [name] is a 87 y.o. with above-mentioned history of MDScurrently receivingAranesp(last 08/23/20) and he received 2 units of PRBCs on 08/26/20 and 09/18/20. He presents to the clinic todayfor follow-up. Today he feels marginally better. He has not been short of breath by taking a few steps. He does not feel dizzy or lightheaded today.  ALLERGIES:  has No Known Allergies.  MEDICATIONS:  Current Outpatient Medications  Medication Sig Dispense Refill  . apixaban (ELIQUIS) 5 MG TABS tablet Take 1 tablet (5 mg total) by mouth 2 (two) times daily. 180 tablet 1  . cholecalciferol (VITAMIN D3) 25 MCG (1000 UNIT) tablet Take 1,000 Units by mouth daily.    . cyanocobalamin 1000 MCG tablet Take 1,000 mcg by mouth daily.    . deferasirox (EXJADE) 500 MG disintegrating tablet Take 2 tablets (1,000 mg total) by mouth daily before breakfast. 60 tablet 3  . furosemide (LASIX) 20 MG tablet May take 20 -40 mg Daily 120 tablet 3  . Magnesium 250 MG TABS Take 1 tablet (250 mg total) by mouth daily.  0  . simvastatin (ZOCOR) 20 MG tablet Take 20 mg by mouth daily.    . traMADol (ULTRAM) 50 MG tablet Take 1 tablet (50 mg total) by mouth every 6 (six) hours as needed. 30 tablet 0   No current facility-administered medications for this visit.    PHYSICAL EXAMINATION: ECOG PERFORMANCE STATUS: 3 - Symptomatic, >50% confined to bed  Vitals:   10/02/20 0950  BP: (!) 117/41  Pulse: 91  Resp: 18  Temp: 97.7 F (36.5 C)  SpO2: 99%   Filed Weights   10/02/20 0950  Weight: 195 lb 4.8 oz (88.6 kg)    LABORATORY DATA:  I have reviewed the data as listed CMP Latest Ref Rng & Units 09/16/2020 08/23/2020 08/09/2020   Glucose 70 - 99 mg/dL 120(H) 107(H) 104(H)  BUN 8 - 23 mg/dL 20 25(H) 24(H)  Creatinine 0.61 - 1.24 mg/dL 1.07 1.29(H) 1.25(H)  Sodium 135 - 145 mmol/L 134(L) 137 137  Potassium 3.5 - 5.1 mmol/L 4.2 4.1 4.4  Chloride 98 - 111 mmol/L 104 104 104  CO2 22 - 32 mmol/L 26 23 25  Calcium 8.9 - 10.3 mg/dL 8.6(L) 8.7(L) 8.5(L)  Total Protein 6.5 - 8.1 g/dL 7.4 7.6 7.9  Total Bilirubin 0.3 - 1.2 mg/dL 1.1 1.1 1.2  Alkaline Phos 38 - 126 U/L 54 62 61  AST 15 - 41 U/L 14(L) 14(L) 20  ALT 0 - 44 U/L 23 21 34    Lab Results  Component Value Date   WBC 3.8 (L) 10/02/2020   HGB 8.0 (L) 10/02/2020   HCT 25.3 (L) 10/02/2020   MCV 91.0 10/02/2020   PLT 157 10/02/2020   NEUTROABS 2.0 10/02/2020    ASSESSMENT & PLAN:  Low grade myelodysplastic syndrome lesions (HCC) Normocytic to macrocytic anemia:  Gastroenterology evaluation: Did not feel like he is a candidate for endoscopies or biopsies. Blood transfusion 02/21/2020, 03/23/20,05/03/2020  Treatment plan:Aranesp every 3 weeks 500 mcg (increased from 300 mcg)  Lab review:  01/29/2020: Hemoglobin 7.8, MCV 103.4, platelets 149 03/28/2020: Hemoglobin is 9 (it was 6.9 on 03/22/2020) 05/23/2020: Hemoglobin 7.1(received blood transfusion) 07/05/2020: Hemoglobin   6.2 (received blood transfusion) 07/26/2020: Hemoglobin 7.3 (received blood transfusion) 09/16/2020: Hemoglobin 7.1 (received blood)  Bone marrow biopsy: Hypercellular bone marrow for age 70 to 60% cellularity with dyspoietic changes associated with abundant ring sideroblasts. No increase in blastic cells identified. 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 1000 mg a day and may increase up to 2000 mg if  needed. On 08/09/2020: Ferritin: 2314. Because of diarrhea he stopped taking it. I instructed his family to resume it and take Imodium as needed.   He'll come back every 2 weeks for labs and blood transfusions will be ordered if needed. I will see him back in 4 weeks.    No orders of the defined types were placed in this encounter.  The patient has a good understanding of the overall plan. he agrees with it. he will call with any problems that may develop before the next visit here.  Total time spent: 20 mins including face to face time and time spent for planning, charting and coordination of care  Nicholas Lose, MD 10/02/2020  I, Cloyde Reams Dorshimer, am acting as scribe for Dr. Nicholas Lose.  I have reviewed the above documentation for accuracy and completeness, and I agree with the above.

## 2020-10-02 ENCOUNTER — Inpatient Hospital Stay (HOSPITAL_BASED_OUTPATIENT_CLINIC_OR_DEPARTMENT_OTHER): Payer: Medicare Other | Admitting: Hematology and Oncology

## 2020-10-02 ENCOUNTER — Other Ambulatory Visit: Payer: Self-pay

## 2020-10-02 ENCOUNTER — Inpatient Hospital Stay: Payer: Medicare Other | Attending: Hematology and Oncology

## 2020-10-02 DIAGNOSIS — D4620 Refractory anemia with excess of blasts, unspecified: Secondary | ICD-10-CM | POA: Diagnosis present

## 2020-10-02 LAB — CBC WITH DIFFERENTIAL (CANCER CENTER ONLY)
Abs Immature Granulocytes: 0.02 10*3/uL (ref 0.00–0.07)
Basophils Absolute: 0 10*3/uL (ref 0.0–0.1)
Basophils Relative: 1 %
Eosinophils Absolute: 0.2 10*3/uL (ref 0.0–0.5)
Eosinophils Relative: 5 %
HCT: 25.3 % — ABNORMAL LOW (ref 39.0–52.0)
Hemoglobin: 8 g/dL — ABNORMAL LOW (ref 13.0–17.0)
Immature Granulocytes: 1 %
Lymphocytes Relative: 23 %
Lymphs Abs: 0.9 10*3/uL (ref 0.7–4.0)
MCH: 28.8 pg (ref 26.0–34.0)
MCHC: 31.6 g/dL (ref 30.0–36.0)
MCV: 91 fL (ref 80.0–100.0)
Monocytes Absolute: 0.7 10*3/uL (ref 0.1–1.0)
Monocytes Relative: 18 %
Neutro Abs: 2 10*3/uL (ref 1.7–7.7)
Neutrophils Relative %: 52 %
Platelet Count: 157 10*3/uL (ref 150–400)
RBC: 2.78 MIL/uL — ABNORMAL LOW (ref 4.22–5.81)
RDW: 22.5 % — ABNORMAL HIGH (ref 11.5–15.5)
WBC Count: 3.8 10*3/uL — ABNORMAL LOW (ref 4.0–10.5)
nRBC: 0 % (ref 0.0–0.2)

## 2020-10-02 LAB — SAMPLE TO BLOOD BANK

## 2020-10-02 NOTE — Assessment & Plan Note (Signed)
Normocytic to macrocytic anemia:  Gastroenterology evaluation: Did not feel like he is a candidate for endoscopies or biopsies. Blood transfusion 02/21/2020, 03/23/20,05/03/2020  Treatment plan:Aranesp every 3 weeks 500 mcg (increased from 300 mcg)  Lab review:  01/29/2020: Hemoglobin 7.8, MCV 103.4, platelets 149 03/28/2020: Hemoglobin is 9 (it was 6.9 on 03/22/2020) 05/23/2020: Hemoglobin 7.1(received blood transfusion) 07/05/2020: Hemoglobin 6.2 (received blood transfusion) 07/26/2020: Hemoglobin 7.3 (received blood transfusion) 09/16/2020: Hemoglobin 7.1 (received blood)  Bone marrow biopsy: Hypercellular bone marrow for age 63 to 60% cellularity with dyspoietic changes associated with abundant ring sideroblasts. No increase in blastic cells identified. 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 1000 mg a day and may increase up to 2000 mg if needed. On 08/09/2020: Ferritin: 2314 We will increase the dosage of Exjade to 2000 mg daily

## 2020-10-04 ENCOUNTER — Ambulatory Visit: Payer: Medicare Other | Admitting: Hematology and Oncology

## 2020-10-04 ENCOUNTER — Other Ambulatory Visit: Payer: Medicare Other

## 2020-10-10 NOTE — Progress Notes (Signed)
Remote pacemaker transmission.   

## 2020-10-15 NOTE — Assessment & Plan Note (Signed)
Gastroenterology evaluation: Did not feel like he is a candidate for endoscopies or biopsies. Blood transfusion 02/21/2020, 03/23/20,05/03/2020  Treatment plan:Aranesp every 3 weeks 500 mcg (increased from 300 mcg)  Lab review:  01/29/2020: Hemoglobin 7.8, MCV 103.4, platelets 149 03/28/2020: Hemoglobin is 9 (it was 6.9 on 03/22/2020) 05/23/2020: Hemoglobin 7.1(received blood transfusion) 07/05/2020: Hemoglobin 6.2 (received blood transfusion) 07/26/2020: Hemoglobin 7.3 (received blood transfusion) 09/16/2020: Hemoglobin 7.1 (received blood) 10/02/2020: Hemoglobin 8 10/15/2020:  Bone marrow biopsy: Hypercellular bone marrow for age 70 to 60% cellularity with dyspoietic changes associated with abundant ring sideroblasts. No increase in blastic cells identified. 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 1000 mg a day

## 2020-10-15 NOTE — Progress Notes (Signed)
Patient Care Team: Patient, No Pcp Per as PCP - General (General Practice) Nahser, Wonda Cheng, MD as PCP - Cardiology (Cardiology)  DIAGNOSIS:    ICD-10-CM   1. Low grade myelodysplastic syndrome lesions ([facility])  D46.20 Sample to Blood Bank    CHIEF COMPLIANT: Follow-upof MDS  INTERVAL HISTORY: Ricky [name] is a 87 y.o. with above-mentioned history of MDScurrently receivingAranesp(last 08/23/20) and he received 2 units of PRBCs on 08/26/20 and 09/18/20. He presents to the clinic todayfor follow-up.  He came today accompanied by his daughter.  He reports that he feels reasonably okay.  He does not feel dizzy or lightheaded.  ALLERGIES:  has No Known Allergies.  MEDICATIONS:  Current Outpatient Medications  Medication Sig Dispense Refill  . apixaban (ELIQUIS) 5 MG TABS tablet Take 1 tablet (5 mg total) by mouth 2 (two) times daily. 180 tablet 1  . cholecalciferol (VITAMIN D3) 25 MCG (1000 UNIT) tablet Take 1,000 Units by mouth daily.    . cyanocobalamin 1000 MCG tablet Take 1,000 mcg by mouth daily.    Marland Kitchen deferasirox (EXJADE) 500 MG disintegrating tablet Take 2 tablets (1,000 mg total) by mouth daily before breakfast. 60 tablet 3  . furosemide (LASIX) 20 MG tablet May take 20 -40 mg Daily 120 tablet 3  . Magnesium 250 MG TABS Take 1 tablet (250 mg total) by mouth daily.  0  . simvastatin (ZOCOR) 20 MG tablet Take 20 mg by mouth daily.    . traMADol (ULTRAM) 50 MG tablet Take 1 tablet (50 mg total) by mouth every 6 (six) hours as needed. 30 tablet 0   No current facility-administered medications for this visit.   Facility-Administered Medications Ordered in Other Visits  Medication Dose Route Frequency Provider Last Rate Last Admin  . Darbepoetin Alfa (ARANESP) injection 500 mcg  500 mcg Subcutaneous Once Nicholas Lose, MD        PHYSICAL EXAMINATION: ECOG PERFORMANCE STATUS: 1 - Symptomatic but completely ambulatory  Vitals:   10/16/20 0825  BP: (!) 123/59  Pulse: 93   Resp: 17  Temp: 97.9 F (36.6 C)  SpO2: 100%   Filed Weights   10/16/20 0825  Weight: 193 lb 6.4 oz (87.7 kg)    LABORATORY DATA:  I have reviewed the data as listed CMP Latest Ref Rng & Units 09/16/2020 08/23/2020 08/09/2020  Glucose 70 - 99 mg/dL 120(H) 107(H) 104(H)  BUN 8 - 23 mg/dL 20 25(H) 24(H)  Creatinine 0.61 - 1.24 mg/dL 1.07 1.29(H) 1.25(H)  Sodium 135 - 145 mmol/L 134(L) 137 137  Potassium 3.5 - 5.1 mmol/L 4.2 4.1 4.4  Chloride 98 - 111 mmol/L 104 104 104  CO2 22 - 32 mmol/L $RemoveB'26 23 25  'gNwKQPoE$ Calcium 8.9 - 10.3 mg/dL 8.6(L) 8.7(L) 8.5(L)  Total Protein 6.5 - 8.1 g/dL 7.4 7.6 7.9  Total Bilirubin 0.3 - 1.2 mg/dL 1.1 1.1 1.2  Alkaline Phos 38 - 126 U/L 54 62 61  AST 15 - 41 U/L 14(L) 14(L) 20  ALT 0 - 44 U/L 23 21 34    Lab Results  Component Value Date   WBC 3.8 (L) 10/16/2020   HGB 6.9 (LL) 10/16/2020   HCT 21.2 (L) 10/16/2020   MCV 89.1 10/16/2020   PLT 165 10/16/2020   NEUTROABS 1.5 (L) 10/16/2020    ASSESSMENT & PLAN:  Low grade myelodysplastic syndrome lesions [facility]) Gastroenterology evaluation: Did not feel like he is a candidate for endoscopies or biopsies. Blood transfusion 02/21/2020, 03/23/20,05/03/2020  Treatment plan:Aranesp every  3 weeks 500 mcg (increased from 300 mcg)  Lab review:  01/29/2020: Hemoglobin 7.8, MCV 103.4, platelets 149 03/28/2020: Hemoglobin is 9 (it was 6.9 on 03/22/2020) 05/23/2020: Hemoglobin 7.1(received blood transfusion) 07/05/2020: Hemoglobin 6.2 (received blood transfusion) 07/26/2020: Hemoglobin 7.3 (received blood transfusion) 09/16/2020: Hemoglobin 7.1 (received blood) 10/02/2020: Hemoglobin 8 10/15/2020: Hemoglobin 6.9 (1 unit)  Bone marrow biopsy: Hypercellular bone marrow for age 68 to 60% cellularity with dyspoietic changes associated with abundant ring sideroblasts. No increase in blastic cells identified. Findings favor MDS with ringed sideroblasts Cytogenetics and FISH: SF3B1 mutation detected (present in 80% of MDS  with ringed sideroblasts) no other mutations  Treatment plan: Aranesp started 02/09/2020 and blood transfusions and supportive care (transfuse only if less than hemoglobin of 7) We are planning on transfusing 1 unit of PRBC tomorrow.  Elevated ferritin: Iron overload from frequent blood transfusions:onExjade. 1000 mg a day  Recheck his labs every 2 weeks I will see him back in 4 weeks with labs and injection appointment.   Orders Placed This Encounter  Procedures  . Sample to Blood Bank    Standing Status:   Standing    Number of Occurrences:   20    Standing Expiration Date:   10/16/2021   The patient has a good understanding of the overall plan. he agrees with it. he will call with any problems that may develop before the next visit here.  Total time spent: 20 mins including face to face time and time spent for planning, charting and coordination of care  Nicholas Lose, MD 10/16/2020  I, Cloyde Reams Dorshimer, am acting as scribe for Dr. Nicholas Lose.  I have reviewed the above documentation for accuracy and completeness, and I agree with the above.

## 2020-10-16 ENCOUNTER — Inpatient Hospital Stay: Payer: Medicare Other

## 2020-10-16 ENCOUNTER — Other Ambulatory Visit: Payer: Self-pay

## 2020-10-16 ENCOUNTER — Inpatient Hospital Stay: Payer: Medicare Other | Admitting: Hematology and Oncology

## 2020-10-16 DIAGNOSIS — D649 Anemia, unspecified: Secondary | ICD-10-CM

## 2020-10-16 DIAGNOSIS — D4620 Refractory anemia with excess of blasts, unspecified: Secondary | ICD-10-CM

## 2020-10-16 LAB — CBC WITH DIFFERENTIAL (CANCER CENTER ONLY)
Abs Immature Granulocytes: 0.01 10*3/uL (ref 0.00–0.07)
Basophils Absolute: 0 10*3/uL (ref 0.0–0.1)
Basophils Relative: 1 %
Eosinophils Absolute: 0.2 10*3/uL (ref 0.0–0.5)
Eosinophils Relative: 5 %
HCT: 21.2 % — ABNORMAL LOW (ref 39.0–52.0)
Hemoglobin: 6.9 g/dL — CL (ref 13.0–17.0)
Immature Granulocytes: 0 %
Lymphocytes Relative: 35 %
Lymphs Abs: 1.3 10*3/uL (ref 0.7–4.0)
MCH: 29 pg (ref 26.0–34.0)
MCHC: 32.5 g/dL (ref 30.0–36.0)
MCV: 89.1 fL (ref 80.0–100.0)
Monocytes Absolute: 0.8 10*3/uL (ref 0.1–1.0)
Monocytes Relative: 20 %
Neutro Abs: 1.5 10*3/uL — ABNORMAL LOW (ref 1.7–7.7)
Neutrophils Relative %: 39 %
Platelet Count: 165 10*3/uL (ref 150–400)
RBC: 2.38 MIL/uL — ABNORMAL LOW (ref 4.22–5.81)
RDW: 23.7 % — ABNORMAL HIGH (ref 11.5–15.5)
WBC Count: 3.8 10*3/uL — ABNORMAL LOW (ref 4.0–10.5)
nRBC: 0 % (ref 0.0–0.2)

## 2020-10-16 LAB — SAMPLE TO BLOOD BANK

## 2020-10-16 LAB — PREPARE RBC (CROSSMATCH)

## 2020-10-16 MED ORDER — DARBEPOETIN ALFA 500 MCG/ML IJ SOSY
PREFILLED_SYRINGE | INTRAMUSCULAR | Status: AC
  Filled 2020-10-16: qty 1

## 2020-10-16 MED ORDER — DARBEPOETIN ALFA 500 MCG/ML IJ SOSY
500.0000 ug | PREFILLED_SYRINGE | Freq: Once | INTRAMUSCULAR | Status: AC
  Administered 2020-10-16: 500 ug via SUBCUTANEOUS

## 2020-10-16 MED FILL — DEFERASIROX 500 MG TBSO: 500 | 30 days supply | Qty: 60 | Fill #2

## 2020-10-16 NOTE — Progress Notes (Signed)
CRITICAL VALUE STICKER  CRITICAL VALUE: Hgb 6.9   MD NOTIFIED: Dr. Lindi Adie   RESPONSE: 1 unit of PRBC's to transfuse.

## 2020-10-16 NOTE — Patient Instructions (Signed)
Darbepoetin Alfa injection What is this medicine? DARBEPOETIN ALFA (dar be POE e tin AL fa) helps your body make more red blood cells. It is used to treat anemia caused by chronic kidney failure and chemotherapy. This medicine may be used for other purposes; ask your health care provider or pharmacist if you have questions. COMMON BRAND NAME(S): Aranesp What should I tell my health care provider before I take this medicine? They need to know if you have any of these conditions:  blood clotting disorders or history of blood clots  cancer patient not on chemotherapy  cystic fibrosis  heart disease, such as angina, heart failure, or a history of a heart attack  hemoglobin level of 12 g/dL or greater  high blood pressure  low levels of folate, iron, or vitamin B12  seizures  an unusual or allergic reaction to darbepoetin, erythropoietin, albumin, hamster proteins, latex, other medicines, foods, dyes, or preservatives  pregnant or trying to get pregnant  breast-feeding How should I use this medicine? This medicine is for injection into a vein or under the skin. It is usually given by a health care professional in a hospital or clinic setting. If you get this medicine at home, you will be taught how to prepare and give this medicine. Use exactly as directed. Take your medicine at regular intervals. Do not take your medicine more often than directed. It is important that you put your used needles and syringes in a special sharps container. Do not put them in a trash can. If you do not have a sharps container, call your pharmacist or healthcare provider to get one. A special MedGuide will be given to you by the pharmacist with each prescription and refill. Be sure to read this information carefully each time. Talk to your pediatrician regarding the use of this medicine in children. While this medicine may be used in children as young as 1 month of age for selected conditions, precautions do  apply. Overdosage: If you think you have taken too much of this medicine contact a poison control center or emergency room at once. NOTE: This medicine is only for you. Do not share this medicine with others. What if I miss a dose? If you miss a dose, take it as soon as you can. If it is almost time for your next dose, take only that dose. Do not take double or extra doses. What may interact with this medicine? Do not take this medicine with any of the following medications:  epoetin alfa This list may not describe all possible interactions. Give your health care provider a list of all the medicines, herbs, non-prescription drugs, or dietary supplements you use. Also tell them if you smoke, drink alcohol, or use illegal drugs. Some items may interact with your medicine. What should I watch for while using this medicine? Your condition will be monitored carefully while you are receiving this medicine. You may need blood work done while you are taking this medicine. This medicine may cause a decrease in vitamin B6. You should make sure that you get enough vitamin B6 while you are taking this medicine. Discuss the foods you eat and the vitamins you take with your health care professional. What side effects may I notice from receiving this medicine? Side effects that you should report to your doctor or health care professional as soon as possible:  allergic reactions like skin rash, itching or hives, swelling of the face, lips, or tongue  breathing problems  changes in   vision  chest pain  confusion, trouble speaking or understanding  feeling faint or lightheaded, falls  high blood pressure  muscle aches or pains  pain, swelling, warmth in the leg  rapid weight gain  severe headaches  sudden numbness or weakness of the face, arm or leg  trouble walking, dizziness, loss of balance or coordination  seizures (convulsions)  swelling of the ankles, feet, hands  unusually weak or  tired Side effects that usually do not require medical attention (report to your doctor or health care professional if they continue or are bothersome):  diarrhea  fever, chills (flu-like symptoms)  headaches  nausea, vomiting  redness, stinging, or swelling at site where injected This list may not describe all possible side effects. Call your doctor for medical advice about side effects. You may report side effects to FDA at 1-800-FDA-1088. Where should I keep my medicine? Keep out of the reach of children. Store in a refrigerator between 2 and 8 degrees C (36 and 46 degrees F). Do not freeze. Do not shake. Throw away any unused portion if using a single-dose vial. Throw away any unused medicine after the expiration date. NOTE: This sheet is a summary. It may not cover all possible information. If you have questions about this medicine, talk to your doctor, pharmacist, or health care provider.  2021 Elsevier/Gold Standard (2017-09-22 16:44:20)  

## 2020-10-17 ENCOUNTER — Inpatient Hospital Stay: Payer: Medicare Other

## 2020-10-17 ENCOUNTER — Other Ambulatory Visit: Payer: Self-pay

## 2020-10-17 DIAGNOSIS — D4620 Refractory anemia with excess of blasts, unspecified: Secondary | ICD-10-CM | POA: Diagnosis not present

## 2020-10-17 DIAGNOSIS — D649 Anemia, unspecified: Secondary | ICD-10-CM

## 2020-10-17 MED ORDER — ACETAMINOPHEN 325 MG PO TABS
ORAL_TABLET | ORAL | Status: AC
  Filled 2020-10-17: qty 2

## 2020-10-17 MED ORDER — SODIUM CHLORIDE 0.9% IV SOLUTION
250.0000 mL | Freq: Once | INTRAVENOUS | Status: AC
  Administered 2020-10-17: 250 mL via INTRAVENOUS
  Filled 2020-10-17: qty 250

## 2020-10-17 MED ORDER — DIPHENHYDRAMINE HCL 25 MG PO CAPS
ORAL_CAPSULE | ORAL | Status: AC
  Filled 2020-10-17: qty 1

## 2020-10-17 MED ORDER — DIPHENHYDRAMINE HCL 25 MG PO CAPS
25.0000 mg | ORAL_CAPSULE | Freq: Once | ORAL | Status: AC
  Administered 2020-10-17: 25 mg via ORAL

## 2020-10-17 MED ORDER — ACETAMINOPHEN 325 MG PO TABS
650.0000 mg | ORAL_TABLET | Freq: Once | ORAL | Status: AC
  Administered 2020-10-17: 650 mg via ORAL

## 2020-10-17 NOTE — Patient Instructions (Signed)

## 2020-10-18 ENCOUNTER — Ambulatory Visit: Payer: Medicare Other

## 2020-10-18 ENCOUNTER — Ambulatory Visit: Payer: Medicare Other | Admitting: Hematology and Oncology

## 2020-10-18 ENCOUNTER — Other Ambulatory Visit: Payer: Medicare Other

## 2020-10-18 LAB — TYPE AND SCREEN
ABO/RH(D): B POS
Antibody Screen: POSITIVE
DAT, IgG: POSITIVE
Donor AG Type: NEGATIVE
Unit division: 0

## 2020-10-18 LAB — BPAM RBC
Blood Product Expiration Date: 202202162359
ISSUE DATE / TIME: 202201271043
Unit Type and Rh: 7300

## 2020-11-01 ENCOUNTER — Telehealth: Payer: Self-pay | Admitting: *Deleted

## 2020-11-01 ENCOUNTER — Inpatient Hospital Stay: Payer: Medicare Other | Attending: Hematology and Oncology

## 2020-11-01 ENCOUNTER — Ambulatory Visit: Payer: Medicare Other | Admitting: Hematology and Oncology

## 2020-11-01 ENCOUNTER — Other Ambulatory Visit: Payer: Self-pay

## 2020-11-01 ENCOUNTER — Other Ambulatory Visit: Payer: Self-pay | Admitting: *Deleted

## 2020-11-01 DIAGNOSIS — D649 Anemia, unspecified: Secondary | ICD-10-CM

## 2020-11-01 DIAGNOSIS — D4620 Refractory anemia with excess of blasts, unspecified: Secondary | ICD-10-CM | POA: Insufficient documentation

## 2020-11-01 LAB — CBC WITH DIFFERENTIAL (CANCER CENTER ONLY)
Abs Immature Granulocytes: 0.03 10*3/uL (ref 0.00–0.07)
Basophils Absolute: 0 10*3/uL (ref 0.0–0.1)
Basophils Relative: 0 %
Eosinophils Absolute: 0.2 10*3/uL (ref 0.0–0.5)
Eosinophils Relative: 4 %
HCT: 19.4 % — ABNORMAL LOW (ref 39.0–52.0)
Hemoglobin: 6.5 g/dL — CL (ref 13.0–17.0)
Immature Granulocytes: 1 %
Lymphocytes Relative: 26 %
Lymphs Abs: 1.1 10*3/uL (ref 0.7–4.0)
MCH: 29.4 pg (ref 26.0–34.0)
MCHC: 33.5 g/dL (ref 30.0–36.0)
MCV: 87.8 fL (ref 80.0–100.0)
Monocytes Absolute: 0.9 10*3/uL (ref 0.1–1.0)
Monocytes Relative: 20 %
Neutro Abs: 2.1 10*3/uL (ref 1.7–7.7)
Neutrophils Relative %: 49 %
Platelet Count: 144 10*3/uL — ABNORMAL LOW (ref 150–400)
RBC: 2.21 MIL/uL — ABNORMAL LOW (ref 4.22–5.81)
RDW: 22.9 % — ABNORMAL HIGH (ref 11.5–15.5)
WBC Count: 4.3 10*3/uL (ref 4.0–10.5)
nRBC: 0 % (ref 0.0–0.2)

## 2020-11-01 LAB — SAMPLE TO BLOOD BANK

## 2020-11-01 LAB — PREPARE RBC (CROSSMATCH)

## 2020-11-01 NOTE — Telephone Encounter (Signed)
Received critical value from lab, hgb 6.5.  Call received at 0928.  Message given to Jesse Fall, RN with Dr Lindi Adie at [phone].

## 2020-11-02 ENCOUNTER — Inpatient Hospital Stay: Payer: Medicare Other

## 2020-11-02 ENCOUNTER — Other Ambulatory Visit: Payer: Self-pay

## 2020-11-02 DIAGNOSIS — D649 Anemia, unspecified: Secondary | ICD-10-CM

## 2020-11-02 DIAGNOSIS — D4620 Refractory anemia with excess of blasts, unspecified: Secondary | ICD-10-CM | POA: Diagnosis not present

## 2020-11-02 MED ORDER — DIPHENHYDRAMINE HCL 25 MG PO CAPS
25.0000 mg | ORAL_CAPSULE | Freq: Once | ORAL | Status: AC
  Administered 2020-11-02: 25 mg via ORAL

## 2020-11-02 MED ORDER — SODIUM CHLORIDE 0.9% IV SOLUTION
250.0000 mL | Freq: Once | INTRAVENOUS | Status: DC
  Filled 2020-11-02: qty 250

## 2020-11-02 MED ORDER — DIPHENHYDRAMINE HCL 25 MG PO CAPS
ORAL_CAPSULE | ORAL | Status: AC
  Filled 2020-11-02: qty 1

## 2020-11-02 MED ORDER — ACETAMINOPHEN 325 MG PO TABS
ORAL_TABLET | ORAL | Status: AC
  Filled 2020-11-02: qty 2

## 2020-11-02 MED ORDER — ACETAMINOPHEN 325 MG PO TABS
650.0000 mg | ORAL_TABLET | Freq: Once | ORAL | Status: AC
  Administered 2020-11-02: 650 mg via ORAL

## 2020-11-02 NOTE — Patient Instructions (Signed)

## 2020-11-03 LAB — TYPE AND SCREEN
ABO/RH(D): B POS
Antibody Screen: POSITIVE
DAT, IgG: POSITIVE
Donor AG Type: NEGATIVE
Donor AG Type: NEGATIVE
Unit division: 0
Unit division: 0

## 2020-11-03 LAB — BPAM RBC
Blood Product Expiration Date: 202203122359
Blood Product Expiration Date: 202203202359
ISSUE DATE / TIME: 202202120859
ISSUE DATE / TIME: 202202120859
Unit Type and Rh: 7300
Unit Type and Rh: 7300

## 2020-11-10 ENCOUNTER — Other Ambulatory Visit: Payer: Self-pay | Admitting: Internal Medicine

## 2020-11-13 ENCOUNTER — Other Ambulatory Visit: Payer: Medicare Other

## 2020-11-14 NOTE — Progress Notes (Signed)
Patient Care Team: Patient, No Pcp Per as PCP - General (General Practice) Nahser, Wonda Cheng, MD as PCP - Cardiology (Cardiology)  DIAGNOSIS:    ICD-10-CM   1. Low grade myelodysplastic syndrome lesions ([place])  D46.20     CHIEF COMPLIANT: Follow-upof MDS  INTERVAL HISTORY: Ricky [name] is a 87 y.o. with above-mentioned history of MDScurrently receivingAranesp(last 10/16/20) and he received 2 units of PRBCs on 11/02/20. He presents to the clinic todayfor follow-up. He reports to me that he has no new symptoms or concerns.  He still feels chronically weak and fatigued.  He is able to walk with the help of a walker.  Today his hemoglobin is 7.9 and he does not need blood transfusion.  ALLERGIES:  has No Known Allergies.  MEDICATIONS:  Current Outpatient Medications  Medication Sig Dispense Refill  . apixaban (ELIQUIS) 5 MG TABS tablet Take 1 tablet (5 mg total) by mouth 2 (two) times daily. 180 tablet 1  . cholecalciferol (VITAMIN D3) 25 MCG (1000 UNIT) tablet Take 1,000 Units by mouth daily.    . cyanocobalamin 1000 MCG tablet Take 1,000 mcg by mouth daily.    Marland Kitchen deferasirox (EXJADE) 500 MG disintegrating tablet Take 2 tablets (1,000 mg total) by mouth daily before breakfast. 60 tablet 3  . furosemide (LASIX) 20 MG tablet TAKE 1 TO 2 TABLETS BY MOUTH DAILY 180 tablet 2  . Magnesium 250 MG TABS Take 1 tablet (250 mg total) by mouth daily.  0  . simvastatin (ZOCOR) 20 MG tablet Take 20 mg by mouth daily.    . traMADol (ULTRAM) 50 MG tablet Take 1 tablet (50 mg total) by mouth every 6 (six) hours as needed. 30 tablet 0   No current facility-administered medications for this visit.    PHYSICAL EXAMINATION: ECOG PERFORMANCE STATUS: 1 - Symptomatic but completely ambulatory  Vitals:   11/15/20 0831  BP: (!) 127/55  Pulse: 87  Resp: 18  Temp: (!) 97.5 F (36.4 C)  SpO2: 98%   Filed Weights   11/15/20 0831  Weight: 197 lb 9.6 oz (89.6 kg)    LABORATORY DATA:  I have  reviewed the data as listed CMP Latest Ref Rng & Units 09/16/2020 08/23/2020 08/09/2020  Glucose 70 - 99 mg/dL 120(H) 107(H) 104(H)  BUN 8 - 23 mg/dL 20 25(H) 24(H)  Creatinine 0.61 - 1.24 mg/dL 1.07 1.29(H) 1.25(H)  Sodium 135 - 145 mmol/L 134(L) 137 137  Potassium 3.5 - 5.1 mmol/L 4.2 4.1 4.4  Chloride 98 - 111 mmol/L 104 104 104  CO2 22 - 32 mmol/L $RemoveB'26 23 25  'UACfvnxt$ Calcium 8.9 - 10.3 mg/dL 8.6(L) 8.7(L) 8.5(L)  Total Protein 6.5 - 8.1 g/dL 7.4 7.6 7.9  Total Bilirubin 0.3 - 1.2 mg/dL 1.1 1.1 1.2  Alkaline Phos 38 - 126 U/L 54 62 61  AST 15 - 41 U/L 14(L) 14(L) 20  ALT 0 - 44 U/L 23 21 34    Lab Results  Component Value Date   WBC 5.7 11/15/2020   HGB 7.9 (L) 11/15/2020   HCT 24.3 (L) 11/15/2020   MCV 89.7 11/15/2020   PLT 137 (L) 11/15/2020   NEUTROABS 3.1 11/15/2020    ASSESSMENT & PLAN:  Low grade myelodysplastic syndrome lesions [facility]) Gastroenterology evaluation: Did not feel like he is a candidate for endoscopies or biopsies. Blood transfusion 02/21/2020, 03/23/20,05/03/2020  Treatment plan:Aranesp every 3 weeks 500 mcg (increased from 300 mcg)  Lab review:  01/29/2020: Hemoglobin 7.8, MCV 103.4, platelets 149 03/28/2020:  Hemoglobin is 9 (it was 6.9 on 03/22/2020) 05/23/2020: Hemoglobin 7.1(received blood transfusion) 07/05/2020: Hemoglobin 6.2 (received blood transfusion) 07/26/2020: Hemoglobin 7.3 (received blood transfusion) 09/16/2020: Hemoglobin 7.1 (received blood) 10/02/2020: Hemoglobin 8 10/15/2020: Hemoglobin 6.9 (1 unit) 11/15/2020: Hemoglobin 7.9  Bone marrow biopsy: Hypercellular bone marrow for age 42 to 60% cellularity with dyspoietic changes associated with abundant ring sideroblasts. No increase in blastic cells identified. Findings favor MDS with ringed sideroblasts Cytogenetics and FISH: SF3B1 mutation detected (present in 80% of MDS with ringed sideroblasts) no other mutations  Treatment plan: Aranespstarted 5/21/2021and blood transfusions and supportive  care (transfuse only if less than hemoglobin of 7)  Elevated ferritin: Iron overload from frequent blood transfusions:onExjade. 1000 mg a day  Recheck his labs every 2 weeks I will see him back in 4 weeks with labs and injection appointment.    No orders of the defined types were placed in this encounter.  The patient has a good understanding of the overall plan. he agrees with it. he will call with any problems that may develop before the next visit here.  Total time spent: 30 mins including face to face time and time spent for planning, charting and coordination of care  Rulon Eisenmenger, MD, MPH 11/15/2020  I, Molly Dorshimer, am acting as scribe for Dr. Nicholas Lose.  I have reviewed the above documentation for accuracy and completeness, and I agree with the above.

## 2020-11-14 NOTE — Assessment & Plan Note (Signed)
Gastroenterology evaluation: Did not feel like he is a candidate for endoscopies or biopsies. Blood transfusion 02/21/2020, 03/23/20,05/03/2020  Treatment plan:Aranesp every 3 weeks 500 mcg (increased from 300 mcg)  Lab review:  01/29/2020: Hemoglobin 7.8, MCV 103.4, platelets 149 03/28/2020: Hemoglobin is 9 (it was 6.9 on 03/22/2020) 05/23/2020: Hemoglobin 7.1(received blood transfusion) 07/05/2020: Hemoglobin 6.2 (received blood transfusion) 07/26/2020: Hemoglobin 7.3 (received blood transfusion) 09/16/2020: Hemoglobin 7.1 (received blood) 10/02/2020: Hemoglobin 8 10/15/2020: Hemoglobin 6.9 (1 unit)  Bone marrow biopsy: Hypercellular bone marrow for age 68 to 60% cellularity with dyspoietic changes associated with abundant ring sideroblasts. No increase in blastic cells identified. Findings favor MDS with ringed sideroblasts Cytogenetics and FISH: SF3B1 mutation detected (present in 80% of MDS with ringed sideroblasts) no other mutations  Treatment plan: Aranespstarted 5/21/2021and blood transfusions and supportive care (transfuse only if less than hemoglobin of 7)  Elevated ferritin: Iron overload from frequent blood transfusions:onExjade. 1000 mg a day  Recheck his labs every 2 weeks I will see him back in 4 weeks with labs and injection appointment.

## 2020-11-15 ENCOUNTER — Inpatient Hospital Stay: Payer: Medicare Other

## 2020-11-15 ENCOUNTER — Inpatient Hospital Stay: Payer: Medicare Other | Admitting: Hematology and Oncology

## 2020-11-15 ENCOUNTER — Other Ambulatory Visit: Payer: Medicare Other

## 2020-11-15 ENCOUNTER — Ambulatory Visit: Payer: Medicare Other

## 2020-11-15 ENCOUNTER — Other Ambulatory Visit: Payer: Self-pay

## 2020-11-15 ENCOUNTER — Telehealth: Payer: Self-pay | Admitting: Hematology and Oncology

## 2020-11-15 DIAGNOSIS — D4620 Refractory anemia with excess of blasts, unspecified: Secondary | ICD-10-CM

## 2020-11-15 LAB — CBC WITH DIFFERENTIAL (CANCER CENTER ONLY)
Abs Immature Granulocytes: 0.02 10*3/uL (ref 0.00–0.07)
Basophils Absolute: 0 10*3/uL (ref 0.0–0.1)
Basophils Relative: 0 %
Eosinophils Absolute: 0.2 10*3/uL (ref 0.0–0.5)
Eosinophils Relative: 4 %
HCT: 24.3 % — ABNORMAL LOW (ref 39.0–52.0)
Hemoglobin: 7.9 g/dL — ABNORMAL LOW (ref 13.0–17.0)
Immature Granulocytes: 0 %
Lymphocytes Relative: 23 %
Lymphs Abs: 1.3 10*3/uL (ref 0.7–4.0)
MCH: 29.2 pg (ref 26.0–34.0)
MCHC: 32.5 g/dL (ref 30.0–36.0)
MCV: 89.7 fL (ref 80.0–100.0)
Monocytes Absolute: 1.1 10*3/uL — ABNORMAL HIGH (ref 0.1–1.0)
Monocytes Relative: 19 %
Neutro Abs: 3.1 10*3/uL (ref 1.7–7.7)
Neutrophils Relative %: 54 %
Platelet Count: 137 10*3/uL — ABNORMAL LOW (ref 150–400)
RBC: 2.71 MIL/uL — ABNORMAL LOW (ref 4.22–5.81)
RDW: 21.2 % — ABNORMAL HIGH (ref 11.5–15.5)
WBC Count: 5.7 10*3/uL (ref 4.0–10.5)
nRBC: 0.3 % — ABNORMAL HIGH (ref 0.0–0.2)

## 2020-11-15 LAB — SAMPLE TO BLOOD BANK

## 2020-11-15 MED ORDER — DARBEPOETIN ALFA 500 MCG/ML IJ SOSY
PREFILLED_SYRINGE | INTRAMUSCULAR | Status: AC
  Filled 2020-11-15: qty 1

## 2020-11-15 MED ORDER — DARBEPOETIN ALFA 500 MCG/ML IJ SOSY
500.0000 ug | PREFILLED_SYRINGE | Freq: Once | INTRAMUSCULAR | Status: AC
  Administered 2020-11-15: 500 ug via SUBCUTANEOUS

## 2020-11-15 NOTE — Telephone Encounter (Signed)
Scheduled appts per 2/25 los. Gave pt a print out of AVS.

## 2020-11-22 ENCOUNTER — Observation Stay (HOSPITAL_COMMUNITY)
Admission: EM | Admit: 2020-11-22 | Discharge: 2020-11-23 | Disposition: A | Discharge status: Home / Self Care | Payer: Medicare Other | Attending: Internal Medicine | Admitting: Internal Medicine

## 2020-11-22 ENCOUNTER — Ambulatory Visit (HOSPITAL_COMMUNITY)
Admission: EM | Admit: 2020-11-22 | Discharge: 2020-11-22 | Disposition: A | Discharge status: Home / Self Care | Payer: Medicare Other

## 2020-11-22 ENCOUNTER — Other Ambulatory Visit: Payer: Self-pay

## 2020-11-22 DIAGNOSIS — Z96652 Presence of left artificial knee joint: Secondary | ICD-10-CM | POA: Insufficient documentation

## 2020-11-22 DIAGNOSIS — Z20822 Contact with and (suspected) exposure to covid-19: Secondary | ICD-10-CM | POA: Insufficient documentation

## 2020-11-22 DIAGNOSIS — I4891 Unspecified atrial fibrillation: Secondary | ICD-10-CM | POA: Diagnosis present

## 2020-11-22 DIAGNOSIS — I480 Paroxysmal atrial fibrillation: Secondary | ICD-10-CM | POA: Diagnosis not present

## 2020-11-22 DIAGNOSIS — Z8616 Personal history of COVID-19: Secondary | ICD-10-CM | POA: Diagnosis not present

## 2020-11-22 DIAGNOSIS — N401 Enlarged prostate with lower urinary tract symptoms: Secondary | ICD-10-CM | POA: Diagnosis not present

## 2020-11-22 DIAGNOSIS — N138 Other obstructive and reflux uropathy: Secondary | ICD-10-CM | POA: Insufficient documentation

## 2020-11-22 DIAGNOSIS — N179 Acute kidney failure, unspecified: Principal | ICD-10-CM | POA: Diagnosis present

## 2020-11-22 DIAGNOSIS — Z7901 Long term (current) use of anticoagulants: Secondary | ICD-10-CM | POA: Insufficient documentation

## 2020-11-22 DIAGNOSIS — D649 Anemia, unspecified: Secondary | ICD-10-CM | POA: Diagnosis not present

## 2020-11-22 DIAGNOSIS — R3912 Poor urinary stream: Secondary | ICD-10-CM | POA: Diagnosis present

## 2020-11-22 DIAGNOSIS — Z96641 Presence of right artificial hip joint: Secondary | ICD-10-CM | POA: Insufficient documentation

## 2020-11-22 DIAGNOSIS — Z87891 Personal history of nicotine dependence: Secondary | ICD-10-CM | POA: Insufficient documentation

## 2020-11-22 DIAGNOSIS — D638 Anemia in other chronic diseases classified elsewhere: Secondary | ICD-10-CM | POA: Diagnosis present

## 2020-11-22 DIAGNOSIS — D696 Thrombocytopenia, unspecified: Secondary | ICD-10-CM | POA: Diagnosis present

## 2020-11-22 DIAGNOSIS — Z950 Presence of cardiac pacemaker: Secondary | ICD-10-CM | POA: Diagnosis not present

## 2020-11-22 DIAGNOSIS — I2510 Atherosclerotic heart disease of native coronary artery without angina pectoris: Secondary | ICD-10-CM | POA: Diagnosis present

## 2020-11-22 LAB — BASIC METABOLIC PANEL
Anion gap: 9 (ref 5–15)
BUN: 24 mg/dL — ABNORMAL HIGH (ref 8–23)
CO2: 21 mmol/L — ABNORMAL LOW (ref 22–32)
Calcium: 8.9 mg/dL (ref 8.9–10.3)
Chloride: 104 mmol/L (ref 98–111)
Creatinine, Ser: 1.41 mg/dL — ABNORMAL HIGH (ref 0.61–1.24)
GFR, Estimated: 48 mL/min — ABNORMAL LOW (ref >60)
Glucose, Bld: 110 mg/dL — ABNORMAL HIGH (ref 70–99)
Potassium: 3.7 mmol/L (ref 3.5–5.1)
Sodium: 134 mmol/L — ABNORMAL LOW (ref 135–145)

## 2020-11-22 LAB — CBC WITH DIFFERENTIAL/PLATELET
Abs Immature Granulocytes: 0.05 10*3/uL (ref 0.00–0.07)
Basophils Absolute: 0 10*3/uL (ref 0.0–0.1)
Basophils Relative: 0 %
Eosinophils Absolute: 0.1 10*3/uL (ref 0.0–0.5)
Eosinophils Relative: 1 %
HCT: 21.9 % — ABNORMAL LOW (ref 39.0–52.0)
Hemoglobin: 6.8 g/dL — CL (ref 13.0–17.0)
Immature Granulocytes: 1 %
Lymphocytes Relative: 12 %
Lymphs Abs: 1.1 10*3/uL (ref 0.7–4.0)
MCH: 28.9 pg (ref 26.0–34.0)
MCHC: 31.1 g/dL (ref 30.0–36.0)
MCV: 93.2 fL (ref 80.0–100.0)
Monocytes Absolute: 1.9 10*3/uL — ABNORMAL HIGH (ref 0.1–1.0)
Monocytes Relative: 20 %
Neutro Abs: 6.4 10*3/uL (ref 1.7–7.7)
Neutrophils Relative %: 66 %
Platelets: 138 10*3/uL — ABNORMAL LOW (ref 150–400)
RBC: 2.35 MIL/uL — ABNORMAL LOW (ref 4.22–5.81)
RDW: 22.5 % — ABNORMAL HIGH (ref 11.5–15.5)
WBC: 9.4 10*3/uL (ref 4.0–10.5)
nRBC: 0 % (ref 0.0–0.2)

## 2020-11-22 LAB — RESP PANEL BY RT-PCR (FLU A&B, COVID) ARPGX2
Influenza A by PCR: NEGATIVE
Influenza B by PCR: NEGATIVE
SARS Coronavirus 2 by RT PCR: NEGATIVE

## 2020-11-22 LAB — PREPARE RBC (CROSSMATCH)

## 2020-11-22 MED ORDER — ACETAMINOPHEN 325 MG PO TABS: 650.0000 mg | ORAL_TABLET | Freq: Four times a day (QID) | ORAL | Status: DC | PRN

## 2020-11-22 MED ORDER — APIXABAN 5 MG PO TABS
5.0000 mg | ORAL_TABLET | Freq: Two times a day (BID) | ORAL | Status: DC
  Administered 2020-11-23 (×2): 5 mg via ORAL
  Filled 2020-11-22 (×2): qty 1

## 2020-11-22 MED ORDER — SODIUM CHLORIDE 0.9 % IV BOLUS
1000.0000 mL | Freq: Once | INTRAVENOUS | Status: AC
  Administered 2020-11-22: 1000 mL via INTRAVENOUS

## 2020-11-22 MED ORDER — DOCUSATE SODIUM 283 MG RE ENEM: 1.0000 | ENEMA | RECTAL | Status: DC | PRN

## 2020-11-22 MED ORDER — CAMPHOR-MENTHOL 0.5-0.5 % EX LOTN: 1.0000 "application " | TOPICAL_LOTION | Freq: Three times a day (TID) | CUTANEOUS | Status: DC | PRN

## 2020-11-22 MED ORDER — ACETAMINOPHEN 650 MG RE SUPP: 650.0000 mg | Freq: Four times a day (QID) | RECTAL | Status: DC | PRN

## 2020-11-22 MED ORDER — ZOLPIDEM TARTRATE 5 MG PO TABS: 5.0000 mg | ORAL_TABLET | Freq: Every evening | ORAL | Status: DC | PRN

## 2020-11-22 MED ORDER — CALCIUM CARBONATE ANTACID 1250 MG/5ML PO SUSP: 500.0000 mg | Freq: Four times a day (QID) | ORAL | Status: DC | PRN

## 2020-11-22 MED ORDER — SODIUM CHLORIDE 0.9% IV SOLUTION: Freq: Once | INTRAVENOUS | Status: AC

## 2020-11-22 MED ORDER — ONDANSETRON HCL 4 MG PO TABS: 4.0000 mg | ORAL_TABLET | Freq: Four times a day (QID) | ORAL | Status: DC | PRN

## 2020-11-22 MED ORDER — SORBITOL 70 % SOLN
30.0000 mL | Status: DC | PRN
  Filled 2020-11-22: qty 30

## 2020-11-22 MED ORDER — HYDROXYZINE HCL 25 MG PO TABS: 25.0000 mg | ORAL_TABLET | Freq: Three times a day (TID) | ORAL | Status: DC | PRN

## 2020-11-22 MED ORDER — ONDANSETRON HCL 4 MG/2ML IJ SOLN: 4.0000 mg | Freq: Four times a day (QID) | INTRAMUSCULAR | Status: DC | PRN

## 2020-11-22 MED ORDER — NEPRO/CARBSTEADY PO LIQD
237.0000 mL | Freq: Three times a day (TID) | ORAL | Status: DC | PRN
  Filled 2020-11-22: qty 237

## 2020-11-22 MED ORDER — SODIUM CHLORIDE 0.9 % IV SOLN: INTRAVENOUS | Status: DC

## 2020-11-22 NOTE — ED Notes (Signed)
Located patient in lobby. Patient is hard of hearing and did not hear calls for VS.

## 2020-11-22 NOTE — ED Provider Notes (Signed)
[facility] EMERGENCY DEPARTMENT Provider Note   CSN: [id number] Arrival date & time: 11/22/20  1312     History No chief complaint on file.   Ricky [name] is a 87 y.o. male.  87 yo M with decreasing urine output.  Going on for past couple days.  Feels like he cant urinate.  Denies abdominal pain, dysuria.    The history is provided by the patient.  Illness Severity:  Moderate Onset quality:  Gradual Duration:  2 days Timing:  Constant Progression:  Worsening Chronicity:  New Associated symptoms: no abdominal pain, no chest pain, no congestion, no diarrhea, no fever, no headaches, no myalgias, no rash, no shortness of breath and no vomiting        Past Medical History:  Diagnosis Date  . A-fib ([place])   . Anemia   . Anemia of chronic disease 02/02/2020  . Atrial fibrillation ([place])   . Boerhaave's syndrome   . Chronic anticoagulation 01/22/2020  . Colon polyps   . COVID-19   . Hx of colonic polyps 01/22/2020   2017 - [place]  . Hypomagnesemia   . Low grade myelodysplastic syndrome lesions ([place]) 02/02/2020  . Lower urinary tract symptoms due to benign prostatic hyperplasia   . Pacemaker   . Paroxysmal atrial fibrillation ([facility])   . Prostate enlargement   . Symptomatic anemia 12/26/2019  . Vitamin D deficiency     Patient Active Problem List   Diagnosis Date Noted  . Benign neoplasm of trunk 07/05/2020  . Malignant melanoma of skin of upper limb, including shoulder ([place]) 07/05/2020  . Actinic keratosis 07/05/2020  . Basal cell carcinoma of scalp 07/05/2020  . Benign neoplasm of skin of face 07/05/2020  . BPH with urinary obstruction 07/05/2020  . History of malignant melanoma 07/05/2020  . Knee joint replacement status, left 07/05/2020  . Neoplasm of uncertain behavior of skin 07/05/2020  . Sinus node dysfunction ([place]) 03/28/2020  . Pacemaker 03/28/2020  . Anemia of chronic disease 02/02/2020  . Low grade myelodysplastic syndrome lesions ([place])  02/02/2020  . Atrial fibrillation ([place]) 01/22/2020  . Chronic anticoagulation 01/22/2020  . Hx of colonic polyps 01/22/2020  . Symptomatic anemia 12/26/2019  . Coronary artery disease involving native coronary artery 08/24/2015  . Hyperlipidemia 08/24/2015  . Primary osteoarthritis of left knee 08/08/2015  . Boerhaave syndrome 01/27/2006    Past Surgical History:  Procedure Laterality Date  . esophogeal tear repair    . PACEMAKER INSERTION  2009, 2017  . THORACOTOMY     esophageal perforation  . TOTAL HIP ARTHROPLASTY Right   . TOTAL KNEE ARTHROPLASTY Left        Family History  Problem Relation Age of Onset  . Cirrhosis Mother   . Alcoholism Mother   . Heart attack Father 61  . Heart disease Brother   . Diabetes Brother   . Brain cancer Daughter     Social History   Tobacco Use  . Smoking status: Former Smoker    Types: Cigarettes    Quit date: 01/21/1970    Years since quitting: 50.8  . Smokeless tobacco: Never Used  Vaping Use  . Vaping Use: Never used  Substance Use Topics  . Alcohol use: Not Currently  . Drug use: Never    Home Medications Prior to Admission medications   Medication Sig Start Date End Date Taking? Authorizing Provider  apixaban (ELIQUIS) 5 MG TABS tablet Take 1 tablet (5 mg total) by mouth 2 (two) times  daily. 02/29/20   Nahser, Wonda Cheng, MD  cholecalciferol (VITAMIN D3) 25 MCG (1000 UNIT) tablet Take 1,000 Units by mouth daily.    [provider]  cyanocobalamin 1000 MCG tablet Take 1,000 mcg by mouth daily.    [provider]  deferasirox (EXJADE) 500 MG disintegrating tablet Take 2 tablets (1,000 mg total) by mouth daily before breakfast. 07/26/20   Nicholas Lose, MD  furosemide (LASIX) 20 MG tablet TAKE 1 TO 2 TABLETS BY MOUTH DAILY 11/12/20   Nahser, Wonda Cheng, MD  Magnesium 250 MG TABS Take 1 tablet (250 mg total) by mouth daily. 12/27/19   Barb Merino, MD  simvastatin (ZOCOR) 20 MG tablet Take 20 mg by mouth daily.     [provider]  traMADol (ULTRAM) 50 MG tablet Take 1 tablet (50 mg total) by mouth every 6 (six) hours as needed. 09/16/20   Harle Stanford., PA-C    Allergies    Patient has no known allergies.  Review of Systems   Review of Systems  Constitutional: Negative for chills and fever.  HENT: Negative for congestion and facial swelling.   Eyes: Negative for discharge and visual disturbance.  Respiratory: Negative for shortness of breath.   Cardiovascular: Negative for chest pain and palpitations.  Gastrointestinal: Negative for abdominal pain, diarrhea and vomiting.  Musculoskeletal: Negative for arthralgias and myalgias.  Skin: Negative for color change and rash.  Neurological: Negative for tremors, syncope and headaches.  Psychiatric/Behavioral: Negative for confusion and dysphoric mood.    Physical Exam Updated Vital Signs BP 122/60   Pulse 88   Temp 97.8 F (36.6 C) (Oral)   Resp (!) 24   SpO2 97%   Physical Exam Vitals and nursing note reviewed.  Constitutional:      Appearance: He is well-developed and well-nourished.  HENT:     Head: Normocephalic and atraumatic.  Eyes:     Extraocular Movements: EOM normal.     Pupils: Pupils are equal, round, and reactive to light.  Neck:     Vascular: No JVD.  Cardiovascular:     Rate and Rhythm: Normal rate and regular rhythm.     Heart sounds: No murmur heard. No friction rub. No gallop.   Pulmonary:     Effort: No respiratory distress.     Breath sounds: No wheezing.  Abdominal:     General: There is no distension.     Tenderness: There is no abdominal tenderness. There is no guarding or rebound.     Comments: No significant pain to the suprapubic region.   Musculoskeletal:        General: Normal range of motion.     Cervical back: Normal range of motion and neck supple.  Skin:    Coloration: Skin is not pale.     Findings: No rash.  Neurological:     Mental Status: He is alert and oriented to person,  place, and time.  Psychiatric:        Mood and Affect: Mood and affect normal.        Behavior: Behavior normal.     ED Results / Procedures / Treatments   Labs (all labs ordered are listed, but only abnormal results are displayed) Labs Reviewed  CBC WITH DIFFERENTIAL/PLATELET - Abnormal; Notable for the following components:      Result Value   RBC 2.35 (*)    Hemoglobin 6.8 (*)    HCT 21.9 (*)    RDW 22.5 (*)    Platelets  138 (*)    Monocytes Absolute 1.9 (*)    All other components within normal limits  BASIC METABOLIC PANEL - Abnormal; Notable for the following components:   Sodium 134 (*)    CO2 21 (*)    Glucose, Bld 110 (*)    BUN 24 (*)    Creatinine, Ser 1.41 (*)    GFR, Estimated 48 (*)    All other components within normal limits  RESP PANEL BY RT-PCR (FLU A&B, COVID) ARPGX2  URINALYSIS, ROUTINE W REFLEX MICROSCOPIC  PREPARE RBC (CROSSMATCH)  TYPE AND SCREEN    EKG None  Radiology No results found.  Procedures Procedures   Medications Ordered in ED Medications  0.9 %  sodium chloride infusion (Manually program via Guardrails IV Fluids) (has no administration in time range)  sodium chloride 0.9 % bolus 1,000 mL (1,000 mLs Intravenous New Bag/Given 11/22/20 2132)    ED Course  I have reviewed the triage vital signs and the nursing notes.  Pertinent labs & imaging results that were available during my care of the patient were reviewed by me and considered in my medical decision making (see chart for details).    MDM Rules/Calculators/A&P                          87 yo M with a cc of difficulty urinating.  Will bladder scan.   Bladder scan with just over 100 cc of urine.  Will give a bolus of IV fluids check lab work.  Patient's hemoglobin 6.8.  This appears to be a chronic problem for him.  Is required multiple blood transfusions in the past. Denies dark stool or blood in the stool. With him having significant decreased urine output in the past  couple days and a bump in his renal function will discuss with the hospitalist for possible admission.  CRITICAL CARE Performed by: Cecilio Asper   Total critical care time: 35 minutes  Critical care time was exclusive of separately billable procedures and treating other patients.  Critical care was necessary to treat or prevent imminent or life-threatening deterioration.  Critical care was time spent personally by me on the following activities: development of treatment plan with patient and/or surrogate as well as nursing, discussions with consultants, evaluation of patient's response to treatment, examination of patient, obtaining history from patient or surrogate, ordering and performing treatments and interventions, ordering and review of laboratory studies, ordering and review of radiographic studies, pulse oximetry and re-evaluation of patient's condition.  The patients results and plan were reviewed and discussed.   Any x-rays performed were independently reviewed by myself.   Differential diagnosis were considered with the presenting HPI.  Medications  0.9 %  sodium chloride infusion (Manually program via Guardrails IV Fluids) (has no administration in time range)  sodium chloride 0.9 % bolus 1,000 mL (1,000 mLs Intravenous New Bag/Given 11/22/20 2132)    Vitals:   11/22/20 1745 11/22/20 2000 11/22/20 2100 11/22/20 2200  BP: 121/62 (!) 124/54 (!) 113/57 122/60  Pulse: 90 87 86 88  Resp: (!) 36 (!) 25 19 (!) 24  Temp:      TempSrc:      SpO2: 97% 98% 97% 97%    Final diagnoses:  Anemia of chronic disease    Admission/ observation were discussed with the admitting physician, patient and/or family and they are comfortable with the plan.   Final Clinical Impression(s) / ED Diagnoses Final diagnoses:  Anemia of  chronic disease    Rx / DC Orders ED Discharge Orders    None       Deno Etienne, DO 11/22/20 2224

## 2020-11-22 NOTE — H&P (Signed)
History and Physical   Ricky [name] MVE:[id number] DOB: 09-11-1931 DOA: 11/22/2020  Referring MD/NP/PA: Dr. Tyrone Nine  PCP: Patient, No Pcp Per   Patient coming from: Independent living facility  Chief Complaint: Decreased urine output  HPI: Ricky [name] is a 87 y.o. male with medical history significant of atrial fibrillation on chronic anticoagulation recurrent anemia of chronic disease, history of colon polyps, recurrent blood transfusions, coronary artery disease, history of melanoma and BPH who presents to the ER today complaining of decreased urine output.  Patient reported drinking adequate water per his estimation.  Despite that he has noted that his urine has decreased.  Patient was seen and evaluated.  Bladder scan was done showing no significant urine in the bladder just over 100 cc.  His creatinine is however notably elevated from previous 1.02-1.41.  He was also noted to have drop again of his hemoglobin to 6.8.  Patient has had recurrent transfusion due to this anemia of chronic disease.  No overt GI bleed.  Denied any melena.  Denied any bright red blood per rectum.  Is therefore being admitted for observation and blood transfusion ordered.  Also for management of acute AKI that suspected to be prerenal..  ED Course: Temperature 97.8 blood pressure is 124/54, pulse 90 respirate of 36 oxygen sat 97% on room air.  White count 9.4 hemoglobin 6.8 platelets 138.  Sodium 134 potassium 3.7 chloride 104 CO2 20 BUN 24 creatinine 1.41.  Fecal occult blood testing is currently pending.  1 unit of packed red blood cells is ordered and patient being admitted to the hospital for further evaluation and treatment.  Review of Systems: As per HPI otherwise 10 point review of systems negative.    Past Medical History:  Diagnosis Date  . A-fib ([place])   . Anemia   . Anemia of chronic disease 02/02/2020  . Atrial fibrillation ([place])   . Boerhaave's syndrome   . Chronic anticoagulation  01/22/2020  . Colon polyps   . COVID-19   . Hx of colonic polyps 01/22/2020   2017 - [place]  . Hypomagnesemia   . Low grade myelodysplastic syndrome lesions ([place]) 02/02/2020  . Lower urinary tract symptoms due to benign prostatic hyperplasia   . Pacemaker   . Paroxysmal atrial fibrillation ([facility])   . Prostate enlargement   . Symptomatic anemia 12/26/2019  . Vitamin D deficiency     Past Surgical History:  Procedure Laterality Date  . esophogeal tear repair    . PACEMAKER INSERTION  2009, 2017  . THORACOTOMY     esophageal perforation  . TOTAL HIP ARTHROPLASTY Right   . TOTAL KNEE ARTHROPLASTY Left      reports that he quit smoking about 50 years ago. His smoking use included cigarettes. He has never used smokeless tobacco. He reports previous alcohol use. He reports that he does not use drugs.  No Known Allergies  Family History  Problem Relation Age of Onset  . Cirrhosis Mother   . Alcoholism Mother   . Heart attack Father 98  . Heart disease Brother   . Diabetes Brother   . Brain cancer Daughter      Prior to Admission medications   Medication Sig Start Date End Date Taking? Authorizing Provider  apixaban (ELIQUIS) 5 MG TABS tablet Take 1 tablet (5 mg total) by mouth 2 (two) times daily. 02/29/20   Nahser, Wonda Cheng, MD  cholecalciferol (VITAMIN D3) 25 MCG (1000 UNIT) tablet Take 1,000 Units by mouth daily.  [provider]  cyanocobalamin 1000 MCG tablet Take 1,000 mcg by mouth daily.    [provider]  deferasirox (EXJADE) 500 MG disintegrating tablet Take 2 tablets (1,000 mg total) by mouth daily before breakfast. 07/26/20   Nicholas Lose, MD  furosemide (LASIX) 20 MG tablet TAKE 1 TO 2 TABLETS BY MOUTH DAILY 11/12/20   Nahser, Wonda Cheng, MD  Magnesium 250 MG TABS Take 1 tablet (250 mg total) by mouth daily. 12/27/19   Barb Merino, MD  simvastatin (ZOCOR) 20 MG tablet Take 20 mg by mouth daily.    [provider]  traMADol (ULTRAM) 50 MG tablet  Take 1 tablet (50 mg total) by mouth every 6 (six) hours as needed. 09/16/20   Harle Stanford., PA-C    Physical Exam: Vitals:   11/22/20 1745 11/22/20 2000 11/22/20 2100 11/22/20 2200  BP: 121/62 (!) 124/54 (!) 113/57 122/60  Pulse: 90 87 86 88  Resp: (!) 36 (!) 25 19 (!) 24  Temp:      TempSrc:      SpO2: 97% 98% 97% 97%      Constitutional: Chronically ill looking with no distress Vitals:   11/22/20 1745 11/22/20 2000 11/22/20 2100 11/22/20 2200  BP: 121/62 (!) 124/54 (!) 113/57 122/60  Pulse: 90 87 86 88  Resp: (!) 36 (!) 25 19 (!) 24  Temp:      TempSrc:      SpO2: 97% 98% 97% 97%   Eyes: PERRL, lids and conjunctivae normal ENMT: Mucous membranes are dry. Posterior pharynx clear of any exudate or lesions.Normal dentition.  Neck: normal, supple, no masses, no thyromegaly Respiratory: clear to auscultation bilaterally, no wheezing, no crackles. Normal respiratory effort. No accessory muscle use.  Cardiovascular: Irregularly irregular rate and rhythm, no murmurs / rubs / gallops. No extremity edema. 2+ pedal pulses. No carotid bruits.  Abdomen: no tenderness, no masses palpated. No hepatosplenomegaly. Bowel sounds positive.  Musculoskeletal: no clubbing / cyanosis. No joint deformity upper and lower extremities. Good ROM, no contractures. Normal muscle tone.  Skin: no rashes, lesions, ulcers. No induration Neurologic: CN 2-12 grossly intact. Sensation intact, DTR normal. Strength 5/5 in all 4.  Psychiatric: Normal judgment and insight. Alert and oriented x 3. Normal mood.     Labs on Admission: I have personally reviewed following labs and imaging studies  CBC: Recent Labs  Lab 11/22/20 2126  WBC 9.4  NEUTROABS 6.4  HGB 6.8*  HCT 21.9*  MCV 93.2  PLT 144*   Basic Metabolic Panel: Recent Labs  Lab 11/22/20 2126  NA 134*  K 3.7  CL 104  CO2 21*  GLUCOSE 110*  BUN 24*  CREATININE 1.41*  CALCIUM 8.9   GFR: Estimated Creatinine Clearance: 39.3 mL/min (A)  (by C-G formula based on SCr of 1.41 mg/dL (H)). Liver Function Tests: No results for input(s): AST, ALT, ALKPHOS, BILITOT, PROT, ALBUMIN in the last 168 hours. No results for input(s): LIPASE, AMYLASE in the last 168 hours. No results for input(s): AMMONIA in the last 168 hours. Coagulation Profile: No results for input(s): INR, PROTIME in the last 168 hours. Cardiac Enzymes: No results for input(s): CKTOTAL, CKMB, CKMBINDEX, TROPONINI in the last 168 hours. BNP (last 3 results) No results for input(s): PROBNP in the last 8760 hours. HbA1C: No results for input(s): HGBA1C in the last 72 hours. CBG: No results for input(s): GLUCAP in the last 168 hours. Lipid Profile: No results for input(s): CHOL, HDL, LDLCALC, TRIG, CHOLHDL, LDLDIRECT in  the last 72 hours. Thyroid Function Tests: No results for input(s): TSH, T4TOTAL, FREET4, T3FREE, THYROIDAB in the last 72 hours. Anemia Panel: No results for input(s): VITAMINB12, FOLATE, FERRITIN, TIBC, IRON, RETICCTPCT in the last 72 hours. Urine analysis: No results found for: COLORURINE, APPEARANCEUR, LABSPEC, PHURINE, GLUCOSEU, HGBUR, BILIRUBINUR, KETONESUR, PROTEINUR, UROBILINOGEN, NITRITE, LEUKOCYTESUR Sepsis Labs: @LABRCNTIP (procalcitonin:4,lacticidven:4) )No results found for this or any previous visit (from the past 240 hour(s)).   Radiological Exams on Admission: No results found.   Assessment/Plan Principal Problem:   AKI (acute kidney injury) ([place]) Active Problems:   Atrial fibrillation (H[facility])   Anemia of chronic disease   BPH with urinary obstruction   Coronary artery disease involving native coronary artery   Thrombocytopenia (H[facility])     #1 AKI: Most likely prerenal.  This may be why patient has decreased urine output.  We will hydrate the patient.  Monitor urine output.  Monitor creatinine and BUN.  #2 symptomatic anemia: Appears to be chronic.  He has had a chronic transfusion.  Patient will be transfused 1 unit of packed  red blood cells.  Follow H&H closely.  #3 atrial fibrillation: Rate is controlled.  We will continue with Eliquis.  No evidence of acute bleed  #4 thrombocytopenia: Also chronic.  Probably bone marrow suppression.  Continue to monitor and follow-up stool guaiacs  #5 BPH: Stable.  No evidence of obstruction at this point.  Continue to monitor  #6 coronary artery disease: Stable. Monitor on telemetry   DVT prophylaxis: Eliquis Code Status: Full code Family Communication: Son at bedside Disposition Plan: Back to facility Consults called: None Admission status: Observation  Severity of Illness: The appropriate patient status for this patient is OBSERVATION. Observation status is judged to be reasonable and necessary in order to provide the required intensity of service to ensure the patient's safety. The patient's presenting symptoms, physical exam findings, and initial radiographic and laboratory data in the context of their medical condition is felt to place them at decreased risk for further clinical deterioration. Furthermore, it is anticipated that the patient will be medically stable for discharge from the hospital within 2 midnights of admission. The following factors support the patient status of observation.   " The patient's presenting symptoms include decreased urine output. " The physical exam findings include chronically ill looking and dry. " The initial radiographic and laboratory data are hemoglobin 6.8.     Barbette Merino MD [facility] Pager [phone]  If 7PM-7AM, please contact night-coverage [email or web address] Password [facility]  11/22/2020, 10:49 PM

## 2020-11-22 NOTE — ED Notes (Signed)
Bladder scan reveals 72ml

## 2020-11-22 NOTE — ED Triage Notes (Signed)
Pt here with reports of being unable to void since yesterday. Pt denies pain.

## 2020-11-22 NOTE — ED Notes (Signed)
Reported chief complaint to Faustino Congress, NP. She advised pt should go to ED.

## 2020-11-22 NOTE — ED Notes (Signed)
No answer for VS x3

## 2020-11-22 NOTE — ED Notes (Signed)
Report attempted, number left with nurse.

## 2020-11-23 ENCOUNTER — Encounter (HOSPITAL_COMMUNITY): Payer: Self-pay | Admitting: Internal Medicine

## 2020-11-23 DIAGNOSIS — N179 Acute kidney failure, unspecified: Secondary | ICD-10-CM | POA: Diagnosis not present

## 2020-11-23 LAB — COMPREHENSIVE METABOLIC PANEL
ALT: 27 U/L (ref 0–44)
AST: 20 U/L (ref 15–41)
Albumin: 3.3 g/dL — ABNORMAL LOW (ref 3.5–5.0)
Alkaline Phosphatase: 60 U/L (ref 38–126)
Anion gap: 7 (ref 5–15)
BUN: 23 mg/dL (ref 8–23)
CO2: 21 mmol/L — ABNORMAL LOW (ref 22–32)
Calcium: 8.5 mg/dL — ABNORMAL LOW (ref 8.9–10.3)
Chloride: 107 mmol/L (ref 98–111)
Creatinine, Ser: 1.34 mg/dL — ABNORMAL HIGH (ref 0.61–1.24)
GFR, Estimated: 51 mL/min — ABNORMAL LOW (ref >60)
Glucose, Bld: 111 mg/dL — ABNORMAL HIGH (ref 70–99)
Potassium: 3.7 mmol/L (ref 3.5–5.1)
Sodium: 135 mmol/L (ref 135–145)
Total Bilirubin: 1.7 mg/dL — ABNORMAL HIGH (ref 0.3–1.2)
Total Protein: 7.2 g/dL (ref 6.5–8.1)

## 2020-11-23 LAB — CBC
HCT: 23.7 % — ABNORMAL LOW (ref 39.0–52.0)
Hemoglobin: 7.9 g/dL — ABNORMAL LOW (ref 13.0–17.0)
MCH: 29.9 pg (ref 26.0–34.0)
MCHC: 33.3 g/dL (ref 30.0–36.0)
MCV: 89.8 fL (ref 80.0–100.0)
Platelets: 175 10*3/uL (ref 150–400)
RBC: 2.64 MIL/uL — ABNORMAL LOW (ref 4.22–5.81)
RDW: 20.8 % — ABNORMAL HIGH (ref 11.5–15.5)
WBC: 11.6 10*3/uL — ABNORMAL HIGH (ref 4.0–10.5)
nRBC: 0 % (ref 0.0–0.2)

## 2020-11-23 LAB — URINALYSIS, ROUTINE W REFLEX MICROSCOPIC
Bacteria, UA: NONE SEEN
Bilirubin Urine: NEGATIVE
Glucose, UA: NEGATIVE mg/dL
Ketones, ur: NEGATIVE mg/dL
Leukocytes,Ua: NEGATIVE
Nitrite: NEGATIVE
Protein, ur: 100 mg/dL — AB
Specific Gravity, Urine: 1.025 (ref 1.005–1.030)
pH: 5 (ref 5.0–8.0)

## 2020-11-23 NOTE — Discharge Summary (Signed)
Discharge Summary  Ricky [name] MWN:[id number] DOB: Nov 23, 1930  PCP: Patient, No Pcp Per  Admit date: 11/22/2020 Discharge date: 11/23/2020  Time spent: 35 minutes.  Recommendations for Outpatient Follow-up:  1. Follow-up with your PCP in 1 to 2 weeks. 2. Follow-up with your cardiologist in 1 to 2 weeks. 3. Follow-up with your hematologist in 1 to 2 weeks. 4. Continue fall precautions. 5. Take your medications as prescribed.  Discharge Diagnoses:  Active Hospital Problems   Diagnosis Date Noted  . AKI (acute kidney injury) ([place]) 11/22/2020  . Thrombocytopenia ([place]) 11/22/2020  . BPH with urinary obstruction 07/05/2020  . Anemia of chronic disease 02/02/2020  . Atrial fibrillation ([place]) 01/22/2020  . Coronary artery disease involving native coronary artery 08/24/2015    Resolved Hospital Problems  No resolved problems to display.    Discharge Condition: Stable.  Diet recommendation: Resume previous diet.  Vitals:   11/23/20 0733 11/23/20 1211  BP: (!) 149/88 135/68  Pulse: (!) 109 89  Resp: 18 18  Temp: 98 F (36.7 C) 98.3 F (36.8 C)  SpO2: 99% 99%    History of present illness:   Ricky [name] is a 87 y.o. male with medical history significant of atrial fibrillation on chronic anticoagulation recurrent anemia of chronic disease, history of colon polyps, recurrent blood transfusions, coronary artery disease, history of melanoma and BPH who presents to the ER today complaining of decreased urine output.  Patient reported drinking adequate water per his estimation.  Despite that he has noted that his urine has decreased.  Patient was seen and evaluated.  Bladder scan was done showing no significant urine in the bladder just over 100 cc.  His creatinine is however notably elevated from previous 1.02-1.41.  He was also noted to have drop again of his hemoglobin to 6.8.  Patient has had recurrent transfusion due to this anemia of chronic disease.  No overt GI bleed.   Denied any melena.  Denied any bright red blood per rectum.  Is therefore being admitted for observation and blood transfusion ordered.  Also for management of acute AKI that suspected to be prerenal..  ED Course: Temperature 97.8 blood pressure is 124/54, pulse 90 respirate of 36 oxygen sat 97% on room air.  White count 9.4 hemoglobin 6.8 platelets 138.  Sodium 134 potassium 3.7 chloride 104 CO2 20 BUN 24 creatinine 1.41.  Fecal occult blood testing is currently pending.  1 unit of packed red blood cells is ordered and patient being admitted to the hospital for further evaluation and treatment.  11/23/20: Patient was seen and examined at bedside this morning.  Has no new complaints.  He has been able to void without difficulty.  Vital signs and labs reviewed and are stable.  Hospital Course:  Principal Problem:   AKI (acute kidney injury) ([place]) Active Problems:   Atrial fibrillation ([place])   Anemia of chronic disease   BPH with urinary obstruction   Coronary artery disease involving native coronary artery   Thrombocytopenia (H[facility])  #1  Resolving AKI: Most likely prerenal secondary to dehydration Creatinine downtrending 1.34 from 1.41. Avoid dehydration and nephrotoxic agents. Follow-up with your PCP in 1 to 2 weeks.  #2 symptomatic anemia: Appears to be chronic.  He has had a chronic transfusion.    He received 1 unit PRBC.  Hemoglobin up trended to 7.9 from 6.8.  Follow-up with your hematologist in 1 to 2 weeks.  #3 paroxysmal atrial fibrillation: Rate is controlled.  We will continue with  Eliquis.  No evidence of acute bleed. Follow-up with your cardiologist in 1 to 2 weeks.  #4  Resolved thrombocytopenia: Probably bone marrow suppression.  Follow-up with your hematologist.  #5 BPH: Stable.  No evidence of obstruction at this point. He has been voiding without difficulty in the hospital.  #6 coronary artery disease: Stable.  Follow-up with your  cardiologist.     Procedures:  None.  Consultations:  None.  Discharge Exam: BP 135/68 (BP Location: Right Arm)   Pulse 89   Temp 98.3 F (36.8 C) (Oral)   Resp 18   Ht 5\' 11"  (1.803 m)   Wt 87.8 kg   SpO2 99%   BMI 27.00 kg/m  . General: 87 y.o. year-old male well developed well nourished in no acute distress.  Alert and interactive.  Hard of hearing. . Cardiovascular: Regular rate and rhythm with no rubs or gallops.  No thyromegaly or JVD noted.   Marland Kitchen Respiratory: Clear to auscultation with no wheezes or rales. Good inspiratory effort. . Abdomen: Soft nontender nondistended with normal bowel sounds x4 quadrants. . Musculoskeletal: No lower extremity edema bilaterally. Marland Kitchen Psychiatry: Mood is appropriate for condition and setting  Discharge Instructions You were cared for by a hospitalist during your hospital stay. If you have any questions about your discharge medications or the care you received while you were in the hospital after you are discharged, you can call the unit and asked to speak with the hospitalist on call if the hospitalist that took care of you is not available. Once you are discharged, your primary care physician will handle any further medical issues. Please note that NO REFILLS for any discharge medications will be authorized once you are discharged, as it is imperative that you return to your primary care physician (or establish a relationship with a primary care physician if you do not have one) for your aftercare needs so that they can reassess your need for medications and monitor your lab values.   Allergies as of 11/23/2020   No Known Allergies     Medication List    STOP taking these medications   traMADol 50 MG tablet Commonly known as: ULTRAM     TAKE these medications   apixaban 5 MG Tabs tablet Commonly known as: Eliquis Take 1 tablet (5 mg total) by mouth 2 (two) times daily.   cholecalciferol 25 MCG (1000 UNIT) tablet Commonly known  as: VITAMIN D3 Take 1,000 Units by mouth daily.   cyanocobalamin 1000 MCG tablet Take 1,000 mcg by mouth daily.   deferasirox 500 MG disintegrating tablet Commonly known as: Exjade Take 2 tablets (1,000 mg total) by mouth daily before breakfast.   furosemide 20 MG tablet Commonly known as: LASIX TAKE 1 TO 2 TABLETS BY MOUTH DAILY What changed:   how much to take  how to take this  when to take this   Magnesium 250 MG Tabs Take 1 tablet (250 mg total) by mouth daily.   simvastatin 20 MG tablet Commonly known as: ZOCOR Take 20 mg by mouth daily.      No Known Allergies  Follow-up Information    Nahser, Wonda Cheng, MD. Call in 1 day(s).   Specialty: Cardiology Why: Please call for a post hospital follow-up appointment. Contact information: [place] [address] [phone]        Nicholas Lose, MD. Call in 1 day(s).   Specialty: Hematology and Oncology Why: Please call for a post hospital follow-up  appointment. Contact information: [place] [address] [phone]                The results of significant diagnostics from this hospitalization (including imaging, microbiology, ancillary and laboratory) are listed below for reference.    Significant Diagnostic Studies: No results found.  Microbiology: Recent Results (from the past 240 hour(s))  Resp Panel by RT-PCR (Flu A&B, Covid) Nasopharyngeal Swab     Status: None   Collection Time: 11/22/20 10:28 PM   Specimen: Nasopharyngeal Swab; Nasopharyngeal(NP) swabs in vial transport medium  Result Value Ref Range Status   SARS Coronavirus 2 by RT PCR NEGATIVE NEGATIVE Final    Comment: (NOTE) SARS-CoV-2 target nucleic acids are NOT DETECTED.  The SARS-CoV-2 RNA is generally detectable in upper respiratory specimens during the acute phase of infection. The lowest concentration of SARS-CoV-2 viral copies this assay can detect is 138 copies/mL.  A negative result does not preclude SARS-Cov-2 infection and should not be used as the sole basis for treatment or other patient management decisions. A negative result may occur with  improper specimen collection/handling, submission of specimen other than nasopharyngeal swab, presence of viral mutation(s) within the areas targeted by this assay, and inadequate number of viral copies(<138 copies/mL). A negative result must be combined with clinical observations, patient history, and epidemiological information. The expected result is Negative.  Fact Sheet for Patients:  EntrepreneurPulse.com.au  Fact Sheet for Healthcare Providers:  IncredibleEmployment.be  This test is no t yet approved or cleared by the [place] FDA and  has been authorized for detection and/or diagnosis of SARS-CoV-2 by FDA under an Emergency Use Authorization (EUA). This EUA will remain  in effect (meaning this test can be used) for the duration of the COVID-19 declaration under Section 564(b)(1) of the Act, 21 U.S.C.section 360bbb-3(b)(1), unless the authorization is terminated  or revoked sooner.       Influenza A by PCR NEGATIVE NEGATIVE Final   Influenza B by PCR NEGATIVE NEGATIVE Final    Comment: (NOTE) The Xpert Xpress SARS-CoV-2/FLU/RSV plus assay is intended as an aid in the diagnosis of influenza from Nasopharyngeal swab specimens and should not be used as a sole basis for treatment. Nasal washings and aspirates are unacceptable for Xpert Xpress SARS-CoV-2/FLU/RSV testing.  Fact Sheet for Patients: EntrepreneurPulse.com.au  Fact Sheet for Healthcare Providers: IncredibleEmployment.be  This test is not yet approved or cleared by the [place] FDA and has been authorized for detection and/or diagnosis of SARS-CoV-2 by FDA under an Emergency Use Authorization (EUA). This EUA will remain in effect (meaning this test  can be used) for the duration of the COVID-19 declaration under Section 564(b)(1) of the Act, 21 U.S.C. section 360bbb-3(b)(1), unless the authorization is terminated or revoked.  Performed at [facility], [place] [address], [place], [place] [address]      Labs: Basic Metabolic Panel: Recent Labs  Lab 11/22/20 2126 11/23/20 0716  NA 134* 135  K 3.7 3.7  CL 104 107  CO2 21* 21*  GLUCOSE 110* 111*  BUN 24* 23  CREATININE 1.41* 1.34*  CALCIUM 8.9 8.5*   Liver Function Tests: Recent Labs  Lab 11/23/20 0716  AST 20  ALT 27  ALKPHOS 60  BILITOT 1.7*  PROT 7.2  ALBUMIN 3.3*   No results for input(s): LIPASE, AMYLASE in the last 168 hours. No results for input(s): AMMONIA in the last 168 hours. CBC: Recent Labs  Lab 11/22/20 2126 11/23/20 0716  WBC  9.4 11.6*  NEUTROABS 6.4  --   HGB 6.8* 7.9*  HCT 21.9* 23.7*  MCV 93.2 89.8  PLT 138* 175   Cardiac Enzymes: No results for input(s): CKTOTAL, CKMB, CKMBINDEX, TROPONINI in the last 168 hours. BNP: BNP (last 3 results) No results for input(s): BNP in the last 8760 hours.  ProBNP (last 3 results) No results for input(s): PROBNP in the last 8760 hours.  CBG: No results for input(s): GLUCAP in the last 168 hours.     Signed:  Kayleen Memos, MD [facility] 11/23/2020, 2:33 PM

## 2020-11-23 NOTE — Discharge Instructions (Addendum)
° °[employer] medicine (25th ed., pp. 1059-1068). [place], [place]: Elsevier.">  °Anemia ° °Anemia is a condition in which there is not enough red blood cells or hemoglobin in the blood. Hemoglobin is a substance in red blood cells that carries oxygen. °When you do not have enough red blood cells or hemoglobin (are anemic), your body cannot get enough oxygen and your organs may not work properly. As a result, you may feel very tired or have other problems. °What are the causes? °Common causes of anemia include: °· Excessive bleeding. Anemia can be caused by excessive bleeding inside or outside the body, including bleeding from the intestines or from heavy menstrual periods in females. °· Poor nutrition. °· Long-lasting (chronic) kidney, thyroid, and liver disease. °· Bone marrow disorders, spleen problems, and blood disorders. °· Cancer and treatments for cancer. °· HIV (human immunodeficiency virus) and AIDS (acquired immunodeficiency syndrome). °· Infections, medicines, and autoimmune disorders that destroy red blood cells. °What are the signs or symptoms? °Symptoms of this condition include: °· Minor weakness. °· Dizziness. °· Headache, or difficulties concentrating and sleeping. °· Heartbeats that feel irregular or faster than normal (palpitations). °· Shortness of breath, especially with exercise. °· Pale skin, lips, and nails, or cold hands and feet. °· Indigestion and nausea. °Symptoms may occur suddenly or develop slowly. If your anemia is mild, you may not have symptoms. °How is this diagnosed? °This condition is diagnosed based on blood tests, your medical history, and a physical exam. In some cases, a test may be needed in which cells are removed from the soft tissue inside of a bone and looked at under a microscope (bone marrow biopsy). Your health care provider may also check your stool (feces) for blood and may do additional testing to look for the cause of your bleeding. °Other tests may  include: °· Imaging tests, such as a CT scan or MRI. °· A procedure to see inside your esophagus and stomach (endoscopy). °· A procedure to see inside your colon and rectum (colonoscopy). °How is this treated? °Treatment for this condition depends on the cause. If you continue to lose a lot of blood, you may need to be treated at a hospital. Treatment may include: °· Taking supplements of iron, vitamin B12, or folic acid. °· Taking a hormone medicine (erythropoietin) that can help to stimulate red blood cell growth. °· Having a blood transfusion. This may be needed if you lose a lot of blood. °· Making changes to your diet. °· Having surgery to remove your spleen. °Follow these instructions at home: °· Take over-the-counter and prescription medicines only as told by your health care provider. °· Take supplements only as told by your health care provider. °· Follow any diet instructions that you were given by your health care provider. °· Keep all follow-up visits as told by your health care provider. This is important. °Contact a health care provider if: °· You develop new bleeding anywhere in the body. °Get help right away if: °· You are very weak. °· You are short of breath. °· You have pain in your abdomen or chest. °· You are dizzy or feel faint. °· You have trouble concentrating. °· You have bloody stools, black stools, or tarry stools. °· You vomit repeatedly or you vomit up blood. °These symptoms may represent a serious problem that is an emergency. Do not wait to see if the symptoms will go away. Get medical help right away. Call your local emergency services (911 in the [place] Do   Do not drive yourself to the hospital. Summary  Anemia is a condition in which you do not have enough red blood cells or enough of a substance in your red blood cells that carries oxygen (hemoglobin).  Symptoms may occur suddenly or develop slowly.  If your anemia is mild, you may not have symptoms.  This condition is  diagnosed with blood tests, a medical history, and a physical exam. Other tests may be needed.  Treatment for this condition depends on the cause of the anemia. This information is not intended to replace advice given to you by your health care provider. Make sure you discuss any questions you have with your health care provider. Document Revised: 08/15/2019 Document Reviewed: 08/15/2019 Elsevier Patient Education  2021 [place] on my medicine - ELIQUIS (apixaban)  This medication education was reviewed with me or my healthcare representative as part of my discharge preparation.    Why was Eliquis prescribed for you? Eliquis was prescribed for you to reduce the risk of a blood clot forming that can cause a stroke if you have a medical condition called atrial fibrillation (a type of irregular heartbeat).  What do You need to know about Eliquis ? Take your Eliquis TWICE DAILY - one tablet in the morning and one tablet in the evening with or without food. If you have difficulty swallowing the tablet whole please discuss with your pharmacist how to take the medication safely.  Take Eliquis exactly as prescribed by your doctor and DO NOT stop taking Eliquis without talking to the doctor who prescribed the medication.  Stopping may increase your risk of developing a stroke.  Refill your prescription before you run out.  After discharge, you should have regular check-up appointments with your healthcare provider that is prescribing your Eliquis.  In the future your dose may need to be changed if your kidney function or weight changes by a significant amount or as you get older.  What do you do if you miss a dose? If you miss a dose, take it as soon as you remember on the same day and resume taking twice daily.  Do not take more than one dose of ELIQUIS at the same time to make up a missed dose.  Important Safety Information A possible side effect of Eliquis is bleeding.  You should call your healthcare provider right away if you experience any of the following: ? Bleeding from an injury or your nose that does not stop. ? Unusual colored urine (red or dark brown) or unusual colored stools (red or black). ? Unusual bruising for unknown reasons. ? A serious fall or if you hit your head (even if there is no bleeding).  Some medicines may interact with Eliquis and might increase your risk of bleeding or clotting while on Eliquis. To help avoid this, consult your healthcare provider or pharmacist prior to using any new prescription or non-prescription medications, including herbals, vitamins, non-steroidal anti-inflammatory drugs (NSAIDs) and supplements.  This website has more information on Eliquis (apixaban): http://www.eliquis.com/eliquis/home

## 2020-11-23 NOTE — Progress Notes (Signed)
Nurse spoke with pt's son Paydon Carll) with pt's permission, regarding plan of care.

## 2020-11-23 NOTE — Progress Notes (Signed)
   11/23/20 0949  Urine Characteristics  Time patient last voided or urinary catheter emptied 0949  Urinary Interventions Bladder scan  Bladder Scan Volume (mL) 157 mL (emptied 125 did scan then he voided another 138ml)  Hygiene Peri care

## 2020-11-24 LAB — BPAM RBC
Blood Product Expiration Date: 202203222359
Blood Product Expiration Date: 202204012359
ISSUE DATE / TIME: 202202281051
ISSUE DATE / TIME: 202203050122
Unit Type and Rh: 1700
Unit Type and Rh: 7300

## 2020-11-24 LAB — TYPE AND SCREEN
ABO/RH(D): B POS
Antibody Screen: POSITIVE
DAT, IgG: POSITIVE
Donor AG Type: NEGATIVE
Donor AG Type: NEGATIVE
Unit division: 0
Unit division: 0

## 2020-11-25 ENCOUNTER — Inpatient Hospital Stay: Payer: Medicare Other | Attending: Hematology and Oncology

## 2020-11-25 ENCOUNTER — Other Ambulatory Visit: Payer: Self-pay

## 2020-11-25 ENCOUNTER — Telehealth: Payer: Self-pay | Admitting: Hematology and Oncology

## 2020-11-25 ENCOUNTER — Telehealth: Payer: Self-pay

## 2020-11-25 DIAGNOSIS — D638 Anemia in other chronic diseases classified elsewhere: Secondary | ICD-10-CM

## 2020-11-25 DIAGNOSIS — D4620 Refractory anemia with excess of blasts, unspecified: Secondary | ICD-10-CM

## 2020-11-25 LAB — CBC WITH DIFFERENTIAL (CANCER CENTER ONLY)
Abs Immature Granulocytes: 0.05 10*3/uL (ref 0.00–0.07)
Basophils Absolute: 0 10*3/uL (ref 0.0–0.1)
Basophils Relative: 0 %
Eosinophils Absolute: 0.1 10*3/uL (ref 0.0–0.5)
Eosinophils Relative: 1 %
HCT: 20.6 % — ABNORMAL LOW (ref 39.0–52.0)
Hemoglobin: 6.7 g/dL — CL (ref 13.0–17.0)
Immature Granulocytes: 1 %
Lymphocytes Relative: 9 %
Lymphs Abs: 0.8 10*3/uL (ref 0.7–4.0)
MCH: 29 pg (ref 26.0–34.0)
MCHC: 32.5 g/dL (ref 30.0–36.0)
MCV: 89.2 fL (ref 80.0–100.0)
Monocytes Absolute: 1.7 10*3/uL — ABNORMAL HIGH (ref 0.1–1.0)
Monocytes Relative: 19 %
Neutro Abs: 6.2 10*3/uL (ref 1.7–7.7)
Neutrophils Relative %: 70 %
Platelet Count: 141 10*3/uL — ABNORMAL LOW (ref 150–400)
RBC: 2.31 MIL/uL — ABNORMAL LOW (ref 4.22–5.81)
RDW: 21.2 % — ABNORMAL HIGH (ref 11.5–15.5)
Smear Review: 2
WBC Count: 8.7 10*3/uL (ref 4.0–10.5)
nRBC: 0 % (ref 0.0–0.2)

## 2020-11-25 NOTE — Telephone Encounter (Signed)
RN spoke with patient's son. Pt is having increase in fatigue and shortness of breath.   Pt with history of anemia.   Pt's son reports he is presenting "like his hemoglobin is low."   Pt scheduled for lab appointment today, and proactively set up for transfusion on 3/8 @ 9am due to antibodies.  Pt's son aware, verbalized understanding and agreement.

## 2020-11-25 NOTE — Telephone Encounter (Signed)
Scheduled infusion appointment per 3/7 schedule message. Patient is aware.

## 2020-11-25 NOTE — Progress Notes (Signed)
CRITICAL VALUE STICKER  CRITICAL VALUE: Hgb 6.7  RECEIVER (on-site recipient of call): Katheren Puller, RN   DATE & TIME NOTIFIED: 11/25/2020 @ 1442  MESSENGER (representative from lab): Rosann Auerbach  MD NOTIFIED: Nicholas Lose   TIME OF NOTIFICATION: 4619  RESPONSE: Pt is scheduled to receive for 2 units of PRBC's.

## 2020-11-26 ENCOUNTER — Inpatient Hospital Stay: Payer: Medicare Other

## 2020-11-26 ENCOUNTER — Other Ambulatory Visit: Payer: Self-pay

## 2020-11-26 DIAGNOSIS — D4620 Refractory anemia with excess of blasts, unspecified: Secondary | ICD-10-CM | POA: Diagnosis not present

## 2020-11-26 DIAGNOSIS — D638 Anemia in other chronic diseases classified elsewhere: Secondary | ICD-10-CM

## 2020-11-26 MED ORDER — ACETAMINOPHEN 325 MG PO TABS
ORAL_TABLET | ORAL | Status: AC
  Filled 2020-11-26: qty 2

## 2020-11-26 MED ORDER — DIPHENHYDRAMINE HCL 25 MG PO CAPS
25.0000 mg | ORAL_CAPSULE | Freq: Once | ORAL | Status: AC
  Administered 2020-11-26: 25 mg via ORAL

## 2020-11-26 MED ORDER — DIPHENHYDRAMINE HCL 25 MG PO CAPS
ORAL_CAPSULE | ORAL | Status: AC
  Filled 2020-11-26: qty 1

## 2020-11-26 MED ORDER — SODIUM CHLORIDE 0.9% IV SOLUTION
250.0000 mL | Freq: Once | INTRAVENOUS | Status: AC
  Administered 2020-11-26: 250 mL via INTRAVENOUS
  Filled 2020-11-26: qty 250

## 2020-11-26 MED ORDER — ACETAMINOPHEN 325 MG PO TABS
650.0000 mg | ORAL_TABLET | Freq: Once | ORAL | Status: AC
  Administered 2020-11-26: 650 mg via ORAL

## 2020-11-26 NOTE — Patient Instructions (Signed)

## 2020-11-27 LAB — TYPE AND SCREEN
ABO/RH(D): B POS
Antibody Screen: POSITIVE
DAT, IgG: POSITIVE
Donor AG Type: NEGATIVE
Donor AG Type: NEGATIVE
Unit division: 0
Unit division: 0

## 2020-11-27 LAB — BPAM RBC
Blood Product Expiration Date: 202204152359
Blood Product Expiration Date: 202204152359
ISSUE DATE / TIME: 202203080949
ISSUE DATE / TIME: 202203080949
Unit Type and Rh: 7300
Unit Type and Rh: 7300

## 2020-11-28 ENCOUNTER — Inpatient Hospital Stay: Payer: Medicare Other

## 2020-11-28 ENCOUNTER — Encounter: Payer: Self-pay | Admitting: *Deleted

## 2020-11-28 ENCOUNTER — Other Ambulatory Visit: Payer: Self-pay

## 2020-11-28 DIAGNOSIS — D4620 Refractory anemia with excess of blasts, unspecified: Secondary | ICD-10-CM

## 2020-11-28 LAB — CBC WITH DIFFERENTIAL (CANCER CENTER ONLY)
Abs Immature Granulocytes: 0.22 10*3/uL — ABNORMAL HIGH (ref 0.00–0.07)
Basophils Absolute: 0 10*3/uL (ref 0.0–0.1)
Basophils Relative: 0 %
Eosinophils Absolute: 0.1 10*3/uL (ref 0.0–0.5)
Eosinophils Relative: 1 %
HCT: 23.2 % — ABNORMAL LOW (ref 39.0–52.0)
Hemoglobin: 7.9 g/dL — ABNORMAL LOW (ref 13.0–17.0)
Immature Granulocytes: 2 %
Lymphocytes Relative: 5 %
Lymphs Abs: 0.6 10*3/uL — ABNORMAL LOW (ref 0.7–4.0)
MCH: 29.7 pg (ref 26.0–34.0)
MCHC: 34.1 g/dL (ref 30.0–36.0)
MCV: 87.2 fL (ref 80.0–100.0)
Monocytes Absolute: 2 10*3/uL — ABNORMAL HIGH (ref 0.1–1.0)
Monocytes Relative: 18 %
Neutro Abs: 8.3 10*3/uL — ABNORMAL HIGH (ref 1.7–7.7)
Neutrophils Relative %: 74 %
Platelet Count: 163 10*3/uL (ref 150–400)
RBC: 2.66 MIL/uL — ABNORMAL LOW (ref 4.22–5.81)
RDW: 18.8 % — ABNORMAL HIGH (ref 11.5–15.5)
WBC Count: 11.3 10*3/uL — ABNORMAL HIGH (ref 4.0–10.5)
nRBC: 0 % (ref 0.0–0.2)

## 2020-11-28 LAB — SAMPLE TO BLOOD BANK

## 2020-11-28 NOTE — Progress Notes (Signed)
Pt Hgb 7.9.  Per MD pt does not need transfusion at this time.  Pt notified and verbalized understanding.  States he is asymptomatic at this time.

## 2020-11-29 ENCOUNTER — Other Ambulatory Visit: Payer: Medicare Other

## 2020-11-29 MED FILL — DEFERASIROX 500 MG TBSO: 500 | 30 days supply | Qty: 60 | Fill #3

## 2020-12-02 ENCOUNTER — Ambulatory Visit: Payer: Medicare Other | Admitting: Cardiovascular Disease

## 2020-12-05 ENCOUNTER — Other Ambulatory Visit: Payer: Self-pay | Admitting: *Deleted

## 2020-12-05 ENCOUNTER — Telehealth: Payer: Self-pay | Admitting: Cardiovascular Disease

## 2020-12-05 ENCOUNTER — Encounter: Payer: Self-pay | Admitting: Interventional Cardiology

## 2020-12-05 ENCOUNTER — Inpatient Hospital Stay: Payer: Medicare Other | Admitting: Hematology and Oncology

## 2020-12-05 ENCOUNTER — Other Ambulatory Visit: Payer: Self-pay

## 2020-12-05 ENCOUNTER — Inpatient Hospital Stay: Payer: Medicare Other

## 2020-12-05 ENCOUNTER — Ambulatory Visit: Payer: Medicare Other | Admitting: Interventional Cardiology

## 2020-12-05 VITALS — BP 118/70 | HR 87 | Ht 71.0 in | Wt 205.0 lb

## 2020-12-05 DIAGNOSIS — I5033 Acute on chronic diastolic (congestive) heart failure: Secondary | ICD-10-CM

## 2020-12-05 DIAGNOSIS — I480 Paroxysmal atrial fibrillation: Secondary | ICD-10-CM | POA: Diagnosis not present

## 2020-12-05 DIAGNOSIS — M7989 Other specified soft tissue disorders: Secondary | ICD-10-CM | POA: Diagnosis not present

## 2020-12-05 DIAGNOSIS — D4620 Refractory anemia with excess of blasts, unspecified: Secondary | ICD-10-CM

## 2020-12-05 DIAGNOSIS — D649 Anemia, unspecified: Secondary | ICD-10-CM

## 2020-12-05 DIAGNOSIS — D638 Anemia in other chronic diseases classified elsewhere: Secondary | ICD-10-CM

## 2020-12-05 LAB — CBC WITH DIFFERENTIAL (CANCER CENTER ONLY)
Abs Immature Granulocytes: 0.69 10*3/uL — ABNORMAL HIGH (ref 0.00–0.07)
Basophils Absolute: 0 10*3/uL (ref 0.0–0.1)
Basophils Relative: 0 %
Eosinophils Absolute: 0.1 10*3/uL (ref 0.0–0.5)
Eosinophils Relative: 1 %
HCT: 22.1 % — ABNORMAL LOW (ref 39.0–52.0)
Hemoglobin: 7.3 g/dL — ABNORMAL LOW (ref 13.0–17.0)
Immature Granulocytes: 5 %
Lymphocytes Relative: 5 %
Lymphs Abs: 0.7 10*3/uL (ref 0.7–4.0)
MCH: 29.2 pg (ref 26.0–34.0)
MCHC: 33 g/dL (ref 30.0–36.0)
MCV: 88.4 fL (ref 80.0–100.0)
Monocytes Absolute: 2.5 10*3/uL — ABNORMAL HIGH (ref 0.1–1.0)
Monocytes Relative: 17 %
Neutro Abs: 10.6 10*3/uL — ABNORMAL HIGH (ref 1.7–7.7)
Neutrophils Relative %: 72 %
Platelet Count: 286 10*3/uL (ref 150–400)
RBC: 2.5 MIL/uL — ABNORMAL LOW (ref 4.22–5.81)
RDW: 18.9 % — ABNORMAL HIGH (ref 11.5–15.5)
WBC Count: 14.6 10*3/uL — ABNORMAL HIGH (ref 4.0–10.5)
nRBC: 0 % (ref 0.0–0.2)

## 2020-12-05 LAB — SAMPLE TO BLOOD BANK

## 2020-12-05 LAB — PREPARE RBC (CROSSMATCH)

## 2020-12-05 MED ORDER — FUROSEMIDE 20 MG PO TABS: 20.0000 mg | ORAL_TABLET | Freq: Every day | ORAL | 3 refills | Status: DC

## 2020-12-05 NOTE — Telephone Encounter (Signed)
I'm ok with him seeing Dr. Tamala Julian for evaluation and management .

## 2020-12-05 NOTE — Telephone Encounter (Signed)
    Pt c/o swelling: STAT is pt has developed SOB within 24 hours  1) How much weight have you gained and in what time span?   2) If swelling, where is the swelling located? Both legs  3) Are you currently taking a fluid pill? Yes  4) Are you currently SOB? Yes  5) Do you have a log of your daily weights (if so, list)? none  6) Have you gained 3 pounds in a day or 5 pounds in a week? Not sure  7) Have you traveled recently? No   Pt's son calling, he said pt saw pcp today and worried about his fluid retention, was advised by pcp to call heart doctor to schedule an appt. There's available appt tomorrow but it's conflict with pt's blood transfusion tomorrow. He is asking if pt can be seen today

## 2020-12-05 NOTE — Progress Notes (Signed)
Patient Care Team: Patient, No Pcp Per as PCP - General (General Practice) Nahser, Wonda Cheng, MD as PCP - Cardiology (Cardiology)  DIAGNOSIS:  Encounter Diagnosis  Name Primary?  . Low grade myelodysplastic syndrome lesions (HCC)     CHIEF COMPLIANT: Not feeling well, short of breath, generalized weakness  INTERVAL HISTORY: Ricky [name] is a 87 year old with above-mentioned history of and myelodysplastic syndrome who is requiring blood transfusions on a weekly basis.  He was in the emergency room last week on 11/23/2020 where he received 1 unit of PRBC and on 11/26/2020 he received another unit of PRBC.  For the past week he has gotten more short of breath as well as developed some leg swelling.  Apparently there was a short period of time and Lasix was discontinued.  Since that time he has built up some fluid and he was not able to go to the bathroom.  Lately though he has been able to go to the bathroom fairly well.  He still feels weak and short of breath.   ALLERGIES:  has No Known Allergies.  MEDICATIONS:  Current Outpatient Medications  Medication Sig Dispense Refill  . apixaban (ELIQUIS) 5 MG TABS tablet Take 1 tablet (5 mg total) by mouth 2 (two) times daily. 180 tablet 1  . cholecalciferol (VITAMIN D3) 25 MCG (1000 UNIT) tablet Take 1,000 Units by mouth daily.    . cyanocobalamin 1000 MCG tablet Take 1,000 mcg by mouth daily.    Marland Kitchen deferasirox (EXJADE) 500 MG disintegrating tablet Take 2 tablets (1,000 mg total) by mouth daily before breakfast. 60 tablet 3  . furosemide (LASIX) 20 MG tablet TAKE 1 TO 2 TABLETS BY MOUTH DAILY (Patient taking differently: Take 20 mg by mouth See admin instructions. TAKE 1 TO 2 TABLETS BY MOUTH DAILY) 180 tablet 2  . Magnesium 250 MG TABS Take 1 tablet (250 mg total) by mouth daily.  0  . simvastatin (ZOCOR) 20 MG tablet Take 20 mg by mouth daily.     No current facility-administered medications for this visit.    PHYSICAL EXAMINATION: ECOG  PERFORMANCE STATUS: 3 - Symptomatic, >50% confined to bed  Vitals:   12/05/20 0855  BP: (!) 125/51  Pulse: 86  Resp: 17  Temp: 98.1 F (36.7 C)  SpO2: 95%   Filed Weights   12/05/20 0855  Weight: 205 lb 6.4 oz (93.2 kg)       LABORATORY DATA:  I have reviewed the data as listed CMP Latest Ref Rng & Units 11/23/2020 11/22/2020 09/16/2020  Glucose 70 - 99 mg/dL 111(H) 110(H) 120(H)  BUN 8 - 23 mg/dL 23 24(H) 20  Creatinine 0.61 - 1.24 mg/dL 1.34(H) 1.41(H) 1.07  Sodium 135 - 145 mmol/L 135 134(L) 134(L)  Potassium 3.5 - 5.1 mmol/L 3.7 3.7 4.2  Chloride 98 - 111 mmol/L 107 104 104  CO2 22 - 32 mmol/L 21(L) 21(L) 26  Calcium 8.9 - 10.3 mg/dL 8.5(L) 8.9 8.6(L)  Total Protein 6.5 - 8.1 g/dL 7.2 - 7.4  Total Bilirubin 0.3 - 1.2 mg/dL 1.7(H) - 1.1  Alkaline Phos 38 - 126 U/L 60 - 54  AST 15 - 41 U/L 20 - 14(L)  ALT 0 - 44 U/L 27 - 23    Lab Results  Component Value Date   WBC 14.6 (H) 12/05/2020   HGB 7.3 (L) 12/05/2020   HCT 22.1 (L) 12/05/2020   MCV 88.4 12/05/2020   PLT 286 12/05/2020   NEUTROABS 10.6 (H) 12/05/2020  ASSESSMENT & PLAN:  Low grade myelodysplastic syndrome lesions [facility]) Gastroenterology evaluation: Did not feel like he is a candidate for endoscopies or biopsies. Blood transfusion 02/21/2020, 03/23/20,05/03/2020  Treatment plan:Aranesp every 3 weeks 500 mcg (increased from 300 mcg)  Lab review:  01/29/2020: Hemoglobin 7.8, MCV 103.4, platelets 149 03/28/2020: Hemoglobin is 9 (it was 6.9 on 03/22/2020) 05/23/2020: Hemoglobin 7.1(received blood transfusion) 07/05/2020: Hemoglobin 6.2 (received blood transfusion) 07/26/2020: Hemoglobin 7.3 (received blood transfusion) 09/16/2020: Hemoglobin 7.1 (received blood)  10/15/2020: Hemoglobin 6.9 (1 unit) 11/23/20: ED (1 unit PRBC) 11/26/20: I unit PRBC  Bone marrow biopsy: Hypercellular bone marrow for age 5 to 60% cellularity with dyspoietic changes associated with abundant ring sideroblasts. No increase in  blastic cells identified. 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 1000 mg a day   Severe fatigue and shortness of breath and lower extremity swelling: I believe that the patient may have some early signs of mild acute CHF.  I asked him to call his cardiologist.  We will administer 20 mg of IV Lasix along with 1 unit of PRBC.  I recommended that he increase his Lasix to 20 mg twice a day.  If his symptoms do not improve I recommended that he go to the hospital .  Return to clinic next week for labs as previously scheduled.  It appears that he is needing PRBC more often.    No orders of the defined types were placed in this encounter.  The patient has a good understanding of the overall plan. he agrees with it. he will call with any problems that may develop before the next visit here. Total time spent: 30 mins including face to face time and time spent for planning, charting and co-ordination of care   Harriette Ohara, MD 12/05/20

## 2020-12-05 NOTE — Progress Notes (Signed)
Cardiology Office Note:    Date:  12/05/2020   ID:  Ricky [name], DOB 10-18-1930, MRN [id number]  PCP:  Patient, No Pcp Per  Cardiologist:  Mertie Moores, MD   Referring MD: No ref. provider found   Chief Complaint  Patient presents with  . Congestive Heart Failure    History of Present Illness:    Ricky [name] is a 87 y.o. male with a hx of atrial fibrillation, anemia, chronic anticoagulation, and history of Boerhaave's syndrome.  Here today because of increasing shortness of breath in the setting of progressive anemia.  Ricky [name] is here, brought in by his son.  He has been experiencing shortness of breath and swelling since a recent hospitalization for severe anemia requiring transfusion.  Shortly after discharge, furosemide was discontinued because of kidney injury.  Thereafter the patient has begun swelling and developing progressive shortness of breath.  Lasix was resumed 2 days ago.  According to the son, he feels weak, has been having dyspnea on exertion, having difficulty walking with his 4-prong walker.  He has had Ricky [name].  Hemoglobin today at the hematology clinic with Dr. Lindi Adie.  The most recent creatinine on 11/23/2020 is 1.34.  BUN was 23.  Potassium 3.7.  Past Medical History:  Diagnosis Date  . A-fib ([place])   . Anemia   . Anemia of chronic disease 02/02/2020  . Atrial fibrillation ([place])   . Boerhaave's syndrome   . Chronic anticoagulation 01/22/2020  . Colon polyps   . COVID-19   . Hx of colonic polyps 01/22/2020   2017 - [place]  . Hypomagnesemia   . Low grade myelodysplastic syndrome lesions ([place]) 02/02/2020  . Lower urinary tract symptoms due to benign prostatic hyperplasia   . Pacemaker   . Paroxysmal atrial fibrillation (HCC)   . Prostate enlargement   . Symptomatic anemia 12/26/2019  . Vitamin D deficiency     Past Surgical History:  Procedure Laterality Date  . esophogeal tear repair    . PACEMAKER INSERTION  2009, 2017  .  THORACOTOMY     esophageal perforation  . TOTAL HIP ARTHROPLASTY Right   . TOTAL KNEE ARTHROPLASTY Left     Current Medications: Current Meds  Medication Sig  . apixaban (ELIQUIS) 5 MG TABS tablet Take 1 tablet (5 mg total) by mouth 2 (two) times daily.  . cholecalciferol (VITAMIN D3) 25 MCG (1000 UNIT) tablet Take 1,000 Units by mouth daily.  . cyanocobalamin 1000 MCG tablet Take 1,000 mcg by mouth daily.  Marland Kitchen deferasirox (EXJADE) 500 MG disintegrating tablet Take 2 tablets (1,000 mg total) by mouth daily before breakfast.  . furosemide (LASIX) 20 MG tablet Take 1 tablet (20 mg total) by mouth daily.  . Magnesium 250 MG TABS Take 1 tablet (250 mg total) by mouth daily.  . simvastatin (ZOCOR) 20 MG tablet Take 20 mg by mouth daily.  . [DISCONTINUED] furosemide (LASIX) 20 MG tablet TAKE 1 TO 2 TABLETS BY MOUTH DAILY (Patient taking differently: Take 20 mg by mouth See admin instructions. TAKE 1 TO 2 TABLETS BY MOUTH DAILY)     Allergies:   Patient has no known allergies.   Social History   Socioeconomic History  . Marital status: Married    Spouse name: Not on file  . Number of children: 2  . Years of education: Not on file  . Highest education level: Not on file  Occupational History  . Occupation: retired  Tobacco Use  . Smoking status:  Former Smoker    Types: Cigarettes    Quit date: 01/21/1970    Years since quitting: 50.9  . Smokeless tobacco: Never Used  Vaping Use  . Vaping Use: Never used  Substance and Sexual Activity  . Alcohol use: Not Currently  . Drug use: Never  . Sexual activity: Yes  Other Topics Concern  . Not on file  Social History Narrative  . Not on file   Social Determinants of Health   Financial Resource Strain: Not on file  Food Insecurity: Not on file  Transportation Needs: Not on file  Physical Activity: Not on file  Stress: Not on file  Social Connections: Not on file     Family History: The patient's family history includes Alcoholism  in his mother; Brain cancer in his daughter; Cirrhosis in his mother; Diabetes in his brother; Heart attack (age of onset: 8) in his father; Heart disease in his brother.  ROS:   Please see the history of present illness.    He was admitted on 11/22/2020.  His hemoglobin was 6.8.  He was having difficulty urinating.  He was told he was dehydrated.  IV fluid and a blood transfusion was given.  Shortly thereafter, following discharge he began experiencing shortness of breath which was notable for a problem.  He has normal LV systolic function.  At his age he may have diastolic dysfunction.  All other systems reviewed and are negative.  EKGs/Labs/Other Studies Reviewed:    The following studies were reviewed today:  2D Doppler echocardiogram May 29, 2020: IMPRESSIONS    1. Left ventricular ejection fraction, by estimation, is 60 to 65%. Left  ventricular ejection fraction by 3D volume is 66 %. The left ventricle has  normal function. The left ventricle has no regional wall motion  abnormalities. Left ventricular diastolic  parameters were normal.  2. Right ventricular systolic function is normal. The right ventricular  size is mildly enlarged. There is normal pulmonary artery systolic  pressure.  3. Left atrial size was mildly dilated.  4. The mitral valve is normal in structure. No evidence of mitral valve  regurgitation. No evidence of mitral stenosis.  5. The aortic valve is tricuspid. Aortic valve regurgitation is trivial.  Mild to moderate aortic valve sclerosis/calcification is present, without  any evidence of aortic stenosis.  6. Aortic dilatation noted. There is mild dilatation of the ascending  aorta, measuring 40 mm.  7. The inferior vena cava is normal in size with greater than 50%  respiratory variability, suggesting right atrial pressure of 3 mmHg.   EKG:  EKG normal sinus rhythm, left anterior hemiblock, and when compared to prior to 02/28/2020, left posterior  hemiblock has been replaced by left anterior hemiblock.  Recent Labs: 01/29/2020: TSH 4.308 11/23/2020: ALT 27; BUN 23; Creatinine, Ser 1.34; Potassium 3.7; Sodium 135 12/05/2020: Hemoglobin 7.3; Platelet Count 286  Recent Lipid Panel    Component Value Date/Time   CHOL 139 02/28/2020 1418   TRIG 60 02/28/2020 1418   HDL 53 02/28/2020 1418   CHOLHDL 2.6 02/28/2020 1418   LDLCALC 73 02/28/2020 1418    Physical Exam:    VS:  BP 118/70   Pulse 87   Ht 5\' 11"  (1.803 m)   Wt 205 lb (93 kg)   SpO2 93%   BMI 28.59 kg/m     Wt Readings from Last 3 Encounters:  12/05/20 205 lb (93 kg)  12/05/20 205 lb 6.4 oz (93.2 kg)  11/23/20 193 lb 9  oz (87.8 kg)     GEN: Elderly and pale. No acute distress HEENT: Normal NECK: Elevated neck veins lying at 45 degrees.  LYMPHATICS: No lymphadenopathy CARDIAC: No murmur. RRR no gallop, with 2+ bilateral lower extremity edema. VASCULAR:  Normal Pulses. No bruits. RESPIRATORY:  Clear to auscultation without rales, wheezing or rhonchi  ABDOMEN: Soft, non-tender, non-distended, No pulsatile mass, MUSCULOSKELETAL: No deformity  SKIN: Warm and dry NEUROLOGIC:  Alert and oriented x 3 PSYCHIATRIC:  Normal affect   ASSESSMENT:    1. Acute on chronic diastolic heart failure ([place])   2. Symptomatic anemia   3. Paroxysmal atrial fibrillation (HCC)   4. Swelling of lower extremity    PLAN:    In order of problems listed above:  1. Acute on chronic diastolic heart failure, multifactorial related to severe anemia causing fluid retention and increase work of the heart, recent discontinuation of diuretic therapy which was maintaining volume status, and possible other factors.  He was never short of breath until Lasix was withheld over the past several weeks due to an elevation in his creatinine.  Since that time swelling developed and dyspnea has occurred.  I recommended that he take an additional 20 mg of Lasix today.  In a.m. he should take 40 mg prior to  heading out for transfusion at noon time.  The following day he should continue with furosemide 20 mg daily as he had prior to the admission in early March.  He will follow-up with Dr. Acie Fredrickson and have a basic metabolic panel performed on 12/09/2020 2. Hemoglobin is chronic but in this situation is stressing his heart and kidneys and contributing to heart failure.   Medication Adjustments/Labs and Tests Ordered: Current medicines are reviewed at length with the patient today.  Concerns regarding medicines are outlined above.  No orders of the defined types were placed in this encounter.  Meds ordered this encounter  Medications  . furosemide (LASIX) 20 MG tablet    Sig: Take 1 tablet (20 mg total) by mouth daily.    Dispense:  90 tablet    Refill:  3    Change in instructions    Patient Instructions  Medication Instructions:  1) Take an additional Furosemide 20mg  when you get home.  Take Furosemide 40mg  all at once tomorrow morning before you go back for transfusion.  On Saturday, start Furosemide 20mg  once daily.  *If you need a refill on your cardiac medications before your next appointment, please call your pharmacy*   Lab Work: BMET when you come in to see Dr. Acie Fredrickson next week.  If you have labs (blood work) drawn today and your tests are completely normal, you will receive your results only by: Marland Kitchen MyChart Message (if you have MyChart) OR . A paper copy in the mail If you have any lab test that is abnormal or we need to change your treatment, we will call you to review the results.   Testing/Procedures: None   Follow-Up: At [facility], you and your health needs are our priority.  As part of our continuing mission to provide you with exceptional heart care, we have created designated Provider Care Teams.  These Care Teams include your primary Cardiologist (physician) and Advanced Practice Providers (APPs -  Physician Assistants and Nurse Practitioners) who all work together  to provide you with the care you need, when you need it.  We recommend signing up for the patient portal called "MyChart".  Sign up information is provided on this  After Visit Summary.  MyChart is used to connect with patients for Virtual Visits (Telemedicine).  Patients are able to view lab/test results, encounter notes, upcoming appointments, etc.  Non-urgent messages can be sent to your provider as well.   To learn more about what you can do with MyChart, go to [email or web address].    Your next appointment:   1 week(s)  The format for your next appointment:   In Person  Provider:   You may see Mertie Moores, MD or one of the following Advanced Practice Providers on your designated Care Team:    Kathyrn Drown, NP    Other Instructions      Signed, Sinclair Grooms, MD  12/05/2020 2:40 PM    [place]

## 2020-12-05 NOTE — Progress Notes (Signed)
CRITICAL VALUE STICKER  CRITICAL VALUE: Hgb 7.3  Geraldo Docker, RN  DATE & TIME NOTIFIED: 12/05/20 at 0900  MD NOTIFIED: Nicholas Lose, MD   RESPONSE: MD notified, orders received for pt to receive 1 unit of PRBC's tomorrow at [facility].  Orders placed, BB notified of 1 unit tomorrow at [facility].  RN successfully sent scheduling message to [facility] and faxed blood orders to [facility] as well.

## 2020-12-05 NOTE — Telephone Encounter (Signed)
Spoke with pt's son "Ed", DPR who states pt has seem Dr Verdie Drown this am and Dr Verdie Drown is recommending pt be seen by cardiology ASAP d/t pt's SOB, fatigue and LEE.  Dr Harrell Lark note from today 12/05/2020 states he suspects early CHF.  He is scheduled for transfusion of PRBC's tomorrow 12/06/2020 d/t anemia with hgb of 7.3.   Appointment scheduled for today at 2pm with Dr Tamala Julian, DOD as Dr Acie Fredrickson has no availability today.  Will also forward to Dr Acie Fredrickson for review.   Pt's son verbalizes understanding and agrees with plan.

## 2020-12-05 NOTE — Assessment & Plan Note (Addendum)
Gastroenterology evaluation: Did not feel like he is a candidate for endoscopies or biopsies. Blood transfusion 02/21/2020, 03/23/20,05/03/2020  Treatment plan:Aranesp every 3 weeks 500 mcg (increased from 300 mcg)  Lab review:  01/29/2020: Hemoglobin 7.8, MCV 103.4, platelets 149 03/28/2020: Hemoglobin is 9 (it was 6.9 on 03/22/2020) 05/23/2020: Hemoglobin 7.1(received blood transfusion) 07/05/2020: Hemoglobin 6.2 (received blood transfusion) 07/26/2020: Hemoglobin 7.3 (received blood transfusion) 09/16/2020: Hemoglobin 7.1 (received blood)  10/15/2020: Hemoglobin 6.9 (1 unit) 11/23/20: ED (1 unit PRBC) 11/26/20: I unit PRBC  Bone marrow biopsy: Hypercellular bone marrow for age 69 to 60% cellularity with dyspoietic changes associated with abundant ring sideroblasts. No increase in blastic cells identified. 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 1000 mg a day   Severe fatigue and shortness of breath and lower extremity swelling: I believe that the patient may have some early signs of acute CHF.  I asked him to call his cardiologist.  We will administer 20 mg of IV Lasix along with 1 unit of PRBC.  Return to clinic next week for labs as previously scheduled.  It appears that he is needing PRBC more often.

## 2020-12-05 NOTE — Patient Instructions (Signed)
Medication Instructions:  1) Take an additional Furosemide 20mg  when you get home.  Take Furosemide 40mg  all at once tomorrow morning before you go back for transfusion.  On Saturday, start Furosemide 20mg  once daily.  *If you need a refill on your cardiac medications before your next appointment, please call your pharmacy*   Lab Work: BMET when you come in to see Dr. Acie Fredrickson next week.  If you have labs (blood work) drawn today and your tests are completely normal, you will receive your results only by: Marland Kitchen MyChart Message (if you have MyChart) OR . A paper copy in the mail If you have any lab test that is abnormal or we need to change your treatment, we will call you to review the results.   Testing/Procedures: None   Follow-Up: At [facility], you and your health needs are our priority.  As part of our continuing mission to provide you with exceptional heart care, we have created designated Provider Care Teams.  These Care Teams include your primary Cardiologist (physician) and Advanced Practice Providers (APPs -  Physician Assistants and Nurse Practitioners) who all work together to provide you with the care you need, when you need it.  We recommend signing up for the patient portal called "MyChart".  Sign up information is provided on this After Visit Summary.  MyChart is used to connect with patients for Virtual Visits (Telemedicine).  Patients are able to view lab/test results, encounter notes, upcoming appointments, etc.  Non-urgent messages can be sent to your provider as well.   To learn more about what you can do with MyChart, go to [email or web address].    Your next appointment:   1 week(s)  The format for your next appointment:   In Person  Provider:   You may see Mertie Moores, MD or one of the following Advanced Practice Providers on your designated Care Team:    Kathyrn Drown, NP    Other Instructions

## 2020-12-06 ENCOUNTER — Inpatient Hospital Stay: Payer: Medicare Other

## 2020-12-06 DIAGNOSIS — D638 Anemia in other chronic diseases classified elsewhere: Secondary | ICD-10-CM

## 2020-12-06 DIAGNOSIS — D4620 Refractory anemia with excess of blasts, unspecified: Secondary | ICD-10-CM | POA: Diagnosis not present

## 2020-12-06 MED ORDER — ACETAMINOPHEN 325 MG PO TABS
ORAL_TABLET | ORAL | Status: AC
  Filled 2020-12-06: qty 2

## 2020-12-06 MED ORDER — ACETAMINOPHEN 325 MG PO TABS
650.0000 mg | ORAL_TABLET | Freq: Once | ORAL | Status: AC
  Administered 2020-12-06: 650 mg via ORAL

## 2020-12-06 MED ORDER — DIPHENHYDRAMINE HCL 25 MG PO CAPS
ORAL_CAPSULE | ORAL | Status: AC
  Filled 2020-12-06: qty 1

## 2020-12-06 MED ORDER — DIPHENHYDRAMINE HCL 25 MG PO CAPS
25.0000 mg | ORAL_CAPSULE | Freq: Once | ORAL | Status: AC
  Administered 2020-12-06: 25 mg via ORAL

## 2020-12-06 NOTE — Patient Instructions (Signed)

## 2020-12-07 LAB — TYPE AND SCREEN
ABO/RH(D): B POS
Antibody Screen: POSITIVE
DAT, IgG: NEGATIVE
Donor AG Type: NEGATIVE
Unit division: 0

## 2020-12-07 LAB — BPAM RBC
Blood Product Expiration Date: 202203302359
ISSUE DATE / TIME: 202203180747
Unit Type and Rh: 7300

## 2020-12-09 ENCOUNTER — Ambulatory Visit: Payer: Medicare Other | Admitting: Cardiovascular Disease

## 2020-12-09 ENCOUNTER — Other Ambulatory Visit: Payer: Self-pay | Admitting: Hematology and Oncology

## 2020-12-09 ENCOUNTER — Other Ambulatory Visit: Payer: Self-pay

## 2020-12-09 ENCOUNTER — Encounter: Payer: Self-pay | Admitting: Cardiovascular Disease

## 2020-12-09 VITALS — BP 100/56 | HR 77 | Ht 71.0 in | Wt 200.0 lb

## 2020-12-09 DIAGNOSIS — I480 Paroxysmal atrial fibrillation: Secondary | ICD-10-CM

## 2020-12-09 DIAGNOSIS — I5033 Acute on chronic diastolic (congestive) heart failure: Secondary | ICD-10-CM | POA: Diagnosis not present

## 2020-12-09 DIAGNOSIS — R600 Localized edema: Secondary | ICD-10-CM | POA: Insufficient documentation

## 2020-12-09 MED ORDER — TORSEMIDE 20 MG PO TABS: 20.0000 mg | ORAL_TABLET | Freq: Two times a day (BID) | ORAL | 3 refills | Status: DC

## 2020-12-09 NOTE — Progress Notes (Signed)
Cardiology Office Note:    Date:  12/09/2020   ID:  Ricky [name], DOB [birth year], MRN [id number]  PCP:  Patient, No Pcp Per  [place] Cardiologist:  New, Ricky [name]  [facility] Electrophysiologist:  None   Referring MD: No ref. provider found   Chief Complaint  Patient presents with  . Leg Swelling     02/28/20     Ricky [name] is a 87 y.o. male with a hx of atrial fib, pacer, chronic anticoagulation .   We were asked to see him today by Ricky Deutscher, NP for further management of his atrial fib and pacer.   He is on coumadin   No CP or dyspnea.  Has slowed down  Lives in [place] now.   No syncope .  Has chronic anemia Hb is 7.2 as of last month   Hx of HLD .    Very little exercise .  Due to lack of energy   Had pacer placed up in [place] several years ago.  Does not know what brand  Sept. 13, 2021:  Seen with his son, Ricky [name].  Ricky [name] is seen today for follow up of his pacer and HTN He has MDS.  Is chronically anemic.  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  No CP or dyspnea Pacer is functioning normally   Was having some leg swelling and having significant nocturia  ( see telephone note from Ricky [name])  Was started Lasix 20 mg  And he was scheduled for a follow up appt  .  Takes lasix  at 4 PM.   ( which is why it did not help with his nocturia )  Leg edema has improved  Advised him to take the lasix in the am   December 09, 2020:  Ricky [name] is seen today for follow of up his leg edema, CKD, anemia He was seen last week as a work in by Dr. Tamala [name] for worsening diastoilc CHF related to severe anemia. He was instructed to take an extra lasix on the days that he gets a transfusion. Get a transfusin every 2 weeks or so    Past Medical History:  Diagnosis Date  . A-fib ([place])   . Anemia   . Anemia of chronic disease 02/02/2020  . Atrial fibrillation ([place])   . Boerhaave's syndrome   . Chronic  anticoagulation 01/22/2020  . Colon polyps   . COVID-19   . Hx of colonic polyps 01/22/2020   2017 - [place]  . Hypomagnesemia   . Low grade myelodysplastic syndrome lesions ([place]) 02/02/2020  . Lower urinary tract symptoms due to benign prostatic hyperplasia   . Pacemaker   . Paroxysmal atrial fibrillation (HCC)   . Prostate enlargement   . Symptomatic anemia 12/26/2019  . Vitamin D deficiency     Past Surgical History:  Procedure Laterality Date  . esophogeal tear repair    . PACEMAKER INSERTION  2009, 2017  . THORACOTOMY     esophageal perforation  . TOTAL HIP ARTHROPLASTY Right   . TOTAL KNEE ARTHROPLASTY Left     Current Medications: Current Meds  Medication Sig  . apixaban (ELIQUIS) 5 MG TABS tablet Take 1 tablet (5 mg total) by mouth 2 (two) times daily.  . cholecalciferol (VITAMIN D3) 25 MCG (1000 UNIT) tablet Take 1,000 Units by mouth daily.  . cyanocobalamin 1000 MCG tablet Take 1,000 mcg by mouth daily.  Marland Kitchen  deferasirox (EXJADE) 500 MG disintegrating tablet Take 2 tablets (1,000 mg total) by mouth daily before breakfast.  . Magnesium 250 MG TABS Take 1 tablet (250 mg total) by mouth daily.  . simvastatin (ZOCOR) 20 MG tablet Take 20 mg by mouth daily.  Marland Kitchen torsemide (DEMADEX) 20 MG tablet Take 1 tablet (20 mg total) by mouth 2 (two) times daily.  . [DISCONTINUED] furosemide (LASIX) 20 MG tablet Take 1 tablet (20 mg total) by mouth daily.     Allergies:   Patient has no known allergies.   Social History   Socioeconomic History  . Marital status: Married    Spouse name: Not on file  . Number of children: 2  . Years of education: Not on file  . Highest education level: Not on file  Occupational History  . Occupation: retired  Tobacco Use  . Smoking status: Former Smoker    Types: Cigarettes    Quit date: 01/21/1970    Years since quitting: 50.9  . Smokeless tobacco: Never Used  Vaping Use  . Vaping Use: Never used  Substance and Sexual Activity  . Alcohol use: Not  Currently  . Drug use: Never  . Sexual activity: Yes  Other Topics Concern  . Not on file  Social History Narrative  . Not on file   Social Determinants of Health   Financial Resource Strain: Not on file  Food Insecurity: Not on file  Transportation Needs: Not on file  Physical Activity: Not on file  Stress: Not on file  Social Connections: Not on file     Family History: The patient's family history includes Alcoholism in his mother; Brain cancer in his daughter; Cirrhosis in his mother; Diabetes in his brother; Heart attack (age of onset: 36) in his father; Heart disease in his brother.  ROS:   Please see the history of present illness.     All other systems reviewed and are negative.  EKGs/Labs/Other Studies Reviewed:    The following studies were reviewed today:   EKG:     Recent Labs: 01/29/2020: TSH 4.308 11/23/2020: ALT 27; BUN 23; Creatinine, Ser 1.34; Potassium 3.7; Sodium 135 12/05/2020: Hemoglobin 7.3; Platelet Count 286  Recent Lipid Panel    Component Value Date/Time   CHOL 139 02/28/2020 1418   TRIG 60 02/28/2020 1418   HDL 53 02/28/2020 1418   CHOLHDL 2.6 02/28/2020 1418   LDLCALC 73 02/28/2020 1418    Physical Exam:    Physical Exam: Blood pressure (!) 100/56, pulse 77, height 5\' 11"  (1.803 m), weight 200 lb (90.7 kg), SpO2 94 %.  GEN:  Well nourished, well developed in no acute distress HEENT: Normal NECK: No JVD; No carotid bruits LYMPHATICS: No lymphadenopathy CARDIAC: RRR , no murmurs, rubs, gallops RESPIRATORY:  Clear to auscultation without rales, wheezing or rhonchi  ABDOMEN: Soft, non-tender, non-distended MUSCULOSKELETAL:  2-3+ pitting edema  SKIN: Warm and dry NEUROLOGIC:  Alert and oriented x 3    ASSESSMENT:    1. Acute on chronic diastolic heart failure ([place])    PLAN:      1.  CHF:   Exacerbated by his profound anemia.  Wt is down accoring to son  He does not elevated his legs   Has no significant diuresis with lasix.   Check basic metabolic profile today. At this point we will stop the Lasix and start him on torsemide 20 mg a day.  I have asked his son to bring him back in 3 weeks for basicmetabolic  profile. The son knows that we are looking for brisk diuresis and improvement of his leg swelling.  Level see an APP in 6 weeks.  1. Paroxysmal atrial fib : hx of PAF.    Is back in NSR today   2.   Pacemaker:  .      3.  Leg edema :  Still has edema    Medication Adjustments/Labs and Tests Ordered: Current medicines are reviewed at length with the patient today.  Concerns regarding medicines are outlined above.  Orders Placed This Encounter  Procedures  . Basic metabolic panel  . Basic metabolic panel  . EKG 12-Lead   Meds ordered this encounter  Medications  . torsemide (DEMADEX) 20 MG tablet    Sig: Take 1 tablet (20 mg total) by mouth 2 (two) times daily.    Dispense:  180 tablet    Refill:  3    Patient Instructions  Medication Instructions:  Your physician has recommended you make the following change in your medication:   STOP Lasix START Torsemide 20mg  daily   *If you need a refill on your cardiac medications before your next appointment, please call your pharmacy*   Lab Work: TODAY: BMET  Your physician recommends that you return for lab work in: 2-3 weeks 12/31/2020 anytime between 7:30-4:30pm  If you have labs (blood work) drawn today and your tests are completely normal, you will receive your results only by: Marland Kitchen MyChart Message (if you have MyChart) OR . A paper copy in the mail If you have any lab test that is abnormal or we need to change your treatment, we will call you to review the results.   Testing/Procedures: none   Follow-Up: At [facility], you and your health needs are our priority.  As part of our continuing mission to provide you with exceptional heart care, we have created designated Provider Care Teams.  These Care Teams include your primary  Cardiologist (physician) and Advanced Practice Providers (APPs -  Physician Assistants and Nurse Practitioners) who all work together to provide you with the care you need, when you need it.   Your next appointment:    01/27/2021 at 3:45pm  The format for your next appointment:   In Person  Provider:   You will see one of the following Advanced Practice Providers on your designated Care Team:    Richardson Dopp, PA-C     Signed, Mertie Moores, MD  12/09/2020 5:33 PM    [place]

## 2020-12-09 NOTE — Patient Instructions (Signed)
Medication Instructions:  Your physician has recommended you make the following change in your medication:   STOP Lasix START Torsemide 20mg  daily   *If you need a refill on your cardiac medications before your next appointment, please call your pharmacy*   Lab Work: TODAY: BMET  Your physician recommends that you return for lab work in: 2-3 weeks 12/31/2020 anytime between 7:30-4:30pm  If you have labs (blood work) drawn today and your tests are completely normal, you will receive your results only by: Marland Kitchen MyChart Message (if you have MyChart) OR . A paper copy in the mail If you have any lab test that is abnormal or we need to change your treatment, we will call you to review the results.   Testing/Procedures: none   Follow-Up: At [facility], you and your health needs are our priority.  As part of our continuing mission to provide you with exceptional heart care, we have created designated Provider Care Teams.  These Care Teams include your primary Cardiologist (physician) and Advanced Practice Providers (APPs -  Physician Assistants and Nurse Practitioners) who all work together to provide you with the care you need, when you need it.   Your next appointment:    01/27/2021 at 3:45pm  The format for your next appointment:   In Person  Provider:   You will see one of the following Advanced Practice Providers on your designated Care Team:    Richardson Dopp, [place]

## 2020-12-09 NOTE — Addendum Note (Signed)
Addended by: Mendel Ryder on: 12/09/2020 10:19 AM   Modules accepted: Orders

## 2020-12-10 ENCOUNTER — Emergency Department (HOSPITAL_COMMUNITY): Payer: Medicare Other

## 2020-12-10 ENCOUNTER — Other Ambulatory Visit: Payer: Self-pay | Admitting: *Deleted

## 2020-12-10 ENCOUNTER — Other Ambulatory Visit: Payer: Self-pay

## 2020-12-10 ENCOUNTER — Other Ambulatory Visit: Payer: Self-pay | Admitting: Hematology and Oncology

## 2020-12-10 ENCOUNTER — Emergency Department (HOSPITAL_COMMUNITY)
Admission: EM | Admit: 2020-12-10 | Discharge: 2020-12-10 | Disposition: A | Discharge status: Home / Self Care | Payer: Medicare Other | Source: Home / Self Care | Attending: Emergency Medicine | Admitting: Emergency Medicine

## 2020-12-10 DIAGNOSIS — Z20822 Contact with and (suspected) exposure to covid-19: Secondary | ICD-10-CM | POA: Diagnosis not present

## 2020-12-10 DIAGNOSIS — R29810 Facial weakness: Secondary | ICD-10-CM | POA: Diagnosis not present

## 2020-12-10 DIAGNOSIS — Z85828 Personal history of other malignant neoplasm of skin: Secondary | ICD-10-CM | POA: Insufficient documentation

## 2020-12-10 DIAGNOSIS — I5042 Chronic combined systolic (congestive) and diastolic (congestive) heart failure: Secondary | ICD-10-CM | POA: Diagnosis not present

## 2020-12-10 DIAGNOSIS — Z950 Presence of cardiac pacemaker: Secondary | ICD-10-CM | POA: Insufficient documentation

## 2020-12-10 DIAGNOSIS — G459 Transient cerebral ischemic attack, unspecified: Secondary | ICD-10-CM | POA: Diagnosis present

## 2020-12-10 DIAGNOSIS — I2510 Atherosclerotic heart disease of native coronary artery without angina pectoris: Secondary | ICD-10-CM | POA: Insufficient documentation

## 2020-12-10 DIAGNOSIS — M109 Gout, unspecified: Secondary | ICD-10-CM | POA: Diagnosis not present

## 2020-12-10 DIAGNOSIS — S2232XA Fracture of one rib, left side, initial encounter for closed fracture: Secondary | ICD-10-CM

## 2020-12-10 DIAGNOSIS — R918 Other nonspecific abnormal finding of lung field: Secondary | ICD-10-CM

## 2020-12-10 DIAGNOSIS — R1012 Left upper quadrant pain: Secondary | ICD-10-CM | POA: Insufficient documentation

## 2020-12-10 DIAGNOSIS — Z87891 Personal history of nicotine dependence: Secondary | ICD-10-CM | POA: Insufficient documentation

## 2020-12-10 DIAGNOSIS — Z8616 Personal history of COVID-19: Secondary | ICD-10-CM | POA: Insufficient documentation

## 2020-12-10 DIAGNOSIS — R296 Repeated falls: Secondary | ICD-10-CM | POA: Diagnosis not present

## 2020-12-10 DIAGNOSIS — Z8249 Family history of ischemic heart disease and other diseases of the circulatory system: Secondary | ICD-10-CM | POA: Diagnosis not present

## 2020-12-10 DIAGNOSIS — G9341 Metabolic encephalopathy: Secondary | ICD-10-CM | POA: Diagnosis not present

## 2020-12-10 DIAGNOSIS — Z96652 Presence of left artificial knee joint: Secondary | ICD-10-CM | POA: Insufficient documentation

## 2020-12-10 DIAGNOSIS — I480 Paroxysmal atrial fibrillation: Secondary | ICD-10-CM | POA: Diagnosis not present

## 2020-12-10 DIAGNOSIS — R6520 Severe sepsis without septic shock: Secondary | ICD-10-CM | POA: Diagnosis not present

## 2020-12-10 DIAGNOSIS — Z8673 Personal history of transient ischemic attack (TIA), and cerebral infarction without residual deficits: Secondary | ICD-10-CM | POA: Diagnosis not present

## 2020-12-10 DIAGNOSIS — Z96641 Presence of right artificial hip joint: Secondary | ICD-10-CM | POA: Insufficient documentation

## 2020-12-10 DIAGNOSIS — K8020 Calculus of gallbladder without cholecystitis without obstruction: Secondary | ICD-10-CM | POA: Diagnosis not present

## 2020-12-10 DIAGNOSIS — E782 Mixed hyperlipidemia: Secondary | ICD-10-CM | POA: Diagnosis not present

## 2020-12-10 DIAGNOSIS — S20212A Contusion of left front wall of thorax, initial encounter: Secondary | ICD-10-CM | POA: Insufficient documentation

## 2020-12-10 DIAGNOSIS — Z7901 Long term (current) use of anticoagulants: Secondary | ICD-10-CM | POA: Diagnosis not present

## 2020-12-10 DIAGNOSIS — W1811XA Fall from or off toilet without subsequent striking against object, initial encounter: Secondary | ICD-10-CM | POA: Insufficient documentation

## 2020-12-10 DIAGNOSIS — J189 Pneumonia, unspecified organism: Secondary | ICD-10-CM | POA: Diagnosis not present

## 2020-12-10 DIAGNOSIS — R911 Solitary pulmonary nodule: Secondary | ICD-10-CM

## 2020-12-10 DIAGNOSIS — W19XXXA Unspecified fall, initial encounter: Secondary | ICD-10-CM

## 2020-12-10 DIAGNOSIS — A419 Sepsis, unspecified organism: Secondary | ICD-10-CM | POA: Diagnosis not present

## 2020-12-10 DIAGNOSIS — Z79899 Other long term (current) drug therapy: Secondary | ICD-10-CM | POA: Diagnosis not present

## 2020-12-10 DIAGNOSIS — D469 Myelodysplastic syndrome, unspecified: Secondary | ICD-10-CM | POA: Diagnosis not present

## 2020-12-10 LAB — URINALYSIS, ROUTINE W REFLEX MICROSCOPIC
Bacteria, UA: NONE SEEN
Bilirubin Urine: NEGATIVE
Glucose, UA: NEGATIVE mg/dL
Ketones, ur: NEGATIVE mg/dL
Leukocytes,Ua: NEGATIVE
Nitrite: NEGATIVE
Protein, ur: NEGATIVE mg/dL
Specific Gravity, Urine: 1.012 (ref 1.005–1.030)
pH: 7 (ref 5.0–8.0)

## 2020-12-10 LAB — BASIC METABOLIC PANEL
Anion gap: 6 (ref 5–15)
BUN/Creatinine Ratio: 12 (ref 10–24)
BUN: 12 mg/dL (ref 8–27)
BUN: 11 mg/dL (ref 8–23)
CO2: 22 mmol/L (ref 20–29)
CO2: 27 mmol/L (ref 22–32)
Calcium: 7.7 mg/dL — ABNORMAL LOW (ref 8.6–10.2)
Calcium: 8.2 mg/dL — ABNORMAL LOW (ref 8.9–10.3)
Chloride: 98 mmol/L (ref 96–106)
Chloride: 103 mmol/L (ref 98–111)
Creatinine, Ser: 0.97 mg/dL (ref 0.76–1.27)
Creatinine, Ser: 0.9 mg/dL (ref 0.61–1.24)
GFR, Estimated: >60 mL/min (ref >60)
Glucose, Bld: 118 mg/dL — ABNORMAL HIGH (ref 70–99)
Glucose: 105 mg/dL — ABNORMAL HIGH (ref 65–99)
Potassium: 4.2 mmol/L (ref 3.5–5.2)
Potassium: 3.8 mmol/L (ref 3.5–5.1)
Sodium: 133 mmol/L — ABNORMAL LOW (ref 134–144)
Sodium: 136 mmol/L (ref 135–145)
eGFR: 76 mL/min/{1.73_m2} (ref >59)

## 2020-12-10 LAB — CBC
HCT: 25.2 % — ABNORMAL LOW (ref 39.0–52.0)
Hemoglobin: 7.9 g/dL — ABNORMAL LOW (ref 13.0–17.0)
MCH: 28.6 pg (ref 26.0–34.0)
MCHC: 31.3 g/dL (ref 30.0–36.0)
MCV: 91.3 fL (ref 80.0–100.0)
Platelets: 273 10*3/uL (ref 150–400)
RBC: 2.76 MIL/uL — ABNORMAL LOW (ref 4.22–5.81)
RDW: 19.2 % — ABNORMAL HIGH (ref 11.5–15.5)
WBC: 12.8 10*3/uL — ABNORMAL HIGH (ref 4.0–10.5)
nRBC: 0 % (ref 0.0–0.2)

## 2020-12-10 LAB — SAMPLE TO BLOOD BANK

## 2020-12-10 MED ORDER — TRAMADOL HCL 50 MG PO TABS
50.0000 mg | ORAL_TABLET | Freq: Two times a day (BID) | ORAL | 0 refills | Status: DC | PRN
Start: 2020-12-10 — End: 2021-10-09

## 2020-12-10 MED ORDER — IOHEXOL 300 MG/ML  SOLN
100.0000 mL | Freq: Once | INTRAMUSCULAR | Status: AC | PRN
  Administered 2020-12-10: 100 mL via INTRAVENOUS

## 2020-12-10 NOTE — Discharge Instructions (Addendum)
Dr. Lindi Adie will contact you about follow up.

## 2020-12-10 NOTE — ED Provider Notes (Addendum)
[facility] EMERGENCY DEPARTMENT Provider Note   CSN: [id number] Arrival date & time: 12/10/20  1037     History Chief Complaint  Patient presents with  . [place] between toilet and bathtub at [facility], was there for about 30 mins, no loc, denies any pain, is on blood thinners    Ricky [name] is a 87 y.o. male.  HPI    Patient presents after a fall.  Reportedly fell last night and was lodged between the toilet and bathtub.  Denies hitting his head.  Denies loss conscious.  Is on anticoagulation.  Does have pain in left upper abdomen and left lower chest.  No neck pain.  No numbness weakness.  States it does hurt a little to take of breath.  Is on anticoagulation for atrial fibrillation.    Past Medical History:  Diagnosis Date  . A-fib ([place])   . Anemia   . Anemia of chronic disease 02/02/2020  . Atrial fibrillation ([place])   . Boerhaave's syndrome   . Chronic anticoagulation 01/22/2020  . Colon polyps   . COVID-19   . Hx of colonic polyps 01/22/2020   2017 - [place]  . Hypomagnesemia   . Low grade myelodysplastic syndrome lesions ([place]) 02/02/2020  . Lower urinary tract symptoms due to benign prostatic hyperplasia   . Pacemaker   . Paroxysmal atrial fibrillation ([facility])   . Prostate enlargement   . Symptomatic anemia 12/26/2019  . Vitamin D deficiency     Patient Active Problem List   Diagnosis Date Noted  . Leg edema 12/09/2020  . AKI (acute kidney injury) ([place]) 11/22/2020  . Thrombocytopenia ([place]) 11/22/2020  . Benign neoplasm of trunk 07/05/2020  . Malignant melanoma of skin of upper limb, including shoulder ([place]) 07/05/2020  . Actinic keratosis 07/05/2020  . Basal cell carcinoma of scalp 07/05/2020  . Benign neoplasm of skin of face 07/05/2020  . BPH with urinary obstruction 07/05/2020  . History of malignant melanoma 07/05/2020  . Knee joint replacement status, left 07/05/2020  . Neoplasm of uncertain behavior of skin 07/05/2020  . Sinus node  dysfunction ([place]) 03/28/2020  . Pacemaker 03/28/2020  . Anemia of chronic disease 02/02/2020  . Low grade myelodysplastic syndrome lesions ([place]) 02/02/2020  . Atrial fibrillation ([place]) 01/22/2020  . Chronic anticoagulation 01/22/2020  . Hx of colonic polyps 01/22/2020  . Symptomatic anemia 12/26/2019  . Coronary artery disease involving native coronary artery 08/24/2015  . Hyperlipidemia 08/24/2015  . Primary osteoarthritis of left knee 08/08/2015  . Boerhaave syndrome 01/27/2006    Past Surgical History:  Procedure Laterality Date  . esophogeal tear repair    . PACEMAKER INSERTION  2009, 2017  . THORACOTOMY     esophageal perforation  . TOTAL HIP ARTHROPLASTY Right   . TOTAL KNEE ARTHROPLASTY Left        Family History  Problem Relation Age of Onset  . Cirrhosis Mother   . Alcoholism Mother   . Heart attack Father 28  . Heart disease Brother   . Diabetes Brother   . Brain cancer Daughter     Social History   Tobacco Use  . Smoking status: Former Smoker    Types: Cigarettes    Quit date: 01/21/1970    Years since quitting: 50.9  . Smokeless tobacco: Never Used  Vaping Use  . Vaping Use: Never used  Substance Use Topics  . Alcohol use: Not Currently  . Drug use: Never    Home Medications  Prior to Admission medications   Medication Sig Start Date End Date Taking? Authorizing Provider  apixaban (ELIQUIS) 5 MG TABS tablet Take 1 tablet (5 mg total) by mouth 2 (two) times daily. 02/29/20   Nahser, Wonda Cheng, MD  cholecalciferol (VITAMIN D3) 25 MCG (1000 UNIT) tablet Take 1,000 Units by mouth daily.    [provider]  cyanocobalamin 1000 MCG tablet Take 1,000 mcg by mouth daily.    [provider]  deferasirox (EXJADE) 500 MG disintegrating tablet Take 2 tablets (1,000 mg total) by mouth daily before breakfast. 07/26/20   Nicholas Lose, MD  Magnesium 250 MG TABS Take 1 tablet (250 mg total) by mouth daily. 12/27/19   Barb Merino, MD  simvastatin  (ZOCOR) 20 MG tablet Take 20 mg by mouth daily.    [provider]  torsemide (DEMADEX) 20 MG tablet Take 1 tablet (20 mg total) by mouth 2 (two) times daily. 12/09/20   Nahser, Wonda Cheng, MD    Allergies    Patient has no known allergies.  Review of Systems   Review of Systems  Constitutional: Negative for appetite change.  HENT: Negative for congestion.   Cardiovascular: Positive for chest pain.  Gastrointestinal: Positive for abdominal pain.  Genitourinary: Negative for flank pain.  Musculoskeletal: Negative for back pain.  Skin: Negative for rash.  Neurological: Negative for syncope.  Psychiatric/Behavioral: Negative for confusion.    Physical Exam Updated Vital Signs BP 119/61   Pulse 79   Temp 98.2 F (36.8 C) (Oral)   Resp (!) 26   Ht 5\' 11"  (1.803 m)   Wt 90.7 kg   SpO2 98%   BMI 27.89 kg/m   Physical Exam Vitals reviewed.  HENT:     Head: Atraumatic.     Right Ear: External ear normal.     Left Ear: External ear normal.     Mouth/Throat:     Mouth: Mucous membranes are moist.  Eyes:     Pupils: Pupils are equal, round, and reactive to light.  Neck:     Comments: No midline tenderness.  Pain with range of motion. Cardiovascular:     Rate and Rhythm: Normal rate.  Pulmonary:     Comments: Tenderness to left lateral anterior lower chest wall.  No crepitance or deformity.  Equal breath sounds. Chest:     Chest wall: Tenderness present.  Abdominal:     Tenderness: There is abdominal tenderness.     Comments: Left upper quadrant tenderness without rebound or guarding.  Musculoskeletal:        General: No tenderness.     Cervical back: Neck supple.  Skin:    General: Skin is warm.     Capillary Refill: Capillary refill takes less than 2 seconds.  Neurological:     Mental Status: He is alert.     Comments: Awake and pleasant answers questions.  May have some mild confusion.  Psychiatric:        Mood and Affect: Mood normal.     ED Results /  Procedures / Treatments   Labs (all labs ordered are listed, but only abnormal results are displayed) Labs Reviewed  BASIC METABOLIC PANEL - Abnormal; Notable for the following components:      Result Value   Glucose, Bld 118 (*)    Calcium 8.2 (*)    All other components within normal limits  CBC - Abnormal; Notable for the following components:   WBC 12.8 (*)    RBC 2.76 (*)  Hemoglobin 7.9 (*)    HCT 25.2 (*)    RDW 19.2 (*)    All other components within normal limits  URINALYSIS, ROUTINE W REFLEX MICROSCOPIC - Abnormal; Notable for the following components:   Hgb urine dipstick SMALL (*)    All other components within normal limits  SAMPLE TO BLOOD BANK    EKG None  Radiology DG Chest Portable 1 View  Result Date: 12/10/2020 CLINICAL DATA:  [place] today. EXAM: PORTABLE CHEST 1 VIEW COMPARISON:  Chest x-ray 09/16/2020 FINDINGS: The heart is borderline enlarged. Stable tortuosity and calcification of the thoracic aorta. The pacer wires are in good position, unchanged. Left basilar opacity could be a combination of atelectasis, infiltrate and effusion. The right lung is grossly clear. No pneumothorax identified. The bony thorax is intact.  No obvious acute fractures. IMPRESSION: Left basilar opacity likely a combination of atelectasis and effusion. Grossly intact bony thorax. Electronically Signed   By: Marijo Sanes M.D.   On: 12/10/2020 11:56    Procedures Procedures   Medications Ordered in ED Medications  iohexol (OMNIPAQUE) 300 MG/ML solution 100 mL (100 mLs Intravenous Contrast Given 12/10/20 1424)    ED Course  I have reviewed the triage vital signs and the nursing notes.  Pertinent labs & imaging results that were available during my care of the patient were reviewed by me and considered in my medical decision making (see chart for details).    MDM Rules/Calculators/A&P                          Patient presents after fall.  On anticoagulation.  Reportedly was  between the toilet and bathtub.  Complaining of pain on the left lower chest and upper abdomen.  CT scans done.  Results still pending.  Head CT done due to anticoagulation and some mild confusion.  Care will be turned over to Dr. Langston Masker  CT scan has now been done and shows lung mass.  Discussed with patient and his son.  They feel patient be better managed as an outpatient and I agree.  Will likely need further imaging.  Determine if this is mass such as lung cancer or other infection.  No fevers or chills and white count is mildly elevated but near where it has been.  We will not empirically treat with antibiotics.  However instructed patient and son if develops fever I could have to come back to the hospital.  I called his oncologist Dr. Quintella Reichert but have not been able to hear back yet.  If does not hear back soon will have just follow-up as an outpatient.  Final Clinical Impression(s) / ED Diagnoses Final diagnoses:  Fall, initial encounter  Contusion of left chest wall, initial encounter    Rx / DC Orders ED Discharge Orders    None       Davonna Belling, MD 12/10/20 1447    Davonna Belling, MD 12/10/20 1527

## 2020-12-10 NOTE — ED Triage Notes (Signed)
Fell off toilet around around 0930 this am and was on the floor between toilet and tub for about 30 minutes, hit left side of ribs on the hand rail, no loc, oriented to self and year

## 2020-12-10 NOTE — Progress Notes (Signed)
Per MD request orders placed for PET scan due to recent chest CT showing a large left lower lobe lung mass and enlarged left hilar lymph nodes with prominent cluster of left paratracheal lymph nodes.

## 2020-12-10 NOTE — Progress Notes (Signed)
In the emergency room he was found to have left posterior and lateral pleural thickening and necrotic appearing lung mass in the central left lower lobe measuring 6.7 cm.  Enlarged left hilar lymph node 1.4 cm. I spoke to Dr. Valeta Harms who is willing to see him for consultation and can plan for a bronchoscopy and a biopsy. We will try to obtain a PET CT scan

## 2020-12-11 ENCOUNTER — Telehealth: Payer: Self-pay | Admitting: Hospice

## 2020-12-11 DIAGNOSIS — Z515 Encounter for palliative care: Secondary | ICD-10-CM

## 2020-12-11 NOTE — Telephone Encounter (Signed)
NP called patient's son Ed providing clarification as well as support as patient continues to decline.  Patient continues to get help with ambulation for strengthening/ balance and assistance with ADLs from [facility] in the facility. Other expert consults are lined up for next week. Palliative in person visit scheduled in two weeks to further discuss patient's status and goals of care clarification.

## 2020-12-12 ENCOUNTER — Emergency Department (HOSPITAL_COMMUNITY): Payer: Medicare Other

## 2020-12-12 ENCOUNTER — Inpatient Hospital Stay: Payer: Medicare Other

## 2020-12-12 ENCOUNTER — Other Ambulatory Visit: Payer: Medicare Other

## 2020-12-12 ENCOUNTER — Other Ambulatory Visit: Payer: Self-pay

## 2020-12-12 ENCOUNTER — Encounter: Payer: Self-pay | Admitting: Hematology and Oncology

## 2020-12-12 ENCOUNTER — Inpatient Hospital Stay (HOSPITAL_COMMUNITY)
Admission: EM | Admit: 2020-12-12 | Discharge: 2020-12-17 | DRG: 853 | Disposition: A | Discharge status: Skilled Nursing Facility | Payer: Medicare Other | Attending: Internal Medicine | Admitting: Internal Medicine

## 2020-12-12 ENCOUNTER — Ambulatory Visit: Payer: Medicare Other | Admitting: Hematology and Oncology

## 2020-12-12 ENCOUNTER — Encounter (HOSPITAL_COMMUNITY): Payer: Self-pay | Admitting: Emergency Medicine

## 2020-12-12 ENCOUNTER — Telehealth: Payer: Self-pay | Admitting: *Deleted

## 2020-12-12 VITALS — BP 112/61 | HR 90 | Temp 98.2°F | Resp 16

## 2020-12-12 DIAGNOSIS — Z20822 Contact with and (suspected) exposure to covid-19: Secondary | ICD-10-CM | POA: Diagnosis present

## 2020-12-12 DIAGNOSIS — R296 Repeated falls: Secondary | ICD-10-CM

## 2020-12-12 DIAGNOSIS — Z8673 Personal history of transient ischemic attack (TIA), and cerebral infarction without residual deficits: Secondary | ICD-10-CM

## 2020-12-12 DIAGNOSIS — G9341 Metabolic encephalopathy: Secondary | ICD-10-CM | POA: Diagnosis present

## 2020-12-12 DIAGNOSIS — M7989 Other specified soft tissue disorders: Secondary | ICD-10-CM

## 2020-12-12 DIAGNOSIS — M25431 Effusion, right wrist: Secondary | ICD-10-CM

## 2020-12-12 DIAGNOSIS — D469 Myelodysplastic syndrome, unspecified: Secondary | ICD-10-CM | POA: Diagnosis present

## 2020-12-12 DIAGNOSIS — R590 Localized enlarged lymph nodes: Secondary | ICD-10-CM | POA: Diagnosis present

## 2020-12-12 DIAGNOSIS — Z8616 Personal history of COVID-19: Secondary | ICD-10-CM

## 2020-12-12 DIAGNOSIS — M25531 Pain in right wrist: Secondary | ICD-10-CM

## 2020-12-12 DIAGNOSIS — M25512 Pain in left shoulder: Secondary | ICD-10-CM

## 2020-12-12 DIAGNOSIS — I5042 Chronic combined systolic (congestive) and diastolic (congestive) heart failure: Secondary | ICD-10-CM | POA: Diagnosis present

## 2020-12-12 DIAGNOSIS — A419 Sepsis, unspecified organism: Principal | ICD-10-CM | POA: Diagnosis present

## 2020-12-12 DIAGNOSIS — Z8249 Family history of ischemic heart disease and other diseases of the circulatory system: Secondary | ICD-10-CM

## 2020-12-12 DIAGNOSIS — Z96641 Presence of right artificial hip joint: Secondary | ICD-10-CM | POA: Diagnosis present

## 2020-12-12 DIAGNOSIS — D4620 Refractory anemia with excess of blasts, unspecified: Secondary | ICD-10-CM

## 2020-12-12 DIAGNOSIS — Z7901 Long term (current) use of anticoagulants: Secondary | ICD-10-CM

## 2020-12-12 DIAGNOSIS — Z9889 Other specified postprocedural states: Secondary | ICD-10-CM

## 2020-12-12 DIAGNOSIS — Z79899 Other long term (current) drug therapy: Secondary | ICD-10-CM

## 2020-12-12 DIAGNOSIS — Z950 Presence of cardiac pacemaker: Secondary | ICD-10-CM

## 2020-12-12 DIAGNOSIS — Y92009 Unspecified place in unspecified non-institutional (private) residence as the place of occurrence of the external cause: Secondary | ICD-10-CM

## 2020-12-12 DIAGNOSIS — D72829 Elevated white blood cell count, unspecified: Secondary | ICD-10-CM

## 2020-12-12 DIAGNOSIS — R6520 Severe sepsis without septic shock: Secondary | ICD-10-CM | POA: Diagnosis present

## 2020-12-12 DIAGNOSIS — M109 Gout, unspecified: Secondary | ICD-10-CM | POA: Diagnosis present

## 2020-12-12 DIAGNOSIS — I5032 Chronic diastolic (congestive) heart failure: Secondary | ICD-10-CM | POA: Diagnosis present

## 2020-12-12 DIAGNOSIS — G459 Transient cerebral ischemic attack, unspecified: Secondary | ICD-10-CM | POA: Diagnosis not present

## 2020-12-12 DIAGNOSIS — Z87891 Personal history of nicotine dependence: Secondary | ICD-10-CM

## 2020-12-12 DIAGNOSIS — E782 Mixed hyperlipidemia: Secondary | ICD-10-CM | POA: Diagnosis present

## 2020-12-12 DIAGNOSIS — I2510 Atherosclerotic heart disease of native coronary artery without angina pectoris: Secondary | ICD-10-CM | POA: Diagnosis present

## 2020-12-12 DIAGNOSIS — W19XXXA Unspecified fall, initial encounter: Secondary | ICD-10-CM

## 2020-12-12 DIAGNOSIS — R918 Other nonspecific abnormal finding of lung field: Secondary | ICD-10-CM

## 2020-12-12 DIAGNOSIS — K8020 Calculus of gallbladder without cholecystitis without obstruction: Secondary | ICD-10-CM | POA: Diagnosis present

## 2020-12-12 DIAGNOSIS — R10812 Left upper quadrant abdominal tenderness: Secondary | ICD-10-CM | POA: Diagnosis present

## 2020-12-12 DIAGNOSIS — J189 Pneumonia, unspecified organism: Secondary | ICD-10-CM

## 2020-12-12 DIAGNOSIS — Z96652 Presence of left artificial knee joint: Secondary | ICD-10-CM | POA: Diagnosis present

## 2020-12-12 DIAGNOSIS — R29810 Facial weakness: Secondary | ICD-10-CM | POA: Diagnosis present

## 2020-12-12 DIAGNOSIS — I480 Paroxysmal atrial fibrillation: Secondary | ICD-10-CM | POA: Diagnosis present

## 2020-12-12 LAB — COMPREHENSIVE METABOLIC PANEL
ALT: 57 U/L — ABNORMAL HIGH (ref 0–44)
AST: 27 U/L (ref 15–41)
Albumin: 2.9 g/dL — ABNORMAL LOW (ref 3.5–5.0)
Alkaline Phosphatase: 109 U/L (ref 38–126)
Anion gap: 9 (ref 5–15)
BUN: 22 mg/dL (ref 8–23)
CO2: 26 mmol/L (ref 22–32)
Calcium: 8.5 mg/dL — ABNORMAL LOW (ref 8.9–10.3)
Chloride: 96 mmol/L — ABNORMAL LOW (ref 98–111)
Creatinine, Ser: 1.39 mg/dL — ABNORMAL HIGH (ref 0.61–1.24)
GFR, Estimated: 49 mL/min — ABNORMAL LOW (ref >60)
Glucose, Bld: 132 mg/dL — ABNORMAL HIGH (ref 70–99)
Potassium: 4 mmol/L (ref 3.5–5.1)
Sodium: 131 mmol/L — ABNORMAL LOW (ref 135–145)
Total Bilirubin: 1.1 mg/dL (ref 0.3–1.2)
Total Protein: 8.5 g/dL — ABNORMAL HIGH (ref 6.5–8.1)

## 2020-12-12 LAB — URINALYSIS, ROUTINE W REFLEX MICROSCOPIC
Bacteria, UA: NONE SEEN
Bilirubin Urine: NEGATIVE
Glucose, UA: NEGATIVE mg/dL
Ketones, ur: NEGATIVE mg/dL
Leukocytes,Ua: NEGATIVE
Nitrite: NEGATIVE
Protein, ur: NEGATIVE mg/dL
Specific Gravity, Urine: 1.029 (ref 1.005–1.030)
pH: 5 (ref 5.0–8.0)

## 2020-12-12 LAB — CBC WITH DIFFERENTIAL/PLATELET
Abs Immature Granulocytes: 0 10*3/uL (ref 0.00–0.07)
Basophils Absolute: 0 10*3/uL (ref 0.0–0.1)
Basophils Relative: 0 %
Eosinophils Absolute: 0 10*3/uL (ref 0.0–0.5)
Eosinophils Relative: 0 %
HCT: 27.2 % — ABNORMAL LOW (ref 39.0–52.0)
Hemoglobin: 8.5 g/dL — ABNORMAL LOW (ref 13.0–17.0)
Lymphocytes Relative: 8 %
Lymphs Abs: 1.8 10*3/uL (ref 0.7–4.0)
MCH: 28.6 pg (ref 26.0–34.0)
MCHC: 31.3 g/dL (ref 30.0–36.0)
MCV: 91.6 fL (ref 80.0–100.0)
Monocytes Absolute: 2.3 10*3/uL — ABNORMAL HIGH (ref 0.1–1.0)
Monocytes Relative: 10 %
Neutro Abs: 18.6 10*3/uL — ABNORMAL HIGH (ref 1.7–7.7)
Neutrophils Relative %: 82 %
Platelets: 277 10*3/uL (ref 150–400)
RBC: 2.97 MIL/uL — ABNORMAL LOW (ref 4.22–5.81)
RDW: 19 % — ABNORMAL HIGH (ref 11.5–15.5)
WBC: 22.7 10*3/uL — ABNORMAL HIGH (ref 4.0–10.5)
nRBC: 0 /100 WBC
nRBC: 0.1 % (ref 0.0–0.2)

## 2020-12-12 LAB — ETHANOL: Alcohol, Ethyl (B): <10 mg/dL (ref <10)

## 2020-12-12 LAB — LACTIC ACID, PLASMA: Lactic Acid, Venous: 1.4 mmol/L (ref 0.5–1.9)

## 2020-12-12 LAB — RAPID URINE DRUG SCREEN, HOSP PERFORMED
Amphetamines: NOT DETECTED
Barbiturates: NOT DETECTED
Benzodiazepines: NOT DETECTED
Cocaine: NOT DETECTED
Opiates: NOT DETECTED
Tetrahydrocannabinol: NOT DETECTED

## 2020-12-12 LAB — I-STAT CHEM 8, ED
BUN: 27 mg/dL — ABNORMAL HIGH (ref 8–23)
Calcium, Ion: 1.1 mmol/L — ABNORMAL LOW (ref 1.15–1.40)
Chloride: 95 mmol/L — ABNORMAL LOW (ref 98–111)
Creatinine, Ser: 1.2 mg/dL (ref 0.61–1.24)
Glucose, Bld: 125 mg/dL — ABNORMAL HIGH (ref 70–99)
HCT: 23 % — ABNORMAL LOW (ref 39.0–52.0)
Hemoglobin: 7.8 g/dL — ABNORMAL LOW (ref 13.0–17.0)
Potassium: 4 mmol/L (ref 3.5–5.1)
Sodium: 133 mmol/L — ABNORMAL LOW (ref 135–145)
TCO2: 28 mmol/L (ref 22–32)

## 2020-12-12 LAB — APTT: aPTT: 40 seconds — ABNORMAL HIGH (ref 24–36)

## 2020-12-12 LAB — PROTIME-INR
INR: 1.7 — ABNORMAL HIGH (ref 0.8–1.2)
Prothrombin Time: 19.6 seconds — ABNORMAL HIGH (ref 11.4–15.2)

## 2020-12-12 LAB — RESP PANEL BY RT-PCR (FLU A&B, COVID) ARPGX2
Influenza A by PCR: NEGATIVE
Influenza B by PCR: NEGATIVE
SARS Coronavirus 2 by RT PCR: NEGATIVE

## 2020-12-12 MED ORDER — IOHEXOL 350 MG/ML SOLN
60.0000 mL | Freq: Once | INTRAVENOUS | Status: AC | PRN
  Administered 2020-12-12: 60 mL via INTRAVENOUS

## 2020-12-12 MED ORDER — DARBEPOETIN ALFA 500 MCG/ML IJ SOSY
PREFILLED_SYRINGE | INTRAMUSCULAR | Status: AC
  Filled 2020-12-12: qty 1

## 2020-12-12 MED ORDER — DARBEPOETIN ALFA 500 MCG/ML IJ SOSY
500.0000 ug | PREFILLED_SYRINGE | Freq: Once | INTRAMUSCULAR | Status: AC
  Administered 2020-12-12: 500 ug via SUBCUTANEOUS

## 2020-12-12 NOTE — ED Provider Notes (Signed)
[facility] EMERGENCY DEPARTMENT Provider Note   CSN: [id number] Arrival date & time: 12/12/20  1352     History No chief complaint on file.   Ricky [name] is a 87 y.o. male.  HPI   This patient is an 87 year old male with a history of atrial fibrillation on Eliquis, he also has a history of a pacemaker and has had a low-grade myelodysplastic syndrome, malignant melanoma and most recently was seen in the emergency department 2 days ago after having an accidental fall in the bathroom where he was lodged between the toilet and the bathtub.  There was no head injury, imaging at that time revealed no signs of brain injury or stroke or aneurysm or bleeding, there was an incidental lung mass that was seen and pursued as an outpatient.  The patient presents in the care of his son again today after having another fall.  He was in his usual state of health this morning, accounting for his recent visit with a new left lateral eighth rib fracture.  He also had multiple subacute to chronic right lateral rib fractures.  The son states that the patient lives with his spouse, he was ambulating to the bathroom, he never came out of the bathroom, his spouse went to check on him and found him on the floor.  The patient states he landed on his knees but could not get up off of the floor, there was no head injury.  He comes to the hospital today for evaluation of progressive weakness, recurrent falls.  No fevers vomiting or diarrhea, he has some swelling in his lower extremities for which she takes a diuretic.  Electronic medical record reviewed and shows that the patient did have a large necrotic appearing lung mass in the central left lower lobe that was concerning for a primary bronchogenic carcinoma on the CT scan from 2 days ago.  Past Medical History:  Diagnosis Date  . A-fib ([place])   . Anemia   . Anemia of chronic disease 02/02/2020  . Atrial fibrillation ([place])   .  Boerhaave's syndrome   . Chronic anticoagulation 01/22/2020  . Colon polyps   . COVID-19   . Hx of colonic polyps 01/22/2020   2017 - [place]  . Hypomagnesemia   . Low grade myelodysplastic syndrome lesions ([place]) 02/02/2020  . Lower urinary tract symptoms due to benign prostatic hyperplasia   . Pacemaker   . Paroxysmal atrial fibrillation ([facility])   . Prostate enlargement   . Symptomatic anemia 12/26/2019  . Vitamin D deficiency     Patient Active Problem List   Diagnosis Date Noted  . Leg edema 12/09/2020  . AKI (acute kidney injury) ([place]) 11/22/2020  . Thrombocytopenia ([place]) 11/22/2020  . Benign neoplasm of trunk 07/05/2020  . Malignant melanoma of skin of upper limb, including shoulder ([place]) 07/05/2020  . Actinic keratosis 07/05/2020  . Basal cell carcinoma of scalp 07/05/2020  . Benign neoplasm of skin of face 07/05/2020  . BPH with urinary obstruction 07/05/2020  . History of malignant melanoma 07/05/2020  . Knee joint replacement status, left 07/05/2020  . Neoplasm of uncertain behavior of skin 07/05/2020  . Sinus node dysfunction ([place]) 03/28/2020  . Pacemaker 03/28/2020  . Anemia of chronic disease 02/02/2020  . Low grade myelodysplastic syndrome lesions ([place]) 02/02/2020  . Atrial fibrillation ([place]) 01/22/2020  . Chronic anticoagulation 01/22/2020  . Hx of colonic polyps 01/22/2020  . Symptomatic anemia 12/26/2019  . Coronary artery disease involving native coronary  artery 08/24/2015  . Hyperlipidemia 08/24/2015  . Primary osteoarthritis of left knee 08/08/2015  . Boerhaave syndrome 01/27/2006    Past Surgical History:  Procedure Laterality Date  . esophogeal tear repair    . PACEMAKER INSERTION  2009, 2017  . THORACOTOMY     esophageal perforation  . TOTAL HIP ARTHROPLASTY Right   . TOTAL KNEE ARTHROPLASTY Left        Family History  Problem Relation Age of Onset  . Cirrhosis Mother   . Alcoholism Mother   . Heart attack Father 93  . Heart disease Brother   .  Diabetes Brother   . Brain cancer Daughter     Social History   Tobacco Use  . Smoking status: Former Smoker    Types: Cigarettes    Quit date: 01/21/1970    Years since quitting: 50.9  . Smokeless tobacco: Never Used  Vaping Use  . Vaping Use: Never used  Substance Use Topics  . Alcohol use: Not Currently  . Drug use: Never    Home Medications Prior to Admission medications   Medication Sig Start Date End Date Taking? Authorizing Provider  apixaban (ELIQUIS) 5 MG TABS tablet Take 1 tablet (5 mg total) by mouth 2 (two) times daily. 02/29/20   Nahser, Wonda Cheng, MD  cholecalciferol (VITAMIN D3) 25 MCG (1000 UNIT) tablet Take 1,000 Units by mouth daily.    [provider]  cyanocobalamin 1000 MCG tablet Take 1,000 mcg by mouth daily.    [provider]  deferasirox (EXJADE) 500 MG disintegrating tablet Take 2 tablets (1,000 mg total) by mouth daily before breakfast. 07/26/20   Nicholas Lose, MD  Magnesium 250 MG TABS Take 1 tablet (250 mg total) by mouth daily. 12/27/19   Barb Merino, MD  simvastatin (ZOCOR) 20 MG tablet Take 20 mg by mouth daily.    [provider]  torsemide (DEMADEX) 20 MG tablet Take 1 tablet (20 mg total) by mouth 2 (two) times daily. 12/09/20   Nahser, Wonda Cheng, MD  traMADol (ULTRAM) 50 MG tablet Take 1 tablet (50 mg total) by mouth every 12 (twelve) hours as needed. 12/10/20   Davonna Belling, MD    Allergies    Patient has no known allergies.  Review of Systems   Review of Systems  All other systems reviewed and are negative.   Physical Exam Updated Vital Signs BP 102/62 (BP Location: Right Arm)   Pulse 87   Temp 98.8 F (37.1 C)   Resp 18   SpO2 95%   Physical Exam Vitals and nursing note reviewed.  Constitutional:      General: He is not in acute distress.    Appearance: He is well-developed.  HENT:     Head: Normocephalic and atraumatic.     Comments: There is no signs of hematomas or trauma to the head     Mouth/Throat:     Pharynx: No oropharyngeal exudate.  Eyes:     General: No scleral icterus.       Right eye: No discharge.        Left eye: No discharge.     Conjunctiva/sclera: Conjunctivae normal.     Pupils: Pupils are equal, round, and reactive to light.  Neck:     Thyroid: No thyromegaly.     Vascular: No JVD.  Cardiovascular:     Rate and Rhythm: Normal rate and regular rhythm.     Heart sounds: Normal heart sounds. No murmur heard. No friction rub.  No gallop.   Pulmonary:     Effort: Pulmonary effort is normal. No respiratory distress.     Breath sounds: Normal breath sounds. No wheezing or rales.     Comments: Tenderness over the left lateral lower chest wall Chest:     Chest wall: Tenderness present.  Abdominal:     General: Bowel sounds are normal. There is no distension.     Palpations: Abdomen is soft. There is no mass.     Tenderness: There is no abdominal tenderness.  Musculoskeletal:        General: No tenderness. Normal range of motion.     Cervical back: Normal range of motion and neck supple.     Right lower leg: Edema present.     Left lower leg: Edema present.  Lymphadenopathy:     Cervical: No cervical adenopathy.  Skin:    General: Skin is warm and dry.     Findings: No erythema or rash.  Neurological:     Mental Status: He is alert.     Coordination: Coordination normal.     Comments: The patient cannot lift his left arm more than 6 inches off the bed, he has left-sided facial droop, he is able to lift his left leg but has extinction to the left leg.  Speech is clear, he is very hard of hearing.  Memory seems to be okay but the son states that there has been some significant confusion from events of the day earlier in the day.  He does not give any specific examples  Psychiatric:        Behavior: Behavior normal.     ED Results / Procedures / Treatments   Labs (all labs ordered are listed, but only abnormal results are displayed) Labs Reviewed   CBC WITH DIFFERENTIAL/PLATELET - Abnormal; Notable for the following components:      Result Value   WBC 22.7 (*)    RBC 2.97 (*)    Hemoglobin 8.5 (*)    HCT 27.2 (*)    RDW 19.0 (*)    Neutro Abs 18.6 (*)    Monocytes Absolute 2.3 (*)    All other components within normal limits  COMPREHENSIVE METABOLIC PANEL - Abnormal; Notable for the following components:   Sodium 131 (*)    Chloride 96 (*)    Glucose, Bld 132 (*)    Creatinine, Ser 1.39 (*)    Calcium 8.5 (*)    Total Protein 8.5 (*)    Albumin 2.9 (*)    ALT 57 (*)    GFR, Estimated 49 (*)    All other components within normal limits  URINALYSIS, ROUTINE W REFLEX MICROSCOPIC - Abnormal; Notable for the following components:   Hgb urine dipstick SMALL (*)    All other components within normal limits  PROTIME-INR - Abnormal; Notable for the following components:   Prothrombin Time 19.6 (*)    INR 1.7 (*)    All other components within normal limits  APTT - Abnormal; Notable for the following components:   aPTT 40 (*)    All other components within normal limits  I-STAT CHEM 8, ED - Abnormal; Notable for the following components:   Sodium 133 (*)    Chloride 95 (*)    BUN 27 (*)    Glucose, Bld 125 (*)    Calcium, Ion 1.10 (*)    Hemoglobin 7.8 (*)    HCT 23.0 (*)    All other components within normal limits  RESP PANEL BY RT-PCR (FLU A&B, COVID) ARPGX2  LACTIC ACID, PLASMA  ETHANOL  RAPID URINE DRUG SCREEN, HOSP PERFORMED    EKG None  Radiology CT Angio Head W or Wo Contrast  Result Date: 12/12/2020 CLINICAL DATA:  Neuro deficit, acute, stroke suspected. EXAM: CT ANGIOGRAPHY HEAD AND NECK TECHNIQUE: Multidetector CT imaging of the head and neck was performed using the standard protocol during bolus administration of intravenous contrast. Multiplanar CT image reconstructions and MIPs were obtained to evaluate the vascular anatomy. Carotid stenosis measurements (when applicable) are obtained utilizing NASCET  criteria, using the distal internal carotid diameter as the denominator. CONTRAST:  19mL OMNIPAQUE IOHEXOL 350 MG/ML SOLN COMPARISON:  No pertinent prior exams available for comparison. FINDINGS: CTA NECK FINDINGS Aortic arch: Standard aortic branching. Atherosclerotic plaque within the visualized aortic arch and proximal major branch vessels of the neck. No hemodynamically significant innominate or proximal subclavian artery stenosis. Right carotid system: CCA and ICA patent within the neck. Calcified plaque within the CCA, carotid bifurcation and proximal ICA without hemodynamically significant stenosis (50% or greater). Left carotid system: CCA and ICA patent within the neck. Calcified plaque within the CCA, carotid bifurcation and proximal ICA without hemodynamically significant stenosis (50% or greater). Vertebral arteries: Codominant and patent within the neck without stenosis. Skeleton: No acute bony abnormality or aggressive osseous lesion. Other neck: No neck mass or cervical lymphadenopathy. Thyroid unremarkable. Upper chest: Areas of scarring within the imaged lung apices. No consolidation within the imaged lung apices. Review of the MIP images confirms the above findings CTA HEAD FINDINGS Anterior circulation: The intracranial internal carotid arteries are patent. Calcified plaque within both vessels with no more than mild stenosis. The M1 middle cerebral arteries are patent. No M2 proximal branch occlusion or high-grade proximal stenosis is identified. The anterior cerebral arteries are patent. 2 mm vascular protrusion arising from the paraclinoid right ICA which may reflect an aneurysm or infundibulum (series 9, image 94). Posterior circulation: The intracranial vertebral arteries are patent. Minimal nonstenotic calcified plaque within the V4 left vertebral artery. The basilar artery is patent. The posterior cerebral arteries are patent. Posterior communicating arteries are hypoplastic or absent  bilaterally. Venous sinuses: Within the limitations of contrast timing, no convincing thrombus. Anatomic variants: As described Review of the MIP images confirms the above findings These results were called by telephone at the time of interpretation on 12/12/2020 at 8:04 pm to provider Dr. Rory Percy, who verbally acknowledged these results. IMPRESSION: CTA neck: 1. The bilateral common carotid and internal carotid arteries are patent within the neck without hemodynamically significant stenosis (50% or greater). Calcified plaque within both carotid systems within the neck. 2. Vertebral arteries patent within the neck bilaterally without stenosis. 3.  Aortic Atherosclerosis (ICD10-I70.0). CTA head: No intracranial large vessel occlusion or proximal high-grade arterial stenosis. Electronically Signed   By: Kellie Simmering DO   On: 12/12/2020 20:25   CT Angio Neck W and/or Wo Contrast  Result Date: 12/12/2020 CLINICAL DATA:  Neuro deficit, acute, stroke suspected. EXAM: CT ANGIOGRAPHY HEAD AND NECK TECHNIQUE: Multidetector CT imaging of the head and neck was performed using the standard protocol during bolus administration of intravenous contrast. Multiplanar CT image reconstructions and MIPs were obtained to evaluate the vascular anatomy. Carotid stenosis measurements (when applicable) are obtained utilizing NASCET criteria, using the distal internal carotid diameter as the denominator. CONTRAST:  43mL OMNIPAQUE IOHEXOL 350 MG/ML SOLN COMPARISON:  No pertinent prior exams available for comparison. FINDINGS: CTA NECK FINDINGS Aortic arch: Standard aortic  branching. Atherosclerotic plaque within the visualized aortic arch and proximal major branch vessels of the neck. No hemodynamically significant innominate or proximal subclavian artery stenosis. Right carotid system: CCA and ICA patent within the neck. Calcified plaque within the CCA, carotid bifurcation and proximal ICA without hemodynamically significant stenosis (50%  or greater). Left carotid system: CCA and ICA patent within the neck. Calcified plaque within the CCA, carotid bifurcation and proximal ICA without hemodynamically significant stenosis (50% or greater). Vertebral arteries: Codominant and patent within the neck without stenosis. Skeleton: No acute bony abnormality or aggressive osseous lesion. Other neck: No neck mass or cervical lymphadenopathy. Thyroid unremarkable. Upper chest: Areas of scarring within the imaged lung apices. No consolidation within the imaged lung apices. Review of the MIP images confirms the above findings CTA HEAD FINDINGS Anterior circulation: The intracranial internal carotid arteries are patent. Calcified plaque within both vessels with no more than mild stenosis. The M1 middle cerebral arteries are patent. No M2 proximal branch occlusion or high-grade proximal stenosis is identified. The anterior cerebral arteries are patent. 2 mm vascular protrusion arising from the paraclinoid right ICA which may reflect an aneurysm or infundibulum (series 9, image 94). Posterior circulation: The intracranial vertebral arteries are patent. Minimal nonstenotic calcified plaque within the V4 left vertebral artery. The basilar artery is patent. The posterior cerebral arteries are patent. Posterior communicating arteries are hypoplastic or absent bilaterally. Venous sinuses: Within the limitations of contrast timing, no convincing thrombus. Anatomic variants: As described Review of the MIP images confirms the above findings These results were called by telephone at the time of interpretation on 12/12/2020 at 8:04 pm to provider Dr. Rory Percy, who verbally acknowledged these results. IMPRESSION: CTA neck: 1. The bilateral common carotid and internal carotid arteries are patent within the neck without hemodynamically significant stenosis (50% or greater). Calcified plaque within both carotid systems within the neck. 2. Vertebral arteries patent within the neck  bilaterally without stenosis. 3.  Aortic Atherosclerosis (ICD10-I70.0). CTA head: No intracranial large vessel occlusion or proximal high-grade arterial stenosis. Electronically Signed   By: Kellie Simmering DO   On: 12/12/2020 20:25   DG Chest Port 1 View  Result Date: 12/12/2020 CLINICAL DATA:  Cough, weakness EXAM: PORTABLE CHEST 1 VIEW COMPARISON:  12/10/2020 FINDINGS: Left pacer remains in place, unchanged. Cardiomegaly, vascular congestion. Left base opacity again noted, improved since prior study. No confluent opacity on the right. No visible effusions. IMPRESSION: Cardiomegaly, vascular congestion. Left base atelectasis or infiltrate, improving since prior study. Electronically Signed   By: Rolm Baptise M.D.   On: 12/12/2020 23:08   CT HEAD CODE STROKE WO CONTRAST`  Result Date: 12/12/2020 CLINICAL DATA:  Code stroke. Additional history provided: Neuro deficit, acute, stroke suspected. Additional history provided: Last known well on known, left-sided weakness/neglect. EXAM: CT HEAD WITHOUT CONTRAST TECHNIQUE: Contiguous axial images were obtained from the base of the skull through the vertex without intravenous contrast. COMPARISON:  Prior head CT 12/10/2020. FINDINGS: Brain: Streak and beam hardening artifact arising from dental restoration partially obscures the occipital lobes and significantly obscures the posterior fossa. Moderate cerebral atrophy. Comparatively mild cerebellar atrophy. Commensurate prominence of the ventricles and sulci. Mild patchy and ill-defined hypoattenuation within the cerebral white matter is nonspecific, but compatible with chronic small vessel ischemic disease. Redemonstrated chronic lacunar infarct within the right lentiform nucleus. Within the limitations of motion degradation, no acute intracranial hemorrhage or acute demarcated cortical infarct is identified. No extra-axial fluid collection. No evidence of intracranial mass. No midline  shift. Vascular: No appreciable  hyperdense vessel. Atherosclerotic calcifications. Skull: Normal. Negative for fracture or focal lesion. Sinuses/Orbits: Visualized orbits show no acute finding. Mild mucosal thickening within the right sphenoid sinus. ASPECTS ([place] Stroke Program Early CT Score) - Ganglionic level infarction (caudate, lentiform nuclei, internal capsule, insula, M1-M3 cortex): 7 - Supraganglionic infarction (M4-M6 cortex): 3 Total score (0-10 with 10 being normal): 10 These results were called by telephone at the time of interpretation on 12/12/2020 at 8:04 pm to provider [facility] , who verbally acknowledged these results. IMPRESSION: Streak and beam hardening artifact arising from dental restoration partially obscures the occipital lobes and significantly obscures the posterior fossa. Within this limitation, there is no evidence of acute intracranial abnormality. ASPECTS is 10. Redemonstrated chronic right basal ganglia lacunar infarct. Moderate cerebral atrophy with mild cerebral white matter chronic small vessel ischemic disease. Comparatively mild cerebellar atrophy. Electronically Signed   By: Kellie Simmering DO   On: 12/12/2020 20:05    Procedures Procedures   Medications Ordered in ED Medications  iohexol (OMNIPAQUE) 350 MG/ML injection 60 mL (60 mLs Intravenous Contrast Given 12/12/20 1956)    ED Course  I have reviewed the triage vital signs and the nursing notes.  Pertinent labs & imaging results that were available during my care of the patient were reviewed by me and considered in my medical decision making (see chart for details).  Clinical Course as of 12/12/20 2317  Thu Dec 12, 2020  1939 Case discussed with Dr. Malen Gauze with neurology, he recommends activating code stroke, a can be canceled later if needed, patient is at CT scan at this time. [BM]    Clinical Course User Index [BM] Noemi Chapel, MD   MDM Rules/Calculators/A&P                          The patient has an abnormal neurologic  exam, unfortunately we do not know exactly when this started however he did ambulate into the bathroom and then could not come out of the bathroom by himself where he fell.  He now states he cannot use his left arm, he will not tell me when he could last use it correctly.  I would assume that this was this morning however at this point he is 7 hours out from the fall, he is on Eliquis, he is not a candidate for TPA.  Will send over to CT scan immediately, will activate neuro hospitalist for consultation as this patient technically has a large vessel occlusion possible exam with neglect and focal lateralizing weakness.  He does not appear to be hemodynamically unstable.  The patient has appeared stable throughout, there is minimal acute kidney injury, minimal hyponatremia, minimal but chronic anemia, drug screen is negative, urinalysis is clean, CT scan of the head and CT angios scans do not show any signs of acute stroke or large vessel occlusion that would benefit from intervention.  Discussed with neuro hospitalist who recommends the patient be admitted to medical service for the rest of the transient ischemic attack work-up.  As there is an increase in leukocytosis another source of infection was thought, at this point nothing is obvious.  Chest x-ray has been reviewed and there does appear to be some possible infiltrates awaiting read at this time.  We will page for admission.  Discussed with Dr. Marlyce Huge who will admit  Final Clinical Impression(s) / ED Diagnoses Final diagnoses:  TIA (transient ischemic attack)  Leukocytosis, unspecified type  Frequent falls     Noemi Chapel, MD 12/12/20 2333

## 2020-12-12 NOTE — ED Notes (Signed)
Pat to CT

## 2020-12-12 NOTE — Patient Instructions (Signed)
Darbepoetin Alfa injection What is this medicine? DARBEPOETIN ALFA (dar be POE e tin AL fa) helps your body make more red blood cells. It is used to treat anemia caused by chronic kidney failure and chemotherapy. This medicine may be used for other purposes; ask your health care provider or pharmacist if you have questions. COMMON BRAND NAME(S): Aranesp What should I tell my health care provider before I take this medicine? They need to know if you have any of these conditions:  blood clotting disorders or history of blood clots  cancer patient not on chemotherapy  cystic fibrosis  heart disease, such as angina, heart failure, or a history of a heart attack  hemoglobin level of 12 g/dL or greater  high blood pressure  low levels of folate, iron, or vitamin B12  seizures  an unusual or allergic reaction to darbepoetin, erythropoietin, albumin, hamster proteins, latex, other medicines, foods, dyes, or preservatives  pregnant or trying to get pregnant  breast-feeding How should I use this medicine? This medicine is for injection into a vein or under the skin. It is usually given by a health care professional in a hospital or clinic setting. If you get this medicine at home, you will be taught how to prepare and give this medicine. Use exactly as directed. Take your medicine at regular intervals. Do not take your medicine more often than directed. It is important that you put your used needles and syringes in a special sharps container. Do not put them in a trash can. If you do not have a sharps container, call your pharmacist or healthcare provider to get one. A special MedGuide will be given to you by the pharmacist with each prescription and refill. Be sure to read this information carefully each time. Talk to your pediatrician regarding the use of this medicine in children. While this medicine may be used in children as young as 1 month of age for selected conditions, precautions do  apply. Overdosage: If you think you have taken too much of this medicine contact a poison control center or emergency room at once. NOTE: This medicine is only for you. Do not share this medicine with others. What if I miss a dose? If you miss a dose, take it as soon as you can. If it is almost time for your next dose, take only that dose. Do not take double or extra doses. What may interact with this medicine? Do not take this medicine with any of the following medications:  epoetin alfa This list may not describe all possible interactions. Give your health care provider a list of all the medicines, herbs, non-prescription drugs, or dietary supplements you use. Also tell them if you smoke, drink alcohol, or use illegal drugs. Some items may interact with your medicine. What should I watch for while using this medicine? Your condition will be monitored carefully while you are receiving this medicine. You may need blood work done while you are taking this medicine. This medicine may cause a decrease in vitamin B6. You should make sure that you get enough vitamin B6 while you are taking this medicine. Discuss the foods you eat and the vitamins you take with your health care professional. What side effects may I notice from receiving this medicine? Side effects that you should report to your doctor or health care professional as soon as possible:  allergic reactions like skin rash, itching or hives, swelling of the face, lips, or tongue  breathing problems  changes in   vision  chest pain  confusion, trouble speaking or understanding  feeling faint or lightheaded, falls  high blood pressure  muscle aches or pains  pain, swelling, warmth in the leg  rapid weight gain  severe headaches  sudden numbness or weakness of the face, arm or leg  trouble walking, dizziness, loss of balance or coordination  seizures (convulsions)  swelling of the ankles, feet, hands  unusually weak or  tired Side effects that usually do not require medical attention (report to your doctor or health care professional if they continue or are bothersome):  diarrhea  fever, chills (flu-like symptoms)  headaches  nausea, vomiting  redness, stinging, or swelling at site where injected This list may not describe all possible side effects. Call your doctor for medical advice about side effects. You may report side effects to FDA at 1-800-FDA-1088. Where should I keep my medicine? Keep out of the reach of children. Store in a refrigerator between 2 and 8 degrees C (36 and 46 degrees F). Do not freeze. Do not shake. Throw away any unused portion if using a single-dose vial. Throw away any unused medicine after the expiration date. NOTE: This sheet is a summary. It may not cover all possible information. If you have questions about this medicine, talk to your doctor, pharmacist, or health care provider.  2021 Elsevier/Gold Standard (2017-09-22 16:44:20)  

## 2020-12-12 NOTE — ED Triage Notes (Signed)
Pt back from assisted living , the assisted living is requesting that pt up graded to skilled or get pt before he comes back ,

## 2020-12-12 NOTE — ED Notes (Signed)
Returned from CT.

## 2020-12-12 NOTE — Consult Note (Addendum)
Neurology Consultation  Reason for Consult: Code stroke Referring Physician: Dr. Noemi Chapel  CC: Left-sided weakness, left-sided neglect  History is obtained from: Son, chart  HPI: Ricky [name] is a 87 y.o. male past medical history of atrial fibrillation on Eliquis, anemia, myelodysplastic syndrome, pacemaker placement many years ago in [place] with a non-MRI compatible pacemaker, presenting to the emergency room for evaluation of weakness after a fall. It is unclear when his last known normal was-it was presumably couple days ago when he started having falls.  He was discovered fallen down between the bathroom and the wall and was brought in for evaluation.  Head imaging was unremarkable.  Discharged to the facility where he lives with his wife independently.  Had another fall today for which he was brought back for further evaluation.  On initial ED provider evaluation, there was some question of left-sided weakness and neglect and no clear timeline of last known normal for which I recommended that a code stroke be activated since he might be an LVO and an interventional candidate even though not a TPA candidate. The son reports that he has been having a lot of trouble walking and maintaining his balance over the past few weeks to months.  They recently moved from [place] and he has shown a downtrend in terms of his ambulation as well as cognition. There is no clear last known well-he has been having weakness and difficulty with his gait now for many days with no clear sudden onset of focal neurological deficits. On my examination, there was some left-sided weakness but I could not elicit any neglect. Noncontrast head CT unremarkable for bleed CTA head and neck unremarkable for LVO   LKW: Unclear tpa given?: no, unclear last known well Premorbid modified Rankin scale (mRS): At least a 3-uses a walker at baseline and has been having trouble even with walking with a walker over the last few  months.  ROS: Obtained and negative except as noted in HPI  Past Medical History:  Diagnosis Date  . A-fib ([place])   . Anemia   . Anemia of chronic disease 02/02/2020  . Atrial fibrillation ([place])   . Boerhaave's syndrome   . Chronic anticoagulation 01/22/2020  . Colon polyps   . COVID-19   . Hx of colonic polyps 01/22/2020   2017 - [place]  . Hypomagnesemia   . Low grade myelodysplastic syndrome lesions ([place]) 02/02/2020  . Lower urinary tract symptoms due to benign prostatic hyperplasia   . Pacemaker   . Paroxysmal atrial fibrillation ([facility])   . Prostate enlargement   . Symptomatic anemia 12/26/2019  . Vitamin D deficiency     Family History  Problem Relation Age of Onset  . Cirrhosis Mother   . Alcoholism Mother   . Heart attack Father 92  . Heart disease Brother   . Diabetes Brother   . Brain cancer Daughter      Social History:   reports that he quit smoking about 50 years ago. His smoking use included cigarettes. He has never used smokeless tobacco. He reports previous alcohol use. He reports that he does not use drugs.  Medications No current facility-administered medications for this encounter.  Current Outpatient Medications:  .  apixaban (ELIQUIS) 5 MG TABS tablet, Take 1 tablet (5 mg total) by mouth 2 (two) times daily., Disp: 180 tablet, Rfl: 1 .  cholecalciferol (VITAMIN D3) 25 MCG (1000 UNIT) tablet, Take 1,000 Units by mouth daily., Disp: , Rfl:  .  cyanocobalamin  1000 MCG tablet, Take 1,000 mcg by mouth daily., Disp: , Rfl:  .  deferasirox (EXJADE) 500 MG disintegrating tablet, Take 2 tablets (1,000 mg total) by mouth daily before breakfast., Disp: 60 tablet, Rfl: 3 .  Magnesium 250 MG TABS, Take 1 tablet (250 mg total) by mouth daily., Disp: , Rfl: 0 .  simvastatin (ZOCOR) 20 MG tablet, Take 20 mg by mouth daily., Disp: , Rfl:  .  torsemide (DEMADEX) 20 MG tablet, Take 1 tablet (20 mg total) by mouth 2 (two) times daily., Disp: 180 tablet, Rfl: 3 .  traMADol  (ULTRAM) 50 MG tablet, Take 1 tablet (50 mg total) by mouth every 12 (twelve) hours as needed., Disp: 10 tablet, Rfl: 0   Exam: Current vital signs: BP (!) 141/71   Pulse (!) 101   Temp 98.8 F (37.1 C)   Resp 16   SpO2 90%  Vital signs in last 24 hours: Temp:  [98.2 F (36.8 C)-98.8 F (37.1 C)] 98.8 F (37.1 C) (03/24 1402) Pulse Rate:  [87-102] 101 (03/24 2015) Resp:  [16-31] 16 (03/24 2015) BP: (100-141)/(53-71) 141/71 (03/24 2015) SpO2:  [89 %-95 %] 90 % (03/24 2015) General: Awake alert in no distress HEENT: Normocephalic atraumatic, dry oral mucous membranes Lungs are clear to auscultation bilaterally with no wheezing Cardiovascular irregularly irregular, no murmurs Abdomen soft nondistended nontender Extremities are warm well perfused with intact peripheral pulses Neurological exam He is awake alert oriented to self, place and time.  Able to tell me his correct age. Slow to respond to questions.  Poor attention concentration. No evidence of frank aphasia or dysarthria Cranial nerves: Pupils equal round reactive light, extraocular movements intact, visual fields full, facial sensation intact, face symmetric, tongue and palate midline. Motor exam: Left upper extremity has some weakness but it seems more inattentiveness to that side without frank neglect on double simultaneous stimulation.  Right upper extremity antigravity without drift.  Both lower extremities fall to bed before 5 seconds. He has increased tone in both upper extremities worse on the left in comparison to the right with some cogwheeling. Sensory exam: Intact to touch without extinction on double limited stimulation Coordination: Mild action and intention tremor noted on finger-nose-finger testing. Gait testing deferred at this time NIH stroke scale-5  Labs I have reviewed labs in epic and the results pertinent to this consultation are: His white count has nearly doubled in 2 days from 12.8-22.7.  He is  also hyponatremic with creatinine also bumped up from 0.9-1.39.  CBC    Component Value Date/Time   WBC 22.7 (H) 12/12/2020 1800   RBC 2.97 (L) 12/12/2020 1800   HGB 7.8 (L) 12/12/2020 2017   HGB 7.3 (L) 12/05/2020 0820   HCT 23.0 (L) 12/12/2020 2017   PLT 277 12/12/2020 1800   PLT 286 12/05/2020 0820   MCV 91.6 12/12/2020 1800   MCH 28.6 12/12/2020 1800   MCHC 31.3 12/12/2020 1800   RDW 19.0 (H) 12/12/2020 1800   LYMPHSABS 1.8 12/12/2020 1800   MONOABS 2.3 (H) 12/12/2020 1800   EOSABS 0.0 12/12/2020 1800   BASOSABS 0.0 12/12/2020 1800    CMP     Component Value Date/Time   NA 133 (L) 12/12/2020 2017   NA 133 (L) 12/09/2020 1506   K 4.0 12/12/2020 2017   CL 95 (L) 12/12/2020 2017   CO2 26 12/12/2020 1800   GLUCOSE 125 (H) 12/12/2020 2017   BUN 27 (H) 12/12/2020 2017   BUN 12 12/09/2020 1506  CREATININE 1.20 12/12/2020 2017   CREATININE 1.07 09/16/2020 1210   CALCIUM 8.5 (L) 12/12/2020 1800   PROT 8.5 (H) 12/12/2020 1800   ALBUMIN 2.9 (L) 12/12/2020 1800   AST 27 12/12/2020 1800   AST 14 (L) 09/16/2020 1210   ALT 57 (H) 12/12/2020 1800   ALT 23 09/16/2020 1210   ALKPHOS 109 12/12/2020 1800   BILITOT 1.1 12/12/2020 1800   BILITOT 1.1 09/16/2020 1210   GFRNONAA 49 (L) 12/12/2020 1800   GFRNONAA >60 09/16/2020 1210   GFRAA 62 06/03/2020 0835   GFRAA >60 03/28/2020 0919   Imaging I have reviewed the images obtained: CT-scan of the brain no acute changes. CTA head and neck: No intracranial LVO or proximal high-grade arterial stenosis.  Bilateral common carotid and internal carotid patent within the neck without hemodynamically significant stenosis.  There is calcified plaque within both carotid systems within the neck.  Assessment:  87 year old man with above past medical history presenting for second fall in 3 days with left-sided weakness and concern for some left-sided neglect. CT head negative for acute changes CT head and neck with no intracranial large  vessel occlusion. Exam consistent with mild left hemiparesis which according to the family is new-but on exam there is  more of cogwheeling-making me suspicious for an underlying parkinsonian etiology given the history of worsening gait over the past few months. Also has had cognitive decline makes me suspicious for some sort of underlying neurodegenerative process with broad differentials-Alzheimer's versus Lewy body dementia etc. At this point, due to his multiple falls, I think he would be better served with an inpatient admission and some work-up for a stroke/TIA because the ED provider did notice some neglect initially. He has atrial fibrillation and is on anticoagulation, but still could be having TIAs. Has an MRI incompatible pacemaker-cannot get MRI  Impression: Evaluate for stroke TIA Evaluate for underlying neurodegenerative process such as dementia as well as possible parkinsonism Leukocytosis  Recommendations: Admit to hospitalist Telemetry Repeat head CT in 24 hours Hold Eliquis for now-start aspirin 81 2D echo Lipid panel A1c PT OT speech therapy Formal outpatient evaluation for dementia versus movement disorder Urinalysis and chest x-ray given that white count has doubled in the past 2 days-management of any infectious process if found per primary team. Permissive hypertension has no role in the situation because his last known normal is at least 2 days ago.  Blood pressure goal should be normotension. Stroke team will follow with you.  Discussed my plan with Dr. Sabra Heck in the ER.  -- Ricky Portland, MD Neurologist [facility] Pager: [phone]

## 2020-12-12 NOTE — Telephone Encounter (Signed)
Attempt x1 to contact pt regarding pet scan date and time. No answer, LVM with apt date and to return call to the office.

## 2020-12-13 ENCOUNTER — Inpatient Hospital Stay (HOSPITAL_COMMUNITY): Payer: Medicare Other

## 2020-12-13 ENCOUNTER — Encounter (HOSPITAL_COMMUNITY): Payer: Self-pay | Admitting: Internal Medicine

## 2020-12-13 ENCOUNTER — Telehealth: Payer: Self-pay | Admitting: Hematology and Oncology

## 2020-12-13 ENCOUNTER — Observation Stay (HOSPITAL_COMMUNITY): Payer: Medicare Other

## 2020-12-13 DIAGNOSIS — I5032 Chronic diastolic (congestive) heart failure: Secondary | ICD-10-CM | POA: Diagnosis present

## 2020-12-13 DIAGNOSIS — Z96652 Presence of left artificial knee joint: Secondary | ICD-10-CM | POA: Diagnosis not present

## 2020-12-13 DIAGNOSIS — G459 Transient cerebral ischemic attack, unspecified: Secondary | ICD-10-CM

## 2020-12-13 DIAGNOSIS — I2510 Atherosclerotic heart disease of native coronary artery without angina pectoris: Secondary | ICD-10-CM

## 2020-12-13 DIAGNOSIS — Z79899 Other long term (current) drug therapy: Secondary | ICD-10-CM | POA: Diagnosis not present

## 2020-12-13 DIAGNOSIS — R6520 Severe sepsis without septic shock: Secondary | ICD-10-CM | POA: Diagnosis not present

## 2020-12-13 DIAGNOSIS — I5042 Chronic combined systolic (congestive) and diastolic (congestive) heart failure: Secondary | ICD-10-CM | POA: Diagnosis not present

## 2020-12-13 DIAGNOSIS — M109 Gout, unspecified: Secondary | ICD-10-CM | POA: Diagnosis not present

## 2020-12-13 DIAGNOSIS — Z87891 Personal history of nicotine dependence: Secondary | ICD-10-CM | POA: Diagnosis not present

## 2020-12-13 DIAGNOSIS — Z7901 Long term (current) use of anticoagulants: Secondary | ICD-10-CM

## 2020-12-13 DIAGNOSIS — E782 Mixed hyperlipidemia: Secondary | ICD-10-CM | POA: Diagnosis not present

## 2020-12-13 DIAGNOSIS — G9341 Metabolic encephalopathy: Secondary | ICD-10-CM | POA: Diagnosis present

## 2020-12-13 DIAGNOSIS — Z8616 Personal history of COVID-19: Secondary | ICD-10-CM | POA: Diagnosis not present

## 2020-12-13 DIAGNOSIS — R296 Repeated falls: Secondary | ICD-10-CM | POA: Diagnosis not present

## 2020-12-13 DIAGNOSIS — W19XXXA Unspecified fall, initial encounter: Secondary | ICD-10-CM | POA: Diagnosis not present

## 2020-12-13 DIAGNOSIS — Z950 Presence of cardiac pacemaker: Secondary | ICD-10-CM | POA: Diagnosis not present

## 2020-12-13 DIAGNOSIS — Z20822 Contact with and (suspected) exposure to covid-19: Secondary | ICD-10-CM | POA: Diagnosis not present

## 2020-12-13 DIAGNOSIS — D469 Myelodysplastic syndrome, unspecified: Secondary | ICD-10-CM | POA: Diagnosis not present

## 2020-12-13 DIAGNOSIS — D72829 Elevated white blood cell count, unspecified: Secondary | ICD-10-CM | POA: Insufficient documentation

## 2020-12-13 DIAGNOSIS — Y92009 Unspecified place in unspecified non-institutional (private) residence as the place of occurrence of the external cause: Secondary | ICD-10-CM

## 2020-12-13 DIAGNOSIS — A419 Sepsis, unspecified organism: Secondary | ICD-10-CM | POA: Diagnosis not present

## 2020-12-13 DIAGNOSIS — Z8673 Personal history of transient ischemic attack (TIA), and cerebral infarction without residual deficits: Secondary | ICD-10-CM | POA: Diagnosis not present

## 2020-12-13 DIAGNOSIS — Z8249 Family history of ischemic heart disease and other diseases of the circulatory system: Secondary | ICD-10-CM | POA: Diagnosis not present

## 2020-12-13 DIAGNOSIS — J189 Pneumonia, unspecified organism: Secondary | ICD-10-CM | POA: Diagnosis present

## 2020-12-13 DIAGNOSIS — I480 Paroxysmal atrial fibrillation: Secondary | ICD-10-CM

## 2020-12-13 DIAGNOSIS — R918 Other nonspecific abnormal finding of lung field: Secondary | ICD-10-CM | POA: Diagnosis not present

## 2020-12-13 DIAGNOSIS — R29810 Facial weakness: Secondary | ICD-10-CM | POA: Diagnosis not present

## 2020-12-13 DIAGNOSIS — K8020 Calculus of gallbladder without cholecystitis without obstruction: Secondary | ICD-10-CM | POA: Diagnosis not present

## 2020-12-13 DIAGNOSIS — Z96641 Presence of right artificial hip joint: Secondary | ICD-10-CM | POA: Diagnosis not present

## 2020-12-13 LAB — COMPREHENSIVE METABOLIC PANEL
ALT: 47 U/L — ABNORMAL HIGH (ref 0–44)
AST: 26 U/L (ref 15–41)
Albumin: 2.4 g/dL — ABNORMAL LOW (ref 3.5–5.0)
Alkaline Phosphatase: 85 U/L (ref 38–126)
Anion gap: 8 (ref 5–15)
BUN: 23 mg/dL (ref 8–23)
CO2: 25 mmol/L (ref 22–32)
Calcium: 7.9 mg/dL — ABNORMAL LOW (ref 8.9–10.3)
Chloride: 97 mmol/L — ABNORMAL LOW (ref 98–111)
Creatinine, Ser: 1.12 mg/dL (ref 0.61–1.24)
GFR, Estimated: >60 mL/min (ref >60)
Glucose, Bld: 110 mg/dL — ABNORMAL HIGH (ref 70–99)
Potassium: 4.1 mmol/L (ref 3.5–5.1)
Sodium: 130 mmol/L — ABNORMAL LOW (ref 135–145)
Total Bilirubin: 1.5 mg/dL — ABNORMAL HIGH (ref 0.3–1.2)
Total Protein: 7.1 g/dL (ref 6.5–8.1)

## 2020-12-13 LAB — LIPID PANEL
Cholesterol: 115 mg/dL (ref 0–200)
HDL: 31 mg/dL — ABNORMAL LOW (ref >40)
LDL Cholesterol: 68 mg/dL (ref 0–99)
Total CHOL/HDL Ratio: 3.7 RATIO
Triglycerides: 81 mg/dL (ref <150)
VLDL: 16 mg/dL (ref 0–40)

## 2020-12-13 LAB — CBC WITH DIFFERENTIAL/PLATELET
Abs Immature Granulocytes: 0 10*3/uL (ref 0.00–0.07)
Basophils Absolute: 0 10*3/uL (ref 0.0–0.1)
Basophils Relative: 0 %
Eosinophils Absolute: 0 10*3/uL (ref 0.0–0.5)
Eosinophils Relative: 0 %
HCT: 22.7 % — ABNORMAL LOW (ref 39.0–52.0)
Hemoglobin: 7.2 g/dL — ABNORMAL LOW (ref 13.0–17.0)
Lymphocytes Relative: 9 %
Lymphs Abs: 1.5 10*3/uL (ref 0.7–4.0)
MCH: 28.7 pg (ref 26.0–34.0)
MCHC: 31.7 g/dL (ref 30.0–36.0)
MCV: 90.4 fL (ref 80.0–100.0)
Monocytes Absolute: 2 10*3/uL — ABNORMAL HIGH (ref 0.1–1.0)
Monocytes Relative: 12 %
Neutro Abs: 13 10*3/uL — ABNORMAL HIGH (ref 1.7–7.7)
Neutrophils Relative %: 79 %
Platelets: 188 10*3/uL (ref 150–400)
RBC: 2.51 MIL/uL — ABNORMAL LOW (ref 4.22–5.81)
RDW: 18.6 % — ABNORMAL HIGH (ref 11.5–15.5)
WBC: 16.4 10*3/uL — ABNORMAL HIGH (ref 4.0–10.5)
nRBC: 0 /100 WBC
nRBC: 0.1 % (ref 0.0–0.2)

## 2020-12-13 LAB — PROCALCITONIN: Procalcitonin: 0.55 ng/mL

## 2020-12-13 LAB — ECHOCARDIOGRAM COMPLETE BUBBLE STUDY
Area-P 1/2: 3.17 cm2
Calc EF: 50.3 %
S' Lateral: 3.6 cm
Single Plane A2C EF: 50.8 %
Single Plane A4C EF: 51.2 %

## 2020-12-13 LAB — HIV ANTIBODY (ROUTINE TESTING W REFLEX): HIV Screen 4th Generation wRfx: NONREACTIVE

## 2020-12-13 LAB — MAGNESIUM: Magnesium: 2.3 mg/dL (ref 1.7–2.4)

## 2020-12-13 LAB — HEMOGLOBIN A1C
Hgb A1c MFr Bld: 6.7 % — ABNORMAL HIGH (ref 4.8–5.6)
Mean Plasma Glucose: 145.59 mg/dL

## 2020-12-13 LAB — C-REACTIVE PROTEIN: CRP: 14.3 mg/dL — ABNORMAL HIGH (ref <1.0)

## 2020-12-13 MED ORDER — TORSEMIDE 20 MG PO TABS
20.0000 mg | ORAL_TABLET | Freq: Every day | ORAL | Status: DC
  Administered 2020-12-14 – 2020-12-17 (×4): 20 mg via ORAL
  Filled 2020-12-13 (×4): qty 1

## 2020-12-13 MED ORDER — ACETAMINOPHEN 325 MG PO TABS
650.0000 mg | ORAL_TABLET | ORAL | Status: DC | PRN
  Administered 2020-12-14 – 2020-12-16 (×2): 650 mg via ORAL
  Filled 2020-12-13 (×2): qty 2

## 2020-12-13 MED ORDER — SODIUM CHLORIDE 0.9 % IV SOLN
500.0000 mg | Freq: Every day | INTRAVENOUS | Status: AC
  Administered 2020-12-13 – 2020-12-16 (×5): 500 mg via INTRAVENOUS
  Filled 2020-12-13 (×5): qty 500

## 2020-12-13 MED ORDER — TORSEMIDE 20 MG PO TABS: 20.0000 mg | ORAL_TABLET | Freq: Two times a day (BID) | ORAL | Status: DC

## 2020-12-13 MED ORDER — SIMVASTATIN 20 MG PO TABS
20.0000 mg | ORAL_TABLET | Freq: Every day | ORAL | Status: DC
  Administered 2020-12-13 – 2020-12-17 (×5): 20 mg via ORAL
  Filled 2020-12-13 (×5): qty 1

## 2020-12-13 MED ORDER — STROKE: EARLY STAGES OF RECOVERY BOOK
Freq: Once | Status: AC
  Filled 2020-12-13: qty 1

## 2020-12-13 MED ORDER — VITAMIN B-12 1000 MCG PO TABS
1000.0000 ug | ORAL_TABLET | Freq: Every day | ORAL | Status: DC
  Administered 2020-12-13 – 2020-12-17 (×5): 1000 ug via ORAL
  Filled 2020-12-13 (×5): qty 1

## 2020-12-13 MED ORDER — TRAMADOL HCL 50 MG PO TABS
50.0000 mg | ORAL_TABLET | Freq: Two times a day (BID) | ORAL | Status: DC | PRN
  Administered 2020-12-14: 50 mg via ORAL
  Filled 2020-12-13: qty 1

## 2020-12-13 MED ORDER — DEFERASIROX 500 MG PO TBSO: 1000.0000 mg | ORAL_TABLET | Freq: Every day | ORAL | Status: DC

## 2020-12-13 MED ORDER — SODIUM CHLORIDE 0.9 % IV SOLN
2.0000 g | Freq: Every day | INTRAVENOUS | Status: AC
  Administered 2020-12-13 – 2020-12-16 (×5): 2 g via INTRAVENOUS
  Filled 2020-12-13 (×3): qty 2
  Filled 2020-12-13: qty 20
  Filled 2020-12-13: qty 2

## 2020-12-13 MED ORDER — ASPIRIN 81 MG PO CHEW
81.0000 mg | CHEWABLE_TABLET | Freq: Every day | ORAL | Status: DC
  Administered 2020-12-13 – 2020-12-17 (×5): 81 mg via ORAL
  Filled 2020-12-13 (×5): qty 1

## 2020-12-13 NOTE — Progress Notes (Signed)
PT Cancellation Note  Patient Details Name: DERYK BOZMAN MRN: [id number] DOB: 1930/10/27   Cancelled Treatment:    Reason Eval/Treat Not Completed: Patient at procedure or test/unavailable Will follow up as schedule allows.   Lou Miner, DPT  Acute Rehabilitation Services  Pager: [phone] Office: [phone]    Rudean Hitt 12/13/2020, 9:28 AM

## 2020-12-13 NOTE — Progress Notes (Signed)
Received from ED via stretcher; patient is alert; oriented to room and unit routine; bed is low and locked with bed alarm due to high fall risk.

## 2020-12-13 NOTE — Progress Notes (Addendum)
Occupational Therapy Evaluation Patient Details Name: Ricky [name] MRN: [id number] DOB: [birth year] Today's Date: 12/13/2020    History of Present Illness 87 y.o. 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 Report of left arm weakness and a facial droop, Acute metabolic encephalopathy. CT of the head showed no acute findings.   Clinical Impression   PTA pt apparently living at [facility] apt with his wife, Langley Gauss. Pt with multiple falls and is currently total A with ADL tasks and requires Total A +2 with bed mobility and attempts to stand due to deficits listed below. Due to weakness, pt unable to feed himself and will requires assistance with self feeding to improve PO intake. VSS on 2L. Recommend palliative Consult. Will benefit from rehab at SNF to maximize functional level of independence. Will follow acutely.     Follow Up Recommendations  SNF;Supervision/Assistance - 24 hour    Equipment Recommendations       Recommendations for Other Services  Palliative Care Consult     Precautions / Restrictions Precautions Precautions: Fall Precaution Comments: Has had multiple falls at home. Restrictions Weight Bearing Restrictions: No      Mobility Bed Mobility Overal bed mobility: Needs Assistance Bed Mobility: Supine to Sit;Sit to Supine     Supine to sit: Total assist;+2 for physical assistance Sit to supine: Total assist;+2 for physical assistance        Transfers                 General transfer comment: unable to attempt    Balance Overall balance assessment: History of Falls;Needs assistance   Sitting  balance-Leahy Scale: Zero                                     ADL either performed or assessed with clinical judgement   ADL Overall ADL's : Needs assistance/impaired                                       General ADL Comments: overall total A for all ADL     Vision Baseline Vision/History: Wears glasses       Perception     Praxis      Pertinent Vitals/Pain Pain Assessment: Faces Faces Pain Scale: Hurts even more Pain Location: stomach (L shoulder with attmepts at movement) Pain Descriptors / Indicators: Aching;Grimacing;Discomfort Pain Intervention(s): Limited activity within patient's tolerance     Hand Dominance Right   Extremity/Trunk Assessment Upper Extremity Assessment Upper Extremity Assessment: LUE deficits/detail;RUE deficits/detail RUE Deficits / Details: generalized weakness; unable to touch mouth with hand; only @ 30 degrees active shoulder FF; elbow/wrist/hand WFL RUE Coordination: decreased fine motor;decreased gross motor LUE Deficits / Details: unable to move L shoulder - unsure if hurt from fall; pt reports he has had shoulder problems; elbow/wrist/hand WFL LUE Coordination: decreased fine motor;decreased gross motor   Lower Extremity Assessment Lower Extremity Assessment: Defer to PT evaluation   Cervical / Trunk Assessment Cervical / Trunk Assessment: Kyphotic;Other exceptions (posterior pusher)   Communication Communication Communication:  HOH   Cognition Arousal/Alertness: Awake/alert Behavior During Therapy: Flat affect;Anxious Overall Cognitive Status: No family/caregiver present to determine baseline cognitive functioning Area of Impairment: Orientation;Attention;Memory;Following commands;Safety/judgement;Awareness;Problem solving                 Orientation Level: Disoriented to;Place;Time;Situation Current Attention Level: Sustained Memory: Decreased short-term memory Following Commands: Follows  one step commands inconsistently Safety/Judgement: Decreased awareness of deficits;Decreased awareness of safety Awareness: Intellectual Problem Solving: Slow processing;Decreased initiation;Difficulty sequencing;Requires verbal cues;Requires tactile cues General Comments: Disoriented to situation and place.   General Comments       Exercises     Shoulder Instructions      Home Living Family/patient expects to be discharged to:: Private residence Living Arrangements: Spouse/significant other Available Help at Discharge: Family Type of Home: Independent living facility Home Access: Level entry     Home Layout: One level     Bathroom Shower/Tub: Occupational psychologist: Handicapped height     Home Equipment: Environmental consultant - 2 wheels;Shower seat          Prior Functioning/Environment          Comments: Reports using RW at home  Reports doing his own self care - unsure of accuracy        OT Problem List: Decreased strength;Decreased range of motion;Decreased activity tolerance;Impaired balance (sitting and/or standing);Decreased coordination;Decreased cognition;Decreased safety awareness;Decreased knowledge of use of DME or AE;Cardiopulmonary status limiting activity;Impaired UE functional use;Pain;Increased edema      OT Treatment/Interventions: Self-care/ADL training;Therapeutic exercise;Neuromuscular education;Energy conservation;DME and/or AE instruction;Therapeutic activities;Cognitive remediation/compensation;Visual/perceptual remediation/compensation;Patient/family education;Balance training    OT Goals(Current goals can be found in the care plan section) Acute Rehab OT Goals Patient Stated Goal: to rest OT Goal Formulation: Patient unable to participate in goal setting Time For Goal Achievement: 12/27/20 Potential to Achieve Goals: Fair  OT Frequency: Min 2X/week   Barriers to D/C:            Co-evaluation PT/OT/SLP Co-Evaluation/Treatment:  Yes Reason for Co-Treatment: Necessary to address cognition/behavior during functional activity;For patient/therapist safety;To address functional/ADL transfers PT goals addressed during session: Mobility/safety with mobility;Balance OT goals addressed during session: ADL's and self-care      AM-PAC OT "6 Clicks" Daily Activity     Outcome Measure Help from another person eating meals?: Total Help from another person taking care of personal grooming?: Total Help from another person toileting, which includes using toliet, bedpan, or urinal?: Total Help from another person bathing (including washing, rinsing, drying)?: Total Help from another person to put on and taking off regular upper body clothing?: Total Help from another person to put on and taking off regular lower body clothing?: Total 6 Click Score: 6   End of Session Equipment Utilized During Treatment: Oxygen (2L) Nurse Communication: Mobility status  Activity Tolerance: Patient limited by fatigue Patient left: in bed;with call bell/phone within reach  OT Visit Diagnosis: Unsteadiness on feet (R26.81);Other abnormalities of gait and mobility (R26.89);Repeated falls (R29.6);Muscle weakness (generalized) (M62.81);Feeding difficulties (R63.3);Other symptoms and signs involving cognitive function;Pain Pain - Right/Left: Left Pain - part of body: Shoulder (abdomen)                Time: 8676-1950 OT Time Calculation (min): 20 min Charges:  OT General Charges $OT Visit: 1 Visit OT Evaluation $OT Eval Moderate Complexity: [place], OT/L   Acute OT Clinical Specialist [place] [place] Pager [phone] Office [phone]   [facility] 12/13/2020, 1:26 PM

## 2020-12-13 NOTE — Evaluation (Signed)
Physical Therapy Evaluation Patient Details Name: Ricky [name] MRN: [id number] DOB: Feb 23, 1931 Today's Date: 12/13/2020   History of Present Illness  87 y.o. male presented to the ED for recurrent falls on 3/24. He was recently seen in the ED with rib fractures for mechanical fall, incidentally was also found to have a large necrotic appearing lung mass in the left lower lobe concerning for bronchogenic carcinoma. Report of left arm weakness and a facial droop, Acute metabolic encephalopathy. CT of the head showed no acute findings. Past medical history of Miller dysplastic syndrome, paroxysmal atrial fibrillation on Eliquis, systolic heart failure, and Boerhaave syndrome.  Clinical Impression  Pt admitted secondary to problem above with deficits below. Pt very fearful and anxious during mobility. Required total A +2 for bed mobility tasks. Pt currently lives at [facility] with his wife, but has had multiple falls. Feel pt will benefit from SNF level therapies at d/c. Will continue to follow acutely.     Follow Up Recommendations SNF;Supervision/Assistance - 24 hour    Equipment Recommendations  Wheelchair cushion (measurements PT);Wheelchair (measurements PT)    Recommendations for Other Services       Precautions / Restrictions Precautions Precautions: Fall Precaution Comments: Has had multiple falls at home. Restrictions Weight Bearing Restrictions: No      Mobility  Bed Mobility Overal bed mobility: Needs Assistance Bed Mobility: Supine to Sit;Sit to Supine     Supine to sit: Total assist;+2 for physical assistance Sit to supine: Total assist;+2 for physical assistance   General bed mobility comments: total A for LE and trunk assist. Pt unable to assist and very fearful during mobility. Further mobility unsafe to attempt from higher stretcher.    Transfers                 General transfer comment: unable to attempt  Ambulation/Gait                Stairs             Wheelchair Mobility    Modified Rankin (Stroke Patients Only)       Balance Overall balance assessment: History of Falls;Needs assistance Sitting-balance support: Feet supported Sitting balance-Leahy Scale: Poor Sitting balance - Comments: at least min A up to total A for sitting balance.                                     Pertinent Vitals/Pain Pain Assessment: Faces Faces Pain Scale: No hurt Pain Location: stomach (L shoulder with attmepts at movement) Pain Descriptors / Indicators: Aching;Grimacing;Discomfort Pain Intervention(s): Monitored during session;Limited activity within patient's tolerance;Repositioned    Home Living Family/patient expects to be discharged to:: Private residence Living Arrangements: Spouse/significant other Available Help at Discharge: Family Type of Home: Independent living facility Home Access: Level entry     Home Layout: One level Home Equipment: Environmental consultant - 2 wheels;Shower seat      Prior Function           Comments: Reports using RW at home. Unsure if he required assist with ADLs, but assume he likely did given current impairments.     Hand Dominance   Dominant Hand: Right    Extremity/Trunk Assessment   Upper Extremity Assessment Upper Extremity Assessment: Defer to OT evaluation RUE Deficits / Details: generalized weakness; unable to touch mouth with hand; only @ 30 degrees active shoulder FF; elbow/wrist/hand WFL RUE Coordination: decreased fine motor;decreased  gross motor LUE Deficits / Details: unable to move L shoulder - unsure if hurt from fall; pt reports he has had shoulder problems; elbow/wrist/hand WFL LUE Coordination: decreased fine motor;decreased gross motor    Lower Extremity Assessment Lower Extremity Assessment: LLE deficits/detail;RLE deficits/detail RLE Deficits / Details: Pt yelling out with attempts of performing heel slides. When asked what hurt, pt reporting hip on  opposite leg hurt. LLE Deficits / Details: Reporting L hip pain which limited mobility tolerance.    Cervical / Trunk Assessment Cervical / Trunk Assessment: Kyphotic;Other exceptions (posterior pusher)  Communication   Communication: HOH  Cognition Arousal/Alertness: Awake/alert Behavior During Therapy: Flat affect;Anxious Overall Cognitive Status: No family/caregiver present to determine baseline cognitive functioning Area of Impairment: Orientation;Attention;Memory;Following commands;Safety/judgement;Awareness;Problem solving                 Orientation Level: Disoriented to;Place;Time;Situation Current Attention Level: Sustained Memory: Decreased short-term memory Following Commands: Follows one step commands inconsistently Safety/Judgement: Decreased awareness of deficits;Decreased awareness of safety Awareness: Intellectual Problem Solving: Slow processing;Decreased initiation;Difficulty sequencing;Requires verbal cues;Requires tactile cues        General Comments      Exercises     Assessment/Plan    PT Assessment Patient needs continued PT services  PT Problem List Decreased strength;Decreased range of motion;Decreased activity tolerance;Decreased balance;Decreased mobility;Decreased cognition;Decreased knowledge of use of DME;Decreased safety awareness;Decreased knowledge of precautions       PT Treatment Interventions Gait training;DME instruction;Functional mobility training;Therapeutic activities;Therapeutic exercise;Balance training;Patient/family education    PT Goals (Current goals can be found in the Care Plan section)  Acute Rehab PT Goals Patient Stated Goal: to rest PT Goal Formulation: With patient Time For Goal Achievement: 12/27/20 Potential to Achieve Goals: Good    Frequency Min 2X/week   Barriers to discharge        Co-evaluation PT/OT/SLP Co-Evaluation/Treatment: Yes Reason for Co-Treatment: Necessary to address cognition/behavior  during functional activity;To address functional/ADL transfers;For patient/therapist safety PT goals addressed during session: Mobility/safety with mobility;Balance OT goals addressed during session: ADL's and self-care       AM-PAC PT "6 Clicks" Mobility  Outcome Measure Help needed turning from your back to your side while in a flat bed without using bedrails?: Total Help needed moving from lying on your back to sitting on the side of a flat bed without using bedrails?: Total Help needed moving to and from a bed to a chair (including a wheelchair)?: Total Help needed standing up from a chair using your arms (e.g., wheelchair or bedside chair)?: Total Help needed to walk in hospital room?: Total Help needed climbing 3-5 steps with a railing? : Total 6 Click Score: 6    End of Session   Activity Tolerance: Patient limited by pain Patient left: in bed;with call bell/phone within reach (on stretcher in ED) Nurse Communication: Mobility status PT Visit Diagnosis: Unsteadiness on feet (R26.81);Muscle weakness (generalized) (M62.81);History of falling (Z91.81);Repeated falls (R29.6)    Time: 0488-8916 PT Time Calculation (min) (ACUTE ONLY): 21 min   Charges:   PT Evaluation $PT Eval Moderate Complexity: 1 Mod          Reuel Derby, PT, DPT  Acute Rehabilitation Services  Pager: [phone] Office: [phone]   Rudean Hitt 12/13/2020, 4:30 PM

## 2020-12-13 NOTE — Consult Note (Addendum)
NAME:  Ricky [name], MRN:  [id number], DOB:  [birth year], LOS: 0 ADMISSION DATE:  12/12/2020, CONSULTATION DATE:  12/13/2020 REFERRING MD:  Aileen Fass, CHIEF COMPLAINT: Large Nectotic Appearing lung mass central LLL,    History of Present Illness:  87 year old male former smoker ( Quit 1972) with a history of atrial fibrillation on Eliquis, with history of a pacemaker and has had a low-grade myelodysplastic syndrome, malignant melanoma and most recently was seen in the emergency department 3/22  after having an accidental fall in the bathroom where he was lodged between the toilet and the bathtub. There was no head injury, imaging at that time revealed no signs of brain injury or stroke or aneurysm or bleeding, there was discovery of a new left lateral eighth rib fracture and  an incidental finding of a  lung mass , the plan had been for follow up with Dr. Valeta Harms  as OP. The patient returned to the ED 3/24 after another fall. Pt. Fell to his knees, not did not hit his head. There was no head injury. He was admitted to the hospital for progressive weakness and recurrent falls, and left lung base infiltrate. PCCM have been consulted for assistance with work up of  lung mass.   Pertinent  Medical History    Significant Hospital Events: Including procedures, antibiotic start and stop dates in addition to other pertinent events   . ED visit for fall 3/22 . Admission for recurrent fall and progressive weakness  Interim History / Subjective:  Awake and alert, but confused Sats 97% on 2 L [place] WBC 16.4, improving with antibiotic therapy HGB 7.2, platelets 188 HGB has dropped since admission from 8.4 on 3/24 PCT 0.55 CRP 14.3  Objective   Blood pressure (!) 111/53, pulse (!) 101, temperature 98 F (36.7 C), temperature source Oral, resp. rate (!) 25, SpO2 91 %.        Intake/Output Summary (Last 24 hours) at 12/13/2020 1037 Last data filed at 12/13/2020 0504 Gross per 24 hour  Intake 350 ml   Output -  Net 350 ml   There were no vitals filed for this visit.  Examination: General: awake and alert older gentleman , slightly confused,  in NAD HENT: NCAT, No LAD, No JVD, MM pink and moist, HOH Lungs: Bilateral chest excursion, Diminished per bases L>R, few rales per L base. Cardiovascular: S1. S2, RRR, No RMG, ST per tele  Abdomen: Soft, NT, ND, BS +, Extremities: No obvious deformities, brisk cap refill Neuro: Awake and alert, following simple commands, Oriented to self and place, not time, has difficulty remembering some history GU: Ostomy bag to collect urine  Labs/imaging that I havepersonally reviewed   12/10/2020: CT Chest w Contrast Mild left posterior and lateral pleural thickening. Trace right pleural effusion. Low-attenuation, necrotic appearing lung mass within the central left lower lobe is identified. This measures approximately 5.9 x 5.6 by 6.7 cm. Postobstructive pneumonitis is identified within the posterior and lateral left lung base. Enlarged left hilar lymph node is best seen on the coronal image and measures 1.4 cm. Subsegmental atelectasis is noted overlying the posterior right base. Calcified granuloma is noted within the posterior right upper lobe. Bilateral upper lobe bronchial wall thickening and mild bronchiectasis noted. Pleuroparenchymal scarring is seen within the left apex.  Large, necrotic appearing lung mass within the central left lower lobe is identified concerning for primary bronchogenic carcinoma. Signs of postobstructive pneumonitis is noted within the posterior and lateral left lung base. Differential considerations  include necrotizing might 2. Enlarged left hilar lymph node and prominent cluster of left paratracheal lymph nodes worrisome for metastatic adenopathy versus reactive adenopathy. 3. Acute left lateral eighth rib fracture. 4. Multiple subacute to chronic right lateral rib fracture deformities are also identified. 5.  Gallstones. 6. Large fat containing right inguinal hernia. 7. Trace low-attenuation fluid noted within the left posterior pelvis. 8. Aortic atherosclerosis.  CXR 12/12/20 Cardiomegaly, vascular congestion. Left base atelectasis or infiltrate, improving since prior study.  CXR 12/10/2020 Left basilar opacity likely a combination of atelectasis and effusion.   Resolved Hospital Problem list     Assessment & Plan:  Large Nectotic Appearing lung mass central LLL, concerning for bronchogenic carcinoma Enlarged L hilar lymph node Former Administrator, Civil Service, Building control surveyor, former smoker ( Quit [phone]) Plan Will plan for bronchoscopy / biopsy  for tissue typing  Monday 3/26 ( Dr. Lake Bells) Family  Son Ed consented verbally 12/13/2020 PET scan is scheduled for 12/23/2020 for staging Will need to hold Eliquis for 48 hours, stopped Friday 3/25. Follow cytology for results Referral to oncology for follow up care and management as is clinically indicated once cytology resulted  Post Obstructive Pneumonia Leukocytosis Plan  Continue ceftriaxone and azithromycin  Follow blood Cx  Titrate oxygen to maintain saturations > 94%  Aggressive Pulmonary Toilet, IS, Flutter Valve  OOB to chair as tolerated  Consider adding Mucinex as a mucolytic    Anemia No obvious source of bleeding Plan Follow CBC and platelets transfuse for HGB < 7   I have spoken with patient's son Ved 12/13/2020. I discussed need for bronch and biopsy for tissue sampling and diagnosis. We discussed the risks of infection and pneumothorax during the procedure. We discussed the benefit of having diagnosis and potential for genetic and molecular testing to better direct treatment if needed. Patient son is in agreement with plan. He understands the risk benefit ratio. I explained this will be done Monday 3/26, by Dr. Lake Bells.   Best practice (evaluated daily)  Diet:  Per Primary Team Pain/Anxiety/Delirium protocol NA  VAP protocol  (if indicated): NI DVT prophylaxis: PAS hose GI prophylaxis: Per Primary Team Glucose control:  Per Primary Team Central venous access:  NA Arterial line:  NA Foley:  External collection device Mobility:  Per Primary Team PT consulted: Per primary Team Last date of multidisciplinary goals of care discussion 3/25: updated son Ed and discussed plans to work up lung mass Code Status:  Full Disposition: Progressive bed per Primary Team  Labs   CBC: Recent Labs  Lab 12/10/20 1131 12/12/20 1800 12/12/20 2017 12/13/20 0136  WBC 12.8* 22.7*  --  16.4*  NEUTROABS  --  18.6*  --  13.0*  HGB 7.9* 8.5* 7.8* 7.2*  HCT 25.2* 27.2* 23.0* 22.7*  MCV 91.3 91.6  --  90.4  PLT 273 277  --  259    Basic Metabolic Panel: Recent Labs  Lab 12/09/20 1506 12/10/20 1131 12/12/20 1800 12/12/20 2017 12/13/20 0130  NA 133* 136 131* 133* 130*  K 4.2 3.8 4.0 4.0 4.1  CL 98 103 96* 95* 97*  CO2 22 27 26   --  25  GLUCOSE 105* 118* 132* 125* 110*  BUN 12 11 22  27* 23  CREATININE 0.97 0.90 1.39* 1.20 1.12  CALCIUM 7.7* 8.2* 8.5*  --  7.9*  MG  --   --   --   --  2.3   GFR: Estimated Creatinine Clearance: 53.6 mL/min (by C-G formula based on SCr of  1.12 mg/dL). Recent Labs  Lab 12/10/20 1131 12/12/20 1800 12/13/20 0136 12/13/20 0215  PROCALCITON  --   --   --  0.55  WBC 12.8* 22.7* 16.4*  --   LATICACIDVEN  --  1.4  --   --     Liver Function Tests: Recent Labs  Lab 12/12/20 1800 12/13/20 0130  AST 27 26  ALT 57* 47*  ALKPHOS 109 85  BILITOT 1.1 1.5*  PROT 8.5* 7.1  ALBUMIN 2.9* 2.4*   No results for input(s): LIPASE, AMYLASE in the last 168 hours. No results for input(s): AMMONIA in the last 168 hours.  ABG    Component Value Date/Time   TCO2 28 12/12/2020 2017     Coagulation Profile: Recent Labs  Lab 12/12/20 1913  INR 1.7*    Cardiac Enzymes: No results for input(s): CKTOTAL, CKMB, CKMBINDEX, TROPONINI in the last 168 hours.  HbA1C: Hgb A1c MFr Bld   Date/Time Value Ref Range Status  12/13/2020 01:30 AM 6.7 (H) 4.8 - 5.6 % Final    Comment:    (NOTE) Pre diabetes:          5.7%-6.4%  Diabetes:              >6.4%  Glycemic control for   <7.0% adults with diabetes     CBG: No results for input(s): GLUCAP in the last 168 hours.  Review of Systems:   + for some rib pain  Past Medical History:  He,  has a past medical history of A-fib ([place]), Anemia, Anemia of chronic disease (02/02/2020), Atrial fibrillation ([place]), Boerhaave's syndrome, Chronic anticoagulation (01/22/2020), Colon polyps, COVID-19, colonic polyps (01/22/2020), Hypomagnesemia, Low grade myelodysplastic syndrome lesions ([place]) (02/02/2020), Lower urinary tract symptoms due to benign prostatic hyperplasia, Pacemaker, Paroxysmal atrial fibrillation ([place]), Prostate enlargement, Symptomatic anemia (12/26/2019), and Vitamin D deficiency.   Surgical History:   Past Surgical History:  Procedure Laterality Date  . esophogeal tear repair    . PACEMAKER INSERTION  2009, 2017  . THORACOTOMY     esophageal perforation  . TOTAL HIP ARTHROPLASTY Right   . TOTAL KNEE ARTHROPLASTY Left      Social History:   reports that he quit smoking about 50 years ago. His smoking use included cigarettes. He has never used smokeless tobacco. He reports previous alcohol use. He reports that he does not use drugs.   Family History:  His family history includes Alcoholism in his mother; Brain cancer in his daughter; Cirrhosis in his mother; Diabetes in his brother; Heart attack (age of onset: 26) in his father; Heart disease in his brother.   Allergies No Known Allergies   Home Medications  Prior to Admission medications   Medication Sig Start Date End Date Taking? Authorizing Provider  apixaban (ELIQUIS) 5 MG TABS tablet Take 1 tablet (5 mg total) by mouth 2 (two) times daily. 02/29/20  Yes Nahser, Wonda Cheng, MD  cholecalciferol (VITAMIN D3) 25 MCG (1000 UNIT) tablet Take 1,000 Units by mouth  daily.   Yes [provider]  cyanocobalamin 1000 MCG tablet Take 1,000 mcg by mouth daily.   Yes [provider]  Magnesium 250 MG TABS Take 1 tablet (250 mg total) by mouth daily. 12/27/19  Yes Barb Merino, MD  simvastatin (ZOCOR) 20 MG tablet Take 20 mg by mouth daily.   Yes [provider]  torsemide (DEMADEX) 20 MG tablet Take 1 tablet (20 mg total) by mouth 2 (two) times daily. 12/09/20  Yes Nahser, Wonda Cheng,  MD  traMADol (ULTRAM) 50 MG tablet Take 1 tablet (50 mg total) by mouth every 12 (twelve) hours as needed. 12/10/20  Yes Davonna Belling, MD  deferasirox (EXJADE) 500 MG disintegrating tablet Take 2 tablets (1,000 mg total) by mouth daily before breakfast. 07/26/20   Nicholas Lose, MD     Pulmonary APP Time 40 minutes    Magdalen Spatz, MSN, AGACNP-BC [place] for personal pager, and PCCM pager 12/13/2020 12:08 PM    PCCM:  87 yo M, former smoker, afib, on eliquis, low grade MDS. Admitted for fall at home. He was planned to see me this week in clinic.   BP (!) 143/68   Pulse 99   Temp 97.6 F (36.4 C) (Oral)   Resp (!) 34   SpO2 98%   Gen: elderly male, resting in bed HENT: tracking appropriately Heart: RRR, s1 s2  Lungs: diminished in the left base  Abd: Soft nt nd   Labs: reviewed   CT imaging: large necrotic appearing LLL mass  The patient's images have been independently reviewed by me.    A:  LLL necrotic mass, likely a primary lung cancer  Possible post-obstructive pna  Former smoker  AFIB on eliquis  P: Hold eliquis Bronchoscopy planned for Monday NPO Sunday night  Patients family is agreeable to move forward.   [facility] 12/13/2020 1:38 PM

## 2020-12-13 NOTE — Evaluation (Signed)
Speech Language Pathology Evaluation Patient Details Name: Ricky [name] MRN: [id number] DOB: 05/27/1931 Today's Date: 12/13/2020 Time: 1350-1401 SLP Time Calculation (min) (ACUTE ONLY): 11 min  Problem List:  Patient Active Problem List   Diagnosis Date Noted   Acute metabolic encephalopathy 45/80/9983   Fall at home, initial encounter 12/13/2020   Mass of lower lobe of left lung 12/13/2020   Pneumonia of left lower lobe due to infectious organism 12/13/2020   Chronic diastolic CHF (congestive heart failure) ([place]) 12/13/2020   Leukocytosis    Leg edema 12/09/2020   AKI (acute kidney injury) ([place]) 11/22/2020   Thrombocytopenia ([place]) 11/22/2020   Benign neoplasm of trunk 07/05/2020   Malignant melanoma of skin of upper limb, including shoulder ([place]) 07/05/2020   Actinic keratosis 07/05/2020   Basal cell carcinoma of scalp 07/05/2020   Benign neoplasm of skin of face 07/05/2020   BPH with urinary obstruction 07/05/2020   History of malignant melanoma 07/05/2020   Knee joint replacement status, left 07/05/2020   Neoplasm of uncertain behavior of skin 07/05/2020   Sinus node dysfunction ([place]) 03/28/2020   Pacemaker 03/28/2020   Anemia of chronic disease 02/02/2020   Myelodysplastic syndrome ([place]) 02/02/2020   Paroxysmal atrial fibrillation ([place]) 01/22/2020   Chronic anticoagulation 01/22/2020   Hx of colonic polyps 01/22/2020   Symptomatic anemia 12/26/2019   Coronary artery disease involving native coronary artery 08/24/2015   Mixed hyperlipidemia 08/24/2015   Primary osteoarthritis of left knee 08/08/2015   Boerhaave syndrome 01/27/2006   Past Medical History:  Past Medical History:  Diagnosis Date   A-fib ([place])    Anemia    Anemia of chronic disease 02/02/2020   Atrial fibrillation ([facility])    Boerhaave's syndrome    Chronic anticoagulation 01/22/2020   Colon polyps    COVID-19    Hx of colonic polyps 01/22/2020   2017 - [place]    Hypomagnesemia    Low grade myelodysplastic syndrome lesions ([place]) 02/02/2020   Lower urinary tract symptoms due to benign prostatic hyperplasia    Pacemaker    Paroxysmal atrial fibrillation ([place])    Prostate enlargement    Symptomatic anemia 12/26/2019   Vitamin D deficiency    Past Surgical History:  Past Surgical History:  Procedure Laterality Date   esophogeal tear repair     PACEMAKER INSERTION  2009, 2017   THORACOTOMY     esophageal perforation   TOTAL HIP ARTHROPLASTY Right    TOTAL KNEE ARTHROPLASTY Left    HPI:  87 y.o. 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 Report of left arm weakness and a facial droop, Acute metabolic encephalopathy. CT of the head showed no acute findings. Pt initially passed the [place] swallow screen but was made NPO again due to AMS.   Assessment / Plan / Recommendation Clinical Impression  Pt is oriented to person and time (month/year) but not place or situation despite cues. His recall is reduced, as is his sustained attention and storage of new information. He seems to comprehend simple instructions and questions when he can hear them well, and there is no overt impairment of his speech or language. He also initially demonstrated appropriate safety awareness, acknowledging that he is  weak and shouldn't get up alone, but as soon as he was gently challenged with more structured cognitive tasks, pt quickly became restless and insistent on getting OOB to go home. Although it is unclear how this compares to his baseline given reports of cognitive decline PTA, anticipate that he will at least need 24/7 assistance  upon discharge.    SLP Assessment  SLP Recommendation/Assessment: Patient needs continued Speech Lanaguage Pathology Services SLP Visit Diagnosis: Cognitive communication deficit (R41.841)    Follow Up Recommendations  Skilled Nursing facility;24 hour supervision/assistance    Frequency and Duration min 2x/week  2 weeks      SLP Evaluation Cognition  Overall Cognitive Status: No family/caregiver present to determine baseline cognitive functioning Arousal/Alertness: Awake/alert Orientation Level: Oriented to person;Oriented to time;Disoriented to place;Disoriented to situation Attention: Sustained Sustained Attention: Impaired Sustained Attention Impairment: Verbal basic Memory: Impaired Memory Impairment: Storage deficit;Decreased recall of new information Awareness: Impaired Awareness Impairment: Intellectual impairment Safety/Judgment: Impaired       Comprehension  Auditory Comprehension Overall Auditory Comprehension: Appears within functional limits for tasks assessed    Expression Expression Primary Mode of Expression: Verbal Verbal Expression Overall Verbal Expression: Appears within functional limits for tasks assessed Written Expression Dominant Hand: Right   Oral / Motor  Oral Motor/Sensory Function Overall Oral Motor/Sensory Function: Within functional limits Motor Speech Overall Motor Speech: Appears within functional limits for tasks assessed   GO                    Osie Bond., M.A. [facility] Rehabilitation Services Pager [phone] Office [phone]  12/13/2020, 3:45 PM

## 2020-12-13 NOTE — ED Notes (Signed)
Patient complaining of "hurting all over" at this time. Attempted to reposition in bed to take pressure off of bottom, patient stated that it felt better but did not take all pain away

## 2020-12-13 NOTE — ED Notes (Signed)
Patient's son, Clancey, updated via telephone

## 2020-12-13 NOTE — Progress Notes (Signed)
  Echocardiogram 2D Echocardiogram has been performed.  Ricky [name] 12/13/2020, 9:25 AM

## 2020-12-13 NOTE — Progress Notes (Signed)
OT Cancellation Note  Patient Details Name: BRON SNELLINGS MRN: [id number] DOB: [birth year]   Cancelled Treatment:    Reason Eval/Treat Not Completed: Patient at procedure or test/ unavailable  [facility] 12/13/2020, 9:21 AM  Maurie Boettcher, OT/L   Acute OT Clinical Specialist Acute Rehabilitation Services Pager [phone] Office [phone]

## 2020-12-13 NOTE — Progress Notes (Addendum)
STROKE TEAM PROGRESS NOTE   SUBJECTIVE (INTERVAL HISTORY) No family is at the bedside.  Overall his condition is unchanged.  He is hollering for help to get out of bed, he thought he was locked up in bed by SCDs. He is orientated to self, his wife and his home phone number, but not place, time or age. He is weak b/l LEs but symmetrical strength. His left UE proximal weakness but bicep and finger grip are symmetrical to the right. He denies left shoulder pain.  I talked with his son over the phone. He had two falls recently and fell on the left side with left rib fracture. Son thinks he may have landed on his shoulder too and he has high pain tolerance so he may still has some left shoulder injury but not for certain. will do left shoulder X-ray.     OBJECTIVE Temp:  [97.6 F (36.4 C)-98 F (36.7 C)] 97.6 F (36.4 C) (03/25 1212) Pulse Rate:  [88-101] 94 (03/25 1651) Cardiac Rhythm: Sinus tachycardia (03/25 1309) Resp:  [16-39] 18 (03/25 1651) BP: (102-143)/(52-90) 127/67 (03/25 1651) SpO2:  [89 %-98 %] 97 % (03/25 1651)  No results for input(s): GLUCAP in the last 168 hours. Recent Labs  Lab 12/09/20 1506 12/10/20 1131 12/12/20 1800 12/12/20 2017 12/13/20 0130  NA 133* 136 131* 133* 130*  K 4.2 3.8 4.0 4.0 4.1  CL 98 103 96* 95* 97*  CO2 22 27 26   --  25  GLUCOSE 105* 118* 132* 125* 110*  BUN 12 11 22  27* 23  CREATININE 0.97 0.90 1.39* 1.20 1.12  CALCIUM 7.7* 8.2* 8.5*  --  7.9*  MG  --   --   --   --  2.3   Recent Labs  Lab 12/12/20 1800 12/13/20 0130  AST 27 26  ALT 57* 47*  ALKPHOS 109 85  BILITOT 1.1 1.5*  PROT 8.5* 7.1  ALBUMIN 2.9* 2.4*   Recent Labs  Lab 12/10/20 1131 12/12/20 1800 12/12/20 2017 12/13/20 0136  WBC 12.8* 22.7*  --  16.4*  NEUTROABS  --  18.6*  --  13.0*  HGB 7.9* 8.5* 7.8* 7.2*  HCT 25.2* 27.2* 23.0* 22.7*  MCV 91.3 91.6  --  90.4  PLT 273 277  --  188   No results for input(s): CKTOTAL, CKMB, CKMBINDEX, TROPONINI in the last 168  hours. Recent Labs    12/12/20 1913  LABPROT 19.6*  INR 1.7*   Recent Labs    12/12/20 2222  COLORURINE YELLOW  LABSPEC 1.029  PHURINE 5.0  GLUCOSEU NEGATIVE  HGBUR SMALL*  BILIRUBINUR NEGATIVE  KETONESUR NEGATIVE  PROTEINUR NEGATIVE  NITRITE NEGATIVE  LEUKOCYTESUR NEGATIVE       Component Value Date/Time   CHOL 115 12/13/2020 0130   CHOL 139 02/28/2020 1418   TRIG 81 12/13/2020 0130   HDL 31 (L) 12/13/2020 0130   HDL 53 02/28/2020 1418   CHOLHDL 3.7 12/13/2020 0130   VLDL 16 12/13/2020 0130   LDLCALC 68 12/13/2020 0130   LDLCALC 73 02/28/2020 1418   Lab Results  Component Value Date   HGBA1C 6.7 (H) 12/13/2020      Component Value Date/Time   LABOPIA NONE DETECTED 12/12/2020 2222   COCAINSCRNUR NONE DETECTED 12/12/2020 2222   LABBENZ NONE DETECTED 12/12/2020 2222   AMPHETMU NONE DETECTED 12/12/2020 2222   THCU NONE DETECTED 12/12/2020 2222   LABBARB NONE DETECTED 12/12/2020 2222    Recent Labs  Lab 12/12/20 1913  ETH <  10    I have personally reviewed the radiological images below and agree with the radiology interpretations.  CT Angio Head W or Wo Contrast  Addendum Date: 12/13/2020   ADDENDUM REPORT: 12/13/2020 07:41 ADDENDUM: Finding omitted from the impression of the report: 2 mm vascular protrusion arising from the paraclinoid right ICA, which may reflect an aneurysm or infundibulum. These results will be called to the ordering clinician or representative by the Radiologist Assistant, and communication documented in the PACS or [employer]. Electronically Signed   By: Kellie Simmering DO   On: 12/13/2020 07:41   Result Date: 12/13/2020 CLINICAL DATA:  Neuro deficit, acute, stroke suspected. EXAM: CT ANGIOGRAPHY HEAD AND NECK TECHNIQUE: Multidetector CT imaging of the head and neck was performed using the standard protocol during bolus administration of intravenous contrast. Multiplanar CT image reconstructions and MIPs were obtained to evaluate the  vascular anatomy. Carotid stenosis measurements (when applicable) are obtained utilizing NASCET criteria, using the distal internal carotid diameter as the denominator. CONTRAST:  71mL OMNIPAQUE IOHEXOL 350 MG/ML SOLN COMPARISON:  No pertinent prior exams available for comparison. FINDINGS: CTA NECK FINDINGS Aortic arch: Standard aortic branching. Atherosclerotic plaque within the visualized aortic arch and proximal major branch vessels of the neck. No hemodynamically significant innominate or proximal subclavian artery stenosis. Right carotid system: CCA and ICA patent within the neck. Calcified plaque within the CCA, carotid bifurcation and proximal ICA without hemodynamically significant stenosis (50% or greater). Left carotid system: CCA and ICA patent within the neck. Calcified plaque within the CCA, carotid bifurcation and proximal ICA without hemodynamically significant stenosis (50% or greater). Vertebral arteries: Codominant and patent within the neck without stenosis. Skeleton: No acute bony abnormality or aggressive osseous lesion. Other neck: No neck mass or cervical lymphadenopathy. Thyroid unremarkable. Upper chest: Areas of scarring within the imaged lung apices. No consolidation within the imaged lung apices. Review of the MIP images confirms the above findings CTA HEAD FINDINGS Anterior circulation: The intracranial internal carotid arteries are patent. Calcified plaque within both vessels with no more than mild stenosis. The M1 middle cerebral arteries are patent. No M2 proximal branch occlusion or high-grade proximal stenosis is identified. The anterior cerebral arteries are patent. 2 mm vascular protrusion arising from the paraclinoid right ICA which may reflect an aneurysm or infundibulum (series 9, image 94). Posterior circulation: The intracranial vertebral arteries are patent. Minimal nonstenotic calcified plaque within the V4 left vertebral artery. The basilar artery is patent. The posterior  cerebral arteries are patent. Posterior communicating arteries are hypoplastic or absent bilaterally. Venous sinuses: Within the limitations of contrast timing, no convincing thrombus. Anatomic variants: As described Review of the MIP images confirms the above findings These results were called by telephone at the time of interpretation on 12/12/2020 at 8:04 pm to provider Dr. Rory Percy, who verbally acknowledged these results. IMPRESSION: CTA neck: 1. The bilateral common carotid and internal carotid arteries are patent within the neck without hemodynamically significant stenosis (50% or greater). Calcified plaque within both carotid systems within the neck. 2. Vertebral arteries patent within the neck bilaterally without stenosis. 3.  Aortic Atherosclerosis (ICD10-I70.0). CTA head: No intracranial large vessel occlusion or proximal high-grade arterial stenosis. Electronically Signed: By: Kellie Simmering DO On: 12/12/2020 20:25   CT Head Wo Contrast  Result Date: 12/10/2020 CLINICAL DATA:  Fall from toilet.  Head trauma. EXAM: CT HEAD WITHOUT CONTRAST TECHNIQUE: Contiguous axial images were obtained from the base of the skull through the vertex without intravenous contrast. COMPARISON:  None. FINDINGS: Brain: No evidence of acute infarction, hemorrhage, hydrocephalus, extra-axial collection or mass lesion/mass effect. Mild low-density changes within the periventricular and subcortical white matter compatible with chronic microvascular ischemic change. Mild diffuse cerebral volume loss. Vascular: Atherosclerotic calcifications involving the large vessels of the skull base. No unexpected hyperdense vessel. Skull: Normal. Negative for fracture or focal lesion. Sinuses/Orbits: No acute finding. Other: Negative for scalp hematoma. IMPRESSION: 1. No acute intracranial findings. 2. Mild chronic microvascular ischemic change and cerebral volume loss. Electronically Signed   By: Davina Poke D.O.   On: 12/10/2020 14:49    CT Angio Neck W and/or Wo Contrast  Addendum Date: 12/13/2020   ADDENDUM REPORT: 12/13/2020 07:41 ADDENDUM: Finding omitted from the impression of the report: 2 mm vascular protrusion arising from the paraclinoid right ICA, which may reflect an aneurysm or infundibulum. These results will be called to the ordering clinician or representative by the Radiologist Assistant, and communication documented in the PACS or [employer]. Electronically Signed   By: Kellie Simmering DO   On: 12/13/2020 07:41   Result Date: 12/13/2020 CLINICAL DATA:  Neuro deficit, acute, stroke suspected. EXAM: CT ANGIOGRAPHY HEAD AND NECK TECHNIQUE: Multidetector CT imaging of the head and neck was performed using the standard protocol during bolus administration of intravenous contrast. Multiplanar CT image reconstructions and MIPs were obtained to evaluate the vascular anatomy. Carotid stenosis measurements (when applicable) are obtained utilizing NASCET criteria, using the distal internal carotid diameter as the denominator. CONTRAST:  25mL OMNIPAQUE IOHEXOL 350 MG/ML SOLN COMPARISON:  No pertinent prior exams available for comparison. FINDINGS: CTA NECK FINDINGS Aortic arch: Standard aortic branching. Atherosclerotic plaque within the visualized aortic arch and proximal major branch vessels of the neck. No hemodynamically significant innominate or proximal subclavian artery stenosis. Right carotid system: CCA and ICA patent within the neck. Calcified plaque within the CCA, carotid bifurcation and proximal ICA without hemodynamically significant stenosis (50% or greater). Left carotid system: CCA and ICA patent within the neck. Calcified plaque within the CCA, carotid bifurcation and proximal ICA without hemodynamically significant stenosis (50% or greater). Vertebral arteries: Codominant and patent within the neck without stenosis. Skeleton: No acute bony abnormality or aggressive osseous lesion. Other neck: No neck mass or  cervical lymphadenopathy. Thyroid unremarkable. Upper chest: Areas of scarring within the imaged lung apices. No consolidation within the imaged lung apices. Review of the MIP images confirms the above findings CTA HEAD FINDINGS Anterior circulation: The intracranial internal carotid arteries are patent. Calcified plaque within both vessels with no more than mild stenosis. The M1 middle cerebral arteries are patent. No M2 proximal branch occlusion or high-grade proximal stenosis is identified. The anterior cerebral arteries are patent. 2 mm vascular protrusion arising from the paraclinoid right ICA which may reflect an aneurysm or infundibulum (series 9, image 94). Posterior circulation: The intracranial vertebral arteries are patent. Minimal nonstenotic calcified plaque within the V4 left vertebral artery. The basilar artery is patent. The posterior cerebral arteries are patent. Posterior communicating arteries are hypoplastic or absent bilaterally. Venous sinuses: Within the limitations of contrast timing, no convincing thrombus. Anatomic variants: As described Review of the MIP images confirms the above findings These results were called by telephone at the time of interpretation on 12/12/2020 at 8:04 pm to provider Dr. Rory Percy, who verbally acknowledged these results. IMPRESSION: CTA neck: 1. The bilateral common carotid and internal carotid arteries are patent within the neck without hemodynamically significant stenosis (50% or greater). Calcified plaque within both carotid systems within  the neck. 2. Vertebral arteries patent within the neck bilaterally without stenosis. 3.  Aortic Atherosclerosis (ICD10-I70.0). CTA head: No intracranial large vessel occlusion or proximal high-grade arterial stenosis. Electronically Signed: By: Kellie Simmering DO On: 12/12/2020 20:25   CT Chest W Contrast  Result Date: 12/10/2020 CLINICAL DATA:  Head trauma.  Fell off toilet. EXAM: CT CHEST, ABDOMEN, AND PELVIS WITH CONTRAST  TECHNIQUE: Multidetector CT imaging of the chest, abdomen and pelvis was performed following the standard protocol during bolus administration of intravenous contrast. CONTRAST:  160mL OMNIPAQUE IOHEXOL 300 MG/ML  SOLN COMPARISON:  None FINDINGS: CT CHEST FINDINGS Cardiovascular: Heart size appears normal. There is a left chest wall pacer device with leads in the right atrial appendage and right ventricle. Aortic atherosclerosis. Coronary artery calcifications. Mediastinum/Nodes: Normal appearance of the thyroid gland. The trachea appears patent and is midline. Normal appearance of the esophagus. No enlarged axillary, supraclavicular, mediastinal or hilar lymph nodes. There is a cluster of prominent left paratracheal lymph nodes (none of which meet CT criteria for adenopathy) which measure up to 9 mm, image 26/3. Lungs/Pleura: Mild left posterior and lateral pleural thickening. Trace right pleural effusion. Low-attenuation, necrotic appearing lung mass within the central left lower lobe is identified. This measures approximately 5.9 x 5.6 by 6.7 cm. Postobstructive pneumonitis is identified within the posterior and lateral left lung base. Enlarged left hilar lymph node is best seen on the coronal image and measures 1.4 cm. Subsegmental atelectasis is noted overlying the posterior right base. Calcified granuloma is noted within the posterior right upper lobe. Bilateral upper lobe bronchial wall thickening and mild bronchiectasis noted. Pleuroparenchymal scarring is seen within the left apex. Musculoskeletal: Acute left lateral eighth rib fracture identified. Multiple subacute to chronic right lateral rib fracture deformities are also identified, image 110/4 and image 124/4. CT ABDOMEN PELVIS FINDINGS Hepatobiliary: No hepatic injury or perihepatic hematoma. Two calcified stones noted within the gallbladder measuring up to 1.4 cm. No signs of gallbladder wall thickening or pericholecystic fluid. Mild increase caliber  of the CBD measures up to 1 cm. No signs of choledocholithiasis or obstructing mass. Pancreas: Unremarkable. No pancreatic ductal dilatation or surrounding inflammatory changes. Spleen: No splenic injury or perisplenic hematoma. Adrenals/Urinary Tract: No adrenal hemorrhage or renal injury identified. Bladder is unremarkable. Stomach/Bowel: Stomach is within normal limits. Appendix appears normal. No evidence of bowel wall thickening, distention, or inflammatory changes. Multiple colonic diverticula identified without acute inflammation. Vascular/Lymphatic: Aortic atherosclerosis. No aneurysm. No abdominopelvic adenopathy identified. Reproductive: Prostate gland appears enlarged. Other: Trace low-attenuation fluid noted within the left posterior pelvis. Large fat containing right inguinal hernia. Musculoskeletal: Previous right hip arthroplasty. The scoliosis and mild degenerative disc disease identified within the lumbar spine. IMPRESSION: 1. Large, necrotic appearing lung mass within the central left lower lobe is identified concerning for primary bronchogenic carcinoma. Signs of postobstructive pneumonitis is noted within the posterior and lateral left lung base. Differential considerations include necrotizing might 2. Enlarged left hilar lymph node and prominent cluster of left paratracheal lymph nodes worrisome for metastatic adenopathy versus reactive adenopathy. 3. Acute left lateral eighth rib fracture. 4. Multiple subacute to chronic right lateral rib fracture deformities are also identified. 5. Gallstones. 6. Large fat containing right inguinal hernia. 7. Trace low-attenuation fluid noted within the left posterior pelvis. 8. Aortic atherosclerosis. Aortic Atherosclerosis (ICD10-I70.0). These results were called by telephone at the time of interpretation on 12/10/2020 at 3:06 pm to provider [facility] , who verbally acknowledged these results. Electronically Signed   By:  Kerby Moors M.D.   On:  12/10/2020 15:06   CT ABDOMEN PELVIS W CONTRAST  Result Date: 12/10/2020 CLINICAL DATA:  Head trauma.  Fell off toilet. EXAM: CT CHEST, ABDOMEN, AND PELVIS WITH CONTRAST TECHNIQUE: Multidetector CT imaging of the chest, abdomen and pelvis was performed following the standard protocol during bolus administration of intravenous contrast. CONTRAST:  179mL OMNIPAQUE IOHEXOL 300 MG/ML  SOLN COMPARISON:  None FINDINGS: CT CHEST FINDINGS Cardiovascular: Heart size appears normal. There is a left chest wall pacer device with leads in the right atrial appendage and right ventricle. Aortic atherosclerosis. Coronary artery calcifications. Mediastinum/Nodes: Normal appearance of the thyroid gland. The trachea appears patent and is midline. Normal appearance of the esophagus. No enlarged axillary, supraclavicular, mediastinal or hilar lymph nodes. There is a cluster of prominent left paratracheal lymph nodes (none of which meet CT criteria for adenopathy) which measure up to 9 mm, image 26/3. Lungs/Pleura: Mild left posterior and lateral pleural thickening. Trace right pleural effusion. Low-attenuation, necrotic appearing lung mass within the central left lower lobe is identified. This measures approximately 5.9 x 5.6 by 6.7 cm. Postobstructive pneumonitis is identified within the posterior and lateral left lung base. Enlarged left hilar lymph node is best seen on the coronal image and measures 1.4 cm. Subsegmental atelectasis is noted overlying the posterior right base. Calcified granuloma is noted within the posterior right upper lobe. Bilateral upper lobe bronchial wall thickening and mild bronchiectasis noted. Pleuroparenchymal scarring is seen within the left apex. Musculoskeletal: Acute left lateral eighth rib fracture identified. Multiple subacute to chronic right lateral rib fracture deformities are also identified, image 110/4 and image 124/4. CT ABDOMEN PELVIS FINDINGS Hepatobiliary: No hepatic injury or  perihepatic hematoma. Two calcified stones noted within the gallbladder measuring up to 1.4 cm. No signs of gallbladder wall thickening or pericholecystic fluid. Mild increase caliber of the CBD measures up to 1 cm. No signs of choledocholithiasis or obstructing mass. Pancreas: Unremarkable. No pancreatic ductal dilatation or surrounding inflammatory changes. Spleen: No splenic injury or perisplenic hematoma. Adrenals/Urinary Tract: No adrenal hemorrhage or renal injury identified. Bladder is unremarkable. Stomach/Bowel: Stomach is within normal limits. Appendix appears normal. No evidence of bowel wall thickening, distention, or inflammatory changes. Multiple colonic diverticula identified without acute inflammation. Vascular/Lymphatic: Aortic atherosclerosis. No aneurysm. No abdominopelvic adenopathy identified. Reproductive: Prostate gland appears enlarged. Other: Trace low-attenuation fluid noted within the left posterior pelvis. Large fat containing right inguinal hernia. Musculoskeletal: Previous right hip arthroplasty. The scoliosis and mild degenerative disc disease identified within the lumbar spine. IMPRESSION: 1. Large, necrotic appearing lung mass within the central left lower lobe is identified concerning for primary bronchogenic carcinoma. Signs of postobstructive pneumonitis is noted within the posterior and lateral left lung base. Differential considerations include necrotizing might 2. Enlarged left hilar lymph node and prominent cluster of left paratracheal lymph nodes worrisome for metastatic adenopathy versus reactive adenopathy. 3. Acute left lateral eighth rib fracture. 4. Multiple subacute to chronic right lateral rib fracture deformities are also identified. 5. Gallstones. 6. Large fat containing right inguinal hernia. 7. Trace low-attenuation fluid noted within the left posterior pelvis. 8. Aortic atherosclerosis. Aortic Atherosclerosis (ICD10-I70.0). These results were called by telephone  at the time of interpretation on 12/10/2020 at 3:06 pm to provider [facility] , who verbally acknowledged these results. Electronically Signed   By: Kerby Moors M.D.   On: 12/10/2020 15:06   DG Chest Port 1 View  Result Date: 12/12/2020 CLINICAL DATA:  Cough, weakness EXAM: PORTABLE CHEST  1 VIEW COMPARISON:  12/10/2020 FINDINGS: Left pacer remains in place, unchanged. Cardiomegaly, vascular congestion. Left base opacity again noted, improved since prior study. No confluent opacity on the right. No visible effusions. IMPRESSION: Cardiomegaly, vascular congestion. Left base atelectasis or infiltrate, improving since prior study. Electronically Signed   By: Rolm Baptise M.D.   On: 12/12/2020 23:08   DG Chest Portable 1 View  Result Date: 12/10/2020 CLINICAL DATA:  [place] today. EXAM: PORTABLE CHEST 1 VIEW COMPARISON:  Chest x-ray 09/16/2020 FINDINGS: The heart is borderline enlarged. Stable tortuosity and calcification of the thoracic aorta. The pacer wires are in good position, unchanged. Left basilar opacity could be a combination of atelectasis, infiltrate and effusion. The right lung is grossly clear. No pneumothorax identified. The bony thorax is intact.  No obvious acute fractures. IMPRESSION: Left basilar opacity likely a combination of atelectasis and effusion. Grossly intact bony thorax. Electronically Signed   By: Marijo Sanes M.D.   On: 12/10/2020 11:56   ECHOCARDIOGRAM COMPLETE BUBBLE STUDY  Result Date: 12/13/2020    ECHOCARDIOGRAM REPORT   Patient Name:   Ricky [name] Date of Exam: 12/13/2020 Medical Rec #:  [id number]          Height:       71.0 in Accession #:    [id number]         Weight:       200.0 lb Date of Birth:  08/16/1931          BSA:          2.108 m Patient Age:    89 years           BP:           131/70 mmHg Patient Gender: M                  HR:           94 bpm. Exam Location:  Inpatient Procedure: 2D Echo, Cardiac Doppler, Color Doppler and Intracardiac             Opacification Agent Indications:    TIA (transient ischemic attack) 435.9 / G45.9  History:        Patient has prior history of Echocardiogram examinations, most                 recent 05/29/2020. Arrythmias:Atrial Fibrillation; Risk                 Factors:Former Smoker.  Sonographer:    Vickie Epley RDCS Referring Phys: [id number] [place]  1. Left ventricular ejection fraction, by estimation, is 60 to 65%. The left ventricle has normal function. The left ventricle has no regional wall motion abnormalities. Indeterminate diastolic filling due to E-A fusion.  2. Right ventricular systolic function is normal. The right ventricular size is normal.  3. The mitral valve is normal in structure. No evidence of mitral valve regurgitation. No evidence of mitral stenosis.  4. The aortic valve is normal in structure. There is mild calcification of the aortic valve. Aortic valve regurgitation is not visualized. Mild to moderate aortic valve sclerosis/calcification is present, without any evidence of aortic stenosis.  5. Aortic dilatation noted. There is borderline dilatation of the aortic root, measuring 38 mm.  6. The inferior vena cava is normal in size with greater than 50% respiratory variability, suggesting right atrial pressure of 3 mmHg.  7. Agitated saline contrast bubble study was negative, with no evidence of any interatrial shunt.  Comparison(s): No significant change from prior study. Prior images reviewed side by side. FINDINGS  Left Ventricle: Left ventricular ejection fraction, by estimation, is 60 to 65%. The left ventricle has normal function. The left ventricle has no regional wall motion abnormalities. Definity contrast agent was given IV to delineate the left ventricular  endocardial borders. The left ventricular internal cavity size was normal in size. There is no left ventricular hypertrophy. Indeterminate diastolic filling due to E-A fusion. Right Ventricle: The right ventricular size is  normal. No increase in right ventricular wall thickness. Right ventricular systolic function is normal. Left Atrium: Left atrial size was normal in size. Right Atrium: Right atrial size was normal in size. Pericardium: There is no evidence of pericardial effusion. Mitral Valve: The mitral valve is normal in structure. There is mild thickening of the mitral valve leaflet(s). Mild mitral annular calcification. No evidence of mitral valve regurgitation. No evidence of mitral valve stenosis. Tricuspid Valve: The tricuspid valve is normal in structure. Tricuspid valve regurgitation is not demonstrated. No evidence of tricuspid stenosis. Aortic Valve: The aortic valve is normal in structure. There is mild calcification of the aortic valve. Aortic valve regurgitation is not visualized. Mild to moderate aortic valve sclerosis/calcification is present, without any evidence of aortic stenosis. Pulmonic Valve: The pulmonic valve was normal in structure. Pulmonic valve regurgitation is not visualized. No evidence of pulmonic stenosis. Aorta: Aortic dilatation noted. There is borderline dilatation of the aortic root, measuring 38 mm. Venous: The inferior vena cava is normal in size with greater than 50% respiratory variability, suggesting right atrial pressure of 3 mmHg. IAS/Shunts: No atrial level shunt detected by color flow Doppler. Agitated saline contrast was given intravenously to evaluate for intracardiac shunting. Agitated saline contrast bubble study was negative, with no evidence of any interatrial shunt.  LEFT VENTRICLE PLAX 2D LVIDd:         5.00 cm      Diastology LVIDs:         3.60 cm      LV e' medial:    5.92 cm/s LV PW:         0.90 cm      LV E/e' medial:  13.6 LV IVS:        0.90 cm      LV e' lateral:   6.97 cm/s LVOT diam:     2.40 cm      LV E/e' lateral: 11.5 LV SV:         80 LV SV Index:   38 LVOT Area:     4.52 cm  LV Volumes (MOD) LV vol d, MOD A2C: 121.0 ml LV vol d, MOD A4C: 148.0 ml LV vol s, MOD  A2C: 59.5 ml LV vol s, MOD A4C: 72.2 ml LV SV MOD A2C:     61.5 ml LV SV MOD A4C:     148.0 ml LV SV MOD BP:      67.5 ml RIGHT VENTRICLE RV S prime:     12.70 cm/s TAPSE (M-mode): 2.2 cm LEFT ATRIUM           Index       RIGHT ATRIUM           Index LA diam:      3.60 cm 1.71 cm/m  RA Area:     13.90 cm LA Vol (A2C): 31.6 ml 14.99 ml/m RA Volume:   27.20 ml  12.90 ml/m LA Vol (A4C): 40.8 ml 19.35 ml/m  AORTIC VALVE  LVOT Vmax:   77.60 cm/s LVOT Vmean:  57.600 cm/s LVOT VTI:    0.176 m  AORTA Ao Root diam: 3.80 cm Ao Asc diam:  3.80 cm MITRAL VALVE MV Area (PHT): 3.17 cm    SHUNTS MV Decel Time: 239 msec    Systemic VTI:  0.18 m MV E velocity: 80.50 cm/s  Systemic Diam: 2.40 cm MV A velocity: 59.20 cm/s MV E/A ratio:  1.36 Mihai Croitoru MD Electronically signed by Sanda Klein MD Signature Date/Time: 12/13/2020/12:44:09 PM    Final    CT HEAD CODE STROKE WO CONTRAST`  Result Date: 12/12/2020 CLINICAL DATA:  Code stroke. Additional history provided: Neuro deficit, acute, stroke suspected. Additional history provided: Last known well on known, left-sided weakness/neglect. EXAM: CT HEAD WITHOUT CONTRAST TECHNIQUE: Contiguous axial images were obtained from the base of the skull through the vertex without intravenous contrast. COMPARISON:  Prior head CT 12/10/2020. FINDINGS: Brain: Streak and beam hardening artifact arising from dental restoration partially obscures the occipital lobes and significantly obscures the posterior fossa. Moderate cerebral atrophy. Comparatively mild cerebellar atrophy. Commensurate prominence of the ventricles and sulci. Mild patchy and ill-defined hypoattenuation within the cerebral white matter is nonspecific, but compatible with chronic small vessel ischemic disease. Redemonstrated chronic lacunar infarct within the right lentiform nucleus. Within the limitations of motion degradation, no acute intracranial hemorrhage or acute demarcated cortical infarct is identified. No  extra-axial fluid collection. No evidence of intracranial mass. No midline shift. Vascular: No appreciable hyperdense vessel. Atherosclerotic calcifications. Skull: Normal. Negative for fracture or focal lesion. Sinuses/Orbits: Visualized orbits show no acute finding. Mild mucosal thickening within the right sphenoid sinus. ASPECTS ([place] Stroke Program Early CT Score) - Ganglionic level infarction (caudate, lentiform nuclei, internal capsule, insula, M1-M3 cortex): 7 - Supraganglionic infarction (M4-M6 cortex): 3 Total score (0-10 with 10 being normal): 10 These results were called by telephone at the time of interpretation on 12/12/2020 at 8:04 pm to provider [facility] , who verbally acknowledged these results. IMPRESSION: Streak and beam hardening artifact arising from dental restoration partially obscures the occipital lobes and significantly obscures the posterior fossa. Within this limitation, there is no evidence of acute intracranial abnormality. ASPECTS is 10. Redemonstrated chronic right basal ganglia lacunar infarct. Moderate cerebral atrophy with mild cerebral white matter chronic small vessel ischemic disease. Comparatively mild cerebellar atrophy. Electronically Signed   By: Kellie Simmering DO   On: 12/12/2020 20:05    PHYSICAL EXAM  Temp:  [97.6 F (36.4 C)-98 F (36.7 C)] 97.6 F (36.4 C) (03/25 1212) Pulse Rate:  [88-101] 94 (03/25 1651) Resp:  [16-39] 18 (03/25 1651) BP: (102-143)/(52-90) 127/67 (03/25 1651) SpO2:  [89 %-98 %] 97 % (03/25 1651)  General - Well nourished, well developed, mildly agitated and trying to get out of bed.  Ophthalmologic - fundi not visualized due to noncooperation.  Cardiovascular - irregularly irregular heart rate and rhythm.  Neuro - awake, alert, eyes open, mildly agitated trying to get out of bed, orientated to self, his wife and home phone number but not orientated to age, place, or time. No aphasia, fluent language, following some simple  commands but not all.  Not cooperative with naming or repetition. No gaze palsy, tracking bilaterally, blinking to visual threat bilaterally, PERRL. No facial droop. Tongue midline.  Bilateral LEs 3/5 proximal and distal, symmetrical strength. Left UE proximal deltoid 2/5 but bicep and finger grip 4/5, RUE 4/5 proximal and distal.  Sensation and coordination not corporative, gait not tested.  ASSESSMENT/PLAN Ricky [name] is a 87 y.o. male with history of atrial fibrillation on Eliquis, anemia, myelodysplastic syndrome, pacemaker placement many years ago not compatible with MRI admitted for evaluation of recurrent falls due to weakness. No tPA given due to outside window.    ? Stroke vs. Generalized weakness with left shoulder weakness due to injury  Resultant left shoulder weakness - ? Should injury with fall  MRI not able to perform due to pacemaker  CT head no acute abnormality  CTA head and neck unremarkable  CT repeat pending  Left shoulder x-ray pending  2D Echo EF 60 to 65%  LDL 68  HgbA1c 6.7  SCDs for VTE prophylaxis  Eliquis (apixaban) daily prior to admission, now on ASA 81mg . Eliquis on hold now for bronchoscopy Monday. If repeat CT no large stroke okay to resume Eliquis after bronchoscopy  Patient counseled to be compliant with his antithrombotic medications  Ongoing aggressive stroke risk factor management  Therapy recommendations:  SNF  Disposition:  pending  Recent falls  Recurrent falls in 2 days  Causing left rib fracture and ?  Left shoulder injury  Etiology not quite clear, concerning for gait disorder in the setting of cognitive impairment, recommend outpatient neurology follow-up  Lung cancer   Found to have LLL necrotic mass, likely a primary lung cancer   Pulmonary consulted  Plan to have bronchoscopy on Monday  Hold off eliquis for now  Persistent A. Fib  On Eliquis PTA  Now on hold pending bronchoscopy Monday   If  repeat CT no large stroke okay to resume Eliquis after bronchoscopy   Hyperlipidemia  Home meds: Zocor 20  LDL 68, goal < 70  Now on Zocor 20  Continue statin at discharge  Other Stroke Risk Factors  Advanced age  Other Active Problems  Pacemaker, not compatible with MRI  myelodysplastic syndrome  Cognitive impairment, recommend outpatient neurology follow-up  Hospital day # 0   Rosalin Hawking, MD PhD Stroke Neurology 12/13/2020 5:44 PM    To contact Stroke Continuity provider, please refer to [email or web address] After hours, contact General Neurology

## 2020-12-13 NOTE — H&P (Incomplete)
History and Physical    Ricky [name] NWG:[id number] DOB: [birth year] DOA: 12/12/2020  PCP: Roetta Sessions, NP  Patient coming from: Home   Chief Complaint: Fall   HPI:    87 year old male with past medical history of myelodysplastic syndrome, paroxysmal atrial fibrillation (on Eliquis, S/P Pacemaker), coronary artery disease,  systolic congestive heart failure (Echo 05/2020 EF 60-65%), Boerhaave syndrome who presents to [facility] emergency department with his son due to a recurrent fall.  Of note, patient had recently presented to our emergency department on 3/22 after suffering a fall at home.  At that time, patient was diagnosed with left lateral eighth rib fracture.  Incidentally, patient was also found to have a large necrotic appearing lung mass in the left lower lobe concerning for bronchogenic carcinoma.  This was communicated to the patient's oncologist Dr. Lindi Adie on that day who promptly arrange for the patient to follow-up as an outpatient with pulmonology for evaluation and possible bronchoscopy.  On patient's presentation today he is lethargic and confused and is a poor historian secondary to this.  Majority the history is been obtained from discussions with the emergency department staff as well as review of emergency department notes.  I have attempted to contact the son via phone number with no answer.  Unfortunately, earlier today on 3/24 the patient has suffered another fall at home.  This time, the patient reports a fall in the bathroom.  Patient states that he "slipped."  Patient denies loss of consciousness.  There was no witnessed tonic-clonic/seizure-like activity.  After the fall, patient was unable to get up prompting the patient spouse to going to the bathroom and helped him up.  Upon further questioning patient does endorse a recent history of cough but is unable to provide further detail.  Patient additionally denies fevers, chest pain or  shortness of breath.  Upon presentation to [facility] emergency department, there was initial concern for left arm weakness and left facial droop on initial exam concerning for possible stroke or TIA.  Case was between the emergency department provider and Dr. Rory Percy with neurology.  Recommendation was for patient to be hospitalized for usual TIA/stroke work-up.  Unfortunately MRI brain cannot be performed due to remote history of pacemaker placement.  The hospitalist group was then called to assess the patient for admission to the hospital.  Review of Systems:   Review of Systems  Unable to perform ROS: Mental status change    Past Medical History:  Diagnosis Date  . A-fib ([place])   . Anemia   . Anemia of chronic disease 02/02/2020  . Atrial fibrillation ([place])   . Boerhaave's syndrome   . Chronic anticoagulation 01/22/2020  . Colon polyps   . COVID-19   . Hx of colonic polyps 01/22/2020   2017 - [place]  . Hypomagnesemia   . Low grade myelodysplastic syndrome lesions ([place]) 02/02/2020  . Lower urinary tract symptoms due to benign prostatic hyperplasia   . Pacemaker   . Paroxysmal atrial fibrillation (HCC)   . Prostate enlargement   . Symptomatic anemia 12/26/2019  . Vitamin D deficiency     Past Surgical History:  Procedure Laterality Date  . esophogeal tear repair    . PACEMAKER INSERTION  2009, 2017  . THORACOTOMY     esophageal perforation  . TOTAL HIP ARTHROPLASTY Right   . TOTAL KNEE ARTHROPLASTY Left      reports that he quit smoking about 50 years ago. His smoking use included  cigarettes. He has never used smokeless tobacco. He reports previous alcohol use. He reports that he does not use drugs.  No Known Allergies  Family History  Problem Relation Age of Onset  . Cirrhosis Mother   . Alcoholism Mother   . Heart attack Father 45  . Heart disease Brother   . Diabetes Brother   . Brain cancer Daughter      Prior to Admission medications   Medication Sig Start  Date End Date Taking? Authorizing Provider  apixaban (ELIQUIS) 5 MG TABS tablet Take 1 tablet (5 mg total) by mouth 2 (two) times daily. 02/29/20  Yes Nahser, Wonda Cheng, MD  cholecalciferol (VITAMIN D3) 25 MCG (1000 UNIT) tablet Take 1,000 Units by mouth daily.   Yes [provider]  cyanocobalamin 1000 MCG tablet Take 1,000 mcg by mouth daily.   Yes [provider]  Magnesium 250 MG TABS Take 1 tablet (250 mg total) by mouth daily. 12/27/19  Yes Barb Merino, MD  simvastatin (ZOCOR) 20 MG tablet Take 20 mg by mouth daily.   Yes [provider]  torsemide (DEMADEX) 20 MG tablet Take 1 tablet (20 mg total) by mouth 2 (two) times daily. 12/09/20  Yes Nahser, Wonda Cheng, MD  traMADol (ULTRAM) 50 MG tablet Take 1 tablet (50 mg total) by mouth every 12 (twelve) hours as needed. 12/10/20  Yes Davonna Belling, MD  deferasirox (EXJADE) 500 MG disintegrating tablet Take 2 tablets (1,000 mg total) by mouth daily before breakfast. 07/26/20   Nicholas Lose, MD    Physical Exam: Vitals:   12/12/20 2200 12/12/20 2230 12/12/20 2300 12/12/20 2330  BP: 116/64 125/64 122/62 124/68  Pulse: 96 88 88 90  Resp: (!) 25 (!) 21 (!) 21 (!) 24  Temp:      SpO2: 94% 93% 90% 95%    Constitutional: Lethargic but arousable and oriented x2, no associated distress.   Skin: no rashes, no lesions, notable poor skin turgor. Eyes: Pupils are equally reactive to light.  No evidence of scleral icterus or conjunctival pallor.  ENMT: Dry mucous membranes noted.  Posterior pharynx clear of any exudate or lesions.   Neck: normal, supple, no masses, no thyromegaly.  No evidence of jugular venous distension.   Respiratory: Notable bibasilar rales with particularly coarse breath sounds in the left base with associated rales.  No evidence of wheezing.  Patient is tachypneic with increased respiratory effort but no evidence of accessory muscle use.   Cardiovascular: Regular rate and rhythm, no murmurs / rubs /  gallops. No extremity edema. 2+ pedal pulses. No carotid bruits.  Chest:   Nontender without crepitus or deformity.   Back:   Nontender without crepitus or deformity. Abdomen: Abdomen is soft and nontender.  No evidence of intra-abdominal masses.  Positive bowel sounds noted in all quadrants.   Musculoskeletal: No joint deformity upper and lower extremities. Good ROM, no contractures. Normal muscle tone.  Neurologic: CN 2-12 grossly intact. Sensation intact.  Patient moving all 4 extremities spontaneously.  Patient is following all commands.  Patient is responsive to verbal stimuli.   Psychiatric: Patient exhibits angry mood with flat affect.  Patient currently does not seem to possess insight as to his current situation.     Labs on Admission: I have personally reviewed following labs and imaging studies -   CBC: Recent Labs  Lab 12/10/20 1131 12/12/20 1800 12/12/20 2017  WBC 12.8* 22.7*  --   NEUTROABS  --  18.6*  --  HGB 7.9* 8.5* 7.8*  HCT 25.2* 27.2* 23.0*  MCV 91.3 91.6  --   PLT 273 277  --    Basic Metabolic Panel: Recent Labs  Lab 12/09/20 1506 12/10/20 1131 12/12/20 1800 12/12/20 2017  NA 133* 136 131* 133*  K 4.2 3.8 4.0 4.0  CL 98 103 96* 95*  CO2 22 27 26   --   GLUCOSE 105* 118* 132* 125*  BUN 12 11 22  27*  CREATININE 0.97 0.90 1.39* 1.20  CALCIUM 7.7* 8.2* 8.5*  --    GFR: Estimated Creatinine Clearance: 50 mL/min (by C-G formula based on SCr of 1.2 mg/dL). Liver Function Tests: Recent Labs  Lab 12/12/20 1800  AST 27  ALT 57*  ALKPHOS 109  BILITOT 1.1  PROT 8.5*  ALBUMIN 2.9*   No results for input(s): LIPASE, AMYLASE in the last 168 hours. No results for input(s): AMMONIA in the last 168 hours. Coagulation Profile: Recent Labs  Lab 12/12/20 1913  INR 1.7*   Cardiac Enzymes: No results for input(s): CKTOTAL, CKMB, CKMBINDEX, TROPONINI in the last 168 hours. BNP (last 3 results) No results for input(s): PROBNP in the last 8760  hours. HbA1C: No results for input(s): HGBA1C in the last 72 hours. CBG: No results for input(s): GLUCAP in the last 168 hours. Lipid Profile: No results for input(s): CHOL, HDL, LDLCALC, TRIG, CHOLHDL, LDLDIRECT in the last 72 hours. Thyroid Function Tests: No results for input(s): TSH, T4TOTAL, FREET4, T3FREE, THYROIDAB in the last 72 hours. Anemia Panel: No results for input(s): VITAMINB12, FOLATE, FERRITIN, TIBC, IRON, RETICCTPCT in the last 72 hours. Urine analysis:    Component Value Date/Time   COLORURINE YELLOW 12/12/2020 2222   APPEARANCEUR CLEAR 12/12/2020 2222   LABSPEC 1.029 12/12/2020 2222   PHURINE 5.0 12/12/2020 2222   GLUCOSEU NEGATIVE 12/12/2020 2222   HGBUR SMALL (A) 12/12/2020 2222   BILIRUBINUR NEGATIVE 12/12/2020 2222   KETONESUR NEGATIVE 12/12/2020 2222   PROTEINUR NEGATIVE 12/12/2020 2222   NITRITE NEGATIVE 12/12/2020 2222   LEUKOCYTESUR NEGATIVE 12/12/2020 2222    Radiological Exams on Admission - Personally Reviewed: CT Angio Head W or Wo Contrast  Result Date: 12/12/2020 CLINICAL DATA:  Neuro deficit, acute, stroke suspected. EXAM: CT ANGIOGRAPHY HEAD AND NECK TECHNIQUE: Multidetector CT imaging of the head and neck was performed using the standard protocol during bolus administration of intravenous contrast. Multiplanar CT image reconstructions and MIPs were obtained to evaluate the vascular anatomy. Carotid stenosis measurements (when applicable) are obtained utilizing NASCET criteria, using the distal internal carotid diameter as the denominator. CONTRAST:  57mL OMNIPAQUE IOHEXOL 350 MG/ML SOLN COMPARISON:  No pertinent prior exams available for comparison. FINDINGS: CTA NECK FINDINGS Aortic arch: Standard aortic branching. Atherosclerotic plaque within the visualized aortic arch and proximal major branch vessels of the neck. No hemodynamically significant innominate or proximal subclavian artery stenosis. Right carotid system: CCA and ICA patent within the  neck. Calcified plaque within the CCA, carotid bifurcation and proximal ICA without hemodynamically significant stenosis (50% or greater). Left carotid system: CCA and ICA patent within the neck. Calcified plaque within the CCA, carotid bifurcation and proximal ICA without hemodynamically significant stenosis (50% or greater). Vertebral arteries: Codominant and patent within the neck without stenosis. Skeleton: No acute bony abnormality or aggressive osseous lesion. Other neck: No neck mass or cervical lymphadenopathy. Thyroid unremarkable. Upper chest: Areas of scarring within the imaged lung apices. No consolidation within the imaged lung apices. Review of the MIP images confirms the above findings CTA HEAD  FINDINGS Anterior circulation: The intracranial internal carotid arteries are patent. Calcified plaque within both vessels with no more than mild stenosis. The M1 middle cerebral arteries are patent. No M2 proximal branch occlusion or high-grade proximal stenosis is identified. The anterior cerebral arteries are patent. 2 mm vascular protrusion arising from the paraclinoid right ICA which may reflect an aneurysm or infundibulum (series 9, image 94). Posterior circulation: The intracranial vertebral arteries are patent. Minimal nonstenotic calcified plaque within the V4 left vertebral artery. The basilar artery is patent. The posterior cerebral arteries are patent. Posterior communicating arteries are hypoplastic or absent bilaterally. Venous sinuses: Within the limitations of contrast timing, no convincing thrombus. Anatomic variants: As described Review of the MIP images confirms the above findings These results were called by telephone at the time of interpretation on 12/12/2020 at 8:04 pm to provider Dr. Rory Percy, who verbally acknowledged these results. IMPRESSION: CTA neck: 1. The bilateral common carotid and internal carotid arteries are patent within the neck without hemodynamically significant stenosis (50%  or greater). Calcified plaque within both carotid systems within the neck. 2. Vertebral arteries patent within the neck bilaterally without stenosis. 3.  Aortic Atherosclerosis (ICD10-I70.0). CTA head: No intracranial large vessel occlusion or proximal high-grade arterial stenosis. Electronically Signed   By: Kellie Simmering DO   On: 12/12/2020 20:25   CT Angio Neck W and/or Wo Contrast  Result Date: 12/12/2020 CLINICAL DATA:  Neuro deficit, acute, stroke suspected. EXAM: CT ANGIOGRAPHY HEAD AND NECK TECHNIQUE: Multidetector CT imaging of the head and neck was performed using the standard protocol during bolus administration of intravenous contrast. Multiplanar CT image reconstructions and MIPs were obtained to evaluate the vascular anatomy. Carotid stenosis measurements (when applicable) are obtained utilizing NASCET criteria, using the distal internal carotid diameter as the denominator. CONTRAST:  14mL OMNIPAQUE IOHEXOL 350 MG/ML SOLN COMPARISON:  No pertinent prior exams available for comparison. FINDINGS: CTA NECK FINDINGS Aortic arch: Standard aortic branching. Atherosclerotic plaque within the visualized aortic arch and proximal major branch vessels of the neck. No hemodynamically significant innominate or proximal subclavian artery stenosis. Right carotid system: CCA and ICA patent within the neck. Calcified plaque within the CCA, carotid bifurcation and proximal ICA without hemodynamically significant stenosis (50% or greater). Left carotid system: CCA and ICA patent within the neck. Calcified plaque within the CCA, carotid bifurcation and proximal ICA without hemodynamically significant stenosis (50% or greater). Vertebral arteries: Codominant and patent within the neck without stenosis. Skeleton: No acute bony abnormality or aggressive osseous lesion. Other neck: No neck mass or cervical lymphadenopathy. Thyroid unremarkable. Upper chest: Areas of scarring within the imaged lung apices. No consolidation  within the imaged lung apices. Review of the MIP images confirms the above findings CTA HEAD FINDINGS Anterior circulation: The intracranial internal carotid arteries are patent. Calcified plaque within both vessels with no more than mild stenosis. The M1 middle cerebral arteries are patent. No M2 proximal branch occlusion or high-grade proximal stenosis is identified. The anterior cerebral arteries are patent. 2 mm vascular protrusion arising from the paraclinoid right ICA which may reflect an aneurysm or infundibulum (series 9, image 94). Posterior circulation: The intracranial vertebral arteries are patent. Minimal nonstenotic calcified plaque within the V4 left vertebral artery. The basilar artery is patent. The posterior cerebral arteries are patent. Posterior communicating arteries are hypoplastic or absent bilaterally. Venous sinuses: Within the limitations of contrast timing, no convincing thrombus. Anatomic variants: As described Review of the MIP images confirms the above findings These results were called  by telephone at the time of interpretation on 12/12/2020 at 8:04 pm to provider Dr. Rory Percy, who verbally acknowledged these results. IMPRESSION: CTA neck: 1. The bilateral common carotid and internal carotid arteries are patent within the neck without hemodynamically significant stenosis (50% or greater). Calcified plaque within both carotid systems within the neck. 2. Vertebral arteries patent within the neck bilaterally without stenosis. 3.  Aortic Atherosclerosis (ICD10-I70.0). CTA head: No intracranial large vessel occlusion or proximal high-grade arterial stenosis. Electronically Signed   By: Kellie Simmering DO   On: 12/12/2020 20:25   DG Chest Port 1 View  Result Date: 12/12/2020 CLINICAL DATA:  Cough, weakness EXAM: PORTABLE CHEST 1 VIEW COMPARISON:  12/10/2020 FINDINGS: Left pacer remains in place, unchanged. Cardiomegaly, vascular congestion. Left base opacity again noted, improved since prior  study. No confluent opacity on the right. No visible effusions. IMPRESSION: Cardiomegaly, vascular congestion. Left base atelectasis or infiltrate, improving since prior study. Electronically Signed   By: Rolm Baptise M.D.   On: 12/12/2020 23:08   CT HEAD CODE STROKE WO CONTRAST`  Result Date: 12/12/2020 CLINICAL DATA:  Code stroke. Additional history provided: Neuro deficit, acute, stroke suspected. Additional history provided: Last known well on known, left-sided weakness/neglect. EXAM: CT HEAD WITHOUT CONTRAST TECHNIQUE: Contiguous axial images were obtained from the base of the skull through the vertex without intravenous contrast. COMPARISON:  Prior head CT 12/10/2020. FINDINGS: Brain: Streak and beam hardening artifact arising from dental restoration partially obscures the occipital lobes and significantly obscures the posterior fossa. Moderate cerebral atrophy. Comparatively mild cerebellar atrophy. Commensurate prominence of the ventricles and sulci. Mild patchy and ill-defined hypoattenuation within the cerebral white matter is nonspecific, but compatible with chronic small vessel ischemic disease. Redemonstrated chronic lacunar infarct within the right lentiform nucleus. Within the limitations of motion degradation, no acute intracranial hemorrhage or acute demarcated cortical infarct is identified. No extra-axial fluid collection. No evidence of intracranial mass. No midline shift. Vascular: No appreciable hyperdense vessel. Atherosclerotic calcifications. Skull: Normal. Negative for fracture or focal lesion. Sinuses/Orbits: Visualized orbits show no acute finding. Mild mucosal thickening within the right sphenoid sinus. ASPECTS ([place] Stroke Program Early CT Score) - Ganglionic level infarction (caudate, lentiform nuclei, internal capsule, insula, M1-M3 cortex): 7 - Supraganglionic infarction (M4-M6 cortex): 3 Total score (0-10 with 10 being normal): 10 These results were called by telephone at the  time of interpretation on 12/12/2020 at 8:04 pm to provider [facility] , who verbally acknowledged these results. IMPRESSION: Streak and beam hardening artifact arising from dental restoration partially obscures the occipital lobes and significantly obscures the posterior fossa. Within this limitation, there is no evidence of acute intracranial abnormality. ASPECTS is 10. Redemonstrated chronic right basal ganglia lacunar infarct. Moderate cerebral atrophy with mild cerebral white matter chronic small vessel ischemic disease. Comparatively mild cerebellar atrophy. Electronically Signed   By: Kellie Simmering DO   On: 12/12/2020 20:05    EKG: Personally reviewed.  Rhythm is normal sinus rhythm with heart rate of 94 bpm.  No dynamic ST segment changes appreciated.  Assessment/Plan Principal Problem:   Fall at home, initial encounter  Patient is presenting with sequela of yet another fall at home.  Mild patient reports that this fall was mechanical but he is unfortunately an extremely poor historian at this time.  Emergency department staff reports left upper extremity weakness and facial droop upon initial arrival to the emergency department bring about concern for possible TIA.  Patient is now not exhibiting any neurologic deficit.  Neurology was appropriately consulted by the ER provider due to this concern and we will proceed with working up patient  including serial neurologic checks, monitoring patient on telemetry, echocardiogram in the morning, holding Eliquis therapy and transitioning patient to baby aspirin daily as well as PT, OT and SLP evaluations.  Neurology is recommending repeat CT imaging in 24 hours.  That being said, I am more concerned about the likelihood of a developing left lower lobe postobstructive pneumonia.  Patient is actively coughing throughout my entire interview and exhibits coarse breath sounds in the left lower fields.  Furthermore, patient exhibits a substantial  leukocytosis and tachypnea.  For this we are initiating intravenous antibiotic therapy and supplemental oxygen.  Blood cultures are being obtained.  Active Problems:   Acute metabolic encephalopathy   Notable lethargy and confusion throughout the interview with patient being an extremely poor historian.  Most likely etiology is underlying infectious process  Treating underlying pneumonia with intravenous antibiotic therapy, monitoring for spontaneous resolution of encephalopathy.  Temporarily holding usual home regimen of diuretics and hydrating patient gently this evening.  If encephalopathy does not spontaneously resolve will expand work-up.    Mass of lower lobe of left lung   Incidental identification of necrotic left lower lung mass to measuring 5.9 x 5.6 x 6.7 cm.  Evidence of postobstructive inflammatory process identified.  With patient's progressively worsening leukocytosis I am concerned that this is likely a progressive postobstructive pneumonia.  Additional evidence of enlarged left hilar lymph node and prominent cluster of left peritracheal lymph nodes concerning for metastatic adenopathy.  Patient to undergo outpatient pulmonology evaluation and possible bronchoscopy according to last telephone encounter note by patient's oncologist Dr. Lindi Adie    Pneumonia of left lower lobe due to infectious organism   As mentioned above, concern for postobstructive pneumonia on CT imaging of the chest performed on 3/22 with redemonstration of a vague left lower lobe infiltrate on chest x-ray today.  Patient presenting with substantial leukocytosis and tachypnea  Initiating patient on intravenous ceftriaxone and azithromycin  Blood cultures obtained  Ordering CRP and procalcitonin  Supplemental oxygen as necessary to achieve oxygen saturations greater than 92%.    Paroxysmal atrial fibrillation (HCC)   Longstanding known history of paroxysmal atrial fibrillation on chronic  Eliquis therapy as well as remote history of pacemaker placement.  Patient currently rate controlled in normal sinus rhythm  Holding Eliquis temporarily according to neurology recommendations  Monitoring patient on telemetry    Myelodysplastic syndrome [facility])   Continue outpatient oncology   Coronary artery disease involving native coronary artery   Mixed hyperlipidemia   Chronic diastolic CHF (congestive heart failure) (HCC)   Chronic anticoagulation   ***  Code Status:  {Palliative Code status:23503} Family Communication: ***   Status is: Observation  {Observation:23811}  Dispo: The patient is from: {From:23814}              Anticipated d/c is to: {To:23815}              Patient currently {Medically stable:23817}   Difficult to place patient {Yes/No:25151}        Vernelle Emerald MD [facility] Pager [phone]  If 7PM-7AM, please contact night-coverage [email or web address] Use universal [place] password for that web site. If you do not have the password, please call the hospital operator.  12/13/2020, 1:45 AM

## 2020-12-13 NOTE — Progress Notes (Addendum)
[facility] PROGRESS NOTE    Progress Note  Ricky [name]  WFU:[id number] DOB: 1930/11/10 DOA: 12/12/2020 PCP: Roetta Sessions, NP     Brief Narrative:   Ricky [name] is an 87 y.o. 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 12/13/2020 2 CT angio of the head no intracranial large vessel occlusions  Antibiotics: 12/12/2020 Rocephin and azithromycin  Microbiology data: Blood culture:  Procedures: None  Assessment/Plan:   Acute metabolic encephalopathy: He was also noticed to be significantly thyroid check on admission and being a poor historian. The most likely etiology likely due to infectious process. He was started empirically on antibiotics and IV fluids. Still encephalopathic this morning question of underlying dementia superimposed on metabolic encephalopathy, further work-up for dementia as an outpatient. Placed n.p.o. until speech evaluation. CT of the head showed no acute findings  Possible postobstructive left lower lobe pneumonia: With a new left lower lobe infiltrate on chest x-ray today mild  leukocytosis tachycardic encephalopathic. Blood cultures obtained. Started empirically on Rocephin and azithromycin. Procalcitonin mildly elevated leukocytosis is improving he has remained afebrile.  Necrotic mass of the left lower lobe: Incidentally diagnosed on previous visits to the ED. Patient is to undergo outpatient pulmonary evaluation for possible bronchoscopy on last note in a telephone encounter by Dr. Lindi Adie.  Fall at home, initial encounter There was a report of left upper extremity weakness and facial droop concern by the ED MRI cannot be performed due to pacemaker neurology was consulted recommended admission and monitor with serial neurological examination and TIA work-up. Eliquis was held and transition to baby aspirin. CT scan of the head is pending. There is a concern about postobstructive pneumonia due to his ongoing cough during the admission interview. Consult physical therapy and Occupational Therapy.  Paroxysmal atrial fibrillation: Currently rate controlled in sinus rhythm holding Eliquis temporarily according to neurology recommendations.  Middle dysplastic syndrome: Follow-up with Dr. Ladona Mow as an outpatient.  Coronary artery disease involving the native arteries: Currently chest pain-free continue current home regimen.  Chronic diastolic heart failure: Slightly hypervolemic on admission. Diuretic therapy was held he was started on IV fluid hydration     DVT prophylaxis: Lovenox Family Communication: Son Status is: Observation  The patient remains OBS appropriate and will d/c before 2 midnights.  Dispo: The patient is from: Home              Anticipated d/c is to: SNF              Patient currently is not medically stable to d/c.   Difficult to place patient No        Code Status:     Code Status Orders  (From admission, onward)  Start     Ordered   12/13/20 0136  Full code  Continuous        12/13/20 0135        Code  Status History    Date Active Date Inactive Code Status Order ID Comments User Context   11/22/2020 2354 11/23/2020 2326 Full Code [id number]  Elwyn Reach, MD ED   12/26/2019 1844 12/27/2019 2316 Full Code [id number]  Alma Friendly, MD ED   Advance Care Planning Activity        IV Access:    Peripheral IV   Procedures and diagnostic studies:   CT Angio Head W or Wo Contrast  Result Date: 12/12/2020 CLINICAL DATA:  Neuro deficit, acute, stroke suspected. EXAM: CT ANGIOGRAPHY HEAD AND NECK TECHNIQUE: Multidetector CT imaging of the head and neck was performed using the standard protocol during bolus administration of intravenous contrast. Multiplanar CT image reconstructions and MIPs were obtained to evaluate the vascular anatomy. Carotid stenosis measurements (when applicable) are obtained utilizing NASCET criteria, using the distal internal carotid diameter as the denominator. CONTRAST:  69mL OMNIPAQUE IOHEXOL 350 MG/ML SOLN COMPARISON:  No pertinent prior exams available for comparison. FINDINGS: CTA NECK FINDINGS Aortic arch: Standard aortic branching. Atherosclerotic plaque within the visualized aortic arch and proximal major branch vessels of the neck. No hemodynamically significant innominate or proximal subclavian artery stenosis. Right carotid system: CCA and ICA patent within the neck. Calcified plaque within the CCA, carotid bifurcation and proximal ICA without hemodynamically significant stenosis (50% or greater). Left carotid system: CCA and ICA patent within the neck. Calcified plaque within the CCA, carotid bifurcation and proximal ICA without hemodynamically significant stenosis (50% or greater). Vertebral arteries: Codominant and patent within the neck without stenosis. Skeleton: No acute bony abnormality or aggressive osseous lesion. Other neck: No neck mass or cervical lymphadenopathy. Thyroid unremarkable. Upper chest: Areas of scarring within the imaged lung apices. No  consolidation within the imaged lung apices. Review of the MIP images confirms the above findings CTA HEAD FINDINGS Anterior circulation: The intracranial internal carotid arteries are patent. Calcified plaque within both vessels with no more than mild stenosis. The M1 middle cerebral arteries are patent. No M2 proximal branch occlusion or high-grade proximal stenosis is identified. The anterior cerebral arteries are patent. 2 mm vascular protrusion arising from the paraclinoid right ICA which may reflect an aneurysm or infundibulum (series 9, image 94). Posterior circulation: The intracranial vertebral arteries are patent. Minimal nonstenotic calcified plaque within the V4 left vertebral artery. The basilar artery is patent. The posterior cerebral arteries are patent. Posterior communicating arteries are hypoplastic or absent bilaterally. Venous sinuses: Within the limitations of contrast timing, no convincing thrombus. Anatomic variants: As described Review of the MIP images confirms the above findings These results were called by telephone at the time of interpretation on 12/12/2020 at 8:04 pm to provider Dr. Rory Percy, who verbally acknowledged these results. IMPRESSION: CTA neck: 1. The bilateral common carotid and internal carotid arteries are patent within the neck without hemodynamically significant stenosis (50% or greater). Calcified plaque within both carotid systems within the neck. 2. Vertebral arteries patent within the neck bilaterally without stenosis. 3.  Aortic Atherosclerosis (ICD10-I70.0). CTA head: No intracranial large vessel occlusion or proximal high-grade arterial stenosis. Electronically Signed   By: Kellie Simmering DO   On: 12/12/2020 20:25   CT Angio Neck W and/or Wo Contrast  Result Date: 12/12/2020 CLINICAL DATA:  Neuro deficit, acute, stroke suspected. EXAM: CT ANGIOGRAPHY HEAD AND NECK  TECHNIQUE: Multidetector CT imaging of the head and neck was performed using the standard protocol  during bolus administration of intravenous contrast. Multiplanar CT image reconstructions and MIPs were obtained to evaluate the vascular anatomy. Carotid stenosis measurements (when applicable) are obtained utilizing NASCET criteria, using the distal internal carotid diameter as the denominator. CONTRAST:  12mL OMNIPAQUE IOHEXOL 350 MG/ML SOLN COMPARISON:  No pertinent prior exams available for comparison. FINDINGS: CTA NECK FINDINGS Aortic arch: Standard aortic branching. Atherosclerotic plaque within the visualized aortic arch and proximal major branch vessels of the neck. No hemodynamically significant innominate or proximal subclavian artery stenosis. Right carotid system: CCA and ICA patent within the neck. Calcified plaque within the CCA, carotid bifurcation and proximal ICA without hemodynamically significant stenosis (50% or greater). Left carotid system: CCA and ICA patent within the neck. Calcified plaque within the CCA, carotid bifurcation and proximal ICA without hemodynamically significant stenosis (50% or greater). Vertebral arteries: Codominant and patent within the neck without stenosis. Skeleton: No acute bony abnormality or aggressive osseous lesion. Other neck: No neck mass or cervical lymphadenopathy. Thyroid unremarkable. Upper chest: Areas of scarring within the imaged lung apices. No consolidation within the imaged lung apices. Review of the MIP images confirms the above findings CTA HEAD FINDINGS Anterior circulation: The intracranial internal carotid arteries are patent. Calcified plaque within both vessels with no more than mild stenosis. The M1 middle cerebral arteries are patent. No M2 proximal branch occlusion or high-grade proximal stenosis is identified. The anterior cerebral arteries are patent. 2 mm vascular protrusion arising from the paraclinoid right ICA which may reflect an aneurysm or infundibulum (series 9, image 94). Posterior circulation: The intracranial vertebral arteries  are patent. Minimal nonstenotic calcified plaque within the V4 left vertebral artery. The basilar artery is patent. The posterior cerebral arteries are patent. Posterior communicating arteries are hypoplastic or absent bilaterally. Venous sinuses: Within the limitations of contrast timing, no convincing thrombus. Anatomic variants: As described Review of the MIP images confirms the above findings These results were called by telephone at the time of interpretation on 12/12/2020 at 8:04 pm to provider Dr. Rory Percy, who verbally acknowledged these results. IMPRESSION: CTA neck: 1. The bilateral common carotid and internal carotid arteries are patent within the neck without hemodynamically significant stenosis (50% or greater). Calcified plaque within both carotid systems within the neck. 2. Vertebral arteries patent within the neck bilaterally without stenosis. 3.  Aortic Atherosclerosis (ICD10-I70.0). CTA head: No intracranial large vessel occlusion or proximal high-grade arterial stenosis. Electronically Signed   By: Kellie Simmering DO   On: 12/12/2020 20:25   DG Chest Port 1 View  Result Date: 12/12/2020 CLINICAL DATA:  Cough, weakness EXAM: PORTABLE CHEST 1 VIEW COMPARISON:  12/10/2020 FINDINGS: Left pacer remains in place, unchanged. Cardiomegaly, vascular congestion. Left base opacity again noted, improved since prior study. No confluent opacity on the right. No visible effusions. IMPRESSION: Cardiomegaly, vascular congestion. Left base atelectasis or infiltrate, improving since prior study. Electronically Signed   By: Rolm Baptise M.D.   On: 12/12/2020 23:08   CT HEAD CODE STROKE WO CONTRAST`  Result Date: 12/12/2020 CLINICAL DATA:  Code stroke. Additional history provided: Neuro deficit, acute, stroke suspected. Additional history provided: Last known well on known, left-sided weakness/neglect. EXAM: CT HEAD WITHOUT CONTRAST TECHNIQUE: Contiguous axial images were obtained from the base of the skull through  the vertex without intravenous contrast. COMPARISON:  Prior head CT 12/10/2020. FINDINGS: Brain: Streak and beam hardening artifact arising from dental restoration partially obscures the  occipital lobes and significantly obscures the posterior fossa. Moderate cerebral atrophy. Comparatively mild cerebellar atrophy. Commensurate prominence of the ventricles and sulci. Mild patchy and ill-defined hypoattenuation within the cerebral white matter is nonspecific, but compatible with chronic small vessel ischemic disease. Redemonstrated chronic lacunar infarct within the right lentiform nucleus. Within the limitations of motion degradation, no acute intracranial hemorrhage or acute demarcated cortical infarct is identified. No extra-axial fluid collection. No evidence of intracranial mass. No midline shift. Vascular: No appreciable hyperdense vessel. Atherosclerotic calcifications. Skull: Normal. Negative for fracture or focal lesion. Sinuses/Orbits: Visualized orbits show no acute finding. Mild mucosal thickening within the right sphenoid sinus. ASPECTS ([place] Stroke Program Early CT Score) - Ganglionic level infarction (caudate, lentiform nuclei, internal capsule, insula, M1-M3 cortex): 7 - Supraganglionic infarction (M4-M6 cortex): 3 Total score (0-10 with 10 being normal): 10 These results were called by telephone at the time of interpretation on 12/12/2020 at 8:04 pm to provider [facility] , who verbally acknowledged these results. IMPRESSION: Streak and beam hardening artifact arising from dental restoration partially obscures the occipital lobes and significantly obscures the posterior fossa. Within this limitation, there is no evidence of acute intracranial abnormality. ASPECTS is 10. Redemonstrated chronic right basal ganglia lacunar infarct. Moderate cerebral atrophy with mild cerebral white matter chronic small vessel ischemic disease. Comparatively mild cerebellar atrophy. Electronically Signed   By: Kellie Simmering DO   On: 12/12/2020 20:05     Medical Consultants:    None.   Subjective:    [name] mumbling relating that he wants his door open, then started speaking in non-English language  Objective:    Vitals:   12/13/20 0100 12/13/20 0130 12/13/20 0200 12/13/20 0630  BP: 117/90 (!) 117/55 (!) 102/52 113/60  Pulse: 94 92 89 90  Resp: (!) 24 (!) 27 (!) 26 (!) 26  Temp:   98 F (36.7 C)   TempSrc:   Oral   SpO2: 91% 96% 95% 93%   SpO2: 93 % O2 Flow Rate (L/min): 2 L/min   Intake/Output Summary (Last 24 hours) at 12/13/2020 0731 Last data filed at 12/13/2020 0504 Gross per 24 hour  Intake 350 ml  Output --  Net 350 ml   There were no vitals filed for this visit.  Exam: General exam: In no acute distress. Respiratory system: Good air movement and clear to auscultation. Cardiovascular system: S1 & S2 heard, RRR. No JVD. Gastrointestinal system: Abdomen is nondistended, soft and nontender.  Central nervous system: Moving all 4 extremities without any difficulties I cannot appreciate any facial droop muscle strength is symmetrical on all 4 extremities and 5 out of 5 Extremities: No pedal edema. Skin: No rashes, lesions or ulcers Psychiatry: No judgment and insight of medical condition.   Data Reviewed:    Labs: Basic Metabolic Panel: Recent Labs  Lab 12/09/20 1506 12/10/20 1131 12/12/20 1800 12/12/20 2017 12/13/20 0130  NA 133* 136 131* 133* 130*  K 4.2 3.8 4.0 4.0 4.1  CL 98 103 96* 95* 97*  CO2 22 27 26   --  25  GLUCOSE 105* 118* 132* 125* 110*  BUN 12 11 22  27* 23  CREATININE 0.97 0.90 1.39* 1.20 1.12  CALCIUM 7.7* 8.2* 8.5*  --  7.9*  MG  --   --   --   --  2.3   GFR Estimated Creatinine Clearance: 53.6 mL/min (by C-G formula based on SCr of 1.12 mg/dL). Liver Function Tests: Recent Labs  Lab 12/12/20 1800 12/13/20 0130  AST 27 26  ALT 57* 47*  ALKPHOS 109 85  BILITOT 1.1 1.5*  PROT 8.5* 7.1  ALBUMIN 2.9* 2.4*   No results  for input(s): LIPASE, AMYLASE in the last 168 hours. No results for input(s): AMMONIA in the last 168 hours. Coagulation profile Recent Labs  Lab 12/12/20 1913  INR 1.7*   COVID-19 Labs  Recent Labs    12/13/20 0215  CRP 14.3*    Lab Results  Component Value Date   SARSCOV2NAA NEGATIVE 12/12/2020   SARSCOV2NAA NEGATIVE 11/22/2020   SARSCOV2NAA POSITIVE (A) 12/26/2019    CBC: Recent Labs  Lab 12/10/20 1131 12/12/20 1800 12/12/20 2017 12/13/20 0136  WBC 12.8* 22.7*  --  16.4*  NEUTROABS  --  18.6*  --  13.0*  HGB 7.9* 8.5* 7.8* 7.2*  HCT 25.2* 27.2* 23.0* 22.7*  MCV 91.3 91.6  --  90.4  PLT 273 277  --  188   Cardiac Enzymes: No results for input(s): CKTOTAL, CKMB, CKMBINDEX, TROPONINI in the last 168 hours. BNP (last 3 results) No results for input(s): PROBNP in the last 8760 hours. CBG: No results for input(s): GLUCAP in the last 168 hours. D-Dimer: No results for input(s): DDIMER in the last 72 hours. Hgb A1c: Recent Labs    12/13/20 0130  HGBA1C 6.7*   Lipid Profile: Recent Labs    12/13/20 0130  CHOL 115  HDL 31*  LDLCALC 68  TRIG 81  CHOLHDL 3.7   Thyroid function studies: No results for input(s): TSH, T4TOTAL, T3FREE, THYROIDAB in the last 72 hours.  Invalid input(s): FREET3 Anemia work up: No results for input(s): VITAMINB12, FOLATE, FERRITIN, TIBC, IRON, RETICCTPCT in the last 72 hours. Sepsis Labs: Recent Labs  Lab 12/10/20 1131 12/12/20 1800 12/13/20 0136 12/13/20 0215  PROCALCITON  --   --   --  0.55  WBC 12.8* 22.7* 16.4*  --   LATICACIDVEN  --  1.4  --   --    Microbiology Recent Results (from the past 240 hour(s))  Resp Panel by RT-PCR (Flu A&B, Covid) Nasopharyngeal Swab     Status: None   Collection Time: 12/12/20  7:13 PM   Specimen: Nasopharyngeal Swab; Nasopharyngeal(NP) swabs in vial transport medium  Result Value Ref Range Status   SARS Coronavirus 2 by RT PCR NEGATIVE NEGATIVE Final    Comment:  (NOTE) SARS-CoV-2 target nucleic acids are NOT DETECTED.  The SARS-CoV-2 RNA is generally detectable in upper respiratory specimens during the acute phase of infection. The lowest concentration of SARS-CoV-2 viral copies this assay can detect is 138 copies/mL. A negative result does not preclude SARS-Cov-2 infection and should not be used as the sole basis for treatment or other patient management decisions. A negative result may occur with  improper specimen collection/handling, submission of specimen other than nasopharyngeal swab, presence of viral mutation(s) within the areas targeted by this assay, and inadequate number of viral copies(<138 copies/mL). A negative result must be combined with clinical observations, patient history, and epidemiological information. The expected result is Negative.  Fact Sheet for Patients:  EntrepreneurPulse.com.au  Fact Sheet for Healthcare Providers:  IncredibleEmployment.be  This test is no t yet approved or cleared by the [place] FDA and  has been authorized for detection and/or diagnosis of SARS-CoV-2 by FDA under an Emergency Use Authorization (EUA). This EUA will remain  in effect (meaning this test can be used) for the duration of the COVID-19 declaration under Section 564(b)(1) of the Act, 21 U.S.C.section 360bbb-3(b)(1), unless  the authorization is terminated  or revoked sooner.       Influenza A by PCR NEGATIVE NEGATIVE Final   Influenza B by PCR NEGATIVE NEGATIVE Final    Comment: (NOTE) The Xpert Xpress SARS-CoV-2/FLU/RSV plus assay is intended as an aid in the diagnosis of influenza from Nasopharyngeal swab specimens and should not be used as a sole basis for treatment. Nasal washings and aspirates are unacceptable for Xpert Xpress SARS-CoV-2/FLU/RSV testing.  Fact Sheet for Patients: EntrepreneurPulse.com.au  Fact Sheet for Healthcare  Providers: IncredibleEmployment.be  This test is not yet approved or cleared by the [place] FDA and has been authorized for detection and/or diagnosis of SARS-CoV-2 by FDA under an Emergency Use Authorization (EUA). This EUA will remain in effect (meaning this test can be used) for the duration of the COVID-19 declaration under Section 564(b)(1) of the Act, 21 U.S.C. section 360bbb-3(b)(1), unless the authorization is terminated or revoked.  Performed at [facility], [place] [address], [place], [place] [address]      Medications:   .  stroke: mapping our early stages of recovery book   Does not apply Once  . aspirin  81 mg Oral Daily  . deferasirox  1,000 mg Oral QAC breakfast  . simvastatin  20 mg Oral Daily  . [START ON 12/14/2020] torsemide  20 mg Oral Daily  . cyanocobalamin  1,000 mcg Oral Daily   Continuous Infusions: . azithromycin Stopped (12/13/20 0504)  . cefTRIAXone (ROCEPHIN)  IV Stopped (12/13/20 0402)      LOS: 0 days   Charlynne Cousins  [facility]  12/13/2020, 7:31 AM

## 2020-12-13 NOTE — Telephone Encounter (Signed)
Dr. Valeta Harms, please see mychart message sent by pt's family as an FYI. Pt currently has a consult scheduled with you on 3/31 and stated to the family in [place] message that we could reschedule the consult if pt is still in the hospital the day of the appt.

## 2020-12-13 NOTE — ED Notes (Signed)
Patient transported to CT 

## 2020-12-13 NOTE — Evaluation (Signed)
Clinical/Bedside Swallow Evaluation Patient Details  Name: Ricky [name] MRN: [id number] Date of Birth: [birth year]  Today's Date: 12/13/2020 Time: SLP Start Time (ACUTE ONLY): 1340 SLP Stop Time (ACUTE ONLY): 1350 SLP Time Calculation (min) (ACUTE ONLY): 10 min  Past Medical History:  Past Medical History:  Diagnosis Date  . A-fib ([place])   . Anemia   . Anemia of chronic disease 02/02/2020  . Atrial fibrillation ([place])   . Boerhaave's syndrome   . Chronic anticoagulation 01/22/2020  . Colon polyps   . COVID-19   . Hx of colonic polyps 01/22/2020   2017 - [place]  . Hypomagnesemia   . Low grade myelodysplastic syndrome lesions ([place]) 02/02/2020  . Lower urinary tract symptoms due to benign prostatic hyperplasia   . Pacemaker   . Paroxysmal atrial fibrillation ([facility])   . Prostate enlargement   . Symptomatic anemia 12/26/2019  . Vitamin D deficiency    Past Surgical History:  Past Surgical History:  Procedure Laterality Date  . esophogeal tear repair    . PACEMAKER INSERTION  2009, 2017  . THORACOTOMY     esophageal perforation  . TOTAL HIP ARTHROPLASTY Right   . 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 Report of left arm weakness and a facial droop, Acute metabolic encephalopathy. CT of the head showed no acute findings. Pt initially passed the [facility] swallow screen but was made NPO again due to AMS.   Assessment / Plan / Recommendation Clinical Impression  Pt has baseline coughing that is not observed again until the very end of PO intake.At this point pt was also getting more restless and wanting to  go home, so he was not as agreeable to additional challenging. Given that his oral motor exam was [facility] and he had already passed a [facility] swallow screen, will resume diet for now (regular solids, thin liquids) but with f/u at least briefly for ongoing assessment. SLP Visit Diagnosis: Dysphagia, unspecified (R13.10)    Aspiration Risk  Mild aspiration risk    Diet Recommendation Regular;Thin liquid   Liquid Administration via: Cup;Straw Medication Administration: Whole meds with puree Supervision: Staff to assist with self feeding;Full supervision/cueing for compensatory strategies Compensations: Slow rate;Small sips/bites;Minimize environmental distractions Postural Changes: Seated upright at 90 degrees;Remain upright for at least 30 minutes after po intake    Other  Recommendations Oral Care Recommendations: Oral care BID   Follow up Recommendations 24 hour supervision/assistance      Frequency and Duration min 1 x/week  1 week       Prognosis Prognosis for Safe Diet Advancement: Good Barriers to Reach Goals: Cognitive deficits      Swallow Study   General HPI: Ricky [name] past medical history of Miller dysplastic syndrome, paroxysmal atrial fibrillation on Eliquis systolic heart failure Boerhaave syndrome presented to the ED for recurrent falls, he was recently seen in the ED with rib fractures for mechanical fall, incidentally was also found to have a large necrotic appearing lung mass in the left lower lobe concerning for bronchogenic carcinoma. Report of left arm weakness and a facial droop, Acute metabolic encephalopathy. CT of the head showed  no acute findings. Pt initially passed the [facility] swallow screen but was made NPO again due to AMS. Type of Study: Bedside Swallow Evaluation Previous Swallow Assessment: none in chart Diet Prior to this Study: NPO Temperature Spikes Noted: No Respiratory Status: Room air History of Recent Intubation: No Behavior/Cognition:  Alert;Cooperative;Confused;Requires cueing Oral Cavity Assessment: Within Functional Limits Oral Care Completed by SLP: No Oral Cavity - Dentition: Adequate natural dentition Vision: Functional for self-feeding Self-Feeding Abilities: Able to feed self Patient Positioning: Upright in bed Baseline Vocal Quality: Normal Volitional Cough: Wet Volitional Swallow: Able to elicit    Oral/Motor/Sensory Function Overall Oral Motor/Sensory Function: Within functional limits   Ice Chips Ice chips: Not tested   Thin Liquid Thin Liquid: Impaired Presentation: Cup;Self Fed;Straw Pharyngeal  Phase Impairments: Cough - Delayed (x1)    Nectar Thick Nectar Thick Liquid: Not tested   Honey Thick Honey Thick Liquid: Not tested   Puree Puree: Within functional limits Presentation: Spoon;Self Fed   Solid     Solid: Within functional limits Presentation: Self Fed      Osie Bond., M.A. [place] Pager [phone] Office [phone]  12/13/2020,3:36 PM

## 2020-12-13 NOTE — Telephone Encounter (Signed)
Scheduled appt per 3/24 sch msg. Pt's son is aware.

## 2020-12-13 NOTE — H&P (Signed)
History and Physical    Ricky [name] YNW:[id number] DOB: 03-07-1931 DOA: 12/12/2020  PCP: Roetta Sessions, NP  Patient coming from: Home   Chief Complaint: Fall   HPI:    87 year old male with past medical history of myelodysplastic syndrome, paroxysmal atrial fibrillation (on Eliquis, S/P Pacemaker), coronary artery disease,  systolic congestive heart failure (Echo 05/2020 EF 60-65%), Boerhaave syndrome who presents to [facility] emergency department with his son due to a recurrent fall.  Of note, patient had recently presented to our emergency department on 3/22 after suffering a fall at home.  At that time, patient was diagnosed with left lateral eighth rib fracture.  Incidentally, patient was also found to have a large necrotic appearing lung mass in the left lower lobe concerning for bronchogenic carcinoma.  This was communicated to the patient's oncologist Dr. Lindi Adie on that day who promptly arrange for the patient to follow-up as an outpatient with pulmonology for evaluation and possible bronchoscopy.  On patient's presentation today he is lethargic and confused and is a poor historian secondary to this.  Majority the history is been obtained from discussions with the emergency department staff as well as review of emergency department notes.  I have attempted to contact the son via phone number with no answer.  Unfortunately, earlier today on 3/24 the patient has suffered another fall at home.  This time, the patient reports a fall in the bathroom.  Patient states that he "slipped."  Patient denies loss of consciousness.  There was no witnessed tonic-clonic/seizure-like activity.  After the fall, patient was unable to get up prompting the patient spouse to going to the bathroom and helped him up.  Upon further questioning patient does endorse a recent history of cough but is unable to provide further detail.  Patient additionally denies fevers, chest pain or  shortness of breath.  Upon presentation to [facility] emergency department, there was initial concern for left arm weakness and left facial droop on initial exam concerning for possible stroke or TIA.  Case was between the emergency department provider and Dr. Rory Percy with neurology.  Recommendation was for patient to be hospitalized for usual TIA/stroke work-up.  Unfortunately MRI brain cannot be performed due to remote history of pacemaker placement.  The hospitalist group was then called to assess the patient for admission to the hospital.  Review of Systems:   Review of Systems  Unable to perform ROS: Mental status change    Past Medical History:  Diagnosis Date  . A-fib ([place])   . Anemia   . Anemia of chronic disease 02/02/2020  . Atrial fibrillation ([place])   . Boerhaave's syndrome   . Chronic anticoagulation 01/22/2020  . Colon polyps   . COVID-19   . Hx of colonic polyps 01/22/2020   2017 - [place]  . Hypomagnesemia   . Low grade myelodysplastic syndrome lesions ([place]) 02/02/2020  . Lower urinary tract symptoms due to benign prostatic hyperplasia   . Pacemaker   . Paroxysmal atrial fibrillation (H[facility])   . Prostate enlargement   . Symptomatic anemia 12/26/2019  . Vitamin D deficiency     Past Surgical History:  Procedure Laterality Date  . esophogeal tear repair    . PACEMAKER INSERTION  2009, 2017  . THORACOTOMY     esophageal perforation  . TOTAL HIP ARTHROPLASTY Right   . TOTAL KNEE ARTHROPLASTY Left      reports that he quit smoking about 50 years ago. His smoking use included  cigarettes. He has never used smokeless tobacco. He reports previous alcohol use. He reports that he does not use drugs.  No Known Allergies  Family History  Problem Relation Age of Onset  . Cirrhosis Mother   . Alcoholism Mother   . Heart attack Father 5  . Heart disease Brother   . Diabetes Brother   . Brain cancer Daughter      Prior to Admission medications   Medication Sig Start  Date End Date Taking? Authorizing Provider  apixaban (ELIQUIS) 5 MG TABS tablet Take 1 tablet (5 mg total) by mouth 2 (two) times daily. 02/29/20  Yes Nahser, Wonda Cheng, MD  cholecalciferol (VITAMIN D3) 25 MCG (1000 UNIT) tablet Take 1,000 Units by mouth daily.   Yes [provider]  cyanocobalamin 1000 MCG tablet Take 1,000 mcg by mouth daily.   Yes [provider]  Magnesium 250 MG TABS Take 1 tablet (250 mg total) by mouth daily. 12/27/19  Yes Barb Merino, MD  simvastatin (ZOCOR) 20 MG tablet Take 20 mg by mouth daily.   Yes [provider]  torsemide (DEMADEX) 20 MG tablet Take 1 tablet (20 mg total) by mouth 2 (two) times daily. 12/09/20  Yes Nahser, Wonda Cheng, MD  traMADol (ULTRAM) 50 MG tablet Take 1 tablet (50 mg total) by mouth every 12 (twelve) hours as needed. 12/10/20  Yes Davonna Belling, MD  deferasirox (EXJADE) 500 MG disintegrating tablet Take 2 tablets (1,000 mg total) by mouth daily before breakfast. 07/26/20   Nicholas Lose, MD    Physical Exam: Vitals:   12/13/20 0030 12/13/20 0100 12/13/20 0130 12/13/20 0200  BP: 121/64 117/90 (!) 117/55 (!) 102/52  Pulse: 89 94 92 89  Resp: 19 (!) 24 (!) 27 (!) 26  Temp:    98 F (36.7 C)  TempSrc:    Oral  SpO2: 92% 91% 96% 95%    Constitutional: Lethargic but arousable and oriented x2, no associated distress.   Skin: no rashes, no lesions, notable poor skin turgor. Eyes: Pupils are equally reactive to light.  No evidence of scleral icterus or conjunctival pallor.  ENMT: Dry mucous membranes noted.  Posterior pharynx clear of any exudate or lesions.   Neck: normal, supple, no masses, no thyromegaly.  No evidence of jugular venous distension.   Respiratory: Notable bibasilar rales with particularly coarse breath sounds in the left base with associated rales.  No evidence of wheezing.  Patient is tachypneic with increased respiratory effort but no evidence of accessory muscle use.   Cardiovascular: Regular  rate and rhythm, no murmurs / rubs / gallops. No extremity edema. 2+ pedal pulses. No carotid bruits.  Chest:   Nontender without crepitus or deformity.   Back:   Nontender without crepitus or deformity. Abdomen: Abdomen is soft and nontender.  No evidence of intra-abdominal masses.  Positive bowel sounds noted in all quadrants.   Musculoskeletal: No joint deformity upper and lower extremities. Good ROM, no contractures. Normal muscle tone.  Neurologic: CN 2-12 grossly intact. Sensation intact.  Patient moving all 4 extremities spontaneously.  Patient is following all commands.  Patient is responsive to verbal stimuli.   Psychiatric: Patient exhibits angry mood with flat affect.  Patient currently does not seem to possess insight as to his current situation.     Labs on Admission: I have personally reviewed following labs and imaging studies -   CBC: Recent Labs  Lab 12/10/20 1131 12/12/20 1800 12/12/20 2017  WBC 12.8* 22.7*  --  NEUTROABS  --  18.6*  --   HGB 7.9* 8.5* 7.8*  HCT 25.2* 27.2* 23.0*  MCV 91.3 91.6  --   PLT 273 277  --    Basic Metabolic Panel: Recent Labs  Lab 12/09/20 1506 12/10/20 1131 12/12/20 1800 12/12/20 2017  NA 133* 136 131* 133*  K 4.2 3.8 4.0 4.0  CL 98 103 96* 95*  CO2 22 27 26   --   GLUCOSE 105* 118* 132* 125*  BUN 12 11 22  27*  CREATININE 0.97 0.90 1.39* 1.20  CALCIUM 7.7* 8.2* 8.5*  --    GFR: Estimated Creatinine Clearance: 50 mL/min (by C-G formula based on SCr of 1.2 mg/dL). Liver Function Tests: Recent Labs  Lab 12/12/20 1800  AST 27  ALT 57*  ALKPHOS 109  BILITOT 1.1  PROT 8.5*  ALBUMIN 2.9*   No results for input(s): LIPASE, AMYLASE in the last 168 hours. No results for input(s): AMMONIA in the last 168 hours. Coagulation Profile: Recent Labs  Lab 12/12/20 1913  INR 1.7*   Cardiac Enzymes: No results for input(s): CKTOTAL, CKMB, CKMBINDEX, TROPONINI in the last 168 hours. BNP (last 3 results) No results for input(s):  PROBNP in the last 8760 hours. HbA1C: No results for input(s): HGBA1C in the last 72 hours. CBG: No results for input(s): GLUCAP in the last 168 hours. Lipid Profile: No results for input(s): CHOL, HDL, LDLCALC, TRIG, CHOLHDL, LDLDIRECT in the last 72 hours. Thyroid Function Tests: No results for input(s): TSH, T4TOTAL, FREET4, T3FREE, THYROIDAB in the last 72 hours. Anemia Panel: No results for input(s): VITAMINB12, FOLATE, FERRITIN, TIBC, IRON, RETICCTPCT in the last 72 hours. Urine analysis:    Component Value Date/Time   COLORURINE YELLOW 12/12/2020 2222   APPEARANCEUR CLEAR 12/12/2020 2222   LABSPEC 1.029 12/12/2020 2222   PHURINE 5.0 12/12/2020 2222   GLUCOSEU NEGATIVE 12/12/2020 2222   HGBUR SMALL (A) 12/12/2020 2222   BILIRUBINUR NEGATIVE 12/12/2020 2222   KETONESUR NEGATIVE 12/12/2020 2222   PROTEINUR NEGATIVE 12/12/2020 2222   NITRITE NEGATIVE 12/12/2020 2222   LEUKOCYTESUR NEGATIVE 12/12/2020 2222    Radiological Exams on Admission - Personally Reviewed: CT Angio Head W or Wo Contrast  Result Date: 12/12/2020 CLINICAL DATA:  Neuro deficit, acute, stroke suspected. EXAM: CT ANGIOGRAPHY HEAD AND NECK TECHNIQUE: Multidetector CT imaging of the head and neck was performed using the standard protocol during bolus administration of intravenous contrast. Multiplanar CT image reconstructions and MIPs were obtained to evaluate the vascular anatomy. Carotid stenosis measurements (when applicable) are obtained utilizing NASCET criteria, using the distal internal carotid diameter as the denominator. CONTRAST:  37mL OMNIPAQUE IOHEXOL 350 MG/ML SOLN COMPARISON:  No pertinent prior exams available for comparison. FINDINGS: CTA NECK FINDINGS Aortic arch: Standard aortic branching. Atherosclerotic plaque within the visualized aortic arch and proximal major branch vessels of the neck. No hemodynamically significant innominate or proximal subclavian artery stenosis. Right carotid system: CCA  and ICA patent within the neck. Calcified plaque within the CCA, carotid bifurcation and proximal ICA without hemodynamically significant stenosis (50% or greater). Left carotid system: CCA and ICA patent within the neck. Calcified plaque within the CCA, carotid bifurcation and proximal ICA without hemodynamically significant stenosis (50% or greater). Vertebral arteries: Codominant and patent within the neck without stenosis. Skeleton: No acute bony abnormality or aggressive osseous lesion. Other neck: No neck mass or cervical lymphadenopathy. Thyroid unremarkable. Upper chest: Areas of scarring within the imaged lung apices. No consolidation within the imaged lung apices. Review of  the MIP images confirms the above findings CTA HEAD FINDINGS Anterior circulation: The intracranial internal carotid arteries are patent. Calcified plaque within both vessels with no more than mild stenosis. The M1 middle cerebral arteries are patent. No M2 proximal branch occlusion or high-grade proximal stenosis is identified. The anterior cerebral arteries are patent. 2 mm vascular protrusion arising from the paraclinoid right ICA which may reflect an aneurysm or infundibulum (series 9, image 94). Posterior circulation: The intracranial vertebral arteries are patent. Minimal nonstenotic calcified plaque within the V4 left vertebral artery. The basilar artery is patent. The posterior cerebral arteries are patent. Posterior communicating arteries are hypoplastic or absent bilaterally. Venous sinuses: Within the limitations of contrast timing, no convincing thrombus. Anatomic variants: As described Review of the MIP images confirms the above findings These results were called by telephone at the time of interpretation on 12/12/2020 at 8:04 pm to provider Dr. Rory Percy, who verbally acknowledged these results. IMPRESSION: CTA neck: 1. The bilateral common carotid and internal carotid arteries are patent within the neck without hemodynamically  significant stenosis (50% or greater). Calcified plaque within both carotid systems within the neck. 2. Vertebral arteries patent within the neck bilaterally without stenosis. 3.  Aortic Atherosclerosis (ICD10-I70.0). CTA head: No intracranial large vessel occlusion or proximal high-grade arterial stenosis. Electronically Signed   By: Kellie Simmering DO   On: 12/12/2020 20:25   CT Angio Neck W and/or Wo Contrast  Result Date: 12/12/2020 CLINICAL DATA:  Neuro deficit, acute, stroke suspected. EXAM: CT ANGIOGRAPHY HEAD AND NECK TECHNIQUE: Multidetector CT imaging of the head and neck was performed using the standard protocol during bolus administration of intravenous contrast. Multiplanar CT image reconstructions and MIPs were obtained to evaluate the vascular anatomy. Carotid stenosis measurements (when applicable) are obtained utilizing NASCET criteria, using the distal internal carotid diameter as the denominator. CONTRAST:  70mL OMNIPAQUE IOHEXOL 350 MG/ML SOLN COMPARISON:  No pertinent prior exams available for comparison. FINDINGS: CTA NECK FINDINGS Aortic arch: Standard aortic branching. Atherosclerotic plaque within the visualized aortic arch and proximal major branch vessels of the neck. No hemodynamically significant innominate or proximal subclavian artery stenosis. Right carotid system: CCA and ICA patent within the neck. Calcified plaque within the CCA, carotid bifurcation and proximal ICA without hemodynamically significant stenosis (50% or greater). Left carotid system: CCA and ICA patent within the neck. Calcified plaque within the CCA, carotid bifurcation and proximal ICA without hemodynamically significant stenosis (50% or greater). Vertebral arteries: Codominant and patent within the neck without stenosis. Skeleton: No acute bony abnormality or aggressive osseous lesion. Other neck: No neck mass or cervical lymphadenopathy. Thyroid unremarkable. Upper chest: Areas of scarring within the imaged lung  apices. No consolidation within the imaged lung apices. Review of the MIP images confirms the above findings CTA HEAD FINDINGS Anterior circulation: The intracranial internal carotid arteries are patent. Calcified plaque within both vessels with no more than mild stenosis. The M1 middle cerebral arteries are patent. No M2 proximal branch occlusion or high-grade proximal stenosis is identified. The anterior cerebral arteries are patent. 2 mm vascular protrusion arising from the paraclinoid right ICA which may reflect an aneurysm or infundibulum (series 9, image 94). Posterior circulation: The intracranial vertebral arteries are patent. Minimal nonstenotic calcified plaque within the V4 left vertebral artery. The basilar artery is patent. The posterior cerebral arteries are patent. Posterior communicating arteries are hypoplastic or absent bilaterally. Venous sinuses: Within the limitations of contrast timing, no convincing thrombus. Anatomic variants: As described Review of the MIP  images confirms the above findings These results were called by telephone at the time of interpretation on 12/12/2020 at 8:04 pm to provider Dr. Rory Percy, who verbally acknowledged these results. IMPRESSION: CTA neck: 1. The bilateral common carotid and internal carotid arteries are patent within the neck without hemodynamically significant stenosis (50% or greater). Calcified plaque within both carotid systems within the neck. 2. Vertebral arteries patent within the neck bilaterally without stenosis. 3.  Aortic Atherosclerosis (ICD10-I70.0). CTA head: No intracranial large vessel occlusion or proximal high-grade arterial stenosis. Electronically Signed   By: Kellie Simmering DO   On: 12/12/2020 20:25   DG Chest Port 1 View  Result Date: 12/12/2020 CLINICAL DATA:  Cough, weakness EXAM: PORTABLE CHEST 1 VIEW COMPARISON:  12/10/2020 FINDINGS: Left pacer remains in place, unchanged. Cardiomegaly, vascular congestion. Left base opacity again  noted, improved since prior study. No confluent opacity on the right. No visible effusions. IMPRESSION: Cardiomegaly, vascular congestion. Left base atelectasis or infiltrate, improving since prior study. Electronically Signed   By: Rolm Baptise M.D.   On: 12/12/2020 23:08   CT HEAD CODE STROKE WO CONTRAST`  Result Date: 12/12/2020 CLINICAL DATA:  Code stroke. Additional history provided: Neuro deficit, acute, stroke suspected. Additional history provided: Last known well on known, left-sided weakness/neglect. EXAM: CT HEAD WITHOUT CONTRAST TECHNIQUE: Contiguous axial images were obtained from the base of the skull through the vertex without intravenous contrast. COMPARISON:  Prior head CT 12/10/2020. FINDINGS: Brain: Streak and beam hardening artifact arising from dental restoration partially obscures the occipital lobes and significantly obscures the posterior fossa. Moderate cerebral atrophy. Comparatively mild cerebellar atrophy. Commensurate prominence of the ventricles and sulci. Mild patchy and ill-defined hypoattenuation within the cerebral white matter is nonspecific, but compatible with chronic small vessel ischemic disease. Redemonstrated chronic lacunar infarct within the right lentiform nucleus. Within the limitations of motion degradation, no acute intracranial hemorrhage or acute demarcated cortical infarct is identified. No extra-axial fluid collection. No evidence of intracranial mass. No midline shift. Vascular: No appreciable hyperdense vessel. Atherosclerotic calcifications. Skull: Normal. Negative for fracture or focal lesion. Sinuses/Orbits: Visualized orbits show no acute finding. Mild mucosal thickening within the right sphenoid sinus. ASPECTS ([place] Stroke Program Early CT Score) - Ganglionic level infarction (caudate, lentiform nuclei, internal capsule, insula, M1-M3 cortex): 7 - Supraganglionic infarction (M4-M6 cortex): 3 Total score (0-10 with 10 being normal): 10 These results  were called by telephone at the time of interpretation on 12/12/2020 at 8:04 pm to provider [facility] , who verbally acknowledged these results. IMPRESSION: Streak and beam hardening artifact arising from dental restoration partially obscures the occipital lobes and significantly obscures the posterior fossa. Within this limitation, there is no evidence of acute intracranial abnormality. ASPECTS is 10. Redemonstrated chronic right basal ganglia lacunar infarct. Moderate cerebral atrophy with mild cerebral white matter chronic small vessel ischemic disease. Comparatively mild cerebellar atrophy. Electronically Signed   By: Kellie Simmering DO   On: 12/12/2020 20:05    EKG: Personally reviewed.  Rhythm is normal sinus rhythm with heart rate of 94 bpm.  No dynamic ST segment changes appreciated.  Assessment/Plan Principal Problem:   Fall at home, initial encounter  Patient is presenting with sequela of yet another fall at home.  Mild patient reports that this fall was mechanical but he is unfortunately an extremely poor historian at this time.  Emergency department staff reports left upper extremity weakness and facial droop upon initial arrival to the emergency department bring about concern for possible TIA.  Patient is now not exhibiting any neurologic deficit.  Neurology was appropriately consulted by the ER provider due to this concern and we will proceed with working up patient  including serial neurologic checks, monitoring patient on telemetry, echocardiogram in the morning, holding Eliquis therapy and transitioning patient to baby aspirin daily as well as PT, OT and SLP evaluations.  Neurology is recommending repeat CT imaging in 24 hours.  That being said, I am more concerned about the likelihood of a developing left lower lobe postobstructive pneumonia.  Patient is actively coughing throughout my entire interview and exhibits coarse breath sounds in the left lower fields.  Furthermore, patient  exhibits a substantial leukocytosis and tachypnea.  For this we are initiating intravenous antibiotic therapy and supplemental oxygen.  Blood cultures are being obtained.  Active Problems:   Acute metabolic encephalopathy   Notable lethargy and confusion throughout the interview with patient being an extremely poor historian.  Most likely etiology is underlying infectious process  Treating underlying pneumonia with intravenous antibiotic therapy, monitoring for spontaneous resolution of encephalopathy.  Temporarily holding usual home regimen of diuretics and hydrating patient gently this evening.  If encephalopathy does not spontaneously resolve will expand work-up.    Mass of lower lobe of left lung   Incidental identification of necrotic left lower lung mass to measuring 5.9 x 5.6 x 6.7 cm.  Evidence of postobstructive inflammatory process identified.  With patient's progressively worsening leukocytosis I am concerned that this is likely a progressive postobstructive pneumonia.  Additional evidence of enlarged left hilar lymph node and prominent cluster of left peritracheal lymph nodes concerning for metastatic adenopathy.  Patient to undergo outpatient pulmonology evaluation and possible bronchoscopy according to last telephone encounter note by patient's oncologist Dr. Lindi Adie    Pneumonia of left lower lobe due to infectious organism   As mentioned above, concern for postobstructive pneumonia on CT imaging of the chest performed on 3/22 with redemonstration of a vague left lower lobe infiltrate on chest x-ray today.  Patient presenting with substantial leukocytosis and tachypnea  Initiating patient on intravenous ceftriaxone and azithromycin  Blood cultures obtained  Ordering CRP and procalcitonin  Supplemental oxygen as necessary to achieve oxygen saturations greater than 92%.    Paroxysmal atrial fibrillation (HCC)   Longstanding known history of paroxysmal atrial  fibrillation on chronic Eliquis therapy as well as remote history of pacemaker placement.  Patient currently rate controlled in normal sinus rhythm  Holding Eliquis temporarily according to neurology recommendations  Monitoring patient on telemetry    Myelodysplastic syndrome [facility])   Continue outpatient oncology follow up with Dr. Lindi Adie  Receiving outpatient weekly transfusions.    Coronary artery disease involving native coronary artery   Patient is currently chest pain free Monitoring patient on telemetry Continue home regimen of antiplatelet therapy, lipid lowering therapy and AV nodal blocking therapy    Mixed hyperlipidemia  . Continuing home regimen of lipid lowering therapy.    Chronic diastolic CHF (congestive heart failure) ([place])  . Patient's diuretic regimen was was recently switched to torsemide in the outpatient setting by cardiology and then switched to twice daily. . Patient is slightly volume depleted on this presentation and therefore diuretics will be held today with some gentle intravenous hydration with daily diuretics to resume on 3/26.   Code Status:  Full code Family Communication: Attempts have been made to contact son via phone but have been unsuccessful.  Status is: Observation  The patient remains OBS appropriate and will d/c before 2  midnights.  Dispo: The patient is from: Home              Anticipated d/c is to: Home              Patient currently is not medically stable to d/c.   Difficult to place patient No        Vernelle Emerald MD [facility] Pager [phone]  If 7PM-7AM, please contact night-coverage [email or web address] Use universal [place] password for that web site. If you do not have the password, please call the hospital operator.  12/13/2020, 2:19 AM

## 2020-12-14 ENCOUNTER — Inpatient Hospital Stay (HOSPITAL_COMMUNITY): Payer: Medicare Other

## 2020-12-14 DIAGNOSIS — G459 Transient cerebral ischemic attack, unspecified: Secondary | ICD-10-CM | POA: Diagnosis not present

## 2020-12-14 DIAGNOSIS — D72829 Elevated white blood cell count, unspecified: Secondary | ICD-10-CM | POA: Diagnosis not present

## 2020-12-14 DIAGNOSIS — G9341 Metabolic encephalopathy: Secondary | ICD-10-CM

## 2020-12-14 DIAGNOSIS — W19XXXA Unspecified fall, initial encounter: Secondary | ICD-10-CM | POA: Diagnosis not present

## 2020-12-14 DIAGNOSIS — A419 Sepsis, unspecified organism: Secondary | ICD-10-CM | POA: Diagnosis not present

## 2020-12-14 DIAGNOSIS — R918 Other nonspecific abnormal finding of lung field: Secondary | ICD-10-CM | POA: Diagnosis not present

## 2020-12-14 DIAGNOSIS — Z7901 Long term (current) use of anticoagulants: Secondary | ICD-10-CM | POA: Diagnosis not present

## 2020-12-14 LAB — CBC WITH DIFFERENTIAL/PLATELET
Abs Immature Granulocytes: 0 10*3/uL (ref 0.00–0.07)
Basophils Absolute: 0.2 10*3/uL — ABNORMAL HIGH (ref 0.0–0.1)
Basophils Relative: 1 %
Eosinophils Absolute: 0 10*3/uL (ref 0.0–0.5)
Eosinophils Relative: 0 %
HCT: 20.7 % — ABNORMAL LOW (ref 39.0–52.0)
Hemoglobin: 7 g/dL — ABNORMAL LOW (ref 13.0–17.0)
Lymphocytes Relative: 8 %
Lymphs Abs: 1.4 10*3/uL (ref 0.7–4.0)
MCH: 29.4 pg (ref 26.0–34.0)
MCHC: 33.8 g/dL (ref 30.0–36.0)
MCV: 87 fL (ref 80.0–100.0)
Monocytes Absolute: 1.2 10*3/uL — ABNORMAL HIGH (ref 0.1–1.0)
Monocytes Relative: 7 %
Neutro Abs: 14.6 10*3/uL — ABNORMAL HIGH (ref 1.7–7.7)
Neutrophils Relative %: 84 %
Platelets: 237 10*3/uL (ref 150–400)
RBC: 2.38 MIL/uL — ABNORMAL LOW (ref 4.22–5.81)
RDW: 18.4 % — ABNORMAL HIGH (ref 11.5–15.5)
WBC: 17.4 10*3/uL — ABNORMAL HIGH (ref 4.0–10.5)
nRBC: 0 /100 WBC
nRBC: 0.1 % (ref 0.0–0.2)

## 2020-12-14 MED ORDER — MELATONIN 3 MG PO TABS
3.0000 mg | ORAL_TABLET | Freq: Every day | ORAL | Status: DC
  Administered 2020-12-16: 3 mg via ORAL
  Filled 2020-12-14: qty 1

## 2020-12-14 MED ORDER — GERHARDT'S BUTT CREAM
TOPICAL_CREAM | Freq: Two times a day (BID) | CUTANEOUS | Status: DC
  Filled 2020-12-14: qty 1

## 2020-12-14 MED ORDER — QUETIAPINE FUMARATE 25 MG PO TABS: 25.0000 mg | ORAL_TABLET | Freq: Every evening | ORAL | Status: DC | PRN

## 2020-12-14 MED ORDER — ACETAMINOPHEN 325 MG PO TABS: 650.0000 mg | ORAL_TABLET | Freq: Four times a day (QID) | ORAL | Status: DC | PRN

## 2020-12-14 MED ORDER — ENOXAPARIN SODIUM 40 MG/0.4ML ~~LOC~~ SOLN
40.0000 mg | SUBCUTANEOUS | Status: AC
  Administered 2020-12-14: 40 mg via SUBCUTANEOUS
  Filled 2020-12-14: qty 0.4

## 2020-12-14 NOTE — Progress Notes (Signed)
STROKE TEAM PROGRESS NOTE   SUBJECTIVE (INTERVAL HISTORY) The son is at the bedside and thinks the patient has improved but still has difficulties with global weakness and gait instability.  The son reports the patient has had a downturn over the last week which resulted in the patient being taken for evaluation.   OBJECTIVE Temp:  [97.6 F (36.4 C)-100.3 F (37.9 C)] 98.8 F (37.1 C) (03/26 1112) Pulse Rate:  [94-109] 98 (03/26 1112) Cardiac Rhythm: Ventricular paced (03/26 0730) Resp:  [16-20] 16 (03/26 1112) BP: (102-139)/(48-69) 102/54 (03/26 1112) SpO2:  [92 %-98 %] 94 % (03/26 1112) Weight:  [89.8 kg] 89.8 kg (03/25 1939)  No results for input(s): GLUCAP in the last 168 hours. Recent Labs  Lab 12/09/20 1506 12/10/20 1131 12/12/20 1800 12/12/20 2017 12/13/20 0130  NA 133* 136 131* 133* 130*  K 4.2 3.8 4.0 4.0 4.1  CL 98 103 96* 95* 97*  CO2 22 27 26   --  25  GLUCOSE 105* 118* 132* 125* 110*  BUN 12 11 22  27* 23  CREATININE 0.97 0.90 1.39* 1.20 1.12  CALCIUM 7.7* 8.2* 8.5*  --  7.9*  MG  --   --   --   --  2.3   Recent Labs  Lab 12/12/20 1800 12/13/20 0130  AST 27 26  ALT 57* 47*  ALKPHOS 109 85  BILITOT 1.1 1.5*  PROT 8.5* 7.1  ALBUMIN 2.9* 2.4*   Recent Labs  Lab 12/10/20 1131 12/12/20 1800 12/12/20 2017 12/13/20 0136 12/14/20 0928  WBC 12.8* 22.7*  --  16.4* 17.4*  NEUTROABS  --  18.6*  --  13.0* 14.6*  HGB 7.9* 8.5* 7.8* 7.2* 7.0*  HCT 25.2* 27.2* 23.0* 22.7* 20.7*  MCV 91.3 91.6  --  90.4 87.0  PLT 273 277  --  188 237   No results for input(s): CKTOTAL, CKMB, CKMBINDEX, TROPONINI in the last 168 hours. Recent Labs    12/12/20 1913  LABPROT 19.6*  INR 1.7*   Recent Labs    12/12/20 2222  COLORURINE YELLOW  LABSPEC 1.029  PHURINE 5.0  GLUCOSEU NEGATIVE  HGBUR SMALL*  BILIRUBINUR NEGATIVE  KETONESUR NEGATIVE  PROTEINUR NEGATIVE  NITRITE NEGATIVE  LEUKOCYTESUR NEGATIVE       Component Value Date/Time   CHOL 115 12/13/2020 0130    CHOL 139 02/28/2020 1418   TRIG 81 12/13/2020 0130   HDL 31 (L) 12/13/2020 0130   HDL 53 02/28/2020 1418   CHOLHDL 3.7 12/13/2020 0130   VLDL 16 12/13/2020 0130   LDLCALC 68 12/13/2020 0130   LDLCALC 73 02/28/2020 1418   Lab Results  Component Value Date   HGBA1C 6.7 (H) 12/13/2020      Component Value Date/Time   LABOPIA NONE DETECTED 12/12/2020 2222   COCAINSCRNUR NONE DETECTED 12/12/2020 2222   LABBENZ NONE DETECTED 12/12/2020 2222   AMPHETMU NONE DETECTED 12/12/2020 2222   THCU NONE DETECTED 12/12/2020 2222   LABBARB NONE DETECTED 12/12/2020 2222    Recent Labs  Lab 12/12/20 1913  ETH <10    I have personally reviewed the radiological images below and agree with the radiology interpretations.  CT Angio Head W or Wo Contrast  Addendum Date: 12/13/2020   ADDENDUM REPORT: 12/13/2020 07:41 ADDENDUM: Finding omitted from the impression of the report: 2 mm vascular protrusion arising from the paraclinoid right ICA, which may reflect an aneurysm or infundibulum. These results will be called to the ordering clinician or representative by the Radiologist Assistant, and  communication documented in the PACS or [employer]. Electronically Signed   By: Kellie Simmering DO   On: 12/13/2020 07:41   Result Date: 12/13/2020 CLINICAL DATA:  Neuro deficit, acute, stroke suspected. EXAM: CT ANGIOGRAPHY HEAD AND NECK TECHNIQUE: Multidetector CT imaging of the head and neck was performed using the standard protocol during bolus administration of intravenous contrast. Multiplanar CT image reconstructions and MIPs were obtained to evaluate the vascular anatomy. Carotid stenosis measurements (when applicable) are obtained utilizing NASCET criteria, using the distal internal carotid diameter as the denominator. CONTRAST:  63mL OMNIPAQUE IOHEXOL 350 MG/ML SOLN COMPARISON:  No pertinent prior exams available for comparison. FINDINGS: CTA NECK FINDINGS Aortic arch: Standard aortic branching.  Atherosclerotic plaque within the visualized aortic arch and proximal major branch vessels of the neck. No hemodynamically significant innominate or proximal subclavian artery stenosis. Right carotid system: CCA and ICA patent within the neck. Calcified plaque within the CCA, carotid bifurcation and proximal ICA without hemodynamically significant stenosis (50% or greater). Left carotid system: CCA and ICA patent within the neck. Calcified plaque within the CCA, carotid bifurcation and proximal ICA without hemodynamically significant stenosis (50% or greater). Vertebral arteries: Codominant and patent within the neck without stenosis. Skeleton: No acute bony abnormality or aggressive osseous lesion. Other neck: No neck mass or cervical lymphadenopathy. Thyroid unremarkable. Upper chest: Areas of scarring within the imaged lung apices. No consolidation within the imaged lung apices. Review of the MIP images confirms the above findings CTA HEAD FINDINGS Anterior circulation: The intracranial internal carotid arteries are patent. Calcified plaque within both vessels with no more than mild stenosis. The M1 middle cerebral arteries are patent. No M2 proximal branch occlusion or high-grade proximal stenosis is identified. The anterior cerebral arteries are patent. 2 mm vascular protrusion arising from the paraclinoid right ICA which may reflect an aneurysm or infundibulum (series 9, image 94). Posterior circulation: The intracranial vertebral arteries are patent. Minimal nonstenotic calcified plaque within the V4 left vertebral artery. The basilar artery is patent. The posterior cerebral arteries are patent. Posterior communicating arteries are hypoplastic or absent bilaterally. Venous sinuses: Within the limitations of contrast timing, no convincing thrombus. Anatomic variants: As described Review of the MIP images confirms the above findings These results were called by telephone at the time of interpretation on  12/12/2020 at 8:04 pm to provider Dr. Rory Percy, who verbally acknowledged these results. IMPRESSION: CTA neck: 1. The bilateral common carotid and internal carotid arteries are patent within the neck without hemodynamically significant stenosis (50% or greater). Calcified plaque within both carotid systems within the neck. 2. Vertebral arteries patent within the neck bilaterally without stenosis. 3.  Aortic Atherosclerosis (ICD10-I70.0). CTA head: No intracranial large vessel occlusion or proximal high-grade arterial stenosis. Electronically Signed: By: Kellie Simmering DO On: 12/12/2020 20:25   CT HEAD WO CONTRAST  Result Date: 12/13/2020 CLINICAL DATA:  Stroke suspected. EXAM: CT HEAD WITHOUT CONTRAST TECHNIQUE: Contiguous axial images were obtained from the base of the skull through the vertex without intravenous contrast. COMPARISON:  Head CT December 12, 2020 FINDINGS: Brain: No evidence of acute large vascular territory infarction, hemorrhage, hydrocephalus, extra-axial collection or mass lesion/mass effect. Moderate global parenchymal volume loss with commensurate ex vacuo dilatation of the ventricular system. Similar mild burden of white matter chronic small vessel ischemic disease. Redemonstrated chronic lacunar infarct within the right lentiform nucleus. Vascular: No hyperdense vessel. Atherosclerotic calcifications of the skull base. Skull: Normal. Negative for fracture or focal lesion. Sinuses/Orbits: Mild mucosal thickening in the  right sphenoid sinus and scattered ethmoid air cells. Other: Streak artifact from dental hardware. IMPRESSION: 1. No evidence of acute intracranial abnormality. 2. Moderate global parenchymal volume loss and mild chronic small vessel ischemic disease. Redemonstrated chronic lacunar infarct within the right lentiform nucleus. Electronically Signed   By: Dahlia Bailiff MD   On: 12/13/2020 23:37   CT Head Wo Contrast  Result Date: 12/10/2020 CLINICAL DATA:  Fall from toilet.  Head  trauma. EXAM: CT HEAD WITHOUT CONTRAST TECHNIQUE: Contiguous axial images were obtained from the base of the skull through the vertex without intravenous contrast. COMPARISON:  None. FINDINGS: Brain: No evidence of acute infarction, hemorrhage, hydrocephalus, extra-axial collection or mass lesion/mass effect. Mild low-density changes within the periventricular and subcortical white matter compatible with chronic microvascular ischemic change. Mild diffuse cerebral volume loss. Vascular: Atherosclerotic calcifications involving the large vessels of the skull base. No unexpected hyperdense vessel. Skull: Normal. Negative for fracture or focal lesion. Sinuses/Orbits: No acute finding. Other: Negative for scalp hematoma. IMPRESSION: 1. No acute intracranial findings. 2. Mild chronic microvascular ischemic change and cerebral volume loss. Electronically Signed   By: Davina Poke D.O.   On: 12/10/2020 14:49   CT Angio Neck W and/or Wo Contrast  Addendum Date: 12/13/2020   ADDENDUM REPORT: 12/13/2020 07:41 ADDENDUM: Finding omitted from the impression of the report: 2 mm vascular protrusion arising from the paraclinoid right ICA, which may reflect an aneurysm or infundibulum. These results will be called to the ordering clinician or representative by the Radiologist Assistant, and communication documented in the PACS or [employer]. Electronically Signed   By: Kellie Simmering DO   On: 12/13/2020 07:41   Result Date: 12/13/2020 CLINICAL DATA:  Neuro deficit, acute, stroke suspected. EXAM: CT ANGIOGRAPHY HEAD AND NECK TECHNIQUE: Multidetector CT imaging of the head and neck was performed using the standard protocol during bolus administration of intravenous contrast. Multiplanar CT image reconstructions and MIPs were obtained to evaluate the vascular anatomy. Carotid stenosis measurements (when applicable) are obtained utilizing NASCET criteria, using the distal internal carotid diameter as the denominator.  CONTRAST:  49mL OMNIPAQUE IOHEXOL 350 MG/ML SOLN COMPARISON:  No pertinent prior exams available for comparison. FINDINGS: CTA NECK FINDINGS Aortic arch: Standard aortic branching. Atherosclerotic plaque within the visualized aortic arch and proximal major branch vessels of the neck. No hemodynamically significant innominate or proximal subclavian artery stenosis. Right carotid system: CCA and ICA patent within the neck. Calcified plaque within the CCA, carotid bifurcation and proximal ICA without hemodynamically significant stenosis (50% or greater). Left carotid system: CCA and ICA patent within the neck. Calcified plaque within the CCA, carotid bifurcation and proximal ICA without hemodynamically significant stenosis (50% or greater). Vertebral arteries: Codominant and patent within the neck without stenosis. Skeleton: No acute bony abnormality or aggressive osseous lesion. Other neck: No neck mass or cervical lymphadenopathy. Thyroid unremarkable. Upper chest: Areas of scarring within the imaged lung apices. No consolidation within the imaged lung apices. Review of the MIP images confirms the above findings CTA HEAD FINDINGS Anterior circulation: The intracranial internal carotid arteries are patent. Calcified plaque within both vessels with no more than mild stenosis. The M1 middle cerebral arteries are patent. No M2 proximal branch occlusion or high-grade proximal stenosis is identified. The anterior cerebral arteries are patent. 2 mm vascular protrusion arising from the paraclinoid right ICA which may reflect an aneurysm or infundibulum (series 9, image 94). Posterior circulation: The intracranial vertebral arteries are patent. Minimal nonstenotic calcified plaque within the V4  left vertebral artery. The basilar artery is patent. The posterior cerebral arteries are patent. Posterior communicating arteries are hypoplastic or absent bilaterally. Venous sinuses: Within the limitations of contrast timing, no  convincing thrombus. Anatomic variants: As described Review of the MIP images confirms the above findings These results were called by telephone at the time of interpretation on 12/12/2020 at 8:04 pm to provider Dr. Rory Percy, who verbally acknowledged these results. IMPRESSION: CTA neck: 1. The bilateral common carotid and internal carotid arteries are patent within the neck without hemodynamically significant stenosis (50% or greater). Calcified plaque within both carotid systems within the neck. 2. Vertebral arteries patent within the neck bilaterally without stenosis. 3.  Aortic Atherosclerosis (ICD10-I70.0). CTA head: No intracranial large vessel occlusion or proximal high-grade arterial stenosis. Electronically Signed: By: Kellie Simmering DO On: 12/12/2020 20:25   CT Chest W Contrast  Result Date: 12/10/2020 CLINICAL DATA:  Head trauma.  Fell off toilet. EXAM: CT CHEST, ABDOMEN, AND PELVIS WITH CONTRAST TECHNIQUE: Multidetector CT imaging of the chest, abdomen and pelvis was performed following the standard protocol during bolus administration of intravenous contrast. CONTRAST:  151mL OMNIPAQUE IOHEXOL 300 MG/ML  SOLN COMPARISON:  None FINDINGS: CT CHEST FINDINGS Cardiovascular: Heart size appears normal. There is a left chest wall pacer device with leads in the right atrial appendage and right ventricle. Aortic atherosclerosis. Coronary artery calcifications. Mediastinum/Nodes: Normal appearance of the thyroid gland. The trachea appears patent and is midline. Normal appearance of the esophagus. No enlarged axillary, supraclavicular, mediastinal or hilar lymph nodes. There is a cluster of prominent left paratracheal lymph nodes (none of which meet CT criteria for adenopathy) which measure up to 9 mm, image 26/3. Lungs/Pleura: Mild left posterior and lateral pleural thickening. Trace right pleural effusion. Low-attenuation, necrotic appearing lung mass within the central left lower lobe is identified. This measures  approximately 5.9 x 5.6 by 6.7 cm. Postobstructive pneumonitis is identified within the posterior and lateral left lung base. Enlarged left hilar lymph node is best seen on the coronal image and measures 1.4 cm. Subsegmental atelectasis is noted overlying the posterior right base. Calcified granuloma is noted within the posterior right upper lobe. Bilateral upper lobe bronchial wall thickening and mild bronchiectasis noted. Pleuroparenchymal scarring is seen within the left apex. Musculoskeletal: Acute left lateral eighth rib fracture identified. Multiple subacute to chronic right lateral rib fracture deformities are also identified, image 110/4 and image 124/4. CT ABDOMEN PELVIS FINDINGS Hepatobiliary: No hepatic injury or perihepatic hematoma. Two calcified stones noted within the gallbladder measuring up to 1.4 cm. No signs of gallbladder wall thickening or pericholecystic fluid. Mild increase caliber of the CBD measures up to 1 cm. No signs of choledocholithiasis or obstructing mass. Pancreas: Unremarkable. No pancreatic ductal dilatation or surrounding inflammatory changes. Spleen: No splenic injury or perisplenic hematoma. Adrenals/Urinary Tract: No adrenal hemorrhage or renal injury identified. Bladder is unremarkable. Stomach/Bowel: Stomach is within normal limits. Appendix appears normal. No evidence of bowel wall thickening, distention, or inflammatory changes. Multiple colonic diverticula identified without acute inflammation. Vascular/Lymphatic: Aortic atherosclerosis. No aneurysm. No abdominopelvic adenopathy identified. Reproductive: Prostate gland appears enlarged. Other: Trace low-attenuation fluid noted within the left posterior pelvis. Large fat containing right inguinal hernia. Musculoskeletal: Previous right hip arthroplasty. The scoliosis and mild degenerative disc disease identified within the lumbar spine. IMPRESSION: 1. Large, necrotic appearing lung mass within the central left lower lobe is  identified concerning for primary bronchogenic carcinoma. Signs of postobstructive pneumonitis is noted within the posterior and lateral left lung  base. Differential considerations include necrotizing might 2. Enlarged left hilar lymph node and prominent cluster of left paratracheal lymph nodes worrisome for metastatic adenopathy versus reactive adenopathy. 3. Acute left lateral eighth rib fracture. 4. Multiple subacute to chronic right lateral rib fracture deformities are also identified. 5. Gallstones. 6. Large fat containing right inguinal hernia. 7. Trace low-attenuation fluid noted within the left posterior pelvis. 8. Aortic atherosclerosis. Aortic Atherosclerosis (ICD10-I70.0). These results were called by telephone at the time of interpretation on 12/10/2020 at 3:06 pm to provider [facility] , who verbally acknowledged these results. Electronically Signed   By: Kerby Moors M.D.   On: 12/10/2020 15:06   CT ABDOMEN PELVIS W CONTRAST  Result Date: 12/10/2020 CLINICAL DATA:  Head trauma.  Fell off toilet. EXAM: CT CHEST, ABDOMEN, AND PELVIS WITH CONTRAST TECHNIQUE: Multidetector CT imaging of the chest, abdomen and pelvis was performed following the standard protocol during bolus administration of intravenous contrast. CONTRAST:  160mL OMNIPAQUE IOHEXOL 300 MG/ML  SOLN COMPARISON:  None FINDINGS: CT CHEST FINDINGS Cardiovascular: Heart size appears normal. There is a left chest wall pacer device with leads in the right atrial appendage and right ventricle. Aortic atherosclerosis. Coronary artery calcifications. Mediastinum/Nodes: Normal appearance of the thyroid gland. The trachea appears patent and is midline. Normal appearance of the esophagus. No enlarged axillary, supraclavicular, mediastinal or hilar lymph nodes. There is a cluster of prominent left paratracheal lymph nodes (none of which meet CT criteria for adenopathy) which measure up to 9 mm, image 26/3. Lungs/Pleura: Mild left posterior and  lateral pleural thickening. Trace right pleural effusion. Low-attenuation, necrotic appearing lung mass within the central left lower lobe is identified. This measures approximately 5.9 x 5.6 by 6.7 cm. Postobstructive pneumonitis is identified within the posterior and lateral left lung base. Enlarged left hilar lymph node is best seen on the coronal image and measures 1.4 cm. Subsegmental atelectasis is noted overlying the posterior right base. Calcified granuloma is noted within the posterior right upper lobe. Bilateral upper lobe bronchial wall thickening and mild bronchiectasis noted. Pleuroparenchymal scarring is seen within the left apex. Musculoskeletal: Acute left lateral eighth rib fracture identified. Multiple subacute to chronic right lateral rib fracture deformities are also identified, image 110/4 and image 124/4. CT ABDOMEN PELVIS FINDINGS Hepatobiliary: No hepatic injury or perihepatic hematoma. Two calcified stones noted within the gallbladder measuring up to 1.4 cm. No signs of gallbladder wall thickening or pericholecystic fluid. Mild increase caliber of the CBD measures up to 1 cm. No signs of choledocholithiasis or obstructing mass. Pancreas: Unremarkable. No pancreatic ductal dilatation or surrounding inflammatory changes. Spleen: No splenic injury or perisplenic hematoma. Adrenals/Urinary Tract: No adrenal hemorrhage or renal injury identified. Bladder is unremarkable. Stomach/Bowel: Stomach is within normal limits. Appendix appears normal. No evidence of bowel wall thickening, distention, or inflammatory changes. Multiple colonic diverticula identified without acute inflammation. Vascular/Lymphatic: Aortic atherosclerosis. No aneurysm. No abdominopelvic adenopathy identified. Reproductive: Prostate gland appears enlarged. Other: Trace low-attenuation fluid noted within the left posterior pelvis. Large fat containing right inguinal hernia. Musculoskeletal: Previous right hip arthroplasty. The  scoliosis and mild degenerative disc disease identified within the lumbar spine. IMPRESSION: 1. Large, necrotic appearing lung mass within the central left lower lobe is identified concerning for primary bronchogenic carcinoma. Signs of postobstructive pneumonitis is noted within the posterior and lateral left lung base. Differential considerations include necrotizing might 2. Enlarged left hilar lymph node and prominent cluster of left paratracheal lymph nodes worrisome for metastatic adenopathy versus reactive adenopathy. 3. Acute  left lateral eighth rib fracture. 4. Multiple subacute to chronic right lateral rib fracture deformities are also identified. 5. Gallstones. 6. Large fat containing right inguinal hernia. 7. Trace low-attenuation fluid noted within the left posterior pelvis. 8. Aortic atherosclerosis. Aortic Atherosclerosis (ICD10-I70.0). These results were called by telephone at the time of interpretation on 12/10/2020 at 3:06 pm to provider [facility] , who verbally acknowledged these results. Electronically Signed   By: Kerby Moors M.D.   On: 12/10/2020 15:06   DG Chest Port 1 View  Result Date: 12/12/2020 CLINICAL DATA:  Cough, weakness EXAM: PORTABLE CHEST 1 VIEW COMPARISON:  12/10/2020 FINDINGS: Left pacer remains in place, unchanged. Cardiomegaly, vascular congestion. Left base opacity again noted, improved since prior study. No confluent opacity on the right. No visible effusions. IMPRESSION: Cardiomegaly, vascular congestion. Left base atelectasis or infiltrate, improving since prior study. Electronically Signed   By: Rolm Baptise M.D.   On: 12/12/2020 23:08   DG Chest Portable 1 View  Result Date: 12/10/2020 CLINICAL DATA:  [place] today. EXAM: PORTABLE CHEST 1 VIEW COMPARISON:  Chest x-ray 09/16/2020 FINDINGS: The heart is borderline enlarged. Stable tortuosity and calcification of the thoracic aorta. The pacer wires are in good position, unchanged. Left basilar opacity could be a  combination of atelectasis, infiltrate and effusion. The right lung is grossly clear. No pneumothorax identified. The bony thorax is intact.  No obvious acute fractures. IMPRESSION: Left basilar opacity likely a combination of atelectasis and effusion. Grossly intact bony thorax. Electronically Signed   By: Marijo Sanes M.D.   On: 12/10/2020 11:56   DG Shoulder Left Port  Result Date: 12/13/2020 CLINICAL DATA:  Weakness, shoulder pain EXAM: LEFT SHOULDER COMPARISON:  None FINDINGS: Osseous demineralization. Degenerative changes LEFT AC joint with joint space narrowing and spur formation. Additional degenerative changes at LEFT glenohumeral joint. Approximation of LEFT humeral head with undersurface of acromion, raising question of rotator cuff tear. No acute fracture, dislocation, or bone destruction. Atherosclerotic calcification aorta and pacemaker leads noted. Question LEFT pleural effusion. IMPRESSION: Degenerative changes of LEFT acromioclavicular and glenohumeral joints. Approximation of humeral head with undersurface of acromion raising question of rotator cuff tear. Electronically Signed   By: Lavonia Dana M.D.   On: 12/13/2020 18:24   ECHOCARDIOGRAM COMPLETE BUBBLE STUDY  Result Date: 12/13/2020    ECHOCARDIOGRAM REPORT   Patient Name:   RAYLEN TANGONAN Date of Exam: 12/13/2020 Medical Rec #:  [id number]          Height:       71.0 in Accession #:    [id number]         Weight:       200.0 lb Date of Birth:  1931/05/04          BSA:          2.108 m Patient Age:    25 years           BP:           131/70 mmHg Patient Gender: M                  HR:           94 bpm. Exam Location:  Inpatient Procedure: 2D Echo, Cardiac Doppler, Color Doppler and Intracardiac            Opacification Agent Indications:    TIA (transient ischemic attack) 435.9 / G45.9  History:        Patient has prior history of Echocardiogram  examinations, most                 recent 05/29/2020. Arrythmias:Atrial Fibrillation; Risk                  Factors:Former Smoker.  Sonographer:    Vickie Epley RDCS Referring Phys: [id number] [place]  1. Left ventricular ejection fraction, by estimation, is 60 to 65%. The left ventricle has normal function. The left ventricle has no regional wall motion abnormalities. Indeterminate diastolic filling due to E-A fusion.  2. Right ventricular systolic function is normal. The right ventricular size is normal.  3. The mitral valve is normal in structure. No evidence of mitral valve regurgitation. No evidence of mitral stenosis.  4. The aortic valve is normal in structure. There is mild calcification of the aortic valve. Aortic valve regurgitation is not visualized. Mild to moderate aortic valve sclerosis/calcification is present, without any evidence of aortic stenosis.  5. Aortic dilatation noted. There is borderline dilatation of the aortic root, measuring 38 mm.  6. The inferior vena cava is normal in size with greater than 50% respiratory variability, suggesting right atrial pressure of 3 mmHg.  7. Agitated saline contrast bubble study was negative, with no evidence of any interatrial shunt. Comparison(s): No significant change from prior study. Prior images reviewed side by side. FINDINGS  Left Ventricle: Left ventricular ejection fraction, by estimation, is 60 to 65%. The left ventricle has normal function. The left ventricle has no regional wall motion abnormalities. Definity contrast agent was given IV to delineate the left ventricular  endocardial borders. The left ventricular internal cavity size was normal in size. There is no left ventricular hypertrophy. Indeterminate diastolic filling due to E-A fusion. Right Ventricle: The right ventricular size is normal. No increase in right ventricular wall thickness. Right ventricular systolic function is normal. Left Atrium: Left atrial size was normal in size. Right Atrium: Right atrial size was normal in size. Pericardium: There is no  evidence of pericardial effusion. Mitral Valve: The mitral valve is normal in structure. There is mild thickening of the mitral valve leaflet(s). Mild mitral annular calcification. No evidence of mitral valve regurgitation. No evidence of mitral valve stenosis. Tricuspid Valve: The tricuspid valve is normal in structure. Tricuspid valve regurgitation is not demonstrated. No evidence of tricuspid stenosis. Aortic Valve: The aortic valve is normal in structure. There is mild calcification of the aortic valve. Aortic valve regurgitation is not visualized. Mild to moderate aortic valve sclerosis/calcification is present, without any evidence of aortic stenosis. Pulmonic Valve: The pulmonic valve was normal in structure. Pulmonic valve regurgitation is not visualized. No evidence of pulmonic stenosis. Aorta: Aortic dilatation noted. There is borderline dilatation of the aortic root, measuring 38 mm. Venous: The inferior vena cava is normal in size with greater than 50% respiratory variability, suggesting right atrial pressure of 3 mmHg. IAS/Shunts: No atrial level shunt detected by color flow Doppler. Agitated saline contrast was given intravenously to evaluate for intracardiac shunting. Agitated saline contrast bubble study was negative, with no evidence of any interatrial shunt.  LEFT VENTRICLE PLAX 2D LVIDd:         5.00 cm      Diastology LVIDs:         3.60 cm      LV e' medial:    5.92 cm/s LV PW:         0.90 cm      LV E/e' medial:  13.6 LV IVS:  0.90 cm      LV e' lateral:   6.97 cm/s LVOT diam:     2.40 cm      LV E/e' lateral: 11.5 LV SV:         80 LV SV Index:   38 LVOT Area:     4.52 cm  LV Volumes (MOD) LV vol d, MOD A2C: 121.0 ml LV vol d, MOD A4C: 148.0 ml LV vol s, MOD A2C: 59.5 ml LV vol s, MOD A4C: 72.2 ml LV SV MOD A2C:     61.5 ml LV SV MOD A4C:     148.0 ml LV SV MOD BP:      67.5 ml RIGHT VENTRICLE RV S prime:     12.70 cm/s TAPSE (M-mode): 2.2 cm LEFT ATRIUM           Index       RIGHT  ATRIUM           Index LA diam:      3.60 cm 1.71 cm/m  RA Area:     13.90 cm LA Vol (A2C): 31.6 ml 14.99 ml/m RA Volume:   27.20 ml  12.90 ml/m LA Vol (A4C): 40.8 ml 19.35 ml/m  AORTIC VALVE LVOT Vmax:   77.60 cm/s LVOT Vmean:  57.600 cm/s LVOT VTI:    0.176 m  AORTA Ao Root diam: 3.80 cm Ao Asc diam:  3.80 cm MITRAL VALVE MV Area (PHT): 3.17 cm    SHUNTS MV Decel Time: 239 msec    Systemic VTI:  0.18 m MV E velocity: 80.50 cm/s  Systemic Diam: 2.40 cm MV A velocity: 59.20 cm/s MV E/A ratio:  1.36 Mihai Croitoru MD Electronically signed by Sanda Klein MD Signature Date/Time: 12/13/2020/12:44:09 PM    Final    CT HEAD CODE STROKE WO CONTRAST`  Result Date: 12/12/2020 CLINICAL DATA:  Code stroke. Additional history provided: Neuro deficit, acute, stroke suspected. Additional history provided: Last known well on known, left-sided weakness/neglect. EXAM: CT HEAD WITHOUT CONTRAST TECHNIQUE: Contiguous axial images were obtained from the base of the skull through the vertex without intravenous contrast. COMPARISON:  Prior head CT 12/10/2020. FINDINGS: Brain: Streak and beam hardening artifact arising from dental restoration partially obscures the occipital lobes and significantly obscures the posterior fossa. Moderate cerebral atrophy. Comparatively mild cerebellar atrophy. Commensurate prominence of the ventricles and sulci. Mild patchy and ill-defined hypoattenuation within the cerebral white matter is nonspecific, but compatible with chronic small vessel ischemic disease. Redemonstrated chronic lacunar infarct within the right lentiform nucleus. Within the limitations of motion degradation, no acute intracranial hemorrhage or acute demarcated cortical infarct is identified. No extra-axial fluid collection. No evidence of intracranial mass. No midline shift. Vascular: No appreciable hyperdense vessel. Atherosclerotic calcifications. Skull: Normal. Negative for fracture or focal lesion. Sinuses/Orbits:  Visualized orbits show no acute finding. Mild mucosal thickening within the right sphenoid sinus. ASPECTS ([place] Stroke Program Early CT Score) - Ganglionic level infarction (caudate, lentiform nuclei, internal capsule, insula, M1-M3 cortex): 7 - Supraganglionic infarction (M4-M6 cortex): 3 Total score (0-10 with 10 being normal): 10 These results were called by telephone at the time of interpretation on 12/12/2020 at 8:04 pm to provider [facility] , who verbally acknowledged these results. IMPRESSION: Streak and beam hardening artifact arising from dental restoration partially obscures the occipital lobes and significantly obscures the posterior fossa. Within this limitation, there is no evidence of acute intracranial abnormality. ASPECTS is 10. Redemonstrated chronic right basal ganglia lacunar infarct. Moderate cerebral atrophy  with mild cerebral white matter chronic small vessel ischemic disease. Comparatively mild cerebellar atrophy. Electronically Signed   By: Kellie Simmering DO   On: 12/12/2020 20:05    PHYSICAL EXAM  Temp:  [97.6 F (36.4 C)-100.3 F (37.9 C)] 98.8 F (37.1 C) (03/26 1112) Pulse Rate:  [94-109] 98 (03/26 1112) Resp:  [16-20] 16 (03/26 1112) BP: (102-139)/(48-69) 102/54 (03/26 1112) SpO2:  [92 %-98 %] 94 % (03/26 1112) Weight:  [89.8 kg] 89.8 kg (03/25 1939)  General - Well nourished, well developed, mildly agitated and trying to get out of bed.  Ophthalmologic - fundi not visualized due to noncooperation.  Cardiovascular - irregularly irregular heart rate and rhythm.  Neuro - awake, alert, eyes open, mildly agitated trying to get out of bed; there is mild psychomotor slowing.  Hearing is severely impaired but he does follow appendicular commands with prompting. Orientated to self. Not cooperative with naming or repetition. No gaze palsy, tracking bilaterally, blinking to visual threat bilaterally, PERRL. No facial droop. Tongue midline.  Bilateral LEs 3/5 proximal and  distal, symmetrical strength. Left UE proximal deltoid 2/5 but bicep and finger grip 4/5, RUE 4/5 proximal and distal.  Sensation and coordination not corporative, gait not tested.   ASSESSMENT/PLAN Mr. ELJAY LAVE is a 87 y.o. male with history of atrial fibrillation on Eliquis, anemia, myelodysplastic syndrome, pacemaker placement many years ago not compatible with MRI admitted for evaluation of recurrent falls due to weakness. No tPA given due to outside window.    ? Stroke vs. Generalized weakness with left shoulder weakness due to injury.  It is possible patient may have had a small stroke not seen on CT.  Resultant left shoulder weakness - ? Should injury with fall  MRI not able to perform due to pacemaker  CT head no acute abnormality  CTA head and neck unremarkable  CT repeat no acute findings noted.  Left shoulder x-ray   2D Echo EF 60 to 65%  LDL 68  HgbA1c 6.7  SCDs for VTE prophylaxis  Eliquis (apixaban) daily prior to admission, now on ASA 81mg . Eliquis on hold now for bronchoscopy Monday. If repeat CT no large stroke okay to resume Eliquis after bronchoscopy  Patient counseled to be compliant with his antithrombotic medications  Ongoing aggressive stroke risk factor management  Therapy recommendations:  SNF  Disposition:  Pending  We will sign off reconsult as needed.  Recent falls  Recurrent falls in 2 days  Causing left rib fracture and ?  Left shoulder injury  Etiology not quite clear, concerning for gait disorder in the setting of cognitive impairment, recommend outpatient neurology follow-up  Lung cancer   Found to have LLL necrotic mass, likely a primary lung cancer   Pulmonary consulted  Plan to have bronchoscopy on Monday  Hold off eliquis for now  Persistent A. Fib  On Eliquis PTA  Now on hold pending bronchoscopy Monday   If repeat CT no large stroke okay to resume Eliquis after bronchoscopy   Hyperlipidemia  Home  meds: Zocor 20  LDL 68, goal < 70  Now on Zocor 20  Continue statin at discharge  Other Stroke Risk Factors  Advanced age  Other Active Problems  Pacemaker, not compatible with MRI  myelodysplastic syndrome  Cognitive impairment, recommend outpatient neurology follow-up  Hospital day # 1      To contact Stroke Continuity provider, please refer to [email or web address] After hours, contact General Neurology

## 2020-12-14 NOTE — Progress Notes (Signed)
[facility] PROGRESS NOTE    Progress Note  Ricky [name]  IOX:[id number] DOB: Jan 09, 1931 DOA: 12/12/2020 PCP: Roetta Sessions, NP     Brief Narrative:   Ricky [name] is an 87 y.o. male past medical history of Miller dysplastic syndrome, paroxysmal atrial fibrillation on Eliquis systolic heart failure Boerhaave syndrome presented to the ED for recurrent falls, he was recently seen in the ED with rib fractures for mechanical fall, incidentally was also found to have a large necrotic appearing lung mass in the left lower lobe concerning for bronchogenic carcinoma, son and patient denies any loss of consciousness no loss of sphincter control in the ED they noticed left arm weakness and a facial droop,    Significant studies: 12/13/2020 CT of the head streak and beam hardening artifact arising from the dental restoration partially obscured occipital lobe and obscured posterior fossa, chronic right basal ganglia lacunar infarct 12/13/2020 CT angio of the neck showed 2 mm vascular protrusion arising for the paraclinoid right ICA, bilateral caloric artery without significant stenosis. 12/13/2020 2 CT angio of the head no intracranial large vessel occlusions  Antibiotics: 12/12/2020 Rocephin and azithromycin  Microbiology data: Blood culture:  Procedures: None  Assessment/Plan:   Acute metabolic encephalopathy: The most likely etiology likely due to infectious process. He was started empirically on antibiotics and IV fluids. Neurology was consulted recommended stroke work-up initial CT was negative, repeated CT of the head no findings, moderate global parenchymal volume loss and mild chronic small vessel ischemic changes. Left shoulder x-ray showed no acute findings. With a new left lower lobe infiltrate on chest x-ray today mild leukocytosis tachycardic encephalopathic. Blood cultures obtained.  Sepsis due to postobstructive pneumonia: Started empirically on  Rocephin and azithromycin has remained afebrile, mildly tachycardic respiratory rate is 16 satting greater than 95% on 2 L. Blood cultures remain negative till date. Neurology recommended to resume Eliquis after bronchoscopy. Check a CBC today and tomorrow morning.  Necrotic mass of the left lower lobe: Incidentally diagnosed on previous visits to the ED. Contact pulmonary for bronchoscopy on 09/17/2021. Hold Eliquis till then.  Fall at home, initial encounter There was a report of left upper extremity weakness and facial droop concern by the ED MRI cannot be performed due to pacemaker neurology was consulted recommended admission and monitor with serial neurological examination and TIA work-up. Eliquis held for biopsy now on aspirin for bronchoscopy on Monday. CT scan of the head no acute CVA. Evaluated the patient recommended skilled nursing facility.  Paroxysmal atrial fibrillation: Currently rate controlled in sinus rhythm. Hold Eliquis for bronchoscopy.  Myelodysplastic syndrome: Follow-up with Dr. Lindi Adie as an outpatient.  Coronary artery disease involving the native arteries: Currently chest pain-free continue current home regimen.  Chronic diastolic heart failure: Slightly hypervolemic on admission. Diuretic therapy was held he was started on IV fluid hydration     DVT prophylaxis: Lovenox Family Communication: Son Status is: Observation  The patient remains OBS appropriate and will d/c before 2 midnights.  Dispo: The patient is from: Home              Anticipated d/c is to: SNF              Patient currently is not medically stable to d/c.   Difficult to place patient No   Code Status:     Code Status Orders  (From admission, onward)         Start     Ordered   12/13/20 0136  Full code  Continuous        12/13/20 0135        Code Status History    Date Active Date Inactive Code Status Order ID Comments User Context   11/22/2020 2354 11/23/2020 2326  Full Code [id number]  Elwyn Reach, MD ED   12/26/2019 1844 12/27/2019 2316 Full Code [id number]  Alma Friendly, MD ED   Advance Care Planning Activity        IV Access:    Peripheral IV   Procedures and diagnostic studies:   CT Angio Head W or Wo Contrast  Addendum Date: 12/13/2020   ADDENDUM REPORT: 12/13/2020 07:41 ADDENDUM: Finding omitted from the impression of the report: 2 mm vascular protrusion arising from the paraclinoid right ICA, which may reflect an aneurysm or infundibulum. These results will be called to the ordering clinician or representative by the Radiologist Assistant, and communication documented in the PACS or [employer]. Electronically Signed   By: Kellie Simmering DO   On: 12/13/2020 07:41   Result Date: 12/13/2020 CLINICAL DATA:  Neuro deficit, acute, stroke suspected. EXAM: CT ANGIOGRAPHY HEAD AND NECK TECHNIQUE: Multidetector CT imaging of the head and neck was performed using the standard protocol during bolus administration of intravenous contrast. Multiplanar CT image reconstructions and MIPs were obtained to evaluate the vascular anatomy. Carotid stenosis measurements (when applicable) are obtained utilizing NASCET criteria, using the distal internal carotid diameter as the denominator. CONTRAST:  79mL OMNIPAQUE IOHEXOL 350 MG/ML SOLN COMPARISON:  No pertinent prior exams available for comparison. FINDINGS: CTA NECK FINDINGS Aortic arch: Standard aortic branching. Atherosclerotic plaque within the visualized aortic arch and proximal major branch vessels of the neck. No hemodynamically significant innominate or proximal subclavian artery stenosis. Right carotid system: CCA and ICA patent within the neck. Calcified plaque within the CCA, carotid bifurcation and proximal ICA without hemodynamically significant stenosis (50% or greater). Left carotid system: CCA and ICA patent within the neck. Calcified plaque within the CCA, carotid bifurcation and proximal ICA  without hemodynamically significant stenosis (50% or greater). Vertebral arteries: Codominant and patent within the neck without stenosis. Skeleton: No acute bony abnormality or aggressive osseous lesion. Other neck: No neck mass or cervical lymphadenopathy. Thyroid unremarkable. Upper chest: Areas of scarring within the imaged lung apices. No consolidation within the imaged lung apices. Review of the MIP images confirms the above findings CTA HEAD FINDINGS Anterior circulation: The intracranial internal carotid arteries are patent. Calcified plaque within both vessels with no more than mild stenosis. The M1 middle cerebral arteries are patent. No M2 proximal branch occlusion or high-grade proximal stenosis is identified. The anterior cerebral arteries are patent. 2 mm vascular protrusion arising from the paraclinoid right ICA which may reflect an aneurysm or infundibulum (series 9, image 94). Posterior circulation: The intracranial vertebral arteries are patent. Minimal nonstenotic calcified plaque within the V4 left vertebral artery. The basilar artery is patent. The posterior cerebral arteries are patent. Posterior communicating arteries are hypoplastic or absent bilaterally. Venous sinuses: Within the limitations of contrast timing, no convincing thrombus. Anatomic variants: As described Review of the MIP images confirms the above findings These results were called by telephone at the time of interpretation on 12/12/2020 at 8:04 pm to provider Dr. Rory Percy, who verbally acknowledged these results. IMPRESSION: CTA neck: 1. The bilateral common carotid and internal carotid arteries are patent within the neck without hemodynamically significant stenosis (50% or greater). Calcified plaque within both carotid systems within the neck. 2. Vertebral arteries patent  within the neck bilaterally without stenosis. 3.  Aortic Atherosclerosis (ICD10-I70.0). CTA head: No intracranial large vessel occlusion or proximal high-grade  arterial stenosis. Electronically Signed: By: Kellie Simmering DO On: 12/12/2020 20:25   CT HEAD WO CONTRAST  Result Date: 12/13/2020 CLINICAL DATA:  Stroke suspected. EXAM: CT HEAD WITHOUT CONTRAST TECHNIQUE: Contiguous axial images were obtained from the base of the skull through the vertex without intravenous contrast. COMPARISON:  Head CT December 12, 2020 FINDINGS: Brain: No evidence of acute large vascular territory infarction, hemorrhage, hydrocephalus, extra-axial collection or mass lesion/mass effect. Moderate global parenchymal volume loss with commensurate ex vacuo dilatation of the ventricular system. Similar mild burden of white matter chronic small vessel ischemic disease. Redemonstrated chronic lacunar infarct within the right lentiform nucleus. Vascular: No hyperdense vessel. Atherosclerotic calcifications of the skull base. Skull: Normal. Negative for fracture or focal lesion. Sinuses/Orbits: Mild mucosal thickening in the right sphenoid sinus and scattered ethmoid air cells. Other: Streak artifact from dental hardware. IMPRESSION: 1. No evidence of acute intracranial abnormality. 2. Moderate global parenchymal volume loss and mild chronic small vessel ischemic disease. Redemonstrated chronic lacunar infarct within the right lentiform nucleus. Electronically Signed   By: Dahlia Bailiff MD   On: 12/13/2020 23:37   CT Angio Neck W and/or Wo Contrast  Addendum Date: 12/13/2020   ADDENDUM REPORT: 12/13/2020 07:41 ADDENDUM: Finding omitted from the impression of the report: 2 mm vascular protrusion arising from the paraclinoid right ICA, which may reflect an aneurysm or infundibulum. These results will be called to the ordering clinician or representative by the Radiologist Assistant, and communication documented in the PACS or [employer]. Electronically Signed   By: Kellie Simmering DO   On: 12/13/2020 07:41   Result Date: 12/13/2020 CLINICAL DATA:  Neuro deficit, acute, stroke suspected. EXAM: CT  ANGIOGRAPHY HEAD AND NECK TECHNIQUE: Multidetector CT imaging of the head and neck was performed using the standard protocol during bolus administration of intravenous contrast. Multiplanar CT image reconstructions and MIPs were obtained to evaluate the vascular anatomy. Carotid stenosis measurements (when applicable) are obtained utilizing NASCET criteria, using the distal internal carotid diameter as the denominator. CONTRAST:  71mL OMNIPAQUE IOHEXOL 350 MG/ML SOLN COMPARISON:  No pertinent prior exams available for comparison. FINDINGS: CTA NECK FINDINGS Aortic arch: Standard aortic branching. Atherosclerotic plaque within the visualized aortic arch and proximal major branch vessels of the neck. No hemodynamically significant innominate or proximal subclavian artery stenosis. Right carotid system: CCA and ICA patent within the neck. Calcified plaque within the CCA, carotid bifurcation and proximal ICA without hemodynamically significant stenosis (50% or greater). Left carotid system: CCA and ICA patent within the neck. Calcified plaque within the CCA, carotid bifurcation and proximal ICA without hemodynamically significant stenosis (50% or greater). Vertebral arteries: Codominant and patent within the neck without stenosis. Skeleton: No acute bony abnormality or aggressive osseous lesion. Other neck: No neck mass or cervical lymphadenopathy. Thyroid unremarkable. Upper chest: Areas of scarring within the imaged lung apices. No consolidation within the imaged lung apices. Review of the MIP images confirms the above findings CTA HEAD FINDINGS Anterior circulation: The intracranial internal carotid arteries are patent. Calcified plaque within both vessels with no more than mild stenosis. The M1 middle cerebral arteries are patent. No M2 proximal branch occlusion or high-grade proximal stenosis is identified. The anterior cerebral arteries are patent. 2 mm vascular protrusion arising from the paraclinoid right ICA  which may reflect an aneurysm or infundibulum (series 9, image 94). Posterior circulation: The  intracranial vertebral arteries are patent. Minimal nonstenotic calcified plaque within the V4 left vertebral artery. The basilar artery is patent. The posterior cerebral arteries are patent. Posterior communicating arteries are hypoplastic or absent bilaterally. Venous sinuses: Within the limitations of contrast timing, no convincing thrombus. Anatomic variants: As described Review of the MIP images confirms the above findings These results were called by telephone at the time of interpretation on 12/12/2020 at 8:04 pm to provider Dr. Rory Percy, who verbally acknowledged these results. IMPRESSION: CTA neck: 1. The bilateral common carotid and internal carotid arteries are patent within the neck without hemodynamically significant stenosis (50% or greater). Calcified plaque within both carotid systems within the neck. 2. Vertebral arteries patent within the neck bilaterally without stenosis. 3.  Aortic Atherosclerosis (ICD10-I70.0). CTA head: No intracranial large vessel occlusion or proximal high-grade arterial stenosis. Electronically Signed: By: Kellie Simmering DO On: 12/12/2020 20:25   DG Chest Port 1 View  Result Date: 12/12/2020 CLINICAL DATA:  Cough, weakness EXAM: PORTABLE CHEST 1 VIEW COMPARISON:  12/10/2020 FINDINGS: Left pacer remains in place, unchanged. Cardiomegaly, vascular congestion. Left base opacity again noted, improved since prior study. No confluent opacity on the right. No visible effusions. IMPRESSION: Cardiomegaly, vascular congestion. Left base atelectasis or infiltrate, improving since prior study. Electronically Signed   By: Rolm Baptise M.D.   On: 12/12/2020 23:08   DG Shoulder Left Port  Result Date: 12/13/2020 CLINICAL DATA:  Weakness, shoulder pain EXAM: LEFT SHOULDER COMPARISON:  None FINDINGS: Osseous demineralization. Degenerative changes LEFT AC joint with joint space narrowing and spur  formation. Additional degenerative changes at LEFT glenohumeral joint. Approximation of LEFT humeral head with undersurface of acromion, raising question of rotator cuff tear. No acute fracture, dislocation, or bone destruction. Atherosclerotic calcification aorta and pacemaker leads noted. Question LEFT pleural effusion. IMPRESSION: Degenerative changes of LEFT acromioclavicular and glenohumeral joints. Approximation of humeral head with undersurface of acromion raising question of rotator cuff tear. Electronically Signed   By: Lavonia Dana M.D.   On: 12/13/2020 18:24   ECHOCARDIOGRAM COMPLETE BUBBLE STUDY  Result Date: 12/13/2020    ECHOCARDIOGRAM REPORT   Patient Name:   TOWNSEND CUDWORTH Date of Exam: 12/13/2020 Medical Rec #:  [id number]          Height:       71.0 in Accession #:    [id number]         Weight:       200.0 lb Date of Birth:  Jan 30, 1931          BSA:          2.108 m Patient Age:    69 years           BP:           131/70 mmHg Patient Gender: M                  HR:           94 bpm. Exam Location:  Inpatient Procedure: 2D Echo, Cardiac Doppler, Color Doppler and Intracardiac            Opacification Agent Indications:    TIA (transient ischemic attack) 435.9 / G45.9  History:        Patient has prior history of Echocardiogram examinations, most                 recent 05/29/2020. Arrythmias:Atrial Fibrillation; Risk  Factors:Former Smoker.  Sonographer:    Vickie Epley RDCS Referring Phys: [id number] [place]  1. Left ventricular ejection fraction, by estimation, is 60 to 65%. The left ventricle has normal function. The left ventricle has no regional wall motion abnormalities. Indeterminate diastolic filling due to E-A fusion.  2. Right ventricular systolic function is normal. The right ventricular size is normal.  3. The mitral valve is normal in structure. No evidence of mitral valve regurgitation. No evidence of mitral stenosis.  4. The aortic valve is normal in  structure. There is mild calcification of the aortic valve. Aortic valve regurgitation is not visualized. Mild to moderate aortic valve sclerosis/calcification is present, without any evidence of aortic stenosis.  5. Aortic dilatation noted. There is borderline dilatation of the aortic root, measuring 38 mm.  6. The inferior vena cava is normal in size with greater than 50% respiratory variability, suggesting right atrial pressure of 3 mmHg.  7. Agitated saline contrast bubble study was negative, with no evidence of any interatrial shunt. Comparison(s): No significant change from prior study. Prior images reviewed side by side. FINDINGS  Left Ventricle: Left ventricular ejection fraction, by estimation, is 60 to 65%. The left ventricle has normal function. The left ventricle has no regional wall motion abnormalities. Definity contrast agent was given IV to delineate the left ventricular  endocardial borders. The left ventricular internal cavity size was normal in size. There is no left ventricular hypertrophy. Indeterminate diastolic filling due to E-A fusion. Right Ventricle: The right ventricular size is normal. No increase in right ventricular wall thickness. Right ventricular systolic function is normal. Left Atrium: Left atrial size was normal in size. Right Atrium: Right atrial size was normal in size. Pericardium: There is no evidence of pericardial effusion. Mitral Valve: The mitral valve is normal in structure. There is mild thickening of the mitral valve leaflet(s). Mild mitral annular calcification. No evidence of mitral valve regurgitation. No evidence of mitral valve stenosis. Tricuspid Valve: The tricuspid valve is normal in structure. Tricuspid valve regurgitation is not demonstrated. No evidence of tricuspid stenosis. Aortic Valve: The aortic valve is normal in structure. There is mild calcification of the aortic valve. Aortic valve regurgitation is not visualized. Mild to moderate aortic valve  sclerosis/calcification is present, without any evidence of aortic stenosis. Pulmonic Valve: The pulmonic valve was normal in structure. Pulmonic valve regurgitation is not visualized. No evidence of pulmonic stenosis. Aorta: Aortic dilatation noted. There is borderline dilatation of the aortic root, measuring 38 mm. Venous: The inferior vena cava is normal in size with greater than 50% respiratory variability, suggesting right atrial pressure of 3 mmHg. IAS/Shunts: No atrial level shunt detected by color flow Doppler. Agitated saline contrast was given intravenously to evaluate for intracardiac shunting. Agitated saline contrast bubble study was negative, with no evidence of any interatrial shunt.  LEFT VENTRICLE PLAX 2D LVIDd:         5.00 cm      Diastology LVIDs:         3.60 cm      LV e' medial:    5.92 cm/s LV PW:         0.90 cm      LV E/e' medial:  13.6 LV IVS:        0.90 cm      LV e' lateral:   6.97 cm/s LVOT diam:     2.40 cm      LV E/e' lateral: 11.5 LV SV:  80 LV SV Index:   38 LVOT Area:     4.52 cm  LV Volumes (MOD) LV vol d, MOD A2C: 121.0 ml LV vol d, MOD A4C: 148.0 ml LV vol s, MOD A2C: 59.5 ml LV vol s, MOD A4C: 72.2 ml LV SV MOD A2C:     61.5 ml LV SV MOD A4C:     148.0 ml LV SV MOD BP:      67.5 ml RIGHT VENTRICLE RV S prime:     12.70 cm/s TAPSE (M-mode): 2.2 cm LEFT ATRIUM           Index       RIGHT ATRIUM           Index LA diam:      3.60 cm 1.71 cm/m  RA Area:     13.90 cm LA Vol (A2C): 31.6 ml 14.99 ml/m RA Volume:   27.20 ml  12.90 ml/m LA Vol (A4C): 40.8 ml 19.35 ml/m  AORTIC VALVE LVOT Vmax:   77.60 cm/s LVOT Vmean:  57.600 cm/s LVOT VTI:    0.176 m  AORTA Ao Root diam: 3.80 cm Ao Asc diam:  3.80 cm MITRAL VALVE MV Area (PHT): 3.17 cm    SHUNTS MV Decel Time: 239 msec    Systemic VTI:  0.18 m MV E velocity: 80.50 cm/s  Systemic Diam: 2.40 cm MV A velocity: 59.20 cm/s MV E/A ratio:  1.36 Mihai Croitoru MD Electronically signed by Sanda Klein MD Signature Date/Time:  12/13/2020/12:44:09 PM    Final    CT HEAD CODE STROKE WO CONTRAST`  Result Date: 12/12/2020 CLINICAL DATA:  Code stroke. Additional history provided: Neuro deficit, acute, stroke suspected. Additional history provided: Last known well on known, left-sided weakness/neglect. EXAM: CT HEAD WITHOUT CONTRAST TECHNIQUE: Contiguous axial images were obtained from the base of the skull through the vertex without intravenous contrast. COMPARISON:  Prior head CT 12/10/2020. FINDINGS: Brain: Streak and beam hardening artifact arising from dental restoration partially obscures the occipital lobes and significantly obscures the posterior fossa. Moderate cerebral atrophy. Comparatively mild cerebellar atrophy. Commensurate prominence of the ventricles and sulci. Mild patchy and ill-defined hypoattenuation within the cerebral white matter is nonspecific, but compatible with chronic small vessel ischemic disease. Redemonstrated chronic lacunar infarct within the right lentiform nucleus. Within the limitations of motion degradation, no acute intracranial hemorrhage or acute demarcated cortical infarct is identified. No extra-axial fluid collection. No evidence of intracranial mass. No midline shift. Vascular: No appreciable hyperdense vessel. Atherosclerotic calcifications. Skull: Normal. Negative for fracture or focal lesion. Sinuses/Orbits: Visualized orbits show no acute finding. Mild mucosal thickening within the right sphenoid sinus. ASPECTS ([place] Stroke Program Early CT Score) - Ganglionic level infarction (caudate, lentiform nuclei, internal capsule, insula, M1-M3 cortex): 7 - Supraganglionic infarction (M4-M6 cortex): 3 Total score (0-10 with 10 being normal): 10 These results were called by telephone at the time of interpretation on 12/12/2020 at 8:04 pm to provider [facility] , who verbally acknowledged these results. IMPRESSION: Streak and beam hardening artifact arising from dental restoration partially obscures  the occipital lobes and significantly obscures the posterior fossa. Within this limitation, there is no evidence of acute intracranial abnormality. ASPECTS is 10. Redemonstrated chronic right basal ganglia lacunar infarct. Moderate cerebral atrophy with mild cerebral white matter chronic small vessel ischemic disease. Comparatively mild cerebellar atrophy. Electronically Signed   By: Kellie Simmering DO   On: 12/12/2020 20:05     Medical Consultants:    None.   Subjective:  [name] more awake this morning and no new complaints tolerated his breakfast well.  Objective:    Vitals:   12/13/20 2104 12/13/20 2335 12/14/20 0401 12/14/20 0735  BP: (!) 113/48 131/61 131/69 139/65  Pulse: 100 (!) 109 (!) 108 (!) 104  Resp: 20   16  Temp:  98.2 F (36.8 C) 98 F (36.7 C) 100.3 F (37.9 C)  TempSrc:  Oral Oral Oral  SpO2: 96% 96% 94% 95%  Weight:      Height:       SpO2: 95 % O2 Flow Rate (L/min): 2 L/min   Intake/Output Summary (Last 24 hours) at 12/14/2020 0844 Last data filed at 12/14/2020 0511 Gross per 24 hour  Intake 100 ml  Output 700 ml  Net -600 ml   Filed Weights   12/13/20 1939  Weight: 89.8 kg    Exam: General exam: In no acute distress. Respiratory system: Good air movement and clear to auscultation. Cardiovascular system: S1 & S2 heard, RRR. No JVD. Gastrointestinal system: Abdomen is nondistended, soft and nontender.  Extremities: No pedal edema. Skin: No rashes, lesions or ulcers Psychiatry: Judgement and insight appear normal. Mood & affect appropriate.   Data Reviewed:    Labs: Basic Metabolic Panel: Recent Labs  Lab 12/09/20 1506 12/10/20 1131 12/12/20 1800 12/12/20 2017 12/13/20 0130  NA 133* 136 131* 133* 130*  K 4.2 3.8 4.0 4.0 4.1  CL 98 103 96* 95* 97*  CO2 22 27 26   --  25  GLUCOSE 105* 118* 132* 125* 110*  BUN 12 11 22  27* 23  CREATININE 0.97 0.90 1.39* 1.20 1.12  CALCIUM 7.7* 8.2* 8.5*  --  7.9*  MG  --   --   --    --  2.3   GFR Estimated Creatinine Clearance: 51 mL/min (by C-G formula based on SCr of 1.12 mg/dL). Liver Function Tests: Recent Labs  Lab 12/12/20 1800 12/13/20 0130  AST 27 26  ALT 57* 47*  ALKPHOS 109 85  BILITOT 1.1 1.5*  PROT 8.5* 7.1  ALBUMIN 2.9* 2.4*   No results for input(s): LIPASE, AMYLASE in the last 168 hours. No results for input(s): AMMONIA in the last 168 hours. Coagulation profile Recent Labs  Lab 12/12/20 1913  INR 1.7*   COVID-19 Labs  Recent Labs    12/13/20 0215  CRP 14.3*    Lab Results  Component Value Date   SARSCOV2NAA NEGATIVE 12/12/2020   SARSCOV2NAA NEGATIVE 11/22/2020   SARSCOV2NAA POSITIVE (A) 12/26/2019    CBC: Recent Labs  Lab 12/10/20 1131 12/12/20 1800 12/12/20 2017 12/13/20 0136  WBC 12.8* 22.7*  --  16.4*  NEUTROABS  --  18.6*  --  13.0*  HGB 7.9* 8.5* 7.8* 7.2*  HCT 25.2* 27.2* 23.0* 22.7*  MCV 91.3 91.6  --  90.4  PLT 273 277  --  188   Cardiac Enzymes: No results for input(s): CKTOTAL, CKMB, CKMBINDEX, TROPONINI in the last 168 hours. BNP (last 3 results) No results for input(s): PROBNP in the last 8760 hours. CBG: No results for input(s): GLUCAP in the last 168 hours. D-Dimer: No results for input(s): DDIMER in the last 72 hours. Hgb A1c: Recent Labs    12/13/20 0130  HGBA1C 6.7*   Lipid Profile: Recent Labs    12/13/20 0130  CHOL 115  HDL 31*  LDLCALC 68  TRIG 81  CHOLHDL 3.7   Thyroid function studies: No results for input(s): TSH, T4TOTAL, T3FREE, THYROIDAB in the last  72 hours.  Invalid input(s): FREET3 Anemia work up: No results for input(s): VITAMINB12, FOLATE, FERRITIN, TIBC, IRON, RETICCTPCT in the last 72 hours. Sepsis Labs: Recent Labs  Lab 12/10/20 1131 12/12/20 1800 12/13/20 0136 12/13/20 0215  PROCALCITON  --   --   --  0.55  WBC 12.8* 22.7* 16.4*  --   LATICACIDVEN  --  1.4  --   --    Microbiology Recent Results (from the past 240 hour(s))  Resp Panel by RT-PCR (Flu  A&B, Covid) Nasopharyngeal Swab     Status: None   Collection Time: 12/12/20  7:13 PM   Specimen: Nasopharyngeal Swab; Nasopharyngeal(NP) swabs in vial transport medium  Result Value Ref Range Status   SARS Coronavirus 2 by RT PCR NEGATIVE NEGATIVE Final    Comment: (NOTE) SARS-CoV-2 target nucleic acids are NOT DETECTED.  The SARS-CoV-2 RNA is generally detectable in upper respiratory specimens during the acute phase of infection. The lowest concentration of SARS-CoV-2 viral copies this assay can detect is 138 copies/mL. A negative result does not preclude SARS-Cov-2 infection and should not be used as the sole basis for treatment or other patient management decisions. A negative result may occur with  improper specimen collection/handling, submission of specimen other than nasopharyngeal swab, presence of viral mutation(s) within the areas targeted by this assay, and inadequate number of viral copies(<138 copies/mL). A negative result must be combined with clinical observations, patient history, and epidemiological information. The expected result is Negative.  Fact Sheet for Patients:  EntrepreneurPulse.com.au  Fact Sheet for Healthcare Providers:  IncredibleEmployment.be  This test is no t yet approved or cleared by the [place] FDA and  has been authorized for detection and/or diagnosis of SARS-CoV-2 by FDA under an Emergency Use Authorization (EUA). This EUA will remain  in effect (meaning this test can be used) for the duration of the COVID-19 declaration under Section 564(b)(1) of the Act, 21 U.S.C.section 360bbb-3(b)(1), unless the authorization is terminated  or revoked sooner.       Influenza A by PCR NEGATIVE NEGATIVE Final   Influenza B by PCR NEGATIVE NEGATIVE Final    Comment: (NOTE) The Xpert Xpress SARS-CoV-2/FLU/RSV plus assay is intended as an aid in the diagnosis of influenza from Nasopharyngeal swab specimens  and should not be used as a sole basis for treatment. Nasal washings and aspirates are unacceptable for Xpert Xpress SARS-CoV-2/FLU/RSV testing.  Fact Sheet for Patients: EntrepreneurPulse.com.au  Fact Sheet for Healthcare Providers: IncredibleEmployment.be  This test is not yet approved or cleared by the [place] FDA and has been authorized for detection and/or diagnosis of SARS-CoV-2 by FDA under an Emergency Use Authorization (EUA). This EUA will remain in effect (meaning this test can be used) for the duration of the COVID-19 declaration under Section 564(b)(1) of the Act, 21 U.S.C. section 360bbb-3(b)(1), unless the authorization is terminated or revoked.  Performed at [facility], [place] [address], [place], [place] [address]      Medications:   . aspirin  81 mg Oral Daily  . deferasirox  1,000 mg Oral QAC breakfast  . simvastatin  20 mg Oral Daily  . torsemide  20 mg Oral Daily  . cyanocobalamin  1,000 mcg Oral Daily   Continuous Infusions: . azithromycin 500 mg (12/13/20 2211)  . cefTRIAXone (ROCEPHIN)  IV Stopped (12/13/20 2139)      LOS: 1 day   Charlynne Cousins  [facility]  12/14/2020, 8:44 AM

## 2020-12-14 NOTE — TOC Initial Note (Signed)
Transition of Care [facility]) - Initial/Assessment Note    Patient Details  Name: Ricky [name] MRN: [id number] Date of Birth: 1931/08/16  Transition of Care [facility] CM/SW Contact:    Loreta Ave, [place] Phone Number: 12/14/2020, 9:20 AM  Clinical Narrative:                 CSW received consult for possible SNF placement at time of discharge. CSW spoke with patient's son Mohamedamin. Javarri  reported that patient's spouse is currently unable to care for patient at their home given patient's current physical needs and fall risk. Adante expressed understanding of PT recommendation and is agreeable to SNF placement at time of discharge for patient. CSW discussed insurance authorization process and provided information for Medicare SNF ratings list. Patient has received the COVID vaccines and booster. Menachem expressed being hopeful for rehab and for his father to feel better soon. No further questions reported at this time.         Patient Goals and CMS Choice        Expected Discharge Plan and Services                                                Prior Living Arrangements/Services                       Activities of Daily Living Home Assistive Devices/Equipment: Gilford Rile (specify type) ADL Screening (condition at time of admission) Patient's cognitive ability adequate to safely complete daily activities?: Yes Is the patient deaf or have difficulty hearing?: Yes Does the patient have difficulty seeing, even when wearing glasses/contacts?: No Does the patient have difficulty concentrating, remembering, or making decisions?: Yes Patient able to express need for assistance with ADLs?: Yes Does the patient have difficulty dressing or bathing?: Yes Independently performs ADLs?: No Communication: Independent Dressing (OT): Dependent Is this a change from baseline?: Change from baseline, expected to last >3 days Grooming: Dependent Is this a change from baseline?:  Change from baseline, expected to last >3 days Feeding: Needs assistance Is this a change from baseline?: Change from baseline, expected to last >3 days Bathing: Dependent Is this a change from baseline?: Change from baseline, expected to last >3 days Toileting: Dependent Is this a change from baseline?: Change from baseline, expected to last >3days In/Out Bed: Dependent Is this a change from baseline?: Change from baseline, expected to last >3 days Walks in Home: Needs assistance Is this a change from baseline?: Change from baseline, expected to last >3 days Does the patient have difficulty walking or climbing stairs?: Yes Weakness of Legs: Both Weakness of Arms/Hands: None  Permission Sought/Granted                  Emotional Assessment              Admission diagnosis:  TIA (transient ischemic attack) [G45.9] Frequent falls [N05.3] Acute metabolic encephalopathy [Z76.73] Leukocytosis, unspecified type [D72.829] Patient Active Problem List   Diagnosis Date Noted  . Acute metabolic encephalopathy 41/93/7902  . Fall at home, initial encounter 12/13/2020  . Mass of lower lobe of left lung 12/13/2020  . Pneumonia of left lower lobe due to infectious organism 12/13/2020  . Chronic diastolic CHF (congestive heart failure) ([place]) 12/13/2020  . Leukocytosis   . Leg edema 12/09/2020  . AKI (acute  kidney injury) ([place]) 11/22/2020  . Thrombocytopenia ([place]) 11/22/2020  . Benign neoplasm of trunk 07/05/2020  . Malignant melanoma of skin of upper limb, including shoulder ([place]) 07/05/2020  . Actinic keratosis 07/05/2020  . Basal cell carcinoma of scalp 07/05/2020  . Benign neoplasm of skin of face 07/05/2020  . BPH with urinary obstruction 07/05/2020  . History of malignant melanoma 07/05/2020  . Knee joint replacement status, left 07/05/2020  . Neoplasm of uncertain behavior of skin 07/05/2020  . Sinus node dysfunction ([place]) 03/28/2020  . Pacemaker 03/28/2020  . Anemia of  chronic disease 02/02/2020  . Myelodysplastic syndrome ([place]) 02/02/2020  . Paroxysmal atrial fibrillation ([place]) 01/22/2020  . Chronic anticoagulation 01/22/2020  . Hx of colonic polyps 01/22/2020  . Symptomatic anemia 12/26/2019  . Coronary artery disease involving native coronary artery 08/24/2015  . Mixed hyperlipidemia 08/24/2015  . Primary osteoarthritis of left knee 08/08/2015  . Boerhaave syndrome 01/27/2006   PCP:  Roetta Sessions, NP Pharmacy:   [id number] - [place], [place] [address] [place] [address] Phone: [phone] Fax: [phone]  [place], [place] - [address] [place] [address] Phone: [phone] Fax: [phone]  [place], [place] [address] Phone: [phone] Fax: [phone]     Social Determinants of Health (SDOH) Interventions    Readmission Risk Interventions No flowsheet data found.

## 2020-12-14 NOTE — [place] FL2 (Signed)
[place] LEVEL OF CARE SCREENING TOOL     IDENTIFICATION  Patient Name: Ricky [name] Birthdate: 10-26-1930 Sex: male Admission Date (Current Location): 12/12/2020  [facility] and [place] Number:  Herbalist and Address:  [facility], [place] [address], [place], [place] [address]      Provider Number: [id number]  Attending Physician Name and Address:  Charlynne Cousins, MD  Relative Name and Phone Number:  Braian Tijerina [phone]    Current Level of Care: Hospital Recommended Level of Care: [place] Prior Approval Number:    Date Approved/Denied:   PASRR Number: [id number]  Discharge Plan: SNF    Current Diagnoses: Patient Active Problem List   Diagnosis Date Noted  . Acute metabolic encephalopathy 28/76/8115  . Fall at home, initial encounter 12/13/2020  . Mass of lower lobe of left lung 12/13/2020  . Pneumonia of left lower lobe due to infectious organism 12/13/2020  . Chronic diastolic CHF (congestive heart failure) ([place]) 12/13/2020  . Leukocytosis   . Leg edema 12/09/2020  . AKI (acute kidney injury) ([place]) 11/22/2020  . Thrombocytopenia ([place]) 11/22/2020  . Benign neoplasm of trunk 07/05/2020  . Malignant melanoma of skin of upper limb, including shoulder ([place]) 07/05/2020  . Actinic keratosis 07/05/2020  . Basal cell carcinoma of scalp 07/05/2020  . Benign neoplasm of skin of face 07/05/2020  . BPH with urinary obstruction 07/05/2020  . History of malignant melanoma 07/05/2020  . Knee joint replacement status, left 07/05/2020  . Neoplasm of uncertain behavior of skin 07/05/2020  . Sinus node dysfunction ([place]) 03/28/2020  . Pacemaker 03/28/2020  . Anemia of chronic disease 02/02/2020  . Myelodysplastic syndrome ([place]) 02/02/2020  . Paroxysmal atrial fibrillation ([place]) 01/22/2020  . Chronic anticoagulation 01/22/2020  . Hx of colonic polyps 01/22/2020  . Symptomatic anemia 12/26/2019  .  Coronary artery disease involving native coronary artery 08/24/2015  . Mixed hyperlipidemia 08/24/2015  . Primary osteoarthritis of left knee 08/08/2015  . Boerhaave syndrome 01/27/2006    Orientation RESPIRATION BLADDER Height & Weight     Self  O2 (2 Liters [place]) Incontinent,External catheter Weight: 197 lb 15.6 oz (89.8 kg) Height:  6' (182.9 cm)  BEHAVIORAL SYMPTOMS/MOOD NEUROLOGICAL BOWEL NUTRITION STATUS      Continent Diet (See dc summary)  AMBULATORY STATUS COMMUNICATION OF NEEDS Skin   Extensive Assist Verbally Normal                       Personal Care Assistance Level of Assistance  Bathing,Feeding,Dressing Bathing Assistance: Limited assistance Feeding assistance: Independent Dressing Assistance: Limited assistance     Functional Limitations Info  Sight,Hearing,Speech Sight Info: Adequate Hearing Info: Impaired Speech Info: Adequate    SPECIAL CARE FACTORS FREQUENCY  PT (By licensed PT),OT (By licensed OT)     PT Frequency: 5x week OT Frequency: 5x week            Contractures Contractures Info: Not present    Additional Factors Info  Code Status,Allergies,Psychotropic Code Status Info: Full Allergies Info: NKA Psychotropic Info: traMADol (ULTRAM) tablet 50 mg every 12 hours as needed for pain         Current Medications (12/14/2020):  This is the current hospital active medication list Current Facility-Administered Medications  Medication Dose Route Frequency Provider Last Rate Last Admin  . acetaminophen (TYLENOL) tablet 650 mg  650 mg Oral Q4H PRN Shalhoub, Sherryll Burger, MD      . acetaminophen (  TYLENOL) tablet 650 mg  650 mg Oral Q6H PRN Charlynne Cousins, MD      . aspirin chewable tablet 81 mg  81 mg Oral Daily Vernelle Emerald, MD   81 mg at 12/13/20 1038  . azithromycin (ZITHROMAX) 500 mg in sodium chloride 0.9 % 250 mL IVPB  500 mg Intravenous QHS Vernelle Emerald, MD 250 mL/hr at 12/13/20 2211 500 mg at 12/13/20 2211  . cefTRIAXone  (ROCEPHIN) 2 g in sodium chloride 0.9 % 100 mL IVPB  2 g Intravenous QHS Shalhoub, Sherryll Burger, MD   Stopped at 12/13/20 2139  . deferasirox (EXJADE) disintegrating tablet 1,000 mg  1,000 mg Oral QAC breakfast Shalhoub, Sherryll Burger, MD      . melatonin tablet 3 mg  3 mg Oral QHS Charlynne Cousins, MD      . QUEtiapine (SEROQUEL) tablet 25 mg  25 mg Oral QHS PRN Charlynne Cousins, MD      . simvastatin (ZOCOR) tablet 20 mg  20 mg Oral Daily Shalhoub, Sherryll Burger, MD   20 mg at 12/13/20 1038  . torsemide (DEMADEX) tablet 20 mg  20 mg Oral Daily Shalhoub, Sherryll Burger, MD      . traMADol Veatrice Bourbon) tablet 50 mg  50 mg Oral Q12H PRN Vernelle Emerald, MD   50 mg at 12/14/20 0507  . vitamin B-12 (CYANOCOBALAMIN) tablet 1,000 mcg  1,000 mcg Oral Daily Shalhoub, Sherryll Burger, MD   1,000 mcg at 12/13/20 1038     Discharge Medications: Please see discharge summary for a list of discharge medications.  Relevant Imaging Results:  Relevant Lab Results:   Additional Information SSN [id number]  Loreta Ave, LCSWA

## 2020-12-15 DIAGNOSIS — W19XXXA Unspecified fall, initial encounter: Secondary | ICD-10-CM | POA: Diagnosis not present

## 2020-12-15 DIAGNOSIS — A419 Sepsis, unspecified organism: Secondary | ICD-10-CM | POA: Diagnosis not present

## 2020-12-15 DIAGNOSIS — D72829 Elevated white blood cell count, unspecified: Secondary | ICD-10-CM | POA: Diagnosis not present

## 2020-12-15 DIAGNOSIS — R918 Other nonspecific abnormal finding of lung field: Secondary | ICD-10-CM | POA: Diagnosis not present

## 2020-12-15 DIAGNOSIS — G459 Transient cerebral ischemic attack, unspecified: Secondary | ICD-10-CM | POA: Diagnosis not present

## 2020-12-15 DIAGNOSIS — Z7901 Long term (current) use of anticoagulants: Secondary | ICD-10-CM | POA: Diagnosis not present

## 2020-12-15 LAB — CBC
HCT: 20.2 % — ABNORMAL LOW (ref 39.0–52.0)
HCT: 24.2 % — ABNORMAL LOW (ref 39.0–52.0)
Hemoglobin: 6.6 g/dL — CL (ref 13.0–17.0)
Hemoglobin: 7.8 g/dL — ABNORMAL LOW (ref 13.0–17.0)
MCH: 29.2 pg (ref 26.0–34.0)
MCH: 28.5 pg (ref 26.0–34.0)
MCHC: 32.7 g/dL (ref 30.0–36.0)
MCHC: 32.2 g/dL (ref 30.0–36.0)
MCV: 89.4 fL (ref 80.0–100.0)
MCV: 88.3 fL (ref 80.0–100.0)
Platelets: 185 10*3/uL (ref 150–400)
Platelets: 167 10*3/uL (ref 150–400)
RBC: 2.26 MIL/uL — ABNORMAL LOW (ref 4.22–5.81)
RBC: 2.74 MIL/uL — ABNORMAL LOW (ref 4.22–5.81)
RDW: 18.6 % — ABNORMAL HIGH (ref 11.5–15.5)
RDW: 17.3 % — ABNORMAL HIGH (ref 11.5–15.5)
WBC: 15.7 10*3/uL — ABNORMAL HIGH (ref 4.0–10.5)
WBC: 14.1 10*3/uL — ABNORMAL HIGH (ref 4.0–10.5)
nRBC: 0 % (ref 0.0–0.2)
nRBC: 0 % (ref 0.0–0.2)

## 2020-12-15 LAB — PREPARE RBC (CROSSMATCH)

## 2020-12-15 MED ORDER — PREDNISONE 20 MG PO TABS
40.0000 mg | ORAL_TABLET | Freq: Every day | ORAL | Status: DC
  Administered 2020-12-15 – 2020-12-17 (×2): 40 mg via ORAL
  Filled 2020-12-15 (×3): qty 2

## 2020-12-15 MED ORDER — POLYETHYLENE GLYCOL 3350 17 G PO PACK
17.0000 g | PACK | Freq: Every day | ORAL | Status: DC
  Administered 2020-12-15 – 2020-12-17 (×2): 17 g via ORAL
  Filled 2020-12-15 (×2): qty 1

## 2020-12-15 MED ORDER — SODIUM CHLORIDE 0.9% IV SOLUTION: Freq: Once | INTRAVENOUS | Status: DC

## 2020-12-15 NOTE — [place] Notes (Signed)
   12/15/20 0241  Provider Notification  Provider Name/Title Dr Myna Hidalgo  Date Provider Notified 12/15/20  Time Provider Notified [phone]  Notification Type Page  Notification Reason Other (Comment) (Lab called a hemoglobin level of 6.6)  Provider response See new orders  Date of Provider Response 12/15/20  Time of Provider Response [phone]

## 2020-12-15 NOTE — [place] Notes (Signed)
Pt have order to transfuse 1 unit of PRBC, son called to obtain consent but went to voicemail, message dropped to call back. Then, blood bank called to say pt's antibodies test was positive, that it will take some times to look for blood that is compatible, pt quiet in bed, will continue to  Monitor. [place], Abb Gobert Efe

## 2020-12-15 NOTE — Progress Notes (Addendum)
[facility] PROGRESS NOTE    Progress Note  Ricky [name]  FGH:[id number] DOB: September 30, 1930 DOA: 12/12/2020 PCP: Roetta Sessions, NP     Brief Narrative:   Ricky [name] is an 87 y.o. male past medical history of Miller dysplastic syndrome, paroxysmal atrial fibrillation on Eliquis systolic heart failure Boerhaave syndrome presented to the ED for recurrent falls, he was recently seen in the ED with rib fractures for mechanical fall, incidentally was also found to have a large necrotic appearing lung mass in the left lower lobe concerning for bronchogenic carcinoma, son and patient denies any loss of consciousness no loss of sphincter control in the ED they noticed left arm weakness and a facial droop,    Significant studies: 12/13/2020 CT of the head streak and beam hardening artifact arising from the dental restoration partially obscured occipital lobe and obscured posterior fossa, chronic right basal ganglia lacunar infarct 12/13/2020 CT angio of the neck showed 2 mm vascular protrusion arising for the paraclinoid right ICA, bilateral caloric artery without significant stenosis. 12/13/2020 2 CT angio of the head no intracranial large vessel occlusions  Antibiotics: 12/12/2020 Rocephin and azithromycin  Microbiology data: Blood culture:  Procedures: None  Assessment/Plan:   Acute metabolic encephalopathy: The most likely etiology likely due to infectious process. He was started empirically on antibiotics and IV fluids. Neurology was consulted recommended stroke work-up multiple imaging were negative for CVA. Left shoulder x-ray showed no acute findings. With a new left lower lobe infiltrate on chest x-ray today mild leukocytosis tachycardic encephalopathic. Blood cultures obtained.  Severe sepsis due to postobstructive pneumonia present on admission: Started empirically on Rocephin and azithromycin. He has remained afebrile, has been weaned to room  air. Leukocytosis improving. Blood cultures have remained negative till date. Continue to hold Eliquis until after bronchoscopy.  Normocytic anemia: There is likely hemoconcentration there is no signs of overt bleeding. His Eliquis was held. We will go ahead and transfuse him 2 units packed red blood cells recheck a CBC posttransfusion all.  There is a delay in transfusion due to his antibodies.  Necrotic mass of the left lower lobe: Incidentally diagnosed on previous visits to the ED. Contact pulmonary for bronchoscopy on 09/17/2021. Hold Eliquis till then.  Acute gout flare: X-ray of the right hand and wrist show no swelling degenerative changes. Hand is warm to touch extremely tender to light touch. We will go ahead and start him on steroids.  Fall at home, initial encounter There was a report of left upper extremity weakness and facial droop concern by the ED MRI cannot be performed due to pacemaker neurology was consulted recommended admission and monitor with serial neurological examination and TIA work-up. Eliquis held for biopsy now on aspirin for bronchoscopy on Monday. CT scan of the head no acute CVA. Evaluated the patient recommended skilled nursing facility.  Paroxysmal atrial fibrillation: Currently rate controlled in sinus rhythm. Hold Eliquis for bronchoscopy.  Myelodysplastic syndrome: Follow-up with Dr. Lindi Adie as an outpatient.  Coronary artery disease involving the native arteries: Currently chest pain-free continue current home regimen.  Chronic diastolic heart failure: Slightly hypervolemic on admission. Diuretic therapy was held he was started on IV fluid hydration     DVT prophylaxis: Lovenox Family Communication: Son Status is: Observation  The patient remains OBS appropriate and will d/c before 2 midnights.  Dispo: The patient is from: Home              Anticipated d/c is to: SNF  Patient currently is not medically stable to  d/c.   Difficult to place patient No   Code Status:     Code Status Orders  (From admission, onward)         Start     Ordered   12/13/20 0136  Full code  Continuous        12/13/20 0135        Code Status History    Date Active Date Inactive Code Status Order ID Comments User Context   11/22/2020 2354 11/23/2020 2326 Full Code [id number]  Elwyn Reach, MD ED   12/26/2019 1844 12/27/2019 2316 Full Code [id number]  Alma Friendly, MD ED   Advance Care Planning Activity        IV Access:    Peripheral IV   Procedures and diagnostic studies:   DG Wrist 2 Views Right  Result Date: 12/14/2020 CLINICAL DATA:  Recent fall with wrist pain and swelling, initial encounter EXAM: RIGHT WRIST - 2 VIEW COMPARISON:  None. FINDINGS: No acute fracture or dislocation is noted. Degenerative changes of the radiocarpal joint are seen. Diffuse vascular calcifications are noted as well. No other focal abnormality is seen. IMPRESSION: Degenerative change without acute abnormality. Electronically Signed   By: Inez Catalina M.D.   On: 12/14/2020 20:40   CT HEAD WO CONTRAST  Result Date: 12/13/2020 CLINICAL DATA:  Stroke suspected. EXAM: CT HEAD WITHOUT CONTRAST TECHNIQUE: Contiguous axial images were obtained from the base of the skull through the vertex without intravenous contrast. COMPARISON:  Head CT December 12, 2020 FINDINGS: Brain: No evidence of acute large vascular territory infarction, hemorrhage, hydrocephalus, extra-axial collection or mass lesion/mass effect. Moderate global parenchymal volume loss with commensurate ex vacuo dilatation of the ventricular system. Similar mild burden of white matter chronic small vessel ischemic disease. Redemonstrated chronic lacunar infarct within the right lentiform nucleus. Vascular: No hyperdense vessel. Atherosclerotic calcifications of the skull base. Skull: Normal. Negative for fracture or focal lesion. Sinuses/Orbits: Mild mucosal thickening in the  right sphenoid sinus and scattered ethmoid air cells. Other: Streak artifact from dental hardware. IMPRESSION: 1. No evidence of acute intracranial abnormality. 2. Moderate global parenchymal volume loss and mild chronic small vessel ischemic disease. Redemonstrated chronic lacunar infarct within the right lentiform nucleus. Electronically Signed   By: Dahlia Bailiff MD   On: 12/13/2020 23:37   DG Hand 2 View Right  Result Date: 12/14/2020 CLINICAL DATA:  Recent fall with hand pain, initial encounter EXAM: RIGHT HAND - 2 VIEW COMPARISON:  None. FINDINGS: No acute fracture or dislocation is noted. No soft tissue abnormality is seen. Degenerative changes of the radiocarpal joint are seen. IMPRESSION: No acute abnormality noted.  Mild degenerative changes seen. Electronically Signed   By: Inez Catalina M.D.   On: 12/14/2020 20:44   DG Shoulder Left Port  Result Date: 12/13/2020 CLINICAL DATA:  Weakness, shoulder pain EXAM: LEFT SHOULDER COMPARISON:  None FINDINGS: Osseous demineralization. Degenerative changes LEFT AC joint with joint space narrowing and spur formation. Additional degenerative changes at LEFT glenohumeral joint. Approximation of LEFT humeral head with undersurface of acromion, raising question of rotator cuff tear. No acute fracture, dislocation, or bone destruction. Atherosclerotic calcification aorta and pacemaker leads noted. Question LEFT pleural effusion. IMPRESSION: Degenerative changes of LEFT acromioclavicular and glenohumeral joints. Approximation of humeral head with undersurface of acromion raising question of rotator cuff tear. Electronically Signed   By: Lavonia Dana M.D.   On: 12/13/2020 18:24   ECHOCARDIOGRAM COMPLETE BUBBLE STUDY  Result Date: 12/13/2020    ECHOCARDIOGRAM REPORT   Patient Name:   TYLIN FORCE Date of Exam: 12/13/2020 Medical Rec #:  [id number]          Height:       71.0 in Accession #:    [id number]         Weight:       200.0 lb Date of Birth:  December 31, 1930           BSA:          2.108 m Patient Age:    12 years           BP:           131/70 mmHg Patient Gender: M                  HR:           94 bpm. Exam Location:  Inpatient Procedure: 2D Echo, Cardiac Doppler, Color Doppler and Intracardiac            Opacification Agent Indications:    TIA (transient ischemic attack) 435.9 / G45.9  History:        Patient has prior history of Echocardiogram examinations, most                 recent 05/29/2020. Arrythmias:Atrial Fibrillation; Risk                 Factors:Former Smoker.  Sonographer:    Vickie Epley RDCS Referring Phys: [id number] [place]  1. Left ventricular ejection fraction, by estimation, is 60 to 65%. The left ventricle has normal function. The left ventricle has no regional wall motion abnormalities. Indeterminate diastolic filling due to E-A fusion.  2. Right ventricular systolic function is normal. The right ventricular size is normal.  3. The mitral valve is normal in structure. No evidence of mitral valve regurgitation. No evidence of mitral stenosis.  4. The aortic valve is normal in structure. There is mild calcification of the aortic valve. Aortic valve regurgitation is not visualized. Mild to moderate aortic valve sclerosis/calcification is present, without any evidence of aortic stenosis.  5. Aortic dilatation noted. There is borderline dilatation of the aortic root, measuring 38 mm.  6. The inferior vena cava is normal in size with greater than 50% respiratory variability, suggesting right atrial pressure of 3 mmHg.  7. Agitated saline contrast bubble study was negative, with no evidence of any interatrial shunt. Comparison(s): No significant change from prior study. Prior images reviewed side by side. FINDINGS  Left Ventricle: Left ventricular ejection fraction, by estimation, is 60 to 65%. The left ventricle has normal function. The left ventricle has no regional wall motion abnormalities. Definity contrast agent was given IV to  delineate the left ventricular  endocardial borders. The left ventricular internal cavity size was normal in size. There is no left ventricular hypertrophy. Indeterminate diastolic filling due to E-A fusion. Right Ventricle: The right ventricular size is normal. No increase in right ventricular wall thickness. Right ventricular systolic function is normal. Left Atrium: Left atrial size was normal in size. Right Atrium: Right atrial size was normal in size. Pericardium: There is no evidence of pericardial effusion. Mitral Valve: The mitral valve is normal in structure. There is mild thickening of the mitral valve leaflet(s). Mild mitral annular calcification. No evidence of mitral valve regurgitation. No evidence of mitral valve stenosis. Tricuspid Valve: The tricuspid valve is normal in structure. Tricuspid valve regurgitation is not  demonstrated. No evidence of tricuspid stenosis. Aortic Valve: The aortic valve is normal in structure. There is mild calcification of the aortic valve. Aortic valve regurgitation is not visualized. Mild to moderate aortic valve sclerosis/calcification is present, without any evidence of aortic stenosis. Pulmonic Valve: The pulmonic valve was normal in structure. Pulmonic valve regurgitation is not visualized. No evidence of pulmonic stenosis. Aorta: Aortic dilatation noted. There is borderline dilatation of the aortic root, measuring 38 mm. Venous: The inferior vena cava is normal in size with greater than 50% respiratory variability, suggesting right atrial pressure of 3 mmHg. IAS/Shunts: No atrial level shunt detected by color flow Doppler. Agitated saline contrast was given intravenously to evaluate for intracardiac shunting. Agitated saline contrast bubble study was negative, with no evidence of any interatrial shunt.  LEFT VENTRICLE PLAX 2D LVIDd:         5.00 cm      Diastology LVIDs:         3.60 cm      LV e' medial:    5.92 cm/s LV PW:         0.90 cm      LV E/e' medial:   13.6 LV IVS:        0.90 cm      LV e' lateral:   6.97 cm/s LVOT diam:     2.40 cm      LV E/e' lateral: 11.5 LV SV:         80 LV SV Index:   38 LVOT Area:     4.52 cm  LV Volumes (MOD) LV vol d, MOD A2C: 121.0 ml LV vol d, MOD A4C: 148.0 ml LV vol s, MOD A2C: 59.5 ml LV vol s, MOD A4C: 72.2 ml LV SV MOD A2C:     61.5 ml LV SV MOD A4C:     148.0 ml LV SV MOD BP:      67.5 ml RIGHT VENTRICLE RV S prime:     12.70 cm/s TAPSE (M-mode): 2.2 cm LEFT ATRIUM           Index       RIGHT ATRIUM           Index LA diam:      3.60 cm 1.71 cm/m  RA Area:     13.90 cm LA Vol (A2C): 31.6 ml 14.99 ml/m RA Volume:   27.20 ml  12.90 ml/m LA Vol (A4C): 40.8 ml 19.35 ml/m  AORTIC VALVE LVOT Vmax:   77.60 cm/s LVOT Vmean:  57.600 cm/s LVOT VTI:    0.176 m  AORTA Ao Root diam: 3.80 cm Ao Asc diam:  3.80 cm MITRAL VALVE MV Area (PHT): 3.17 cm    SHUNTS MV Decel Time: 239 msec    Systemic VTI:  0.18 m MV E velocity: 80.50 cm/s  Systemic Diam: 2.40 cm MV A velocity: 59.20 cm/s MV E/A ratio:  1.36 Mihai Croitoru MD Electronically signed by Sanda Klein MD Signature Date/Time: 12/13/2020/12:44:09 PM    Final      Medical Consultants:    None.   Subjective:    [name] he relates his right hand is hurting him, even light touch is like a knife stabbing him.  Objective:    Vitals:   12/14/20 2001 12/14/20 2317 12/15/20 0416 12/15/20 0737  BP: (!) 117/55 138/61 127/60 125/63  Pulse: 93 96 95 97  Resp: 20 19 17 18   Temp: 100.2 F (37.9 C) 99.5 F (37.5 C) 98.6  F (37 C) 98 F (36.7 C)  TempSrc: Oral Oral Oral Oral  SpO2: 95% 95% 96% 91%  Weight:      Height:       SpO2: 91 % O2 Flow Rate (L/min): 2 L/min   Intake/Output Summary (Last 24 hours) at 12/15/2020 0746 Last data filed at 12/15/2020 0616 Gross per 24 hour  Intake 100 ml  Output 900 ml  Net -800 ml   Filed Weights   12/13/20 1939  Weight: 89.8 kg    Exam: General exam: In no acute distress. Respiratory system: Good air  movement and clear to auscultation. Cardiovascular system: S1 & S2 heard, RRR. No JVD. Gastrointestinal system: Abdomen is nondistended, soft and nontender.  Extremities: Right hand is extremely swollen from the wrist and fingers, wrist and fingers are warm to touch and exquisitely tender to light palpation Skin: No rashes, lesions or ulcers Psychiatry: Judgement and insight appear normal. Mood & affect appropriate.   Data Reviewed:    Labs: Basic Metabolic Panel: Recent Labs  Lab 12/09/20 1506 12/10/20 1131 12/12/20 1800 12/12/20 2017 12/13/20 0130  NA 133* 136 131* 133* 130*  K 4.2 3.8 4.0 4.0 4.1  CL 98 103 96* 95* 97*  CO2 22 27 26   --  25  GLUCOSE 105* 118* 132* 125* 110*  BUN 12 11 22  27* 23  CREATININE 0.97 0.90 1.39* 1.20 1.12  CALCIUM 7.7* 8.2* 8.5*  --  7.9*  MG  --   --   --   --  2.3   GFR Estimated Creatinine Clearance: 51 mL/min (by C-G formula based on SCr of 1.12 mg/dL). Liver Function Tests: Recent Labs  Lab 12/12/20 1800 12/13/20 0130  AST 27 26  ALT 57* 47*  ALKPHOS 109 85  BILITOT 1.1 1.5*  PROT 8.5* 7.1  ALBUMIN 2.9* 2.4*   No results for input(s): LIPASE, AMYLASE in the last 168 hours. No results for input(s): AMMONIA in the last 168 hours. Coagulation profile Recent Labs  Lab 12/12/20 1913  INR 1.7*   COVID-19 Labs  Recent Labs    12/13/20 0215  CRP 14.3*    Lab Results  Component Value Date   SARSCOV2NAA NEGATIVE 12/12/2020   SARSCOV2NAA NEGATIVE 11/22/2020   SARSCOV2NAA POSITIVE (A) 12/26/2019    CBC: Recent Labs  Lab 12/10/20 1131 12/12/20 1800 12/12/20 2017 12/13/20 0136 12/14/20 0928 12/15/20 0156  WBC 12.8* 22.7*  --  16.4* 17.4* 15.7*  NEUTROABS  --  18.6*  --  13.0* 14.6*  --   HGB 7.9* 8.5* 7.8* 7.2* 7.0* 6.6*  HCT 25.2* 27.2* 23.0* 22.7* 20.7* 20.2*  MCV 91.3 91.6  --  90.4 87.0 89.4  PLT 273 277  --  188 237 185   Cardiac Enzymes: No results for input(s): CKTOTAL, CKMB, CKMBINDEX, TROPONINI in the  last 168 hours. BNP (last 3 results) No results for input(s): PROBNP in the last 8760 hours. CBG: No results for input(s): GLUCAP in the last 168 hours. D-Dimer: No results for input(s): DDIMER in the last 72 hours. Hgb A1c: Recent Labs    12/13/20 0130  HGBA1C 6.7*   Lipid Profile: Recent Labs    12/13/20 0130  CHOL 115  HDL 31*  LDLCALC 68  TRIG 81  CHOLHDL 3.7   Thyroid function studies: No results for input(s): TSH, T4TOTAL, T3FREE, THYROIDAB in the last 72 hours.  Invalid input(s): FREET3 Anemia work up: No results for input(s): VITAMINB12, FOLATE, FERRITIN, TIBC, IRON, RETICCTPCT in the last  72 hours. Sepsis Labs: Recent Labs  Lab 12/12/20 1800 12/13/20 0136 12/13/20 0215 12/14/20 0928 12/15/20 0156  PROCALCITON  --   --  0.55  --   --   WBC 22.7* 16.4*  --  17.4* 15.7*  LATICACIDVEN 1.4  --   --   --   --    Microbiology Recent Results (from the past 240 hour(s))  Resp Panel by RT-PCR (Flu A&B, Covid) Nasopharyngeal Swab     Status: None   Collection Time: 12/12/20  7:13 PM   Specimen: Nasopharyngeal Swab; Nasopharyngeal(NP) swabs in vial transport medium  Result Value Ref Range Status   SARS Coronavirus 2 by RT PCR NEGATIVE NEGATIVE Final    Comment: (NOTE) SARS-CoV-2 target nucleic acids are NOT DETECTED.  The SARS-CoV-2 RNA is generally detectable in upper respiratory specimens during the acute phase of infection. The lowest concentration of SARS-CoV-2 viral copies this assay can detect is 138 copies/mL. A negative result does not preclude SARS-Cov-2 infection and should not be used as the sole basis for treatment or other patient management decisions. A negative result may occur with  improper specimen collection/handling, submission of specimen other than nasopharyngeal swab, presence of viral mutation(s) within the areas targeted by this assay, and inadequate number of viral copies(<138 copies/mL). A negative result must be combined  with clinical observations, patient history, and epidemiological information. The expected result is Negative.  Fact Sheet for Patients:  EntrepreneurPulse.com.au  Fact Sheet for Healthcare Providers:  IncredibleEmployment.be  This test is no t yet approved or cleared by the [place] FDA and  has been authorized for detection and/or diagnosis of SARS-CoV-2 by FDA under an Emergency Use Authorization (EUA). This EUA will remain  in effect (meaning this test can be used) for the duration of the COVID-19 declaration under Section 564(b)(1) of the Act, 21 U.S.C.section 360bbb-3(b)(1), unless the authorization is terminated  or revoked sooner.       Influenza A by PCR NEGATIVE NEGATIVE Final   Influenza B by PCR NEGATIVE NEGATIVE Final    Comment: (NOTE) The Xpert Xpress SARS-CoV-2/FLU/RSV plus assay is intended as an aid in the diagnosis of influenza from Nasopharyngeal swab specimens and should not be used as a sole basis for treatment. Nasal washings and aspirates are unacceptable for Xpert Xpress SARS-CoV-2/FLU/RSV testing.  Fact Sheet for Patients: EntrepreneurPulse.com.au  Fact Sheet for Healthcare Providers: IncredibleEmployment.be  This test is not yet approved or cleared by the [place] FDA and has been authorized for detection and/or diagnosis of SARS-CoV-2 by FDA under an Emergency Use Authorization (EUA). This EUA will remain in effect (meaning this test can be used) for the duration of the COVID-19 declaration under Section 564(b)(1) of the Act, 21 U.S.C. section 360bbb-3(b)(1), unless the authorization is terminated or revoked.  Performed at [facility], [place] [address], [place], [place] [address]   Culture, blood (routine x 2)     Status: None (Preliminary result)   Collection Time: 12/13/20  4:16 AM   Specimen: BLOOD  Result Value Ref Range Status   Specimen Description  BLOOD SITE NOT SPECIFIED  Final   Special Requests   Final    BOTTLES DRAWN AEROBIC AND ANAEROBIC Blood Culture adequate volume   Culture   Final    NO GROWTH 1 DAY Performed at [facility], [place] [address], [place], [place] [address]    Report Status PENDING  Incomplete  Culture, blood (routine x 2)     Status:  None (Preliminary result)   Collection Time: 12/13/20  4:17 AM   Specimen: BLOOD  Result Value Ref Range Status   Specimen Description BLOOD SITE NOT SPECIFIED  Final   Special Requests   Final    BOTTLES DRAWN AEROBIC AND ANAEROBIC Blood Culture results may not be optimal due to an inadequate volume of blood received in culture bottles   Culture   Final    NO GROWTH 1 DAY Performed at [facility] [address] [place], [place] [address]    Report Status PENDING  Incomplete     Medications:   . sodium chloride   Intravenous Once  . aspirin  81 mg Oral Daily  . Gerhardt's butt cream   Topical BID  . melatonin  3 mg Oral QHS  . simvastatin  20 mg Oral Daily  . torsemide  20 mg Oral Daily  . cyanocobalamin  1,000 mcg Oral Daily   Continuous Infusions: . azithromycin 500 mg (12/14/20 2214)  . cefTRIAXone (ROCEPHIN)  IV Stopped (12/14/20 2141)      LOS: 2 days   Charlynne Cousins  [facility]  12/15/2020, 7:46 AM

## 2020-12-16 ENCOUNTER — Encounter (HOSPITAL_COMMUNITY)
Admission: EM | Disposition: A | Discharge status: Skilled Nursing Facility | Payer: Self-pay | Source: Home / Self Care | Attending: Internal Medicine

## 2020-12-16 ENCOUNTER — Inpatient Hospital Stay (HOSPITAL_COMMUNITY): Payer: Medicare Other

## 2020-12-16 ENCOUNTER — Inpatient Hospital Stay (HOSPITAL_COMMUNITY): Payer: Medicare Other | Admitting: Certified Registered Nurse Anesthetist

## 2020-12-16 DIAGNOSIS — J189 Pneumonia, unspecified organism: Secondary | ICD-10-CM | POA: Diagnosis not present

## 2020-12-16 DIAGNOSIS — G459 Transient cerebral ischemic attack, unspecified: Secondary | ICD-10-CM | POA: Diagnosis not present

## 2020-12-16 DIAGNOSIS — D72829 Elevated white blood cell count, unspecified: Secondary | ICD-10-CM | POA: Diagnosis not present

## 2020-12-16 DIAGNOSIS — W19XXXA Unspecified fall, initial encounter: Secondary | ICD-10-CM | POA: Diagnosis not present

## 2020-12-16 DIAGNOSIS — Z20822 Contact with and (suspected) exposure to covid-19: Secondary | ICD-10-CM | POA: Diagnosis not present

## 2020-12-16 DIAGNOSIS — R918 Other nonspecific abnormal finding of lung field: Secondary | ICD-10-CM | POA: Diagnosis not present

## 2020-12-16 DIAGNOSIS — A419 Sepsis, unspecified organism: Secondary | ICD-10-CM | POA: Diagnosis not present

## 2020-12-16 DIAGNOSIS — Z7901 Long term (current) use of anticoagulants: Secondary | ICD-10-CM | POA: Diagnosis not present

## 2020-12-16 DIAGNOSIS — G9341 Metabolic encephalopathy: Secondary | ICD-10-CM | POA: Diagnosis not present

## 2020-12-16 HISTORY — PX: BRONCHIAL BRUSHINGS: SHX5108

## 2020-12-16 HISTORY — PX: FINE NEEDLE ASPIRATION: SHX5430

## 2020-12-16 HISTORY — PX: VIDEO BRONCHOSCOPY WITH ENDOBRONCHIAL ULTRASOUND: SHX6177

## 2020-12-16 HISTORY — PX: BRONCHIAL BIOPSY: SHX5109

## 2020-12-16 LAB — CBC
HCT: 23.8 % — ABNORMAL LOW (ref 39.0–52.0)
Hemoglobin: 7.8 g/dL — ABNORMAL LOW (ref 13.0–17.0)
MCH: 29 pg (ref 26.0–34.0)
MCHC: 32.8 g/dL (ref 30.0–36.0)
MCV: 88.5 fL (ref 80.0–100.0)
Platelets: 161 10*3/uL (ref 150–400)
RBC: 2.69 MIL/uL — ABNORMAL LOW (ref 4.22–5.81)
RDW: 17.4 % — ABNORMAL HIGH (ref 11.5–15.5)
WBC: 12.1 10*3/uL — ABNORMAL HIGH (ref 4.0–10.5)
nRBC: 0 % (ref 0.0–0.2)

## 2020-12-16 SURGERY — BRONCHOSCOPY, WITH EBUS
Anesthesia: General | Laterality: Left

## 2020-12-16 MED ORDER — ONDANSETRON HCL 4 MG/2ML IJ SOLN
INTRAMUSCULAR | Status: DC | PRN
  Administered 2020-12-16: 4 mg via INTRAVENOUS

## 2020-12-16 MED ORDER — SUGAMMADEX SODIUM 200 MG/2ML IV SOLN
INTRAVENOUS | Status: DC | PRN
  Administered 2020-12-16: 200 mg via INTRAVENOUS

## 2020-12-16 MED ORDER — FENTANYL CITRATE (PF) 100 MCG/2ML IJ SOLN
INTRAMUSCULAR | Status: AC
  Filled 2020-12-16: qty 2

## 2020-12-16 MED ORDER — LIDOCAINE 2% (20 MG/ML) 5 ML SYRINGE
INTRAMUSCULAR | Status: DC | PRN
  Administered 2020-12-16: 60 mg via INTRAVENOUS

## 2020-12-16 MED ORDER — PROPOFOL 10 MG/ML IV BOLUS
INTRAVENOUS | Status: DC | PRN
  Administered 2020-12-16: 90 mg via INTRAVENOUS

## 2020-12-16 MED ORDER — ROCURONIUM BROMIDE 10 MG/ML (PF) SYRINGE
PREFILLED_SYRINGE | INTRAVENOUS | Status: DC | PRN
  Administered 2020-12-16: 60 mg via INTRAVENOUS

## 2020-12-16 MED ORDER — FENTANYL CITRATE (PF) 250 MCG/5ML IJ SOLN
INTRAMUSCULAR | Status: DC | PRN
  Administered 2020-12-16: 50 ug via INTRAVENOUS

## 2020-12-16 MED ORDER — PHENYLEPHRINE HCL-NACL 10-0.9 MG/250ML-% IV SOLN
INTRAVENOUS | Status: DC | PRN
  Administered 2020-12-16: 20 ug/min via INTRAVENOUS

## 2020-12-16 MED ORDER — CHLORHEXIDINE GLUCONATE 0.12 % MT SOLN: 15.0000 mL | Freq: Once | OROMUCOSAL | Status: AC

## 2020-12-16 MED ORDER — PHENYLEPHRINE 40 MCG/ML (10ML) SYRINGE FOR IV PUSH (FOR BLOOD PRESSURE SUPPORT)
PREFILLED_SYRINGE | INTRAVENOUS | Status: DC | PRN
  Administered 2020-12-16: 80 ug via INTRAVENOUS

## 2020-12-16 MED ORDER — SODIUM CHLORIDE 0.9 % IV SOLN: INTRAVENOUS | Status: DC | PRN

## 2020-12-16 MED ORDER — CHLORHEXIDINE GLUCONATE 0.12 % MT SOLN
OROMUCOSAL | Status: AC
  Administered 2020-12-16: 15 mL via OROMUCOSAL
  Filled 2020-12-16: qty 15

## 2020-12-16 MED ORDER — LACTATED RINGERS IV SOLN: INTRAVENOUS | Status: DC

## 2020-12-16 NOTE — Progress Notes (Addendum)
[facility]  Bronchoscopy performed: thick mucus plugging the left mainstem coming from the left lower lobe. This was very thick and difficult to suction. Once the airway was cleaned the underlying mucosa was nodular, edematous and inflammed.  Brushings, biopsies, and bal were taken from there.  The only visible lymph node on EBUS Was 4L which was about 1.2 cm in size.  All preliminary reports from cytopath was that all tissue was heavily inflamed, need to await final pathology.  Given the amount of mucus and appearance would suspect that this is chronic inflammation from aspiration if there is no evidence of malignancy seen on path.  I called his son and gave an update.  Roselie Awkward, MD [facility] Pager: [phone] Cell: [phone] If no response, please call [phone] until 7pm After 7:00 pm call Elink  [phone]

## 2020-12-16 NOTE — Progress Notes (Addendum)
Pt returned from PACU at this time.  Alert and oriented to self and place.  Son at bedside.  Pt denies pain at present.  Hungry.  Tray ordered.

## 2020-12-16 NOTE — Progress Notes (Signed)
SLP Cancellation Note  Patient Details Name: Ricky [name] MRN: [id number] DOB: 1931/09/19   Cancelled treatment:  Pt out of room for procedure. Will continue efforts.  Ricky [name] L. Tivis Ringer, [place] CCC/SLP Acute Rehabilitation Services Office number [phone] Pager [phone]                                                                                                  Ricky [name] Laurice 12/16/2020, 11:29 AM

## 2020-12-16 NOTE — Anesthesia Postprocedure Evaluation (Signed)
Anesthesia Post Note  Patient: Ricky [name]  Procedure(s) Performed: VIDEO BRONCHOSCOPY WITH ENDOBRONCHIAL ULTRASOUND (Left ) BRONCHIAL BRUSHINGS BRONCHIAL BIOPSIES FINE NEEDLE ASPIRATION (FNA) LINEAR     Patient location during evaluation: PACU Anesthesia Type: General Level of consciousness: awake and alert Pain management: pain level controlled Vital Signs Assessment: post-procedure vital signs reviewed and stable Respiratory status: spontaneous breathing, nonlabored ventilation and respiratory function stable Cardiovascular status: blood pressure returned to baseline and stable Postop Assessment: no apparent nausea or vomiting Anesthetic complications: no   No complications documented.  Last Vitals:  Vitals:   12/16/20 1345 12/16/20 1356  BP: 124/73 140/70  Pulse: 80 85  Resp: (!) 24 (!) 21  Temp:    SpO2: 98% 97%    Last Pain:  Vitals:   12/16/20 0826  TempSrc: Oral  PainSc:                  Renise Gillies,W. EDMOND

## 2020-12-16 NOTE — TOC Progression Note (Signed)
Transition of Care [facility]) - Progression Note    Patient Details  Name: KAIYAN LUCZAK MRN: [id number] Date of Birth: May 04, 1931  Transition of Care [facility]) CM/SW [place], [employer] Phone Number: 12/16/2020, 3:38 PM  Clinical Narrative:   CSW spoke with patient's son, Ed, via phone to discuss bed offers. Ed chose [facility]. CSW confirmed bed availability at [facility], placed order for COVID test for possible discharge to SNF tomorrow if medically stable. CSW faxed in request to [facility], will likely need new PT note but no one assigned or available today due to patient's procedure. CSW to follow.    Expected Discharge Plan: Skilled Nursing Facility Barriers to Discharge: Continued Medical Work up,Insurance Authorization  Expected Discharge Plan and Services Expected Discharge Plan: [place] In-house Referral: Clinical Social Work     Living arrangements for the past 2 months: [place]                                       Social Determinants of Health (SDOH) Interventions    Readmission Risk Interventions No flowsheet data found.

## 2020-12-16 NOTE — Op Note (Signed)
[facility] Cardiopulmonary Patient Name: Ricky [name] Date: 12/16/2020 MRN: [id number] Attending MD: Juanito Doom , MD Date of Birth: [birth year] CSN: Finalized Age: 87 Admit Type: Inpatient Gender: Male Procedure:             Bronchoscopy Indications:           Left lower lobe mass, Mediastinal adenopathy Providers:             Nathaneil Canary B. Lake Bells, MD, Vista Lawman, RN, Particia Nearing, RN, Ladona Ridgel, Technician Referring MD:           Medicines:             General Anesthesia Complications:         No immediate complications Estimated Blood Loss:  Estimated blood loss was minimal. Procedure:             Pre-Anesthesia Assessment:                        - A History and Physical has been performed. Patient                         meds and allergies have been reviewed. The patient is                         unable to give consent secondary to the patient's                         altered mental status. The risks and benefits of the                         procedure and the sedation options and risks were                         discussed with the patient's son. All questions were                         answered and informed consent was obtained. Patient                         identification and proposed procedure were verified                         prior to the procedure by the physician and the nurse                         in the pre-procedure area. Mental Status Examination:                         alert but confused. Airway Examination: normal                         oropharyngeal airway. Respiratory Examination:                         bibasilar crackles. CV Examination: irregularly  irregular rate and rhythm. ASA Grade Assessment: II -                         A patient with mild systemic disease. After reviewing                         the risks and benefits, the patient was deemed in                          satisfactory condition to undergo the procedure. The                         anesthesia plan was to use moderate sedation /                         analgesia (conscious sedation). Immediately prior to                         administration of medications, the patient was                         re-assessed for adequacy to receive sedatives. The                         heart rate, respiratory rate, oxygen saturations,                         blood pressure, adequacy of pulmonary ventilation, and                         response to care were monitored throughout the                         procedure. The physical status of the patient was                         re-assessed after the procedure.                        After obtaining informed consent, the bronchoscope was                         passed under direct vision. Throughout the procedure,                         the patient's blood pressure, pulse, and oxygen                         saturations were monitored continuously. the BF-[id number]                         ([id number]) Olympus Therapeutic Bronchoscope was                         introduced through the mouth, via the endotracheal                         tube and advanced to the tracheobronchial tree. the  BF-[id number] ([id number]) Olympus EBUS scope was introduced                         through the and advanced to the. The procedure was                         accomplished without difficulty. The patient tolerated                         the procedure well. The total duration of the                         procedure was 35 minutes. Scope In: Scope Out: Findings:      The nasopharynx/oropharynx appears normal. The larynx appears normal.       The vocal cords appear normal. The subglottic space is normal. The       trachea is of normal caliber. The carina is sharp. The tracheobronchial       tree of the right lung was examined to at least the first  subsegmental       level. Bronchial mucosa and anatomy in the right lung are normal; there       are no endobronchial lesions, and no secretions.      Left Lung Abnormalities: Mucus, plugging the airway, was found in the       left mainstem bronchus. The mucus was copious, mucoid and thick. The       underlying mucosa is inflamed.      Left Lung Abnormalities: Nodular mucosa was found in the left lower       lobe. Nodular mucosa was found in the left upper lobe. Fluoroscopy       guided brushings were obtained in the left lower lobe with a cytology       brush and sent for routine cytology. Three samples were obtained.       Endobronchial biopsies were performed in the left lower lobe using       forceps and sent for histopathology examination. Four samples were       obtained. Transbronchial biopsies were performed in the anterior medial       segment of the left lower lobe and in the lateral basal segment of the       left lower lobe using forceps and sent for histopathology examination.       The procedure was guided by fluoroscopy. Transbronchial biopsy technique       was selected because the sampling site was not accessible using standard       endoscopic (bronchoscopic) techniques. Six biopsy passes were performed.       Six biopsy samples were obtained.      An endobronchial ultrasound endoscope was utilized in order to assist       with fine needle aspiration in the left paratracheal area. In the aortic       arch there was a cluster of small nodes, all < 10m in size. There was no       subcarinal or 4R lymph node visualized. There was a 1.2 cm 4L lymph node       seen and this was biopsied with fine needle aspiration: 5 total passes       were taken, 2 for cytology, 3 for cell block.      Bronchoalveolar lavage was performed in  the left lower lobe of the lung       and sent for routine cytology. 40 mL of fluid were instilled. 21 mL were       returned. The return was cloudy.  There were no mucoid plugs in the       return fluid. Impression:            - Left lower lobe mass                        - Mediastinal adenopathy                        - The airway examination of the right lung was normal.                        - Nodular mucosa was visualized in the left lower lobe.                        - Nodular mucosa was visualized in the left upper lobe.                        - Brushings were obtained.                        - An endobronchial biopsy was performed.                        - Transbronchial lung biopsies were performed.                        - Endobronchial ultrasound was performed. Moderate Sedation:      General Anesthesia Recommendation:        - Await BAL, biopsy and cytology results. Procedure Code(s):     --- Professional ---                        [phone], Bronchoscopy, rigid or flexible, including                         fluoroscopic guidance, when performed; with                         transbronchial lung biopsy(s), single lobe                        32549, 59, Bronchoscopy, rigid or flexible, including                         fluoroscopic guidance, when performed; with bronchial                         or endobronchial biopsy(s), single or multiple sites                        82641, Bronchoscopy, rigid or flexible, including                         fluoroscopic guidance, when performed; with brushing  or protected brushings                        54008, Bronchoscopy, rigid or flexible, including                         fluoroscopic guidance, when performed; with                         transendoscopic endobronchial ultrasound (EBUS) during                         bronchoscopic diagnostic or therapeutic                         intervention(s) for peripheral lesion(s) (List                         separately in addition to code for primary                         procedure[s]) Diagnosis Code(s):     ---  Professional ---                        R91.8, Other nonspecific abnormal finding of lung field                        R59.0, Localized enlarged lymph nodes                        J98.4, Other disorders of lung CPT copyright 2019 American Medical Association. All rights reserved. The codes documented in this report are preliminary and upon coder review may  be revised to meet current compliance requirements. Norlene Campbell, MD Juanito Doom, MD 12/16/2020 1:22:42 PM This report has been signed electronically. Number of Addenda: 0

## 2020-12-16 NOTE — Anesthesia Procedure Notes (Signed)
Procedure Name: Intubation Date/Time: 12/16/2020 12:24 PM Performed by: Babs Bertin, CRNA Pre-anesthesia Checklist: Patient identified, Emergency Drugs available, Suction available and Patient being monitored Patient Re-evaluated:Patient Re-evaluated prior to induction Oxygen Delivery Method: Circle system utilized Preoxygenation: Pre-oxygenation with 100% oxygen Induction Type: IV induction Ventilation: Mask ventilation without difficulty Laryngoscope Size: Mac and 4 Grade View: Grade I Tube type: Oral Tube size: 8.5 mm Number of attempts: 1 Airway Equipment and Method: Stylet and Oral airway Placement Confirmation: ETT inserted through vocal cords under direct vision,  positive ETCO2 and breath sounds checked- equal and bilateral Secured at: 22 cm Tube secured with: Tape Dental Injury: Teeth and Oropharynx as per pre-operative assessment

## 2020-12-16 NOTE — Anesthesia Postprocedure Evaluation (Signed)
Anesthesia Post Note  Patient: ARMANII PRESSNELL  Procedure(s) Performed: VIDEO BRONCHOSCOPY WITH ENDOBRONCHIAL ULTRASOUND (Left ) BRONCHIAL BRUSHINGS BRONCHIAL BIOPSIES FINE NEEDLE ASPIRATION (FNA) LINEAR     Patient location during evaluation: PACU Anesthesia Type: General Level of consciousness: awake and alert Pain management: pain level controlled Vital Signs Assessment: post-procedure vital signs reviewed and stable Respiratory status: spontaneous breathing, nonlabored ventilation, respiratory function stable and patient connected to nasal cannula oxygen Cardiovascular status: blood pressure returned to baseline and stable Postop Assessment: no apparent nausea or vomiting Anesthetic complications: no   No complications documented.  Last Vitals:  Vitals:   12/16/20 1345 12/16/20 1356  BP: 124/73 140/70  Pulse: 80 85  Resp: (!) 24 (!) 21  Temp:    SpO2: 98% 97%    Last Pain:  Vitals:   12/16/20 0826  TempSrc: Oral  PainSc:                  Claude Waldman,W. EDMOND

## 2020-12-16 NOTE — Anesthesia Preprocedure Evaluation (Addendum)
Anesthesia Evaluation  Patient identified by MRN, date of birth, ID band Patient awake    Reviewed: Allergy & Precautions, H&P , NPO status , Patient's Chart, lab work & pertinent test results  Airway Mallampati: II  TM Distance: >3 FB Neck ROM: Full    Dental no notable dental hx. (+) Teeth Intact, Dental Advisory Given   Pulmonary neg pulmonary ROS, former smoker,    Pulmonary exam normal breath sounds clear to auscultation       Cardiovascular + dysrhythmias Atrial Fibrillation + pacemaker  Rhythm:Regular Rate:Normal     Neuro/Psych negative neurological ROS  negative psych ROS   GI/Hepatic negative GI ROS, Neg liver ROS,   Endo/Other  negative endocrine ROS  Renal/GU negative Renal ROS  negative genitourinary   Musculoskeletal  (+) Arthritis , Osteoarthritis,    Abdominal   Peds  Hematology  (+) Blood dyscrasia, anemia ,   Anesthesia Other Findings   Reproductive/Obstetrics negative OB ROS                            Anesthesia Physical Anesthesia Plan  ASA: III  Anesthesia Plan: General   Post-op Pain Management:    Induction: Intravenous  PONV Risk Score and Plan: 3 and Ondansetron, Dexamethasone and Treatment may vary due to age or medical condition  Airway Management Planned: Oral ETT  Additional Equipment:   Intra-op Plan:   Post-operative Plan: Extubation in OR  Informed Consent: I have reviewed the patients History and Physical, chart, labs and discussed the procedure including the risks, benefits and alternatives for the proposed anesthesia with the patient or authorized representative who has indicated his/her understanding and acceptance.     Dental advisory given  Plan Discussed with: CRNA  Anesthesia Plan Comments:         Anesthesia Quick Evaluation

## 2020-12-16 NOTE — Care Management Important Message (Signed)
Important Message  Patient Details  Name: Ricky [name] MRN: [id number] Date of Birth: 04/28/1931   Medicare Important Message Given:  Yes     [name] Montine Circle 12/16/2020, 4:35 PM

## 2020-12-16 NOTE — H&P (Signed)
[facility] P[facility]M  CC: fall HPI: 87 y/o male admitted with a fall for the second time in a several weeks.  He was found to have a large left lower lobe mass.  PCCM consulted for biopsy.  The patient is confused and unable to have a conversation about this situation.  His son was informed of the risks and benefits and gave consent for a biopsy.  Former smoker, Building control surveyor.  Past Medical History:  Diagnosis Date  . A-fib ([place])   . Anemia   . Anemia of chronic disease 02/02/2020  . Atrial fibrillation ([place])   . Boerhaave's syndrome   . Chronic anticoagulation 01/22/2020  . Colon polyps   . COVID-19   . Hx of colonic polyps 01/22/2020   2017 - [place]  . Hypomagnesemia   . Low grade myelodysplastic syndrome lesions ([place]) 02/02/2020  . Lower urinary tract symptoms due to benign prostatic hyperplasia   . Pacemaker   . Paroxysmal atrial fibrillation ([facility])   . Prostate enlargement   . Symptomatic anemia 12/26/2019  . Vitamin D deficiency      Family History  Problem Relation Age of Onset  . Cirrhosis Mother   . Alcoholism Mother   . Heart attack Father 5  . Heart disease Brother   . Diabetes Brother   . Brain cancer Daughter      Social History   Socioeconomic History  . Marital status: Married    Spouse name: Not on file  . Number of children: 2  . Years of education: Not on file  . Highest education level: Not on file  Occupational History  . Occupation: retired  Tobacco Use  . Smoking status: Former Smoker    Types: Cigarettes    Quit date: 01/21/1970    Years since quitting: 50.9  . Smokeless tobacco: Never Used  Vaping Use  . Vaping Use: Never used  Substance and Sexual Activity  . Alcohol use: Not Currently  . Drug use: Never  . Sexual activity: Yes  Other Topics Concern  . Not on file  Social History Narrative  . Not on file   Social Determinants of Health   Financial Resource Strain: Not on file  Food Insecurity: Not on file  Transportation Needs: Not on file  Physical  Activity: Not on file  Stress: Not on file  Social Connections: Not on file  Intimate Partner Violence: Not on file     No Known Allergies   @[email or web address] @  Vitals:   12/15/20 2036 12/16/20 0016 12/16/20 0416 12/16/20 0826  BP: 132/65 122/82 131/66 124/64  Pulse: 80 83 78 80  Resp: 20 20 20 18   Temp: 98 F (36.7 C) 98.6 F (37 C) 97.9 F (36.6 C) (!) 97.4 F (36.3 C)  TempSrc: Oral Axillary Oral Oral  SpO2: 97% 100% 98% 98%  Weight:      Height:       General:  Resting comfortably in bed HENT: NCAT OP clear PULM: CTA B, normal effort CV: RRR, no mgr GI: BS+, soft, nontender MSK: normal bulk and tone Neuro: confused, MAEW  CBC    Component Value Date/Time   WBC 12.1 (H) 12/16/2020 0507   RBC 2.69 (L) 12/16/2020 0507   HGB 7.8 (L) 12/16/2020 0507   HGB 7.3 (L) 12/05/2020 0820   HCT 23.8 (L) 12/16/2020 0507   PLT 161 12/16/2020 0507   PLT 286 12/05/2020 0820   MCV 88.5 12/16/2020 0507   MCH 29.0 12/16/2020 0507   MCHC 32.8  12/16/2020 0507   RDW 17.4 (H) 12/16/2020 0507   LYMPHSABS 1.4 12/14/2020 0928   MONOABS 1.2 (H) 12/14/2020 0928   EOSABS 0.0 12/14/2020 0928   BASOSABS 0.2 (H) 12/14/2020 0928   BMET    Component Value Date/Time   NA 130 (L) 12/13/2020 0130   NA 133 (L) 12/09/2020 1506   K 4.1 12/13/2020 0130   CL 97 (L) 12/13/2020 0130   CO2 25 12/13/2020 0130   GLUCOSE 110 (H) 12/13/2020 0130   BUN 23 12/13/2020 0130   BUN 12 12/09/2020 1506   CREATININE 1.12 12/13/2020 0130   CREATININE 1.07 09/16/2020 1210   CALCIUM 7.9 (L) 12/13/2020 0130   GFRNONAA >60 12/13/2020 0130   GFRNONAA >60 09/16/2020 1210   GFRAA 62 06/03/2020 0835   GFRAA >60 03/28/2020 0919    CT chest images reviewed: cluster of nodes in the 4L area, large vascular left lower lobe mass  Impression/Plan: Left lower lobe mass and medaistinal adenopathy in a former smoker; case complicated by deconditioning, dementia: I discussed with the patient's son at length by phone this  morning.  He understands the risks and consents for biopsy.  Have also discussed with my partners.  Risk of bleeding is not trivial.  Will proceed with endobronchial inspection, brushing and biopsy followed by EBUS.    Roselie Awkward, MD [place] [facility] Pager: [phone] Cell: [phone] If no response, please call [phone] until 7pm After 7:00 pm call [place]  [phone]

## 2020-12-16 NOTE — Progress Notes (Signed)
Spoke to [place] at [place] concerning patients pacemaker for surgery. Aaron Edelman stated that nothing needed to be done to the pacemaker prior to his procedure in Endo. Will inform anesthesia as well.

## 2020-12-16 NOTE — Transfer of Care (Signed)
Immediate Anesthesia Transfer of Care Note  Patient: Ricky [name]  Procedure(s) Performed: VIDEO BRONCHOSCOPY WITH ENDOBRONCHIAL ULTRASOUND (Left ) BRONCHIAL BRUSHINGS BRONCHIAL BIOPSIES FINE NEEDLE ASPIRATION (FNA) LINEAR  Patient Location: PACU  Anesthesia Type:General  Level of Consciousness: drowsy and patient cooperative  Airway & Oxygen Therapy: Patient Spontanous Breathing and Patient connected to nasal cannula oxygen  Post-op Assessment: Report given to RN, Post -op Vital signs reviewed and stable and Patient moving all extremities X 4  Post vital signs: Reviewed and stable  Last Vitals:  Vitals Value Taken Time  BP 131/69 12/16/20 1326  Temp    Pulse 90 12/16/20 1327  Resp 23 12/16/20 1327  SpO2 96 % 12/16/20 1327  Vitals shown include unvalidated device data.  Last Pain:  Vitals:   12/16/20 0826  TempSrc: Oral  PainSc:          Complications: No complications documented.

## 2020-12-16 NOTE — Progress Notes (Signed)
[facility] PROGRESS NOTE    Progress Note  Ricky [name]  FWY:[id number] DOB: [birth year] DOA: 12/12/2020 PCP: Roetta Sessions, NP     Brief Narrative:   Ricky [name] is an 87 y.o. male past medical history of Miller dysplastic syndrome, paroxysmal atrial fibrillation on Eliquis systolic heart failure Boerhaave syndrome presented to the ED for recurrent falls, he was recently seen in the ED with rib fractures for mechanical fall, incidentally was also found to have a large necrotic appearing lung mass in the left lower lobe concerning for bronchogenic carcinoma, son and patient denies any loss of consciousness no loss of sphincter control in the ED they noticed left arm weakness and a facial droop,    Significant studies: 12/13/2020 CT of the head streak and beam hardening artifact arising from the dental restoration partially obscured occipital lobe and obscured posterior fossa, chronic right basal ganglia lacunar infarct 12/13/2020 CT angio of the neck showed 2 mm vascular protrusion arising for the paraclinoid right ICA, bilateral caloric artery without significant stenosis. 12/13/2020 2 CT angio of the head no intracranial large vessel occlusions.  Antibiotics: 12/12/2020 Rocephin and azithromycin  Microbiology data: Blood culture:  Procedures: None  Assessment/Plan:   Acute metabolic encephalopathy: The most likely etiology likely due to infectious process. He was started empirically on antibiotics and IV fluids. Neurology was consulted recommended stroke work-up multiple imaging were negative for CVA.  Cannot do an MRI due to his pacer. Left shoulder x-ray showed no acute findings. With a new left lower lobe infiltrate on chest x-ray today mild leukocytosis tachycardic encephalopathic. Blood cultures obtained.  Severe sepsis due to postobstructive pneumonia present on admission: Started empirically on Rocephin and azithromycin. He has remained  afebrile, has been weaned to room air. Leukocytosis improving. Blood cultures have remained negative till date. Continue to hold Eliquis until after bronchoscopy.  Normocytic anemia: There is likely hemoconcentration there is no signs of overt bleeding. His Eliquis was held, as he is going for bronchoscopy today on 12/17/2018 Status post 1 units packed red blood cells, CBC posttransfusion was also hemoglobin of 7.8.  Necrotic mass of the left lower lobe: Incidentally diagnosed on previous visits to the ED. Contact pulmonary for bronchoscopy on 09/17/2021. Hold Eliquis till then.  Acute gout flare: X-ray of the right hand and wrist show no swelling degenerative changes. Hand is warm to touch extremely tender to light touch. Continue steroids.  Fall at home, initial encounter There was a report of left upper extremity weakness and facial droop concern by the ED MRI cannot be performed due to pacemaker neurology was consulted recommended admission and monitor with serial neurological examination and TIA work-up. Eliquis held for biopsy, bronchoscopy on Monday. CT scan of the head no acute CVA. Evaluated the patient recommended skilled nursing facility.  Paroxysmal atrial fibrillation: Currently rate controlled in sinus rhythm. Hold Eliquis for bronchoscopy.  Myelodysplastic syndrome: Follow-up with Dr. Lindi Adie as an outpatient.  Coronary artery disease involving the native arteries: Currently chest pain-free continue current home regimen.  Chronic diastolic heart failure: Slightly hypervolemic on admission. Diuretic therapy was held he was started on IV fluid hydration     DVT prophylaxis: Lovenox Family Communication: Son Status is: Observation  He will need more than 2 days admit to inpatient.  Dispo: The patient is from: Home              Anticipated d/c is to: SNF  Patient currently is not medically stable to d/c.   Difficult to place patient No   Code  Status:     Code Status Orders  (From admission, onward)         Start     Ordered   12/13/20 0136  Full code  Continuous        12/13/20 0135        Code Status History    Date Active Date Inactive Code Status Order ID Comments User Context   11/22/2020 2354 11/23/2020 2326 Full Code [id number]  Elwyn Reach, MD ED   12/26/2019 1844 12/27/2019 2316 Full Code [id number]  Alma Friendly, MD ED   Advance Care Planning Activity        IV Access:    Peripheral IV   Procedures and diagnostic studies:   DG Wrist 2 Views Right  Result Date: 12/14/2020 CLINICAL DATA:  Recent fall with wrist pain and swelling, initial encounter EXAM: RIGHT WRIST - 2 VIEW COMPARISON:  None. FINDINGS: No acute fracture or dislocation is noted. Degenerative changes of the radiocarpal joint are seen. Diffuse vascular calcifications are noted as well. No other focal abnormality is seen. IMPRESSION: Degenerative change without acute abnormality. Electronically Signed   By: Inez Catalina M.D.   On: 12/14/2020 20:40   DG Hand 2 View Right  Result Date: 12/14/2020 CLINICAL DATA:  Recent fall with hand pain, initial encounter EXAM: RIGHT HAND - 2 VIEW COMPARISON:  None. FINDINGS: No acute fracture or dislocation is noted. No soft tissue abnormality is seen. Degenerative changes of the radiocarpal joint are seen. IMPRESSION: No acute abnormality noted.  Mild degenerative changes seen. Electronically Signed   By: Inez Catalina M.D.   On: 12/14/2020 20:44     Medical Consultants:    None.   Subjective:    Ricky [name] he relates his hand pain is better but still continues to hurt..  Objective:    Vitals:   12/15/20 2036 12/16/20 0016 12/16/20 0416 12/16/20 0826  BP: 132/65 122/82 131/66 124/64  Pulse: 80 83 78 80  Resp: 20 20 20 18   Temp: 98 F (36.7 C) 98.6 F (37 C) 97.9 F (36.6 C) (!) 97.4 F (36.3 C)  TempSrc: Oral Axillary Oral Oral  SpO2: 97% 100% 98% 98%  Weight:       Height:       SpO2: 98 % O2 Flow Rate (L/min): 2 L/min   Intake/Output Summary (Last 24 hours) at 12/16/2020 0951 Last data filed at 12/16/2020 0400 Gross per 24 hour  Intake 1150 ml  Output 1800 ml  Net -650 ml   Filed Weights   12/13/20 1939  Weight: 89.8 kg    Exam: General exam: In no acute distress. Respiratory system: Good air movement and clear to auscultation. Cardiovascular system: S1 & S2 heard, RRR. No JVD. Gastrointestinal system: Abdomen is nondistended, soft and nontender.  Extremities: Right hand is extremely swollen from the wrist and fingers, wrist and fingers are warm to touch and exquisitely tender to light palpation Skin: No rashes, lesions or ulcers Psychiatry: Judgement and insight appear normal. Mood & affect appropriate.   Data Reviewed:    Labs: Basic Metabolic Panel: Recent Labs  Lab 12/09/20 1506 12/10/20 1131 12/12/20 1800 12/12/20 2017 12/13/20 0130  NA 133* 136 131* 133* 130*  K 4.2 3.8 4.0 4.0 4.1  CL 98 103 96* 95* 97*  CO2 22 27 26   --  25  GLUCOSE 105* 118*  132* 125* 110*  BUN 12 11 22  27* 23  CREATININE 0.97 0.90 1.39* 1.20 1.12  CALCIUM 7.7* 8.2* 8.5*  --  7.9*  MG  --   --   --   --  2.3   GFR Estimated Creatinine Clearance: 51 mL/min (by C-G formula based on SCr of 1.12 mg/dL). Liver Function Tests: Recent Labs  Lab 12/12/20 1800 12/13/20 0130  AST 27 26  ALT 57* 47*  ALKPHOS 109 85  BILITOT 1.1 1.5*  PROT 8.5* 7.1  ALBUMIN 2.9* 2.4*   No results for input(s): LIPASE, AMYLASE in the last 168 hours. No results for input(s): AMMONIA in the last 168 hours. Coagulation profile Recent Labs  Lab 12/12/20 1913  INR 1.7*   COVID-19 Labs  No results for input(s): DDIMER, FERRITIN, LDH, CRP in the last 72 hours.  Lab Results  Component Value Date   SARSCOV2NAA NEGATIVE 12/12/2020   SARSCOV2NAA NEGATIVE 11/22/2020   SARSCOV2NAA POSITIVE (A) 12/26/2019    CBC: Recent Labs  Lab 12/12/20 1800 12/12/20 2017  12/13/20 0136 12/14/20 0928 12/15/20 0156 12/15/20 2117 12/16/20 0507  WBC 22.7*  --  16.4* 17.4* 15.7* 14.1* 12.1*  NEUTROABS 18.6*  --  13.0* 14.6*  --   --   --   HGB 8.5*   < > 7.2* 7.0* 6.6* 7.8* 7.8*  HCT 27.2*   < > 22.7* 20.7* 20.2* 24.2* 23.8*  MCV 91.6  --  90.4 87.0 89.4 88.3 88.5  PLT 277  --  188 237 185 167 161   < > = values in this interval not displayed.   Cardiac Enzymes: No results for input(s): CKTOTAL, CKMB, CKMBINDEX, TROPONINI in the last 168 hours. BNP (last 3 results) No results for input(s): PROBNP in the last 8760 hours. CBG: No results for input(s): GLUCAP in the last 168 hours. D-Dimer: No results for input(s): DDIMER in the last 72 hours. Hgb A1c: No results for input(s): HGBA1C in the last 72 hours. Lipid Profile: No results for input(s): CHOL, HDL, LDLCALC, TRIG, CHOLHDL, LDLDIRECT in the last 72 hours. Thyroid function studies: No results for input(s): TSH, T4TOTAL, T3FREE, THYROIDAB in the last 72 hours.  Invalid input(s): FREET3 Anemia work up: No results for input(s): VITAMINB12, FOLATE, FERRITIN, TIBC, IRON, RETICCTPCT in the last 72 hours. Sepsis Labs: Recent Labs  Lab 12/12/20 1800 12/13/20 0136 12/13/20 0215 12/14/20 0928 12/15/20 0156 12/15/20 2117 12/16/20 0507  PROCALCITON  --   --  0.55  --   --   --   --   WBC 22.7*   < >  --  17.4* 15.7* 14.1* 12.1*  LATICACIDVEN 1.4  --   --   --   --   --   --    < > = values in this interval not displayed.   Microbiology Recent Results (from the past 240 hour(s))  Resp Panel by RT-PCR (Flu A&B, Covid) Nasopharyngeal Swab     Status: None   Collection Time: 12/12/20  7:13 PM   Specimen: Nasopharyngeal Swab; Nasopharyngeal(NP) swabs in vial transport medium  Result Value Ref Range Status   SARS Coronavirus 2 by RT PCR NEGATIVE NEGATIVE Final    Comment: (NOTE) SARS-CoV-2 target nucleic acids are NOT DETECTED.  The SARS-CoV-2 RNA is generally detectable in upper  respiratory specimens during the acute phase of infection. The lowest concentration of SARS-CoV-2 viral copies this assay can detect is 138 copies/mL. A negative result does not preclude SARS-Cov-2 infection and should not  be used as the sole basis for treatment or other patient management decisions. A negative result may occur with  improper specimen collection/handling, submission of specimen other than nasopharyngeal swab, presence of viral mutation(s) within the areas targeted by this assay, and inadequate number of viral copies(<138 copies/mL). A negative result must be combined with clinical observations, patient history, and epidemiological information. The expected result is Negative.  Fact Sheet for Patients:  EntrepreneurPulse.com.au  Fact Sheet for Healthcare Providers:  IncredibleEmployment.be  This test is no t yet approved or cleared by the [place] FDA and  has been authorized for detection and/or diagnosis of SARS-CoV-2 by FDA under an Emergency Use Authorization (EUA). This EUA will remain  in effect (meaning this test can be used) for the duration of the COVID-19 declaration under Section 564(b)(1) of the Act, 21 U.S.C.section 360bbb-3(b)(1), unless the authorization is terminated  or revoked sooner.       Influenza A by PCR NEGATIVE NEGATIVE Final   Influenza B by PCR NEGATIVE NEGATIVE Final    Comment: (NOTE) The Xpert Xpress SARS-CoV-2/FLU/RSV plus assay is intended as an aid in the diagnosis of influenza from Nasopharyngeal swab specimens and should not be used as a sole basis for treatment. Nasal washings and aspirates are unacceptable for Xpert Xpress SARS-CoV-2/FLU/RSV testing.  Fact Sheet for Patients: EntrepreneurPulse.com.au  Fact Sheet for Healthcare Providers: IncredibleEmployment.be  This test is not yet approved or cleared by the [place] FDA and has been  authorized for detection and/or diagnosis of SARS-CoV-2 by FDA under an Emergency Use Authorization (EUA). This EUA will remain in effect (meaning this test can be used) for the duration of the COVID-19 declaration under Section 564(b)(1) of the Act, 21 U.S.C. section 360bbb-3(b)(1), unless the authorization is terminated or revoked.  Performed at [facility], [place] [address] [place], [place] [address]   Culture, blood (routine x 2)     Status: None (Preliminary result)   Collection Time: 12/13/20  4:16 AM   Specimen: BLOOD  Result Value Ref Range Status   Specimen Description BLOOD SITE NOT SPECIFIED  Final   Special Requests   Final    BOTTLES DRAWN AEROBIC AND ANAEROBIC Blood Culture adequate volume   Culture   Final    NO GROWTH 3 DAYS Performed at [facility], [address] [place], [place] [address]    Report Status PENDING  Incomplete  Culture, blood (routine x 2)     Status: None (Preliminary result)   Collection Time: 12/13/20  4:17 AM   Specimen: BLOOD  Result Value Ref Range Status   Specimen Description BLOOD SITE NOT SPECIFIED  Final   Special Requests   Final    BOTTLES DRAWN AEROBIC AND ANAEROBIC Blood Culture results may not be optimal due to an inadequate volume of blood received in culture bottles   Culture   Final    NO GROWTH 3 DAYS Performed at [facility], [place] [address], [place],  [address]    Report Status PENDING  Incomplete     Medications:   . sodium chloride   Intravenous Once  . aspirin  81 mg Oral Daily  . chlorhexidine      . Gerhardt's butt cream   Topical BID  . melatonin  3 mg Oral QHS  . polyethylene glycol  17 g Oral Daily  . predniSONE  40 mg Oral Q breakfast  . simvastatin  20 mg Oral Daily  . torsemide  20 mg Oral Daily  . cyanocobalamin  1,000 mcg Oral Daily   Continuous Infusions: . azithromycin 500 mg (12/15/20 2237)  . cefTRIAXone (ROCEPHIN)  IV 2 g (12/15/20 2125)      LOS: 3 days    Charlynne Cousins  [facility]  12/16/2020, 9:51 AM

## 2020-12-17 ENCOUNTER — Inpatient Hospital Stay (HOSPITAL_COMMUNITY): Payer: Medicare Other

## 2020-12-17 ENCOUNTER — Other Ambulatory Visit: Payer: Self-pay | Admitting: *Deleted

## 2020-12-17 ENCOUNTER — Encounter (HOSPITAL_COMMUNITY): Payer: Self-pay | Admitting: Pulmonary Disease

## 2020-12-17 DIAGNOSIS — G459 Transient cerebral ischemic attack, unspecified: Secondary | ICD-10-CM | POA: Diagnosis not present

## 2020-12-17 DIAGNOSIS — J189 Pneumonia, unspecified organism: Secondary | ICD-10-CM | POA: Diagnosis not present

## 2020-12-17 DIAGNOSIS — D72829 Elevated white blood cell count, unspecified: Secondary | ICD-10-CM | POA: Diagnosis not present

## 2020-12-17 DIAGNOSIS — I5032 Chronic diastolic (congestive) heart failure: Secondary | ICD-10-CM

## 2020-12-17 DIAGNOSIS — D638 Anemia in other chronic diseases classified elsewhere: Secondary | ICD-10-CM

## 2020-12-17 DIAGNOSIS — R918 Other nonspecific abnormal finding of lung field: Secondary | ICD-10-CM | POA: Diagnosis not present

## 2020-12-17 DIAGNOSIS — A419 Sepsis, unspecified organism: Secondary | ICD-10-CM | POA: Diagnosis not present

## 2020-12-17 LAB — TYPE AND SCREEN
ABO/RH(D): B POS
Antibody Screen: POSITIVE
DAT, IgG: POSITIVE
Donor AG Type: NEGATIVE
Donor AG Type: NEGATIVE
Unit division: 0
Unit division: 0

## 2020-12-17 LAB — BPAM RBC
Blood Product Expiration Date: 202204252359
Blood Product Expiration Date: 202205042359
ISSUE DATE / TIME: 202203271430
ISSUE DATE / TIME: 202203281200
Unit Type and Rh: 5100
Unit Type and Rh: 7300

## 2020-12-17 LAB — SARS CORONAVIRUS 2 (TAT 6-24 HRS): SARS Coronavirus 2: NEGATIVE

## 2020-12-17 LAB — CYTOLOGY - NON PAP

## 2020-12-17 MED ORDER — PREDNISONE 20 MG PO TABS: 40.0000 mg | ORAL_TABLET | Freq: Every day | ORAL | 0 refills | Status: AC

## 2020-12-17 MED ORDER — RESOURCE THICKENUP CLEAR PO POWD
ORAL | Status: DC | PRN
  Filled 2020-12-17 (×2): qty 125

## 2020-12-17 MED ORDER — APIXABAN 5 MG PO TABS
5.0000 mg | ORAL_TABLET | Freq: Two times a day (BID) | ORAL | Status: DC
  Administered 2020-12-17: 5 mg via ORAL
  Filled 2020-12-17: qty 1

## 2020-12-17 NOTE — Progress Notes (Signed)
[facility]  Awaiting path report  Roselie Awkward, MD [facility] Pager: [phone] Cell: [phone] If no response, please call [phone] until 7pm After 7:00 pm call Elink  [phone]

## 2020-12-17 NOTE — TOC Transition Note (Signed)
Transition of Care [facility]) - CM/SW Discharge Note   Patient Details  Name: Ricky [name] MRN: [id number] Date of Birth: [birth year]  Transition of [facility]) CM/SW Contact:  Marney Setting, [place] Work Phone Number: 12/17/2020, 2:16 PM   Clinical Narrative:   Nurse to call report to [phone], Rm. [phone]    Final next level of care: Skilled Nursing Facility Barriers to Discharge: No Barriers Identified   Patient Goals and CMS Choice Patient states their goals for this hospitalization and ongoing recovery are:: Get better soon CMS Medicare.gov Compare Post Acute Care list provided to:: Patient Represenative (must comment) (Son, Jacobus) Choice offered to / list presented to : Adult Children  Discharge Placement              Patient [facility] bed at: [facility] Patient to be transferred to facility by: [place] Name of family member notified: Ed Patient and family notified of of transfer: 12/17/20  Discharge Plan and Services In-house Referral: Clinical Social Work                                   Social Determinants of Health (SDOH) Interventions     Readmission Risk Interventions No flowsheet data found.

## 2020-12-17 NOTE — Progress Notes (Signed)
Patient transported to other facility per [facility]. VSS. No needs stated. Belongings sent. Patient placed on oxygen. Patient's son, Ed notified of patient's departure via voicemail (phone went straight to voicemail).

## 2020-12-17 NOTE — Progress Notes (Signed)
Physical Therapy Treatment Patient Details Name: Ricky [name] MRN: [id number] DOB: 05/15/1931 Today's Date: 12/17/2020    History of Present Illness 87 y.o. male presented to the ED for recurrent falls on 3/24. He was recently seen in the ED with rib fractures for mechanical fall, incidentally was also found to have a large necrotic appearing lung mass in the left lower lobe concerning for bronchogenic carcinoma. Report of left arm weakness and a facial droop, Acute metabolic encephalopathy. CT of the head showed no acute findings. Past medical history of Miller dysplastic syndrome, paroxysmal atrial fibrillation on Eliquis, systolic heart failure, and Boerhaave syndrome.    PT Comments    Pt less anxious today during therapy session, following one step commands. Mod A to come to EOB with close supervision to maintain sitting. Pt performed sit>stand 2x with max A +2 for power up and with feet and knees blocked. Pt wanting to sit in recliner but transport present to take him to [place]. PT will continue to follow.   Follow Up Recommendations  SNF;Supervision/Assistance - 24 hour     Equipment Recommendations  Wheelchair cushion (measurements PT);Wheelchair (measurements PT)    Recommendations for Other Services       Precautions / Restrictions Precautions Precautions: Fall Precaution Comments: Has had multiple falls at home. Restrictions Weight Bearing Restrictions: No Other Position/Activity Restrictions: very HOH    Mobility  Bed Mobility Overal bed mobility: Needs Assistance Bed Mobility: Supine to Sit;Sit to Supine     Supine to sit: +2 for physical assistance;Max assist Sit to supine: Total assist;+2 for physical assistance   General bed mobility comments: max A for LE's to EOB, pt using UE's to assist with trunk, mod A given at trunk. Tot A to return to supine.    Transfers Overall transfer level: Needs assistance Equipment used: 2 person hand held  assist Transfers: Sit to/from Stand Sit to Stand: Max assist;+2 physical assistance         General transfer comment: max A +2 for power up to standing with feet and knees blocked. Pt performed 2 reps, maintained posterior lean against bed  Ambulation/Gait             General Gait Details: unable   Stairs             Wheelchair Mobility    Modified Rankin (Stroke Patients Only)       Balance Overall balance assessment: History of Falls;Needs assistance Sitting-balance support: Feet supported Sitting balance-Leahy Scale: Fair Sitting balance - Comments: able to maintain sitting EOB with close supervision, unable to accept challenge                                    Cognition Arousal/Alertness: Awake/alert Behavior During Therapy: Flat affect Overall Cognitive Status: No family/caregiver present to determine baseline cognitive functioning Area of Impairment: Attention;Memory;Following commands;Safety/judgement;Awareness;Problem solving                 Orientation Level: Disoriented to;Place;Time;Situation Current Attention Level: Sustained Memory: Decreased short-term memory Following Commands: Follows one step commands consistently;Follows one step commands with increased time Safety/Judgement: Decreased awareness of deficits;Decreased awareness of safety Awareness: Emergent Problem Solving: Slow processing;Decreased initiation;Difficulty sequencing;Requires verbal cues;Requires tactile cues General Comments: pt less anxious today      Exercises      General Comments General comments (skin integrity, edema, etc.): Pt agreeable to getting in recliner but transport arrived  to take pt to [facility] so returned to bed      Pertinent Vitals/Pain Pain Assessment: Faces Faces Pain Scale: Hurts little more Pain Location: R hand (swollen) Pain Descriptors / Indicators: Discomfort Pain Intervention(s): Repositioned (retrograde massage with  lotion)    Home Living                      Prior Function            PT Goals (current goals can now be found in the care plan section) Acute Rehab PT Goals Patient Stated Goal: none stated PT Goal Formulation: With patient Time For Goal Achievement: 12/27/20 Potential to Achieve Goals: Good Progress towards PT goals: Progressing toward goals    Frequency    Min 2X/week      PT Plan Current plan remains appropriate    Co-evaluation              AM-PAC PT "6 Clicks" Mobility   Outcome Measure  Help needed turning from your back to your side while in a flat bed without using bedrails?: A Lot Help needed moving from lying on your back to sitting on the side of a flat bed without using bedrails?: A Lot Help needed moving to and from a bed to a chair (including a wheelchair)?: A Lot Help needed standing up from a chair using your arms (e.g., wheelchair or bedside chair)?: A Lot Help needed to walk in hospital room?: Total Help needed climbing 3-5 steps with a railing? : Total 6 Click Score: 10    End of Session Equipment Utilized During Treatment: Oxygen Activity Tolerance: Patient tolerated treatment well;Patient limited by fatigue Patient left: in bed;with call bell/phone within reach (transporter taking pt) Nurse Communication: Mobility status PT Visit Diagnosis: Unsteadiness on feet (R26.81);Muscle weakness (generalized) (M62.81);History of falling (Z91.81);Repeated falls (R29.6)     Time: 2595-6387 PT Time Calculation (min) (ACUTE ONLY): 15 min  Charges:  $Therapeutic Activity: 8-22 mins                     Leighton Roach, PT  Acute Rehab Services  Pager [phone] Office [place] 12/17/2020, 12:36 PM

## 2020-12-17 NOTE — Discharge Summary (Addendum)
Physician Discharge Summary  Ricky [name] CBS:[id number] DOB: 09-16-1931 DOA: 12/12/2020  PCP: Roetta Sessions, NP  Admit date: 12/12/2020 Discharge date: 12/17/2020  Admitted From: SNF Disposition:  SNF  Recommendations for Outpatient Follow-up:  1. Follow up with PCP in 1-2 weeks 2. Please obtain BMP/CBC in one week   Home Health:No Equipment/Devices:None  Discharge Condition:Stable CODE STATUS:full Diet recommendation: Dysphagia 2 diet  Brief/Interim Summary: 87 y.o. male past medical history of Miller dysplastic syndrome, paroxysmal atrial fibrillation on Eliquis systolic heart failure Boerhaave syndrome presented to the ED for recurrent falls, he was recently seen in the ED with rib fractures for mechanical fall, incidentally was also found to have a large necrotic appearing lung mass in the left lower lobe concerning for bronchogenic carcinoma, son and patient denies any loss of consciousness no loss of sphincter control in the ED they noticed left arm weakness and a facial droop,    Significant studies: 12/13/2020 CT of the head streak and beam hardening artifact arising from the dental restoration partially obscured occipital lobe and obscured posterior fossa, chronic right basal ganglia lacunar infarct 12/13/2020 CT angio of the neck showed 2 mm vascular protrusion arising for the paraclinoid right ICA, bilateral caloric artery without significant stenosis. 12/13/2020 2 CT angio of the head no intracranial large vessel occlusions.   Discharge Diagnoses:  Principal Problem:   Pneumonia of left lower lobe due to infectious organism Active Problems:   Paroxysmal atrial fibrillation (HCC)   Chronic anticoagulation   Myelodysplastic syndrome (HCC)   Coronary artery disease involving native coronary artery   Mixed hyperlipidemia   Acute metabolic encephalopathy   Fall at home, initial encounter   Mass of lower lobe of left lung   Chronic diastolic CHF  (congestive heart failure) ([place])   Acute metabolic encephalopathy: Likely due to infectious etiology now resolved. Neurology was consulted to perform a stroke work-up which was negative. He completed his course of antibiotics in house blood cultures remain negative.  Severe sepsis due to postobstructive pneumonia present on admission: He was started empirically on Rocephin and azithromycin he defervesced this is a encephalopathy resolved he completed his course in house.  Normocytic anemia: His Eliquis was held for bronchoscopy done on 11/13/2026, due to his mild drop in his hemoglobin he was transfused 1 unit of packed red blood cells his hemoglobin since then has remained stable. He could go back on Eliquis as an outpatient.  Necrotic mass of the left lower lobe: Incidentally noted on previous EGD. We contacted pulmonary who agreed to perform bronchoscopy on 09/17/2021. Which was done awaiting biopsy results, brushing and biopsy were sent for fungal cultures.  Acute gout flare: X-ray of the right hand and wrist show degenerative changes no effusions. He was started on steroids and his pain improved significantly he will continue steroids for 2 additional days.  Mechanical fall at home: There was a reported left upper extremity weakness and facial droop in the ED MRI cannot be performed due to his pacemaker. Neurology was consulted 2 CT scans were done which were negative.  Paroxysmal atrial fibrillation: Rate controlled continue Eliquis as an outpatient.  Myelodysplastic syndrome: Follow-up with Dr. Ladona Mow as an outpatient.  Coronary artery disease involving the native arteries: Noted.  Chronic diastolic heart failure: No changes made to his medication. Discharge Instructions  Discharge Instructions    Diet - low sodium heart healthy   Complete by: As directed    Increase activity slowly   Complete by: As directed  Allergies as of 12/17/2020   No Known Allergies      Medication List    STOP taking these medications   deferasirox 500 MG disintegrating tablet Commonly known as: Exjade     TAKE these medications   apixaban 5 MG Tabs tablet Commonly known as: Eliquis Take 1 tablet (5 mg total) by mouth 2 (two) times daily.   cholecalciferol 25 MCG (1000 UNIT) tablet Commonly known as: VITAMIN D3 Take 1,000 Units by mouth daily.   cyanocobalamin 1000 MCG tablet Take 1,000 mcg by mouth daily.   Magnesium 250 MG Tabs Take 1 tablet (250 mg total) by mouth daily.   predniSONE 20 MG tablet Commonly known as: DELTASONE Take 2 tablets (40 mg total) by mouth daily with breakfast for 3 days. Start taking on: December 18, 2020   simvastatin 20 MG tablet Commonly known as: ZOCOR Take 20 mg by mouth daily.   torsemide 20 MG tablet Commonly known as: DEMADEX Take 1 tablet (20 mg total) by mouth 2 (two) times daily.   traMADol 50 MG tablet Commonly known as: ULTRAM Take 1 tablet (50 mg total) by mouth every 12 (twelve) hours as needed.       No Known Allergies  Consultations:  Pulmonary and critical care  Neurology   Procedures/Studies: CT Angio Head W or Wo Contrast  Addendum Date: 12/13/2020   ADDENDUM REPORT: 12/13/2020 07:41 ADDENDUM: Finding omitted from the impression of the report: 2 mm vascular protrusion arising from the paraclinoid right ICA, which may reflect an aneurysm or infundibulum. These results will be called to the ordering clinician or representative by the Radiologist Assistant, and communication documented in the PACS or [employer]. Electronically Signed   By: Kellie Simmering DO   On: 12/13/2020 07:41   Result Date: 12/13/2020 CLINICAL DATA:  Neuro deficit, acute, stroke suspected. EXAM: CT ANGIOGRAPHY HEAD AND NECK TECHNIQUE: Multidetector CT imaging of the head and neck was performed using the standard protocol during bolus administration of intravenous contrast. Multiplanar CT image reconstructions and MIPs were  obtained to evaluate the vascular anatomy. Carotid stenosis measurements (when applicable) are obtained utilizing NASCET criteria, using the distal internal carotid diameter as the denominator. CONTRAST:  53mL OMNIPAQUE IOHEXOL 350 MG/ML SOLN COMPARISON:  No pertinent prior exams available for comparison. FINDINGS: CTA NECK FINDINGS Aortic arch: Standard aortic branching. Atherosclerotic plaque within the visualized aortic arch and proximal major branch vessels of the neck. No hemodynamically significant innominate or proximal subclavian artery stenosis. Right carotid system: CCA and ICA patent within the neck. Calcified plaque within the CCA, carotid bifurcation and proximal ICA without hemodynamically significant stenosis (50% or greater). Left carotid system: CCA and ICA patent within the neck. Calcified plaque within the CCA, carotid bifurcation and proximal ICA without hemodynamically significant stenosis (50% or greater). Vertebral arteries: Codominant and patent within the neck without stenosis. Skeleton: No acute bony abnormality or aggressive osseous lesion. Other neck: No neck mass or cervical lymphadenopathy. Thyroid unremarkable. Upper chest: Areas of scarring within the imaged lung apices. No consolidation within the imaged lung apices. Review of the MIP images confirms the above findings CTA HEAD FINDINGS Anterior circulation: The intracranial internal carotid arteries are patent. Calcified plaque within both vessels with no more than mild stenosis. The M1 middle cerebral arteries are patent. No M2 proximal branch occlusion or high-grade proximal stenosis is identified. The anterior cerebral arteries are patent. 2 mm vascular protrusion arising from the paraclinoid right ICA which may reflect an aneurysm or infundibulum (  series 9, image 94). Posterior circulation: The intracranial vertebral arteries are patent. Minimal nonstenotic calcified plaque within the V4 left vertebral artery. The basilar artery  is patent. The posterior cerebral arteries are patent. Posterior communicating arteries are hypoplastic or absent bilaterally. Venous sinuses: Within the limitations of contrast timing, no convincing thrombus. Anatomic variants: As described Review of the MIP images confirms the above findings These results were called by telephone at the time of interpretation on 12/12/2020 at 8:04 pm to provider Dr. Rory Percy, who verbally acknowledged these results. IMPRESSION: CTA neck: 1. The bilateral common carotid and internal carotid arteries are patent within the neck without hemodynamically significant stenosis (50% or greater). Calcified plaque within both carotid systems within the neck. 2. Vertebral arteries patent within the neck bilaterally without stenosis. 3.  Aortic Atherosclerosis (ICD10-I70.0). CTA head: No intracranial large vessel occlusion or proximal high-grade arterial stenosis. Electronically Signed: By: Kellie Simmering DO On: 12/12/2020 20:25   DG Wrist 2 Views Right  Result Date: 12/14/2020 CLINICAL DATA:  Recent fall with wrist pain and swelling, initial encounter EXAM: RIGHT WRIST - 2 VIEW COMPARISON:  None. FINDINGS: No acute fracture or dislocation is noted. Degenerative changes of the radiocarpal joint are seen. Diffuse vascular calcifications are noted as well. No other focal abnormality is seen. IMPRESSION: Degenerative change without acute abnormality. Electronically Signed   By: Inez Catalina M.D.   On: 12/14/2020 20:40   CT HEAD WO CONTRAST  Result Date: 12/13/2020 CLINICAL DATA:  Stroke suspected. EXAM: CT HEAD WITHOUT CONTRAST TECHNIQUE: Contiguous axial images were obtained from the base of the skull through the vertex without intravenous contrast. COMPARISON:  Head CT December 12, 2020 FINDINGS: Brain: No evidence of acute large vascular territory infarction, hemorrhage, hydrocephalus, extra-axial collection or mass lesion/mass effect. Moderate global parenchymal volume loss with commensurate  ex vacuo dilatation of the ventricular system. Similar mild burden of white matter chronic small vessel ischemic disease. Redemonstrated chronic lacunar infarct within the right lentiform nucleus. Vascular: No hyperdense vessel. Atherosclerotic calcifications of the skull base. Skull: Normal. Negative for fracture or focal lesion. Sinuses/Orbits: Mild mucosal thickening in the right sphenoid sinus and scattered ethmoid air cells. Other: Streak artifact from dental hardware. IMPRESSION: 1. No evidence of acute intracranial abnormality. 2. Moderate global parenchymal volume loss and mild chronic small vessel ischemic disease. Redemonstrated chronic lacunar infarct within the right lentiform nucleus. Electronically Signed   By: Dahlia Bailiff MD   On: 12/13/2020 23:37   CT Head Wo Contrast  Result Date: 12/10/2020 CLINICAL DATA:  Fall from toilet.  Head trauma. EXAM: CT HEAD WITHOUT CONTRAST TECHNIQUE: Contiguous axial images were obtained from the base of the skull through the vertex without intravenous contrast. COMPARISON:  None. FINDINGS: Brain: No evidence of acute infarction, hemorrhage, hydrocephalus, extra-axial collection or mass lesion/mass effect. Mild low-density changes within the periventricular and subcortical white matter compatible with chronic microvascular ischemic change. Mild diffuse cerebral volume loss. Vascular: Atherosclerotic calcifications involving the large vessels of the skull base. No unexpected hyperdense vessel. Skull: Normal. Negative for fracture or focal lesion. Sinuses/Orbits: No acute finding. Other: Negative for scalp hematoma. IMPRESSION: 1. No acute intracranial findings. 2. Mild chronic microvascular ischemic change and cerebral volume loss. Electronically Signed   By: Davina Poke D.O.   On: 12/10/2020 14:49   CT Angio Neck W and/or Wo Contrast  Addendum Date: 12/13/2020   ADDENDUM REPORT: 12/13/2020 07:41 ADDENDUM: Finding omitted from the impression of the  report: 2 mm vascular protrusion arising from  the paraclinoid right ICA, which may reflect an aneurysm or infundibulum. These results will be called to the ordering clinician or representative by the Radiologist Assistant, and communication documented in the PACS or [employer]. Electronically Signed   By: Kellie Simmering DO   On: 12/13/2020 07:41   Result Date: 12/13/2020 CLINICAL DATA:  Neuro deficit, acute, stroke suspected. EXAM: CT ANGIOGRAPHY HEAD AND NECK TECHNIQUE: Multidetector CT imaging of the head and neck was performed using the standard protocol during bolus administration of intravenous contrast. Multiplanar CT image reconstructions and MIPs were obtained to evaluate the vascular anatomy. Carotid stenosis measurements (when applicable) are obtained utilizing NASCET criteria, using the distal internal carotid diameter as the denominator. CONTRAST:  44mL OMNIPAQUE IOHEXOL 350 MG/ML SOLN COMPARISON:  No pertinent prior exams available for comparison. FINDINGS: CTA NECK FINDINGS Aortic arch: Standard aortic branching. Atherosclerotic plaque within the visualized aortic arch and proximal major branch vessels of the neck. No hemodynamically significant innominate or proximal subclavian artery stenosis. Right carotid system: CCA and ICA patent within the neck. Calcified plaque within the CCA, carotid bifurcation and proximal ICA without hemodynamically significant stenosis (50% or greater). Left carotid system: CCA and ICA patent within the neck. Calcified plaque within the CCA, carotid bifurcation and proximal ICA without hemodynamically significant stenosis (50% or greater). Vertebral arteries: Codominant and patent within the neck without stenosis. Skeleton: No acute bony abnormality or aggressive osseous lesion. Other neck: No neck mass or cervical lymphadenopathy. Thyroid unremarkable. Upper chest: Areas of scarring within the imaged lung apices. No consolidation within the imaged lung apices.  Review of the MIP images confirms the above findings CTA HEAD FINDINGS Anterior circulation: The intracranial internal carotid arteries are patent. Calcified plaque within both vessels with no more than mild stenosis. The M1 middle cerebral arteries are patent. No M2 proximal branch occlusion or high-grade proximal stenosis is identified. The anterior cerebral arteries are patent. 2 mm vascular protrusion arising from the paraclinoid right ICA which may reflect an aneurysm or infundibulum (series 9, image 94). Posterior circulation: The intracranial vertebral arteries are patent. Minimal nonstenotic calcified plaque within the V4 left vertebral artery. The basilar artery is patent. The posterior cerebral arteries are patent. Posterior communicating arteries are hypoplastic or absent bilaterally. Venous sinuses: Within the limitations of contrast timing, no convincing thrombus. Anatomic variants: As described Review of the MIP images confirms the above findings These results were called by telephone at the time of interpretation on 12/12/2020 at 8:04 pm to provider Dr. Rory Percy, who verbally acknowledged these results. IMPRESSION: CTA neck: 1. The bilateral common carotid and internal carotid arteries are patent within the neck without hemodynamically significant stenosis (50% or greater). Calcified plaque within both carotid systems within the neck. 2. Vertebral arteries patent within the neck bilaterally without stenosis. 3.  Aortic Atherosclerosis (ICD10-I70.0). CTA head: No intracranial large vessel occlusion or proximal high-grade arterial stenosis. Electronically Signed: By: Kellie Simmering DO On: 12/12/2020 20:25   CT Chest W Contrast  Result Date: 12/10/2020 CLINICAL DATA:  Head trauma.  Fell off toilet. EXAM: CT CHEST, ABDOMEN, AND PELVIS WITH CONTRAST TECHNIQUE: Multidetector CT imaging of the chest, abdomen and pelvis was performed following the standard protocol during bolus administration of intravenous  contrast. CONTRAST:  162mL OMNIPAQUE IOHEXOL 300 MG/ML  SOLN COMPARISON:  None FINDINGS: CT CHEST FINDINGS Cardiovascular: Heart size appears normal. There is a left chest wall pacer device with leads in the right atrial appendage and right ventricle. Aortic atherosclerosis. Coronary artery calcifications. Mediastinum/Nodes:  Normal appearance of the thyroid gland. The trachea appears patent and is midline. Normal appearance of the esophagus. No enlarged axillary, supraclavicular, mediastinal or hilar lymph nodes. There is a cluster of prominent left paratracheal lymph nodes (none of which meet CT criteria for adenopathy) which measure up to 9 mm, image 26/3. Lungs/Pleura: Mild left posterior and lateral pleural thickening. Trace right pleural effusion. Low-attenuation, necrotic appearing lung mass within the central left lower lobe is identified. This measures approximately 5.9 x 5.6 by 6.7 cm. Postobstructive pneumonitis is identified within the posterior and lateral left lung base. Enlarged left hilar lymph node is best seen on the coronal image and measures 1.4 cm. Subsegmental atelectasis is noted overlying the posterior right base. Calcified granuloma is noted within the posterior right upper lobe. Bilateral upper lobe bronchial wall thickening and mild bronchiectasis noted. Pleuroparenchymal scarring is seen within the left apex. Musculoskeletal: Acute left lateral eighth rib fracture identified. Multiple subacute to chronic right lateral rib fracture deformities are also identified, image 110/4 and image 124/4. CT ABDOMEN PELVIS FINDINGS Hepatobiliary: No hepatic injury or perihepatic hematoma. Two calcified stones noted within the gallbladder measuring up to 1.4 cm. No signs of gallbladder wall thickening or pericholecystic fluid. Mild increase caliber of the CBD measures up to 1 cm. No signs of choledocholithiasis or obstructing mass. Pancreas: Unremarkable. No pancreatic ductal dilatation or surrounding  inflammatory changes. Spleen: No splenic injury or perisplenic hematoma. Adrenals/Urinary Tract: No adrenal hemorrhage or renal injury identified. Bladder is unremarkable. Stomach/Bowel: Stomach is within normal limits. Appendix appears normal. No evidence of bowel wall thickening, distention, or inflammatory changes. Multiple colonic diverticula identified without acute inflammation. Vascular/Lymphatic: Aortic atherosclerosis. No aneurysm. No abdominopelvic adenopathy identified. Reproductive: Prostate gland appears enlarged. Other: Trace low-attenuation fluid noted within the left posterior pelvis. Large fat containing right inguinal hernia. Musculoskeletal: Previous right hip arthroplasty. The scoliosis and mild degenerative disc disease identified within the lumbar spine. IMPRESSION: 1. Large, necrotic appearing lung mass within the central left lower lobe is identified concerning for primary bronchogenic carcinoma. Signs of postobstructive pneumonitis is noted within the posterior and lateral left lung base. Differential considerations include necrotizing might 2. Enlarged left hilar lymph node and prominent cluster of left paratracheal lymph nodes worrisome for metastatic adenopathy versus reactive adenopathy. 3. Acute left lateral eighth rib fracture. 4. Multiple subacute to chronic right lateral rib fracture deformities are also identified. 5. Gallstones. 6. Large fat containing right inguinal hernia. 7. Trace low-attenuation fluid noted within the left posterior pelvis. 8. Aortic atherosclerosis. Aortic Atherosclerosis (ICD10-I70.0). These results were called by telephone at the time of interpretation on 12/10/2020 at 3:06 pm to provider [facility] , who verbally acknowledged these results. Electronically Signed   By: Kerby Moors M.D.   On: 12/10/2020 15:06   CT ABDOMEN PELVIS W CONTRAST  Result Date: 12/10/2020 CLINICAL DATA:  Head trauma.  Fell off toilet. EXAM: CT CHEST, ABDOMEN, AND PELVIS  WITH CONTRAST TECHNIQUE: Multidetector CT imaging of the chest, abdomen and pelvis was performed following the standard protocol during bolus administration of intravenous contrast. CONTRAST:  123mL OMNIPAQUE IOHEXOL 300 MG/ML  SOLN COMPARISON:  None FINDINGS: CT CHEST FINDINGS Cardiovascular: Heart size appears normal. There is a left chest wall pacer device with leads in the right atrial appendage and right ventricle. Aortic atherosclerosis. Coronary artery calcifications. Mediastinum/Nodes: Normal appearance of the thyroid gland. The trachea appears patent and is midline. Normal appearance of the esophagus. No enlarged axillary, supraclavicular, mediastinal or hilar lymph nodes. There is  a cluster of prominent left paratracheal lymph nodes (none of which meet CT criteria for adenopathy) which measure up to 9 mm, image 26/3. Lungs/Pleura: Mild left posterior and lateral pleural thickening. Trace right pleural effusion. Low-attenuation, necrotic appearing lung mass within the central left lower lobe is identified. This measures approximately 5.9 x 5.6 by 6.7 cm. Postobstructive pneumonitis is identified within the posterior and lateral left lung base. Enlarged left hilar lymph node is best seen on the coronal image and measures 1.4 cm. Subsegmental atelectasis is noted overlying the posterior right base. Calcified granuloma is noted within the posterior right upper lobe. Bilateral upper lobe bronchial wall thickening and mild bronchiectasis noted. Pleuroparenchymal scarring is seen within the left apex. Musculoskeletal: Acute left lateral eighth rib fracture identified. Multiple subacute to chronic right lateral rib fracture deformities are also identified, image 110/4 and image 124/4. CT ABDOMEN PELVIS FINDINGS Hepatobiliary: No hepatic injury or perihepatic hematoma. Two calcified stones noted within the gallbladder measuring up to 1.4 cm. No signs of gallbladder wall thickening or pericholecystic fluid. Mild  increase caliber of the CBD measures up to 1 cm. No signs of choledocholithiasis or obstructing mass. Pancreas: Unremarkable. No pancreatic ductal dilatation or surrounding inflammatory changes. Spleen: No splenic injury or perisplenic hematoma. Adrenals/Urinary Tract: No adrenal hemorrhage or renal injury identified. Bladder is unremarkable. Stomach/Bowel: Stomach is within normal limits. Appendix appears normal. No evidence of bowel wall thickening, distention, or inflammatory changes. Multiple colonic diverticula identified without acute inflammation. Vascular/Lymphatic: Aortic atherosclerosis. No aneurysm. No abdominopelvic adenopathy identified. Reproductive: Prostate gland appears enlarged. Other: Trace low-attenuation fluid noted within the left posterior pelvis. Large fat containing right inguinal hernia. Musculoskeletal: Previous right hip arthroplasty. The scoliosis and mild degenerative disc disease identified within the lumbar spine. IMPRESSION: 1. Large, necrotic appearing lung mass within the central left lower lobe is identified concerning for primary bronchogenic carcinoma. Signs of postobstructive pneumonitis is noted within the posterior and lateral left lung base. Differential considerations include necrotizing might 2. Enlarged left hilar lymph node and prominent cluster of left paratracheal lymph nodes worrisome for metastatic adenopathy versus reactive adenopathy. 3. Acute left lateral eighth rib fracture. 4. Multiple subacute to chronic right lateral rib fracture deformities are also identified. 5. Gallstones. 6. Large fat containing right inguinal hernia. 7. Trace low-attenuation fluid noted within the left posterior pelvis. 8. Aortic atherosclerosis. Aortic Atherosclerosis (ICD10-I70.0). These results were called by telephone at the time of interpretation on 12/10/2020 at 3:06 pm to provider [facility] , who verbally acknowledged these results. Electronically Signed   By: Kerby Moors  M.D.   On: 12/10/2020 15:06   DG Hand 2 View Right  Result Date: 12/14/2020 CLINICAL DATA:  Recent fall with hand pain, initial encounter EXAM: RIGHT HAND - 2 VIEW COMPARISON:  None. FINDINGS: No acute fracture or dislocation is noted. No soft tissue abnormality is seen. Degenerative changes of the radiocarpal joint are seen. IMPRESSION: No acute abnormality noted.  Mild degenerative changes seen. Electronically Signed   By: Inez Catalina M.D.   On: 12/14/2020 20:44   DG CHEST PORT 1 VIEW  Result Date: 12/16/2020 CLINICAL DATA:  Cough.  Recent transbronchial left lower lobe biopsy EXAM: PORTABLE CHEST 1 VIEW COMPARISON:  December 12, 2020 FINDINGS: There is no pneumothorax. There is airspace opacity in the left base with equivocal left pleural effusion. Lungs elsewhere are clear. There is cardiomegaly with pulmonary vascularity within normal limits. Pacemaker leads are attached to the right atrium and right ventricle. There is no  adenopathy. There is aortic atherosclerosis. There is degenerative change in each shoulder. IMPRESSION: No pneumothorax. Airspace opacity in the left base may represent pneumonia or potentially hemorrhage from recent biopsy. Equivocal left pleural effusion. Right lung clear. Stable cardiac prominence with pacemaker leads attached to right atrium and right ventricle. Aortic Atherosclerosis (ICD10-I70.0). Electronically Signed   By: Lowella Grip III M.D.   On: 12/16/2020 14:08   DG Chest Port 1 View  Result Date: 12/12/2020 CLINICAL DATA:  Cough, weakness EXAM: PORTABLE CHEST 1 VIEW COMPARISON:  12/10/2020 FINDINGS: Left pacer remains in place, unchanged. Cardiomegaly, vascular congestion. Left base opacity again noted, improved since prior study. No confluent opacity on the right. No visible effusions. IMPRESSION: Cardiomegaly, vascular congestion. Left base atelectasis or infiltrate, improving since prior study. Electronically Signed   By: Rolm Baptise M.D.   On: 12/12/2020  23:08   DG Chest Portable 1 View  Result Date: 12/10/2020 CLINICAL DATA:  [place] today. EXAM: PORTABLE CHEST 1 VIEW COMPARISON:  Chest x-ray 09/16/2020 FINDINGS: The heart is borderline enlarged. Stable tortuosity and calcification of the thoracic aorta. The pacer wires are in good position, unchanged. Left basilar opacity could be a combination of atelectasis, infiltrate and effusion. The right lung is grossly clear. No pneumothorax identified. The bony thorax is intact.  No obvious acute fractures. IMPRESSION: Left basilar opacity likely a combination of atelectasis and effusion. Grossly intact bony thorax. Electronically Signed   By: Marijo Sanes M.D.   On: 12/10/2020 11:56   DG Shoulder Left Port  Result Date: 12/13/2020 CLINICAL DATA:  Weakness, shoulder pain EXAM: LEFT SHOULDER COMPARISON:  None FINDINGS: Osseous demineralization. Degenerative changes LEFT AC joint with joint space narrowing and spur formation. Additional degenerative changes at LEFT glenohumeral joint. Approximation of LEFT humeral head with undersurface of acromion, raising question of rotator cuff tear. No acute fracture, dislocation, or bone destruction. Atherosclerotic calcification aorta and pacemaker leads noted. Question LEFT pleural effusion. IMPRESSION: Degenerative changes of LEFT acromioclavicular and glenohumeral joints. Approximation of humeral head with undersurface of acromion raising question of rotator cuff tear. Electronically Signed   By: Lavonia Dana M.D.   On: 12/13/2020 18:24   ECHOCARDIOGRAM COMPLETE BUBBLE STUDY  Result Date: 12/13/2020    ECHOCARDIOGRAM REPORT   Patient Name:   RAQUON MILLEDGE Date of Exam: 12/13/2020 Medical Rec #:  [id number]          Height:       71.0 in Accession #:    [id number]         Weight:       200.0 lb Date of Birth:  1931-09-07          BSA:          2.108 m Patient Age:    87 years           BP:           131/70 mmHg Patient Gender: M                  HR:           94 bpm.  Exam Location:  Inpatient Procedure: 2D Echo, Cardiac Doppler, Color Doppler and Intracardiac            Opacification Agent Indications:    TIA (transient ischemic attack) 435.9 / G45.9  History:        Patient has prior history of Echocardiogram examinations, most  recent 05/29/2020. Arrythmias:Atrial Fibrillation; Risk                 Factors:Former Smoker.  Sonographer:    Vickie Epley RDCS Referring Phys: [id number] [place]  1. Left ventricular ejection fraction, by estimation, is 60 to 65%. The left ventricle has normal function. The left ventricle has no regional wall motion abnormalities. Indeterminate diastolic filling due to E-A fusion.  2. Right ventricular systolic function is normal. The right ventricular size is normal.  3. The mitral valve is normal in structure. No evidence of mitral valve regurgitation. No evidence of mitral stenosis.  4. The aortic valve is normal in structure. There is mild calcification of the aortic valve. Aortic valve regurgitation is not visualized. Mild to moderate aortic valve sclerosis/calcification is present, without any evidence of aortic stenosis.  5. Aortic dilatation noted. There is borderline dilatation of the aortic root, measuring 38 mm.  6. The inferior vena cava is normal in size with greater than 50% respiratory variability, suggesting right atrial pressure of 3 mmHg.  7. Agitated saline contrast bubble study was negative, with no evidence of any interatrial shunt. Comparison(s): No significant change from prior study. Prior images reviewed side by side. FINDINGS  Left Ventricle: Left ventricular ejection fraction, by estimation, is 60 to 65%. The left ventricle has normal function. The left ventricle has no regional wall motion abnormalities. Definity contrast agent was given IV to delineate the left ventricular  endocardial borders. The left ventricular internal cavity size was normal in size. There is no left ventricular  hypertrophy. Indeterminate diastolic filling due to E-A fusion. Right Ventricle: The right ventricular size is normal. No increase in right ventricular wall thickness. Right ventricular systolic function is normal. Left Atrium: Left atrial size was normal in size. Right Atrium: Right atrial size was normal in size. Pericardium: There is no evidence of pericardial effusion. Mitral Valve: The mitral valve is normal in structure. There is mild thickening of the mitral valve leaflet(s). Mild mitral annular calcification. No evidence of mitral valve regurgitation. No evidence of mitral valve stenosis. Tricuspid Valve: The tricuspid valve is normal in structure. Tricuspid valve regurgitation is not demonstrated. No evidence of tricuspid stenosis. Aortic Valve: The aortic valve is normal in structure. There is mild calcification of the aortic valve. Aortic valve regurgitation is not visualized. Mild to moderate aortic valve sclerosis/calcification is present, without any evidence of aortic stenosis. Pulmonic Valve: The pulmonic valve was normal in structure. Pulmonic valve regurgitation is not visualized. No evidence of pulmonic stenosis. Aorta: Aortic dilatation noted. There is borderline dilatation of the aortic root, measuring 38 mm. Venous: The inferior vena cava is normal in size with greater than 50% respiratory variability, suggesting right atrial pressure of 3 mmHg. IAS/Shunts: No atrial level shunt detected by color flow Doppler. Agitated saline contrast was given intravenously to evaluate for intracardiac shunting. Agitated saline contrast bubble study was negative, with no evidence of any interatrial shunt.  LEFT VENTRICLE PLAX 2D LVIDd:         5.00 cm      Diastology LVIDs:         3.60 cm      LV e' medial:    5.92 cm/s LV PW:         0.90 cm      LV E/e' medial:  13.6 LV IVS:        0.90 cm      LV e' lateral:   6.97 cm/s LVOT  diam:     2.40 cm      LV E/e' lateral: 11.5 LV SV:         80 LV SV Index:   38  LVOT Area:     4.52 cm  LV Volumes (MOD) LV vol d, MOD A2C: 121.0 ml LV vol d, MOD A4C: 148.0 ml LV vol s, MOD A2C: 59.5 ml LV vol s, MOD A4C: 72.2 ml LV SV MOD A2C:     61.5 ml LV SV MOD A4C:     148.0 ml LV SV MOD BP:      67.5 ml RIGHT VENTRICLE RV S prime:     12.70 cm/s TAPSE (M-mode): 2.2 cm LEFT ATRIUM           Index       RIGHT ATRIUM           Index LA diam:      3.60 cm 1.71 cm/m  RA Area:     13.90 cm LA Vol (A2C): 31.6 ml 14.99 ml/m RA Volume:   27.20 ml  12.90 ml/m LA Vol (A4C): 40.8 ml 19.35 ml/m  AORTIC VALVE LVOT Vmax:   77.60 cm/s LVOT Vmean:  57.600 cm/s LVOT VTI:    0.176 m  AORTA Ao Root diam: 3.80 cm Ao Asc diam:  3.80 cm MITRAL VALVE MV Area (PHT): 3.17 cm    SHUNTS MV Decel Time: 239 msec    Systemic VTI:  0.18 m MV E velocity: 80.50 cm/s  Systemic Diam: 2.40 cm MV A velocity: 59.20 cm/s MV E/A ratio:  1.36 Mihai Croitoru MD Electronically signed by Sanda Klein MD Signature Date/Time: 12/13/2020/12:44:09 PM    Final    CT HEAD CODE STROKE WO CONTRAST`  Result Date: 12/12/2020 CLINICAL DATA:  Code stroke. Additional history provided: Neuro deficit, acute, stroke suspected. Additional history provided: Last known well on known, left-sided weakness/neglect. EXAM: CT HEAD WITHOUT CONTRAST TECHNIQUE: Contiguous axial images were obtained from the base of the skull through the vertex without intravenous contrast. COMPARISON:  Prior head CT 12/10/2020. FINDINGS: Brain: Streak and beam hardening artifact arising from dental restoration partially obscures the occipital lobes and significantly obscures the posterior fossa. Moderate cerebral atrophy. Comparatively mild cerebellar atrophy. Commensurate prominence of the ventricles and sulci. Mild patchy and ill-defined hypoattenuation within the cerebral white matter is nonspecific, but compatible with chronic small vessel ischemic disease. Redemonstrated chronic lacunar infarct within the right lentiform nucleus. Within the limitations of  motion degradation, no acute intracranial hemorrhage or acute demarcated cortical infarct is identified. No extra-axial fluid collection. No evidence of intracranial mass. No midline shift. Vascular: No appreciable hyperdense vessel. Atherosclerotic calcifications. Skull: Normal. Negative for fracture or focal lesion. Sinuses/Orbits: Visualized orbits show no acute finding. Mild mucosal thickening within the right sphenoid sinus. ASPECTS ([place] Stroke Program Early CT Score) - Ganglionic level infarction (caudate, lentiform nuclei, internal capsule, insula, M1-M3 cortex): 7 - Supraganglionic infarction (M4-M6 cortex): 3 Total score (0-10 with 10 being normal): 10 These results were called by telephone at the time of interpretation on 12/12/2020 at 8:04 pm to provider [facility] , who verbally acknowledged these results. IMPRESSION: Streak and beam hardening artifact arising from dental restoration partially obscures the occipital lobes and significantly obscures the posterior fossa. Within this limitation, there is no evidence of acute intracranial abnormality. ASPECTS is 10. Redemonstrated chronic right basal ganglia lacunar infarct. Moderate cerebral atrophy with mild cerebral white matter chronic small vessel ischemic disease. Comparatively mild cerebellar atrophy. Electronically  Signed   By: Kellie Simmering DO   On: 12/12/2020 20:05   DG C-ARM BRONCHOSCOPY  Result Date: 12/16/2020 C-ARM BRONCHOSCOPY: Fluoroscopy was utilized by the requesting physician.  No radiographic interpretation.     Subjective:  No new complaints  Discharge Exam: Vitals:   12/17/20 0339 12/17/20 0727  BP: 116/60 (!) 118/56  Pulse: 76 75  Resp: 18 20  Temp: 98.2 F (36.8 C) (!) 97.5 F (36.4 C)  SpO2: 95% 98%   Vitals:   12/16/20 2020 12/17/20 0006 12/17/20 0339 12/17/20 0727  BP: (!) 101/52 112/64 116/60 (!) 118/56  Pulse: 82 75 76 75  Resp: 19 18 18 20   Temp: 99.9 F (37.7 C) 98.4 F (36.9 C) 98.2 F (36.8  C) (!) 97.5 F (36.4 C)  TempSrc: Oral Oral Oral Oral  SpO2: 98% 94% 95% 98%  Weight:      Height:        General: Pt is alert, awake, not in acute distress Cardiovascular: RRR, S1/S2 +, no rubs, no gallops Respiratory: CTA bilaterally, no wheezing, no rhonchi Abdominal: Soft, NT, ND, bowel sounds + Extremities: no edema, no cyanosis    The results of significant diagnostics from this hospitalization (including imaging, microbiology, ancillary and laboratory) are listed below for reference.     Microbiology: Recent Results (from the past 240 hour(s))  Resp Panel by RT-PCR (Flu A&B, Covid) Nasopharyngeal Swab     Status: None   Collection Time: 12/12/20  7:13 PM   Specimen: Nasopharyngeal Swab; Nasopharyngeal(NP) swabs in vial transport medium  Result Value Ref Range Status   SARS Coronavirus 2 by RT PCR NEGATIVE NEGATIVE Final    Comment: (NOTE) SARS-CoV-2 target nucleic acids are NOT DETECTED.  The SARS-CoV-2 RNA is generally detectable in upper respiratory specimens during the acute phase of infection. The lowest concentration of SARS-CoV-2 viral copies this assay can detect is 138 copies/mL. A negative result does not preclude SARS-Cov-2 infection and should not be used as the sole basis for treatment or other patient management decisions. A negative result may occur with  improper specimen collection/handling, submission of specimen other than nasopharyngeal swab, presence of viral mutation(s) within the areas targeted by this assay, and inadequate number of viral copies(<138 copies/mL). A negative result must be combined with clinical observations, patient history, and epidemiological information. The expected result is Negative.  Fact Sheet for Patients:  EntrepreneurPulse.com.au  Fact Sheet for Healthcare Providers:  IncredibleEmployment.be  This test is no t yet approved or cleared by the [place] FDA and  has been  authorized for detection and/or diagnosis of SARS-CoV-2 by FDA under an Emergency Use Authorization (EUA). This EUA will remain  in effect (meaning this test can be used) for the duration of the COVID-19 declaration under Section 564(b)(1) of the Act, 21 U.S.C.section 360bbb-3(b)(1), unless the authorization is terminated  or revoked sooner.       Influenza A by PCR NEGATIVE NEGATIVE Final   Influenza B by PCR NEGATIVE NEGATIVE Final    Comment: (NOTE) The Xpert Xpress SARS-CoV-2/FLU/RSV plus assay is intended as an aid in the diagnosis of influenza from Nasopharyngeal swab specimens and should not be used as a sole basis for treatment. Nasal washings and aspirates are unacceptable for Xpert Xpress SARS-CoV-2/FLU/RSV testing.  Fact Sheet for Patients: EntrepreneurPulse.com.au  Fact Sheet for Healthcare Providers: IncredibleEmployment.be  This test is not yet approved or cleared by the [place] FDA and has been authorized for detection and/or diagnosis of SARS-CoV-2 by  FDA under an Emergency Use Authorization (EUA). This EUA will remain in effect (meaning this test can be used) for the duration of the COVID-19 declaration under Section 564(b)(1) of the Act, 21 U.S.C. section 360bbb-3(b)(1), unless the authorization is terminated or revoked.  Performed at [facility], [place] [address], [place], [place] [address]   Culture, blood (routine x 2)     Status: None (Preliminary result)   Collection Time: 12/13/20  4:16 AM   Specimen: BLOOD  Result Value Ref Range Status   Specimen Description BLOOD SITE NOT SPECIFIED  Final   Special Requests   Final    BOTTLES DRAWN AEROBIC AND ANAEROBIC Blood Culture adequate volume   Culture   Final    NO GROWTH 4 DAYS Performed at [facility], [place] [address], [place], [place] [address]    Report Status PENDING  Incomplete  Culture, blood (routine x 2)     Status: None (Preliminary result)    Collection Time: 12/13/20  4:17 AM   Specimen: BLOOD  Result Value Ref Range Status   Specimen Description BLOOD SITE NOT SPECIFIED  Final   Special Requests   Final    BOTTLES DRAWN AEROBIC AND ANAEROBIC Blood Culture results may not be optimal due to an inadequate volume of blood received in culture bottles   Culture   Final    NO GROWTH 4 DAYS Performed at [facility], [place] [address] [place], [place] [address]    Report Status PENDING  Incomplete  SARS CORONAVIRUS 2 (TAT 6-24 HRS) Nasopharyngeal Nasopharyngeal Swab     Status: None   Collection Time: 12/16/20  2:42 PM   Specimen: Nasopharyngeal Swab  Result Value Ref Range Status   SARS Coronavirus 2 NEGATIVE NEGATIVE Final    Comment: (NOTE) SARS-CoV-2 target nucleic acids are NOT DETECTED.  The SARS-CoV-2 RNA is generally detectable in upper and lower respiratory specimens during the acute phase of infection. Negative results do not preclude SARS-CoV-2 infection, do not rule out co-infections with other pathogens, and should not be used as the sole basis for treatment or other patient management decisions. Negative results must be combined with clinical observations, patient history, and epidemiological information. The expected result is Negative.  Fact Sheet for Patients: SugarRoll.be  Fact Sheet for Healthcare Providers: https://www.woods-mathews.com/  This test is not yet approved or cleared by the [place] FDA and  has been authorized for detection and/or diagnosis of SARS-CoV-2 by FDA under an Emergency Use Authorization (EUA). This EUA will remain  in effect (meaning this test can be used) for the duration of the COVID-19 declaration under Se ction 564(b)(1) of the Act, 21 U.S.C. section 360bbb-3(b)(1), unless the authorization is terminated or revoked sooner.  Performed at [facility], [place] [address] [place],  [address]      Labs: BNP  (last 3 results) No results for input(s): BNP in the last 8760 hours. Basic Metabolic Panel: Recent Labs  Lab 12/10/20 1131 12/12/20 1800 12/12/20 2017 12/13/20 0130  NA 136 131* 133* 130*  K 3.8 4.0 4.0 4.1  CL 103 96* 95* 97*  CO2 27 26  --  25  GLUCOSE 118* 132* 125* 110*  BUN 11 22 27* 23  CREATININE 0.90 1.39* 1.20 1.12  CALCIUM 8.2* 8.5*  --  7.9*  MG  --   --   --  2.3   Liver Function Tests: Recent Labs  Lab 12/12/20 1800 12/13/20 0130  AST  27 26  ALT 57* 47*  ALKPHOS 109 85  BILITOT 1.1 1.5*  PROT 8.5* 7.1  ALBUMIN 2.9* 2.4*   No results for input(s): LIPASE, AMYLASE in the last 168 hours. No results for input(s): AMMONIA in the last 168 hours. CBC: Recent Labs  Lab 12/12/20 1800 12/12/20 2017 12/13/20 0136 12/14/20 0928 12/15/20 0156 12/15/20 2117 12/16/20 0507  WBC 22.7*  --  16.4* 17.4* 15.7* 14.1* 12.1*  NEUTROABS 18.6*  --  13.0* 14.6*  --   --   --   HGB 8.5*   < > 7.2* 7.0* 6.6* 7.8* 7.8*  HCT 27.2*   < > 22.7* 20.7* 20.2* 24.2* 23.8*  MCV 91.6  --  90.4 87.0 89.4 88.3 88.5  PLT 277  --  188 237 185 167 161   < > = values in this interval not displayed.   Cardiac Enzymes: No results for input(s): CKTOTAL, CKMB, CKMBINDEX, TROPONINI in the last 168 hours. BNP: Invalid input(s): POCBNP CBG: No results for input(s): GLUCAP in the last 168 hours. D-Dimer No results for input(s): DDIMER in the last 72 hours. Hgb A1c No results for input(s): HGBA1C in the last 72 hours. Lipid Profile No results for input(s): CHOL, HDL, LDLCALC, TRIG, CHOLHDL, LDLDIRECT in the last 72 hours. Thyroid function studies No results for input(s): TSH, T4TOTAL, T3FREE, THYROIDAB in the last 72 hours.  Invalid input(s): FREET3 Anemia work up No results for input(s): VITAMINB12, FOLATE, FERRITIN, TIBC, IRON, RETICCTPCT in the last 72 hours. Urinalysis    Component Value Date/Time   COLORURINE YELLOW 12/12/2020 2222   APPEARANCEUR CLEAR 12/12/2020 2222    LABSPEC 1.029 12/12/2020 2222   PHURINE 5.0 12/12/2020 2222   GLUCOSEU NEGATIVE 12/12/2020 2222   HGBUR SMALL (A) 12/12/2020 2222   BILIRUBINUR NEGATIVE 12/12/2020 2222   KETONESUR NEGATIVE 12/12/2020 2222   PROTEINUR NEGATIVE 12/12/2020 2222   NITRITE NEGATIVE 12/12/2020 2222   LEUKOCYTESUR NEGATIVE 12/12/2020 2222   Sepsis Labs Invalid input(s): PROCALCITONIN,  WBC,  LACTICIDVEN Microbiology Recent Results (from the past 240 hour(s))  Resp Panel by RT-PCR (Flu A&B, Covid) Nasopharyngeal Swab     Status: None   Collection Time: 12/12/20  7:13 PM   Specimen: Nasopharyngeal Swab; Nasopharyngeal(NP) swabs in vial transport medium  Result Value Ref Range Status   SARS Coronavirus 2 by RT PCR NEGATIVE NEGATIVE Final    Comment: (NOTE) SARS-CoV-2 target nucleic acids are NOT DETECTED.  The SARS-CoV-2 RNA is generally detectable in upper respiratory specimens during the acute phase of infection. The lowest concentration of SARS-CoV-2 viral copies this assay can detect is 138 copies/mL. A negative result does not preclude SARS-Cov-2 infection and should not be used as the sole basis for treatment or other patient management decisions. A negative result may occur with  improper specimen collection/handling, submission of specimen other than nasopharyngeal swab, presence of viral mutation(s) within the areas targeted by this assay, and inadequate number of viral copies(<138 copies/mL). A negative result must be combined with clinical observations, patient history, and epidemiological information. The expected result is Negative.  Fact Sheet for Patients:  EntrepreneurPulse.com.au  Fact Sheet for Healthcare Providers:  IncredibleEmployment.be  This test is no t yet approved or cleared by the [place] FDA and  has been authorized for detection and/or diagnosis of SARS-CoV-2 by FDA under an Emergency Use Authorization (EUA). This EUA will remain   in effect (meaning this test can be used) for the duration of the COVID-19 declaration under Section 564(b)(1) of  the Act, 21 U.S.C.section 360bbb-3(b)(1), unless the authorization is terminated  or revoked sooner.       Influenza A by PCR NEGATIVE NEGATIVE Final   Influenza B by PCR NEGATIVE NEGATIVE Final    Comment: (NOTE) The Xpert Xpress SARS-CoV-2/FLU/RSV plus assay is intended as an aid in the diagnosis of influenza from Nasopharyngeal swab specimens and should not be used as a sole basis for treatment. Nasal washings and aspirates are unacceptable for Xpert Xpress SARS-CoV-2/FLU/RSV testing.  Fact Sheet for Patients: EntrepreneurPulse.com.au  Fact Sheet for Healthcare Providers: IncredibleEmployment.be  This test is not yet approved or cleared by the [place] FDA and has been authorized for detection and/or diagnosis of SARS-CoV-2 by FDA under an Emergency Use Authorization (EUA). This EUA will remain in effect (meaning this test can be used) for the duration of the COVID-19 declaration under Section 564(b)(1) of the Act, 21 U.S.C. section 360bbb-3(b)(1), unless the authorization is terminated or revoked.  Performed at [facility], [place] [address] [place], [place] [address]   Culture, blood (routine x 2)     Status: None (Preliminary result)   Collection Time: 12/13/20  4:16 AM   Specimen: BLOOD  Result Value Ref Range Status   Specimen Description BLOOD SITE NOT SPECIFIED  Final   Special Requests   Final    BOTTLES DRAWN AEROBIC AND ANAEROBIC Blood Culture adequate volume   Culture   Final    NO GROWTH 4 DAYS Performed at [facility], [place] [address] [place], [place] [address]    Report Status PENDING  Incomplete  Culture, blood (routine x 2)     Status: None (Preliminary result)   Collection Time: 12/13/20  4:17 AM   Specimen: BLOOD  Result Value Ref Range Status   Specimen Description BLOOD SITE NOT  SPECIFIED  Final   Special Requests   Final    BOTTLES DRAWN AEROBIC AND ANAEROBIC Blood Culture results may not be optimal due to an inadequate volume of blood received in culture bottles   Culture   Final    NO GROWTH 4 DAYS Performed at [facility], [place] [address], [place], [place] [address]    Report Status PENDING  Incomplete  SARS CORONAVIRUS 2 (TAT 6-24 HRS) Nasopharyngeal Nasopharyngeal Swab     Status: None   Collection Time: 12/16/20  2:42 PM   Specimen: Nasopharyngeal Swab  Result Value Ref Range Status   SARS Coronavirus 2 NEGATIVE NEGATIVE Final    Comment: (NOTE) SARS-CoV-2 target nucleic acids are NOT DETECTED.  The SARS-CoV-2 RNA is generally detectable in upper and lower respiratory specimens during the acute phase of infection. Negative results do not preclude SARS-CoV-2 infection, do not rule out co-infections with other pathogens, and should not be used as the sole basis for treatment or other patient management decisions. Negative results must be combined with clinical observations, patient history, and epidemiological information. The expected result is Negative.  Fact Sheet for Patients: SugarRoll.be  Fact Sheet for Healthcare Providers: https://www.woods-mathews.com/  This test is not yet approved or cleared by the [place] FDA and  has been authorized for detection and/or diagnosis of SARS-CoV-2 by FDA under an Emergency Use Authorization (EUA). This EUA will remain  in effect (meaning this test can be used) for the duration of the COVID-19 declaration under Se ction 564(b)(1) of the Act, 21 U.S.C. section 360bbb-3(b)(1), unless the authorization is terminated or revoked sooner.  Performed at [facility], [address] [place], [place] [address]      Time coordinating discharge: Over 40 minutes  SIGNED:   Charlynne Cousins, MD  [facility] 12/17/2020, 10:32 AM Pager    If 7PM-7AM, please contact night-coverage [email or web address] Password [email or web address]

## 2020-12-17 NOTE — Progress Notes (Signed)
Modified Barium Swallow Progress Note  Patient Details  Name: Ricky [name] MRN: [id number] Date of Birth: [birth year]  Today's Date: 12/17/2020  Modified Barium Swallow completed.  Full report located under Chart Review in the Imaging Section.  Brief recommendations include the following:  Clinical Impression  Pt presents with a moderate oropharyngeal dysphagia. His oral phase is primarily characterized by delayed oral transit and lingual deficits such as reduced posterior propulsion and lingual pumping across all consistencies. During his pharyngeal phase, he demonstrated reduced laryngeal closure that allowed boluses to enter his airway during his swallow that resulted in penetration and eventual aspiration (PAS 8) of thin and nectar thick liquids. A chin tuck was introduced with both of these consistencies that did not improve airway protection and he was unable to clear his airway despite cues to cough. He also has a reduced base of tongue retraction as well as reduced UES relaxation with a prominent CP that led to significant pharyngeal residue. SLP prompted the pt to complete additional effortful swallows and introduced liquid washes with thicker consistences to reduce this residue; however, a significant amount still remained in his valleculae. Penetration x1 occurred with residue from purees while pt was repositioning, but the penetrates cleared from his airway with a cued cough. Otherwise, with purees and honey thick liquids, the pt consistently protected his airway across trials. Based on these findings, recommend DYS 2 solids as well as honey thick liquids with cues to complete multiple effortful swallows per bite/sip. Attempted to f/u with son at bedside after the study, but he was not present. Will f/u if pt remains inpatient.   Swallow Evaluation Recommendations       SLP Diet Recommendations: Dysphagia 2 (Fine chop) solids;Honey thick liquids   Liquid Administration via:  Cup   Medication Administration: Crushed with puree   Supervision: Staff to assist with self feeding;Full supervision/cueing for compensatory strategies   Compensations: Minimize environmental distractions;Slow rate;Small sips/bites;Multiple dry swallows after each bite/sip;Effortful swallow   Postural Changes: Remain semi-upright after after feeds/meals (Comment);Seated upright at 90 degrees   Oral Care Recommendations: Oral care BID   Other Recommendations: Order thickener from pharmacy;Prohibited food (jello, ice cream, thin soups);Remove water pitcher    Jeanine Luz., SLP Student 12/17/2020,2:40 PM

## 2020-12-17 NOTE — Progress Notes (Signed)
Report called to Van Clines, LPN at [facility].  Pt awaiting transport by [facility].  Son aware.

## 2020-12-17 NOTE — Progress Notes (Signed)
  Speech Language Pathology Treatment: Dysphagia  Patient Details Name: Ricky [name] MRN: [id number] DOB: Jun 23, 1931 Today's Date: 12/17/2020 Time: 9528-4132 SLP Time Calculation (min) (ACUTE ONLY): 16 min  Assessment / Plan / Recommendation Clinical Impression  Pt seen at beside with no overt s/s of aspiration; however, recent bronchoscopy revealed potential chronic inflammation from aspiration. Given these findings, an MBS has been scheduled for today to assess his ability to protect his airway. Pt's son did not report any subjective s/s of aspiration during breakfast but provided education about risk for silent aspiration. Both the pt and his son agreed to this testing prior to discharging today. In the interim, recommend continuation of his current diet.    HPI HPI: 87 y.o. 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 Report of left arm weakness and a facial droop, Acute metabolic encephalopathy. CT of the head showed no acute findings. Pt initially passed the [place] swallow screen but was made NPO again due to AMS.      SLP Plan  MBS       Recommendations  Diet recommendations: Regular;Thin liquid Liquids provided via: Cup;Straw Medication Administration: Whole meds with puree Supervision: Staff to assist with self feeding;Intermittent supervision to cue for compensatory strategies Compensations: Slow rate;Small sips/bites;Minimize environmental distractions Postural Changes and/or Swallow Maneuvers: Seated upright 90 degrees                Oral Care Recommendations: Oral care BID Follow up Recommendations: Skilled Nursing facility;24  hour supervision/assistance SLP Visit Diagnosis: Dysphagia, unspecified (R13.10) Plan: MBS       GO                Jeanine Luz., SLP Student 12/17/2020, 11:10 AM

## 2020-12-18 LAB — CULTURE, BLOOD (ROUTINE X 2)
Culture: NO GROWTH
Culture: NO GROWTH
Special Requests: ADEQUATE

## 2020-12-19 ENCOUNTER — Other Ambulatory Visit (HOSPITAL_COMMUNITY): Payer: Self-pay

## 2020-12-19 ENCOUNTER — Institutional Professional Consult (permissible substitution): Payer: Medicare Other | Admitting: Pulmonary Disease

## 2020-12-23 ENCOUNTER — Encounter: Admission: RE | Admit: 2020-12-23 | Payer: Medicare Other | Source: Ambulatory Visit

## 2020-12-25 NOTE — Progress Notes (Signed)
Patient Care Team: Roetta Sessions, NP as PCP - General (Nurse Practitioner) Nahser, Wonda Cheng, MD as PCP - Cardiology (Cardiology)  DIAGNOSIS:    ICD-10-CM   1. Myelodysplastic syndrome ([place])  D46.9     CHIEF COMPLIANT: Follow-up of myelodysplastic syndrome, recent hospitalization  INTERVAL HISTORY: Ricky [name] is a 87 y.o. with above-mentioned history of myelodysplastic syndrome who is requiring blood transfusions on a weekly basis and also receiving Aranesp. He was admitted from 12/12/20-12/17/20 after he presented to the ED for recurrent falls on 12/10/20. CT CAP on 12/10/20 showed a large necrotic lung mass in the left lower lobe concerning for primary bronchogenic carcinoma with enlarged left hilar lymph node. Biopsy on 12/16/20 showed inflammation with no evidence of malignancy. He presents to the clinic today for follow-up.   ALLERGIES:  has No Known Allergies.  MEDICATIONS:  Current Outpatient Medications  Medication Sig Dispense Refill  . apixaban (ELIQUIS) 5 MG TABS tablet Take 1 tablet (5 mg total) by mouth 2 (two) times daily. 180 tablet 1  . cholecalciferol (VITAMIN D3) 25 MCG (1000 UNIT) tablet Take 1,000 Units by mouth daily.    . cyanocobalamin 1000 MCG tablet Take 1,000 mcg by mouth daily.    . Magnesium 250 MG TABS Take 1 tablet (250 mg total) by mouth daily.  0  . simvastatin (ZOCOR) 20 MG tablet Take 20 mg by mouth daily.    Marland Kitchen torsemide (DEMADEX) 20 MG tablet Take 1 tablet (20 mg total) by mouth 2 (two) times daily. 180 tablet 3  . traMADol (ULTRAM) 50 MG tablet Take 1 tablet (50 mg total) by mouth every 12 (twelve) hours as needed. 10 tablet 0   No current facility-administered medications for this visit.    PHYSICAL EXAMINATION: ECOG PERFORMANCE STATUS: 1 - Symptomatic but completely ambulatory  Vitals:   12/26/20 1332  BP: 113/74  Pulse: 93  Resp: 18  Temp: (!) 97.5 F (36.4 C)  SpO2: 100%   Filed Weights     LABORATORY DATA:  I have  reviewed the data as listed CMP Latest Ref Rng & Units 12/26/2020 12/13/2020 12/12/2020  Glucose 70 - 99 mg/dL 129(H) 110(H) 125(H)  BUN 8 - 23 mg/dL 36(H) 23 27(H)  Creatinine 0.61 - 1.24 mg/dL 1.16 1.12 1.20  Sodium 135 - 145 mmol/L 135 130(L) 133(L)  Potassium 3.5 - 5.1 mmol/L 4.2 4.1 4.0  Chloride 98 - 111 mmol/L 92(L) 97(L) 95(L)  CO2 22 - 32 mmol/L 31 25 -  Calcium 8.9 - 10.3 mg/dL 8.4(L) 7.9(L) -  Total Protein 6.5 - 8.1 g/dL 9.0(H) 7.1 -  Total Bilirubin 0.3 - 1.2 mg/dL 0.9 1.5(H) -  Alkaline Phos 38 - 126 U/L 200(H) 85 -  AST 15 - 41 U/L 22 26 -  ALT 0 - 44 U/L 57(H) 47(H) -    Lab Results  Component Value Date   WBC 7.9 12/26/2020   HGB 9.9 (L) 12/26/2020   HCT 30.9 (L) 12/26/2020   MCV 90.1 12/26/2020   PLT 124 (L) 12/26/2020   NEUTROABS 5.5 12/26/2020    ASSESSMENT & PLAN:  Myelodysplastic syndrome (HCC) Treatment plan:Aranesp every 3 weeks 500 mcg (increased from 300 mcg) and supportive care with blood transfusions  Lab review:  01/29/2020: Hemoglobin 7.8, MCV 103.4, platelets 149 03/28/2020: Hemoglobin is 9 (it was 6.9 on 03/22/2020) 05/23/2020: Hemoglobin 7.1(received blood transfusion) 07/05/2020: Hemoglobin 6.2 (received blood transfusion) 07/26/2020: Hemoglobin 7.3 (received blood transfusion) 09/16/2020: Hemoglobin 7.1 (received blood)  10/15/2020:Hemoglobin 6.9 (  1 unit) 11/23/20: ED (1 unit PRBC) 11/26/20: I unit PRBC 12/26/2020: Hemoglobin 9.9  Bone marrow biopsy: Hypercellular bone marrow for age 49 to 60% cellularity with dyspoietic changes associated with abundant ring sideroblasts. No increase in blastic cells identified. Findings favor MDS with ringed sideroblasts Cytogenetics and FISH: SF3B1 mutation detected (present in 80% of MDS with ringed sideroblasts) no other mutations  Treatment plan: Aranespstarted 5/21/2021and blood transfusions and supportive care   Elevated ferritin: Iron overload from frequent blood transfusions:onExjade. 1000 mg a  day Hospitalization: 12/12/2020-12/17/2020: CT showing large necrotic left lower lobe lung mass, severe sepsis Bronchoscopy, EBUS 12/16/2020: Severe inflammation, no malignancy Based on this, we can cancel PET CT scan.  He will come every 2 weeks for lab checks and blood transfusions as needed.  In 2 weeks is also due for his Aranesp injection.  No orders of the defined types were placed in this encounter.  The patient has a good understanding of the overall plan. he agrees with it. he will call with any problems that may develop before the next visit here.  Total time spent: 30 mins including face to face time and time spent for planning, charting and coordination of care  Rulon Eisenmenger, MD, MPH 12/26/2020  I, Molly Dorshimer, am acting as scribe for Dr. Nicholas Lose.  I have reviewed the above documentation for accuracy and completeness, and I agree with the above.

## 2020-12-26 ENCOUNTER — Inpatient Hospital Stay: Payer: Medicare Other | Attending: Hematology and Oncology | Admitting: Hematology and Oncology

## 2020-12-26 ENCOUNTER — Other Ambulatory Visit: Payer: Medicare Other | Admitting: Hospice

## 2020-12-26 ENCOUNTER — Telehealth: Payer: Self-pay | Admitting: Hematology and Oncology

## 2020-12-26 ENCOUNTER — Ambulatory Visit: Payer: Medicare Other | Admitting: Hematology and Oncology

## 2020-12-26 ENCOUNTER — Inpatient Hospital Stay: Payer: Medicare Other

## 2020-12-26 ENCOUNTER — Other Ambulatory Visit: Payer: Self-pay | Admitting: *Deleted

## 2020-12-26 ENCOUNTER — Other Ambulatory Visit: Payer: Self-pay

## 2020-12-26 DIAGNOSIS — D638 Anemia in other chronic diseases classified elsewhere: Secondary | ICD-10-CM

## 2020-12-26 DIAGNOSIS — D469 Myelodysplastic syndrome, unspecified: Secondary | ICD-10-CM | POA: Insufficient documentation

## 2020-12-26 LAB — CMP (CANCER CENTER ONLY)
ALT: 57 U/L — ABNORMAL HIGH (ref 0–44)
AST: 22 U/L (ref 15–41)
Albumin: 3.3 g/dL — ABNORMAL LOW (ref 3.5–5.0)
Alkaline Phosphatase: 200 U/L — ABNORMAL HIGH (ref 38–126)
Anion gap: 12 (ref 5–15)
BUN: 36 mg/dL — ABNORMAL HIGH (ref 8–23)
CO2: 31 mmol/L (ref 22–32)
Calcium: 8.4 mg/dL — ABNORMAL LOW (ref 8.9–10.3)
Chloride: 92 mmol/L — ABNORMAL LOW (ref 98–111)
Creatinine: 1.16 mg/dL (ref 0.61–1.24)
GFR, Estimated: >60 mL/min (ref >60)
Glucose, Bld: 129 mg/dL — ABNORMAL HIGH (ref 70–99)
Potassium: 4.2 mmol/L (ref 3.5–5.1)
Sodium: 135 mmol/L (ref 135–145)
Total Bilirubin: 0.9 mg/dL (ref 0.3–1.2)
Total Protein: 9 g/dL — ABNORMAL HIGH (ref 6.5–8.1)

## 2020-12-26 LAB — CBC WITH DIFFERENTIAL (CANCER CENTER ONLY)
Abs Immature Granulocytes: 0.1 10*3/uL — ABNORMAL HIGH (ref 0.00–0.07)
Basophils Absolute: 0 10*3/uL (ref 0.0–0.1)
Basophils Relative: 0 %
Eosinophils Absolute: 0.1 10*3/uL (ref 0.0–0.5)
Eosinophils Relative: 1 %
HCT: 30.9 % — ABNORMAL LOW (ref 39.0–52.0)
Hemoglobin: 9.9 g/dL — ABNORMAL LOW (ref 13.0–17.0)
Immature Granulocytes: 1 %
Lymphocytes Relative: 9 %
Lymphs Abs: 0.7 10*3/uL (ref 0.7–4.0)
MCH: 28.9 pg (ref 26.0–34.0)
MCHC: 32 g/dL (ref 30.0–36.0)
MCV: 90.1 fL (ref 80.0–100.0)
Monocytes Absolute: 1.6 10*3/uL — ABNORMAL HIGH (ref 0.1–1.0)
Monocytes Relative: 20 %
Neutro Abs: 5.5 10*3/uL (ref 1.7–7.7)
Neutrophils Relative %: 69 %
Platelet Count: 124 10*3/uL — ABNORMAL LOW (ref 150–400)
RBC: 3.43 MIL/uL — ABNORMAL LOW (ref 4.22–5.81)
RDW: 16.2 % — ABNORMAL HIGH (ref 11.5–15.5)
WBC Count: 7.9 10*3/uL (ref 4.0–10.5)
nRBC: 0 % (ref 0.0–0.2)

## 2020-12-26 LAB — SAMPLE TO BLOOD BANK

## 2020-12-26 NOTE — Progress Notes (Signed)
    [facility] Consult Note Telephone: [phone]  Fax: [phone]  Patient not seen as scheduled. Son reported patient in a long term care facility for acute rehab.

## 2020-12-26 NOTE — Assessment & Plan Note (Signed)
Gastroenterology evaluation: Did not feel like he is a candidate for endoscopies or biopsies. Blood transfusion 02/21/2020, 03/23/20,05/03/2020  Treatment plan:Aranesp every 3 weeks 500 mcg (increased from 300 mcg)  Lab review:  01/29/2020: Hemoglobin 7.8, MCV 103.4, platelets 149 03/28/2020: Hemoglobin is 9 (it was 6.9 on 03/22/2020) 05/23/2020: Hemoglobin 7.1(received blood transfusion) 07/05/2020: Hemoglobin 6.2 (received blood transfusion) 07/26/2020: Hemoglobin 7.3 (received blood transfusion) 09/16/2020: Hemoglobin 7.1 (received blood)  10/15/2020:Hemoglobin 6.9 (1 unit) 11/23/20: ED (1 unit PRBC) 11/26/20: I unit PRBC  Bone marrow biopsy: Hypercellular bone marrow for age 73 to 60% cellularity with dyspoietic changes associated with abundant ring sideroblasts. No increase in blastic cells identified. Findings favor MDS with ringed sideroblasts Cytogenetics and FISH: SF3B1 mutation detected (present in 80% of MDS with ringed sideroblasts) no other mutations  Treatment plan: Aranespstarted 5/21/2021and blood transfusions and supportive care   Elevated ferritin: Iron overload from frequent blood transfusions:onExjade. 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

## 2020-12-26 NOTE — Telephone Encounter (Signed)
Scheduled appts per 4/7 los. Pt aware.

## 2020-12-27 ENCOUNTER — Ambulatory Visit: Payer: Medicare Other | Admitting: Hematology and Oncology

## 2020-12-30 ENCOUNTER — Encounter: Payer: Self-pay | Admitting: Cardiovascular Disease

## 2020-12-30 NOTE — Progress Notes (Signed)
Cardiology Office Note:    Date:  12/31/2020   ID:  Ricky Ricky [name], DOB 01-22-1931, MRN [id number]  PCP:  Ricky Sessions, NP  [facility] Cardiologist:  Ricky Ricky [name]  [facility] Electrophysiologist:  None   Referring MD: No ref. provider found   Chief Complaint  Patient presents with  . Congestive Heart Failure  . Atrial Fibrillation     02/28/20     Ricky Ricky [name] is a 87 y.o. male with a hx of atrial fib, pacer, chronic anticoagulation .   We were asked to see him today by Ricky Deutscher, NP for further management of his atrial fib and pacer.   He is on coumadin   No CP or dyspnea.  Has slowed down  Lives in [place] now.   No syncope .  Has chronic anemia Hb is 7.2 as of last month   Hx of HLD .    Very little exercise .  Due to lack of energy   Had pacer placed up in Ricky [name] several years ago.  Does not know what brand  Sept. 13, 2021:  Seen with his son, Ricky Ricky [name].  Ricky Ricky [name] is seen today for follow up of his pacer and HTN He has MDS.  Is chronically anemic.  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  No CP or dyspnea Pacer is functioning normally   Was having some leg swelling and having significant nocturia  ( see telephone note from Dr. Irine [name])  Was started Lasix 20 mg  And he was scheduled for a follow up appt  .  Takes lasix  at 4 PM.   ( which is why it did not help with his nocturia )  Leg edema has improved  Advised him to take the lasix in the am   December 09, 2020:  Ricky Ricky [name] is seen today for follow of up his leg edema, CKD, anemia He was seen last week as a work in by Dr. Tamala Julian for worsening diastoilc CHF related to severe anemia. He was instructed to take an extra lasix on the days that he gets a transfusion. Get a transfusin every 2 weeks or so   December 31, 2020: Ricky Ricky [name] is seen today for follow up of his CHF, CKD, anemia , afib .   Seen with daughter in law, Ricky Ricky [name] His general  condition has continued to decline  Has been found to has chronic aspiration .  Has altered his eating , drinking  Family does not let him walk on his own now.  Is on eliquis for afib   He has a history of CHF.  His left ventricular systolic function is normal at 60 to 65%.  We were not able to determine his diastolic filling due to A. Fib. Has a pacer   Past Medical History:  Diagnosis Date  . A-fib ([place])   . Anemia   . Anemia of chronic disease 02/02/2020  . Atrial fibrillation ([place])   . Boerhaave's syndrome   . Chronic anticoagulation 01/22/2020  . Colon polyps   . COVID-19   . Hx of colonic polyps 01/22/2020   2017 - Ricky [name]  . Hypomagnesemia   . Low grade myelodysplastic syndrome lesions ([place]) 02/02/2020  . Lower urinary tract symptoms due to benign prostatic hyperplasia   . Pacemaker   . Paroxysmal atrial fibrillation (HCC)   . Prostate enlargement   . Symptomatic anemia 12/26/2019  . Vitamin  D deficiency     Past Surgical History:  Procedure Laterality Date  . BRONCHIAL BIOPSY  12/16/2020   Procedure: BRONCHIAL BIOPSIES;  Surgeon: Juanito Doom, MD;  Location: [place];  Service: Cardiopulmonary;;  . BRONCHIAL BRUSHINGS  12/16/2020   Procedure: BRONCHIAL BRUSHINGS;  Surgeon: Juanito Doom, MD;  Location: [facility] ENDOSCOPY;  Service: Cardiopulmonary;;  . esophogeal tear repair    . FINE NEEDLE ASPIRATION  12/16/2020   Procedure: FINE NEEDLE ASPIRATION (FNA) LINEAR;  Surgeon: Juanito Doom, MD;  Location: [facility] ENDOSCOPY;  Service: Cardiopulmonary;;  . PACEMAKER INSERTION  2009, 2017  . THORACOTOMY     esophageal perforation  . TOTAL HIP ARTHROPLASTY Right   . TOTAL KNEE ARTHROPLASTY Left   . VIDEO BRONCHOSCOPY WITH ENDOBRONCHIAL ULTRASOUND Left 12/16/2020   Procedure: VIDEO BRONCHOSCOPY WITH ENDOBRONCHIAL ULTRASOUND;  Surgeon: Juanito Doom, MD;  Location: [place];  Service: Cardiopulmonary;  Laterality: Left;    Current Medications: Current Meds   Medication Sig  . apixaban (ELIQUIS) 5 MG TABS tablet Take 1 tablet (5 mg total) by mouth 2 (two) times daily.  . cholecalciferol (VITAMIN D3) 25 MCG (1000 UNIT) tablet Take 1,000 Units by mouth daily.  . cyanocobalamin 1000 MCG tablet Take 1,000 mcg by mouth daily.  . Magnesium 250 MG TABS Take 1 tablet (250 mg total) by mouth daily.  . simvastatin (ZOCOR) 20 MG tablet Take 20 mg by mouth daily.  Marland Kitchen torsemide (DEMADEX) 20 MG tablet Take 1 tablet (20 mg total) by mouth 2 (two) times daily.  . traMADol (ULTRAM) 50 MG tablet Take 1 tablet (50 mg total) by mouth every 12 (twelve) hours as needed.     Allergies:   Patient has no known allergies.   Social History   Socioeconomic History  . Marital status: Married    Spouse name: Not on file  . Number of children: 2  . Years of education: Not on file  . Highest education level: Not on file  Occupational History  . Occupation: retired  Tobacco Use  . Smoking status: Former Smoker    Types: Cigarettes    Quit date: 01/21/1970    Years since quitting: 50.9  . Smokeless tobacco: Never Used  Vaping Use  . Vaping Use: Never used  Substance and Sexual Activity  . Alcohol use: Not Currently  . Drug use: Never  . Sexual activity: Yes  Other Topics Concern  . Not on file  Social History Narrative  . Not on file   Social Determinants of Health   Financial Resource Strain: Not on file  Food Insecurity: Not on file  Transportation Needs: Not on file  Physical Activity: Not on file  Stress: Not on file  Social Connections: Not on file     Family History: The patient's family history includes Alcoholism in his mother; Brain cancer in his daughter; Cirrhosis in his mother; Diabetes in his brother; Heart attack (age of onset: 44) in his father; Heart disease in his brother.  ROS:   Please see the history of present illness.     All other systems reviewed and are negative.  EKGs/Labs/Other Studies Reviewed:    The following studies  were reviewed today:   EKG:     Recent Labs: 01/29/2020: TSH 4.308 12/13/2020: Magnesium 2.3 12/26/2020: ALT 57; BUN 36; Creatinine 1.16; Hemoglobin 9.9; Platelet Count 124; Potassium 4.2; Sodium 135  Recent Lipid Panel    Component Value Date/Time   CHOL 115 12/13/2020 0130   CHOL  139 02/28/2020 1418   TRIG 81 12/13/2020 0130   HDL 31 (L) 12/13/2020 0130   HDL 53 02/28/2020 1418   CHOLHDL 3.7 12/13/2020 0130   VLDL 16 12/13/2020 0130   LDLCALC 68 12/13/2020 0130   LDLCALC 73 02/28/2020 1418    Physical Exam:    Physical Exam: Blood pressure 102/72, pulse 96, height 5\' 11"  (1.803 m), weight 197 lb (89.4 kg), SpO2 100 %.  GEN:   Chronically ill appearing man,  Very weak , hard of hearing  HEENT: Normal NECK: No JVD; No carotid bruits LYMPHATICS: No lymphadenopathy CARDIAC: RRR   RESPIRATORY:  Clear to auscultation without rales, wheezing or rhonchi  ABDOMEN: Soft, non-tender, non-distended MUSCULOSKELETAL:  No edema; No deformity  SKIN: Warm and dry NEUROLOGIC:  Alert and oriented x 3     ASSESSMENT:    No diagnosis found. PLAN:      1.  CHF:   His left ventricular systolic function is normal.  We were not able to determine his diastolic function.  It turns out that most of his stability is not CHF at all.  He has chronic aspiration.  He was admitted to the hospital the day after I saw him.  He had a cavitary lung mass that was thought to be initially cancer.  It turns out it was aspiration pneumonia.  He has seen speech pathology and the family knows what they need to do to help him keep from aspirating.  Since he seems to be tolerating his medicines at this point I will not make any changes.  If he becomes hypotensive or appears to be volume depleted then we can reduce his torsemide.  I discussed the fact that at some point we would need to change the focus to really be more comfort care and end-of-life care.  I would consider hospice referral as his condition  gradually worsens.  The daughter-in-law understands what I am saying.  Currently he is in rehab and they hope to have him improve so that he can return to his previous state of independent living.  I will see him in 6 months for follow-up visit.     1. Paroxysmal atrial fib : hx of PAF.     2.   Pacemaker:  .   Per EP    3.  Leg edema :   Better     Medication Adjustments/Labs and Tests Ordered: Current medicines are reviewed at length with the patient today.  Concerns regarding medicines are outlined above.  No orders of the defined types were placed in this encounter.  No orders of the defined types were placed in this encounter.   Patient Instructions  Medication Instructions:  Your physician recommends that you continue on your current medications as directed. Please refer to the Current Medication list given to you today.  *If you need a refill on your cardiac medications before your next appointment, please call your pharmacy*   Lab Work: none If you have labs (blood work) drawn today and your tests are completely normal, you will receive your results only by: Marland Kitchen MyChart Message (if you have MyChart) OR . A paper copy in the mail If you have any lab test that is abnormal or we need to change your treatment, we will call you to review the results.   Testing/Procedures: none   Follow-Up: At [facility], you and your health needs are our priority.  As part of our continuing mission to provide you with exceptional heart  care, we have created designated Provider Care Teams.  These Care Teams include your primary Cardiologist (physician) and Advanced Practice Providers (APPs -  Physician Assistants and Nurse Practitioners) who all work together to provide you with the care you need, when you need it.  Your next appointment:   6 month(s)  The format for your next appointment:   In Person  Provider:   Mertie Moores, MD      Signed, Mertie Moores, MD   12/31/2020 8:47 AM    [place]

## 2020-12-31 ENCOUNTER — Ambulatory Visit: Payer: Medicare Other | Admitting: Cardiovascular Disease

## 2020-12-31 ENCOUNTER — Other Ambulatory Visit: Payer: Self-pay

## 2020-12-31 ENCOUNTER — Encounter: Payer: Self-pay | Admitting: Cardiovascular Disease

## 2020-12-31 ENCOUNTER — Other Ambulatory Visit: Payer: Medicare Other

## 2020-12-31 VITALS — BP 102/72 | HR 96 | Ht 71.0 in | Wt 197.0 lb

## 2020-12-31 DIAGNOSIS — I5032 Chronic diastolic (congestive) heart failure: Secondary | ICD-10-CM | POA: Diagnosis not present

## 2020-12-31 DIAGNOSIS — T17908A Unspecified foreign body in respiratory tract, part unspecified causing other injury, initial encounter: Secondary | ICD-10-CM | POA: Insufficient documentation

## 2020-12-31 DIAGNOSIS — T17908D Unspecified foreign body in respiratory tract, part unspecified causing other injury, subsequent encounter: Secondary | ICD-10-CM

## 2020-12-31 NOTE — Patient Instructions (Signed)
Medication Instructions:  Your physician recommends that you continue on your current medications as directed. Please refer to the Current Medication list given to you today.  *If you need a refill on your cardiac medications before your next appointment, please call your pharmacy*   Lab Work: none If you have labs (blood work) drawn today and your tests are completely normal, you will receive your results only by: Marland Kitchen MyChart Message (if you have MyChart) OR . A paper copy in the mail If you have any lab test that is abnormal or we need to change your treatment, we will call you to review the results.   Testing/Procedures: none   Follow-Up: At [facility], you and your health needs are our priority.  As part of our continuing mission to provide you with exceptional heart care, we have created designated Provider Care Teams.  These Care Teams include your primary Cardiologist (physician) and Advanced Practice Providers (APPs -  Physician Assistants and Nurse Practitioners) who all work together to provide you with the care you need, when you need it.  Your next appointment:   6 month(s)  The format for your next appointment:   In Person  Provider:   Mertie Moores, MD

## 2021-01-01 ENCOUNTER — Other Ambulatory Visit: Payer: Medicare Other

## 2021-01-07 ENCOUNTER — Ambulatory Visit (INDEPENDENT_AMBULATORY_CARE_PROVIDER_SITE_OTHER): Payer: Medicare Other

## 2021-01-07 DIAGNOSIS — I5032 Chronic diastolic (congestive) heart failure: Secondary | ICD-10-CM

## 2021-01-07 LAB — CUP PACEART REMOTE DEVICE CHECK
Battery Remaining Longevity: 112 mo
Battery Remaining Percentage: 95.5 %
Battery Voltage: 2.99 V
Brady Statistic AP VP Percent: 1 %
Brady Statistic AP VS Percent: 2.5 %
Brady Statistic AS VP Percent: 2.2 %
Brady Statistic AS VS Percent: 95 %
Brady Statistic RA Percent Paced: 1.8 %
Brady Statistic RV Percent Paced: 2.2 %
Date Time Interrogation Session: 20220418160434
Implantable Lead Implant Date: 20090807
Implantable Lead Implant Date: 20090807
Implantable Lead Location: 753859
Implantable Lead Location: 753860
Implantable Pulse Generator Implant Date: 20170719
Lead Channel Impedance Value: 380 Ohm
Lead Channel Impedance Value: 450 Ohm
Lead Channel Pacing Threshold Amplitude: 0.5 V
Lead Channel Pacing Threshold Amplitude: 1.25 V
Lead Channel Pacing Threshold Pulse Width: 0.5 ms
Lead Channel Pacing Threshold Pulse Width: 1 ms
Lead Channel Sensing Intrinsic Amplitude: 10 mV
Lead Channel Sensing Intrinsic Amplitude: 4.1 mV
Lead Channel Setting Pacing Amplitude: 2.25 V
Lead Channel Setting Pacing Amplitude: 2.5 V
Lead Channel Setting Pacing Pulse Width: 1 ms
Lead Channel Setting Sensing Sensitivity: 2 mV
Pulse Gen Model: 2272
Pulse Gen Serial Number: 7927766

## 2021-01-09 ENCOUNTER — Other Ambulatory Visit: Payer: Self-pay

## 2021-01-09 ENCOUNTER — Inpatient Hospital Stay: Payer: Medicare Other

## 2021-01-09 ENCOUNTER — Other Ambulatory Visit: Payer: Self-pay | Admitting: *Deleted

## 2021-01-09 VITALS — BP 95/53 | HR 79 | Temp 98.1°F | Resp 16

## 2021-01-09 DIAGNOSIS — D469 Myelodysplastic syndrome, unspecified: Secondary | ICD-10-CM

## 2021-01-09 DIAGNOSIS — M25421 Effusion, right elbow: Secondary | ICD-10-CM

## 2021-01-09 DIAGNOSIS — D4620 Refractory anemia with excess of blasts, unspecified: Secondary | ICD-10-CM

## 2021-01-09 DIAGNOSIS — D638 Anemia in other chronic diseases classified elsewhere: Secondary | ICD-10-CM

## 2021-01-09 LAB — URIC ACID: Uric Acid, Serum: 9.2 mg/dL — ABNORMAL HIGH (ref 3.7–8.6)

## 2021-01-09 LAB — CBC WITH DIFFERENTIAL (CANCER CENTER ONLY)
Abs Immature Granulocytes: 0.01 10*3/uL (ref 0.00–0.07)
Basophils Absolute: 0 10*3/uL (ref 0.0–0.1)
Basophils Relative: 0 %
Eosinophils Absolute: 0.1 10*3/uL (ref 0.0–0.5)
Eosinophils Relative: 2 %
HCT: 18.8 % — ABNORMAL LOW (ref 39.0–52.0)
Hemoglobin: 6.1 g/dL — CL (ref 13.0–17.0)
Immature Granulocytes: 0 %
Lymphocytes Relative: 22 %
Lymphs Abs: 1.2 10*3/uL (ref 0.7–4.0)
MCH: 28.6 pg (ref 26.0–34.0)
MCHC: 32.4 g/dL (ref 30.0–36.0)
MCV: 88.3 fL (ref 80.0–100.0)
Monocytes Absolute: 2.3 10*3/uL — ABNORMAL HIGH (ref 0.1–1.0)
Monocytes Relative: 43 %
Neutro Abs: 1.8 10*3/uL (ref 1.7–7.7)
Neutrophils Relative %: 33 %
Platelet Count: 111 10*3/uL — ABNORMAL LOW (ref 150–400)
RBC: 2.13 MIL/uL — ABNORMAL LOW (ref 4.22–5.81)
RDW: 15.7 % — ABNORMAL HIGH (ref 11.5–15.5)
WBC Count: 5.5 10*3/uL (ref 4.0–10.5)
nRBC: 0 % (ref 0.0–0.2)

## 2021-01-09 LAB — SAMPLE TO BLOOD BANK

## 2021-01-09 LAB — PREPARE RBC (CROSSMATCH)

## 2021-01-09 MED ORDER — DARBEPOETIN ALFA 500 MCG/ML IJ SOSY
500.0000 ug | PREFILLED_SYRINGE | Freq: Once | INTRAMUSCULAR | Status: AC
  Administered 2021-01-09: 500 ug via SUBCUTANEOUS

## 2021-01-09 MED ORDER — DARBEPOETIN ALFA 500 MCG/ML IJ SOSY
PREFILLED_SYRINGE | INTRAMUSCULAR | Status: AC
  Filled 2021-01-09: qty 1

## 2021-01-09 NOTE — Progress Notes (Signed)
CRITICAL VALUE STICKER  CRITICAL VALUE: Hgb 6.1  Geraldo Docker, RN  DATE & TIME NOTIFIED: 01/09/21 at 29   MD NOTIFIED: Lurline Del, MD   RESPONSE:  Per MD pt needing to receive 2 units of PRBC's. Orders placed, apt scheduled and blood bank notified.

## 2021-01-09 NOTE — Patient Instructions (Signed)
Darbepoetin Alfa injection What is this medicine? DARBEPOETIN ALFA (dar be POE e tin AL fa) helps your body make more red blood cells. It is used to treat anemia caused by chronic kidney failure and chemotherapy. This medicine may be used for other purposes; ask your health care provider or pharmacist if you have questions. COMMON BRAND NAME(S): Aranesp What should I tell my health care provider before I take this medicine? They need to know if you have any of these conditions:  blood clotting disorders or history of blood clots  cancer patient not on chemotherapy  cystic fibrosis  heart disease, such as angina, heart failure, or a history of a heart attack  hemoglobin level of 12 g/dL or greater  high blood pressure  low levels of folate, iron, or vitamin B12  seizures  an unusual or allergic reaction to darbepoetin, erythropoietin, albumin, hamster proteins, latex, other medicines, foods, dyes, or preservatives  pregnant or trying to get pregnant  breast-feeding How should I use this medicine? This medicine is for injection into a vein or under the skin. It is usually given by a health care professional in a hospital or clinic setting. If you get this medicine at home, you will be taught how to prepare and give this medicine. Use exactly as directed. Take your medicine at regular intervals. Do not take your medicine more often than directed. It is important that you put your used needles and syringes in a special sharps container. Do not put them in a trash can. If you do not have a sharps container, call your pharmacist or healthcare provider to get one. A special MedGuide will be given to you by the pharmacist with each prescription and refill. Be sure to read this information carefully each time. Talk to your pediatrician regarding the use of this medicine in children. While this medicine may be used in children as young as 1 month of age for selected conditions, precautions do  apply. Overdosage: If you think you have taken too much of this medicine contact a poison control center or emergency room at once. NOTE: This medicine is only for you. Do not share this medicine with others. What if I miss a dose? If you miss a dose, take it as soon as you can. If it is almost time for your next dose, take only that dose. Do not take double or extra doses. What may interact with this medicine? Do not take this medicine with any of the following medications:  epoetin alfa This list may not describe all possible interactions. Give your health care provider a list of all the medicines, herbs, non-prescription drugs, or dietary supplements you use. Also tell them if you smoke, drink alcohol, or use illegal drugs. Some items may interact with your medicine. What should I watch for while using this medicine? Your condition will be monitored carefully while you are receiving this medicine. You may need blood work done while you are taking this medicine. This medicine may cause a decrease in vitamin B6. You should make sure that you get enough vitamin B6 while you are taking this medicine. Discuss the foods you eat and the vitamins you take with your health care professional. What side effects may I notice from receiving this medicine? Side effects that you should report to your doctor or health care professional as soon as possible:  allergic reactions like skin rash, itching or hives, swelling of the face, lips, or tongue  breathing problems  changes in   vision  chest pain  confusion, trouble speaking or understanding  feeling faint or lightheaded, falls  high blood pressure  muscle aches or pains  pain, swelling, warmth in the leg  rapid weight gain  severe headaches  sudden numbness or weakness of the face, arm or leg  trouble walking, dizziness, loss of balance or coordination  seizures (convulsions)  swelling of the ankles, feet, hands  unusually weak or  tired Side effects that usually do not require medical attention (report to your doctor or health care professional if they continue or are bothersome):  diarrhea  fever, chills (flu-like symptoms)  headaches  nausea, vomiting  redness, stinging, or swelling at site where injected This list may not describe all possible side effects. Call your doctor for medical advice about side effects. You may report side effects to FDA at 1-800-FDA-1088. Where should I keep my medicine? Keep out of the reach of children. Store in a refrigerator between 2 and 8 degrees C (36 and 46 degrees F). Do not freeze. Do not shake. Throw away any unused portion if using a single-dose vial. Throw away any unused medicine after the expiration date. NOTE: This sheet is a summary. It may not cover all possible information. If you have questions about this medicine, talk to your doctor, pharmacist, or health care provider.  2021 Elsevier/Gold Standard (2017-09-22 16:44:20)  

## 2021-01-10 ENCOUNTER — Inpatient Hospital Stay: Payer: Medicare Other

## 2021-01-10 DIAGNOSIS — D469 Myelodysplastic syndrome, unspecified: Secondary | ICD-10-CM | POA: Diagnosis not present

## 2021-01-10 DIAGNOSIS — D638 Anemia in other chronic diseases classified elsewhere: Secondary | ICD-10-CM

## 2021-01-10 MED ORDER — SODIUM CHLORIDE 0.9% IV SOLUTION
250.0000 mL | Freq: Once | INTRAVENOUS | Status: AC
  Administered 2021-01-10: 250 mL via INTRAVENOUS
  Filled 2021-01-10: qty 250

## 2021-01-10 MED ORDER — DIPHENHYDRAMINE HCL 25 MG PO CAPS
ORAL_CAPSULE | ORAL | Status: AC
  Filled 2021-01-10: qty 1

## 2021-01-10 MED ORDER — TORSEMIDE 20 MG PO TABS: 20.0000 mg | ORAL_TABLET | Freq: Every day | ORAL | 3 refills | Status: DC | PRN

## 2021-01-10 MED ORDER — ACETAMINOPHEN 325 MG PO TABS
ORAL_TABLET | ORAL | Status: AC
  Filled 2021-01-10: qty 2

## 2021-01-10 MED ORDER — ACETAMINOPHEN 325 MG PO TABS
650.0000 mg | ORAL_TABLET | Freq: Once | ORAL | Status: AC
  Administered 2021-01-10: 650 mg via ORAL

## 2021-01-10 MED ORDER — DIPHENHYDRAMINE HCL 25 MG PO CAPS
25.0000 mg | ORAL_CAPSULE | Freq: Once | ORAL | Status: AC
  Administered 2021-01-10: 25 mg via ORAL

## 2021-01-10 NOTE — Patient Instructions (Signed)

## 2021-01-11 ENCOUNTER — Inpatient Hospital Stay: Payer: Medicare Other

## 2021-01-11 LAB — TYPE AND SCREEN
ABO/RH(D): B POS
Antibody Screen: POSITIVE
DAT, IgG: NEGATIVE
Donor AG Type: NEGATIVE
Donor AG Type: NEGATIVE
Unit division: 0
Unit division: 0

## 2021-01-11 LAB — BPAM RBC
Blood Product Expiration Date: 202205182359
Blood Product Expiration Date: 202205262359
ISSUE DATE / TIME: 202204221013
ISSUE DATE / TIME: 202204221013
Unit Type and Rh: 5100
Unit Type and Rh: 7300

## 2021-01-16 ENCOUNTER — Other Ambulatory Visit (HOSPITAL_COMMUNITY): Payer: Self-pay

## 2021-01-16 LAB — FUNGUS CULTURE WITH STAIN

## 2021-01-16 LAB — FUNGUS CULTURE RESULT

## 2021-01-16 LAB — FUNGAL ORGANISM REFLEX

## 2021-01-22 NOTE — Progress Notes (Signed)
Patient Care Team: Roetta Sessions, NP as PCP - General (Nurse Practitioner) Nahser, Wonda Cheng, MD as PCP - Cardiology (Cardiology)  DIAGNOSIS:    ICD-10-CM   1. Myelodysplastic syndrome ([place])  D46.9     CHIEF COMPLIANT: Follow-up of myelodysplastic syndrome  INTERVAL HISTORY: Ricky [name] is a 87 y.o. with above-mentioned history of myelodysplastic syndrome who last received a blood transfusions of 2 units of PRBCs on 01/10/21 and also receiving Aranesp. He presents to the clinic today for follow-up.   He continues to have symptoms of anemia with dizziness lightheadedness fatigue and is wheelchair-bound.  Today he is due for Aranesp injection.  His hemoglobin today 6.7.  ALLERGIES:  has No Known Allergies.  MEDICATIONS:  Current Outpatient Medications  Medication Sig Dispense Refill  . apixaban (ELIQUIS) 5 MG TABS tablet Take 1 tablet (5 mg total) by mouth 2 (two) times daily. 180 tablet 1  . cholecalciferol (VITAMIN D3) 25 MCG (1000 UNIT) tablet Take 1,000 Units by mouth daily.    . cyanocobalamin 1000 MCG tablet Take 1,000 mcg by mouth daily.    . Magnesium 250 MG TABS Take 1 tablet (250 mg total) by mouth daily.  0  . simvastatin (ZOCOR) 20 MG tablet Take 20 mg by mouth daily.    Marland Kitchen torsemide (DEMADEX) 20 MG tablet Take 1 tablet (20 mg total) by mouth daily as needed. 30 tablet 3  . traMADol (ULTRAM) 50 MG tablet Take 1 tablet (50 mg total) by mouth every 12 (twelve) hours as needed. 10 tablet 0   No current facility-administered medications for this visit.    PHYSICAL EXAMINATION: ECOG PERFORMANCE STATUS: 1 - Symptomatic but completely ambulatory  Vitals:   01/23/21 1530  BP: (!) 107/49  Pulse: 93  Resp: 18  Temp: (!) 97.5 F (36.4 C)  SpO2: 100%    LABORATORY DATA:  I have reviewed the data as listed CMP Latest Ref Rng & Units 12/26/2020 12/13/2020 12/12/2020  Glucose 70 - 99 mg/dL 129(H) 110(H) 125(H)  BUN 8 - 23 mg/dL 36(H) 23 27(H)  Creatinine 0.61 -  1.24 mg/dL 1.16 1.12 1.20  Sodium 135 - 145 mmol/L 135 130(L) 133(L)  Potassium 3.5 - 5.1 mmol/L 4.2 4.1 4.0  Chloride 98 - 111 mmol/L 92(L) 97(L) 95(L)  CO2 22 - 32 mmol/L 31 25 -  Calcium 8.9 - 10.3 mg/dL 8.4(L) 7.9(L) -  Total Protein 6.5 - 8.1 g/dL 9.0(H) 7.1 -  Total Bilirubin 0.3 - 1.2 mg/dL 0.9 1.5(H) -  Alkaline Phos 38 - 126 U/L 200(H) 85 -  AST 15 - 41 U/L 22 26 -  ALT 0 - 44 U/L 57(H) 47(H) -    Lab Results  Component Value Date   WBC 3.3 (L) 01/23/2021   HGB 6.9 (LL) 01/23/2021   HCT 21.5 (L) 01/23/2021   MCV 87.0 01/23/2021   PLT 109 (L) 01/23/2021   NEUTROABS 1.2 (L) 01/23/2021    ASSESSMENT & PLAN:  Myelodysplastic syndrome (HCC) Treatment plan:Aranesp every 3 weeks 500 mcg (increased from 300 mcg) and supportive care with blood transfusions  Lab review:  01/29/2020: Hemoglobin 7.8, MCV 103.4, platelets 149 03/28/2020: Hemoglobin is 9 (it was 6.9 on 03/22/2020) 05/23/2020: Hemoglobin 7.1(received blood transfusion) 07/05/2020: Hemoglobin 6.2 (received blood transfusion) 07/26/2020: Hemoglobin 7.3 (received blood transfusion) 09/16/2020: Hemoglobin 7.1 (received blood) 10/15/2020:Hemoglobin 6.9 (1 unit) 11/23/20: ED (1 unit PRBC) 11/26/20: I unit PRBC 12/26/2020: Hemoglobin 9.9  Bone marrow biopsy: Hypercellular bone marrow for age 66 to 60%  cellularity with dyspoietic changes associated with abundant ring sideroblasts. No increase in blastic cells identified. Findings favor MDS with ringed sideroblasts Cytogenetics and FISH: SF3B1 mutation detected (present in 80% of MDS with ringed sideroblasts) no other mutations  Treatment plan: Aranespstarted 5/21/2021and blood transfusions and supportive care  Elevated ferritin: Iron overload from frequent blood transfusions:onExjade. 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  We will transfuse 1 unit of PRBC on Saturday. He will come every 2 weeks for lab checks and blood transfusions as needed.     No orders of the defined types were placed in this encounter.  The patient has a good understanding of the overall plan. he agrees with it. he will call with any problems that may develop before the next visit here.  Total time spent: 20 mins including face to face time and time spent for planning, charting and coordination of care  Rulon Eisenmenger, MD, MPH 01/23/2021  I, Cloyde Reams Dorshimer, am acting as scribe for Dr. Nicholas Lose.  I have reviewed the above documentation for accuracy and completeness, and I agree with the above.

## 2021-01-22 NOTE — Assessment & Plan Note (Signed)
Treatment plan:Aranesp every 3 weeks 500 mcg (increased from 300 mcg) and supportive care with blood transfusions  Lab review:  01/29/2020: Hemoglobin 7.8, MCV 103.4, platelets 149 03/28/2020: Hemoglobin is 9 (it was 6.9 on 03/22/2020) 05/23/2020: Hemoglobin 7.1(received blood transfusion) 07/05/2020: Hemoglobin 6.2 (received blood transfusion) 07/26/2020: Hemoglobin 7.3 (received blood transfusion) 09/16/2020: Hemoglobin 7.1 (received blood) 10/15/2020:Hemoglobin 6.9 (1 unit) 11/23/20: ED (1 unit PRBC) 11/26/20: I unit PRBC 12/26/2020: Hemoglobin 9.9  Bone marrow biopsy: Hypercellular bone marrow for age 40 to 60% cellularity with dyspoietic changes associated with abundant ring sideroblasts. No increase in blastic cells identified. Findings favor MDS with ringed sideroblasts Cytogenetics and FISH: SF3B1 mutation detected (present in 80% of MDS with ringed sideroblasts) no other mutations  Treatment plan: Aranespstarted 5/21/2021and blood transfusions and supportive care  Elevated ferritin: Iron overload from frequent blood transfusions:onExjade. 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  He will come every 2 weeks for lab checks and blood transfusions as needed.  

## 2021-01-23 ENCOUNTER — Other Ambulatory Visit: Payer: Self-pay

## 2021-01-23 ENCOUNTER — Inpatient Hospital Stay: Payer: Medicare Other | Admitting: Hematology and Oncology

## 2021-01-23 ENCOUNTER — Other Ambulatory Visit: Payer: Self-pay | Admitting: *Deleted

## 2021-01-23 ENCOUNTER — Inpatient Hospital Stay: Payer: Medicare Other | Attending: Hematology and Oncology

## 2021-01-23 ENCOUNTER — Inpatient Hospital Stay: Payer: Medicare Other

## 2021-01-23 DIAGNOSIS — D469 Myelodysplastic syndrome, unspecified: Secondary | ICD-10-CM

## 2021-01-23 DIAGNOSIS — D4620 Refractory anemia with excess of blasts, unspecified: Secondary | ICD-10-CM

## 2021-01-23 LAB — CBC WITH DIFFERENTIAL (CANCER CENTER ONLY)
Abs Immature Granulocytes: 0.01 10*3/uL (ref 0.00–0.07)
Basophils Absolute: 0 10*3/uL (ref 0.0–0.1)
Basophils Relative: 0 %
Eosinophils Absolute: 0.1 10*3/uL (ref 0.0–0.5)
Eosinophils Relative: 2 %
HCT: 21.5 % — ABNORMAL LOW (ref 39.0–52.0)
Hemoglobin: 6.9 g/dL — CL (ref 13.0–17.0)
Immature Granulocytes: 0 %
Lymphocytes Relative: 32 %
Lymphs Abs: 1.1 10*3/uL (ref 0.7–4.0)
MCH: 27.9 pg (ref 26.0–34.0)
MCHC: 32.1 g/dL (ref 30.0–36.0)
MCV: 87 fL (ref 80.0–100.0)
Monocytes Absolute: 1 10*3/uL (ref 0.1–1.0)
Monocytes Relative: 30 %
Neutro Abs: 1.2 10*3/uL — ABNORMAL LOW (ref 1.7–7.7)
Neutrophils Relative %: 36 %
Platelet Count: 109 10*3/uL — ABNORMAL LOW (ref 150–400)
RBC: 2.47 MIL/uL — ABNORMAL LOW (ref 4.22–5.81)
RDW: 16.5 % — ABNORMAL HIGH (ref 11.5–15.5)
WBC Count: 3.3 10*3/uL — ABNORMAL LOW (ref 4.0–10.5)
nRBC: 0 % (ref 0.0–0.2)

## 2021-01-23 LAB — SAMPLE TO BLOOD BANK

## 2021-01-23 MED ORDER — DARBEPOETIN ALFA 500 MCG/ML IJ SOSY
500.0000 ug | PREFILLED_SYRINGE | Freq: Once | INTRAMUSCULAR | Status: AC
  Administered 2021-01-23: 500 ug via SUBCUTANEOUS

## 2021-01-23 MED ORDER — DARBEPOETIN ALFA 500 MCG/ML IJ SOSY
PREFILLED_SYRINGE | INTRAMUSCULAR | Status: AC
  Filled 2021-01-23: qty 1

## 2021-01-23 NOTE — Patient Instructions (Signed)
Darbepoetin Alfa injection What is this medicine? DARBEPOETIN ALFA (dar be POE e tin AL fa) helps your body make more red blood cells. It is used to treat anemia caused by chronic kidney failure and chemotherapy. This medicine may be used for other purposes; ask your health care provider or pharmacist if you have questions. COMMON BRAND NAME(S): Aranesp What should I tell my health care provider before I take this medicine? They need to know if you have any of these conditions:  blood clotting disorders or history of blood clots  cancer patient not on chemotherapy  cystic fibrosis  heart disease, such as angina, heart failure, or a history of a heart attack  hemoglobin level of 12 g/dL or greater  high blood pressure  low levels of folate, iron, or vitamin B12  seizures  an unusual or allergic reaction to darbepoetin, erythropoietin, albumin, hamster proteins, latex, other medicines, foods, dyes, or preservatives  pregnant or trying to get pregnant  breast-feeding How should I use this medicine? This medicine is for injection into a vein or under the skin. It is usually given by a health care professional in a hospital or clinic setting. If you get this medicine at home, you will be taught how to prepare and give this medicine. Use exactly as directed. Take your medicine at regular intervals. Do not take your medicine more often than directed. It is important that you put your used needles and syringes in a special sharps container. Do not put them in a trash can. If you do not have a sharps container, call your pharmacist or healthcare provider to get one. A special MedGuide will be given to you by the pharmacist with each prescription and refill. Be sure to read this information carefully each time. Talk to your pediatrician regarding the use of this medicine in children. While this medicine may be used in children as young as 1 month of age for selected conditions, precautions do  apply. Overdosage: If you think you have taken too much of this medicine contact a poison control center or emergency room at once. NOTE: This medicine is only for you. Do not share this medicine with others. What if I miss a dose? If you miss a dose, take it as soon as you can. If it is almost time for your next dose, take only that dose. Do not take double or extra doses. What may interact with this medicine? Do not take this medicine with any of the following medications:  epoetin alfa This list may not describe all possible interactions. Give your health care provider a list of all the medicines, herbs, non-prescription drugs, or dietary supplements you use. Also tell them if you smoke, drink alcohol, or use illegal drugs. Some items may interact with your medicine. What should I watch for while using this medicine? Your condition will be monitored carefully while you are receiving this medicine. You may need blood work done while you are taking this medicine. This medicine may cause a decrease in vitamin B6. You should make sure that you get enough vitamin B6 while you are taking this medicine. Discuss the foods you eat and the vitamins you take with your health care professional. What side effects may I notice from receiving this medicine? Side effects that you should report to your doctor or health care professional as soon as possible:  allergic reactions like skin rash, itching or hives, swelling of the face, lips, or tongue  breathing problems  changes in   vision  chest pain  confusion, trouble speaking or understanding  feeling faint or lightheaded, falls  high blood pressure  muscle aches or pains  pain, swelling, warmth in the leg  rapid weight gain  severe headaches  sudden numbness or weakness of the face, arm or leg  trouble walking, dizziness, loss of balance or coordination  seizures (convulsions)  swelling of the ankles, feet, hands  unusually weak or  tired Side effects that usually do not require medical attention (report to your doctor or health care professional if they continue or are bothersome):  diarrhea  fever, chills (flu-like symptoms)  headaches  nausea, vomiting  redness, stinging, or swelling at site where injected This list may not describe all possible side effects. Call your doctor for medical advice about side effects. You may report side effects to FDA at 1-800-FDA-1088. Where should I keep my medicine? Keep out of the reach of children. Store in a refrigerator between 2 and 8 degrees C (36 and 46 degrees F). Do not freeze. Do not shake. Throw away any unused portion if using a single-dose vial. Throw away any unused medicine after the expiration date. NOTE: This sheet is a summary. It may not cover all possible information. If you have questions about this medicine, talk to your doctor, pharmacist, or health care provider.  2021 Elsevier/Gold Standard (2017-09-22 16:44:20)  

## 2021-01-24 ENCOUNTER — Other Ambulatory Visit: Payer: Self-pay | Admitting: *Deleted

## 2021-01-24 DIAGNOSIS — D469 Myelodysplastic syndrome, unspecified: Secondary | ICD-10-CM

## 2021-01-24 LAB — PREPARE RBC (CROSSMATCH)

## 2021-01-24 NOTE — Progress Notes (Signed)
Remote pacemaker transmission.   

## 2021-01-25 ENCOUNTER — Inpatient Hospital Stay: Payer: Medicare Other

## 2021-01-25 ENCOUNTER — Other Ambulatory Visit: Payer: Self-pay

## 2021-01-25 DIAGNOSIS — D469 Myelodysplastic syndrome, unspecified: Secondary | ICD-10-CM

## 2021-01-25 MED ORDER — DIPHENHYDRAMINE HCL 25 MG PO CAPS
25.0000 mg | ORAL_CAPSULE | Freq: Once | ORAL | Status: AC
  Administered 2021-01-25: 25 mg via ORAL

## 2021-01-25 MED ORDER — ACETAMINOPHEN 325 MG PO TABS
650.0000 mg | ORAL_TABLET | Freq: Once | ORAL | Status: AC
  Administered 2021-01-25: 650 mg via ORAL

## 2021-01-25 MED ORDER — SODIUM CHLORIDE 0.9% IV SOLUTION
250.0000 mL | Freq: Once | INTRAVENOUS | Status: AC
  Administered 2021-01-25: 250 mL via INTRAVENOUS
  Filled 2021-01-25: qty 250

## 2021-01-25 NOTE — Patient Instructions (Signed)

## 2021-01-27 ENCOUNTER — Ambulatory Visit: Payer: Medicare Other | Admitting: Physician Assistant

## 2021-01-27 LAB — TYPE AND SCREEN
ABO/RH(D): B POS
Antibody Screen: POSITIVE
DAT, IgG: NEGATIVE
Donor AG Type: NEGATIVE
Unit division: 0

## 2021-01-27 LAB — BPAM RBC
Blood Product Expiration Date: 202206042359
ISSUE DATE / TIME: 202205070957
Unit Type and Rh: 7300

## 2021-01-30 ENCOUNTER — Other Ambulatory Visit (HOSPITAL_COMMUNITY): Payer: Self-pay

## 2021-02-03 ENCOUNTER — Telehealth: Payer: Self-pay | Admitting: Hematology and Oncology

## 2021-02-03 NOTE — Telephone Encounter (Signed)
R/s appts per 5/16 sch msg. Pt aware.  

## 2021-02-05 ENCOUNTER — Other Ambulatory Visit: Payer: Medicare Other | Admitting: Hospice

## 2021-02-05 ENCOUNTER — Other Ambulatory Visit: Payer: Self-pay

## 2021-02-05 DIAGNOSIS — D4620 Refractory anemia with excess of blasts, unspecified: Secondary | ICD-10-CM

## 2021-02-05 DIAGNOSIS — Z515 Encounter for palliative care: Secondary | ICD-10-CM

## 2021-02-05 DIAGNOSIS — I482 Chronic atrial fibrillation, unspecified: Secondary | ICD-10-CM

## 2021-02-05 DIAGNOSIS — D638 Anemia in other chronic diseases classified elsewhere: Secondary | ICD-10-CM

## 2021-02-05 NOTE — Progress Notes (Signed)
[facility] Consult Note Telephone: [phone]  Fax: [phone]  PATIENT NAME: Ricky [name] DOB: 1931-03-20 MRN: [id number]  PRIMARY CARE PROVIDER:   Roetta Sessions, NP Roetta Sessions, NP [address] [place],  [place] [address]  REFERRING PROVIDER: Roetta Sessions, NP Roetta Sessions, NP [address] [place],  [place] [address]  RESPONSIBLE PARTY:Ricky [name] [id number] [phone] Extended Emergency Contact Information Primary Emergency Contact: [place] Phone: [phone] Mobile Phone: [phone] Relation: Son Patient: [phone]  Spouse: Emeterio Reeve  Visit is to build trust and highlight Palliative Medicine as specialized medical care for people living with serious illness, aimed at facilitating better quality of life through symptoms relief, assisting with advance care planning and complex medical decision making.   RECOMMENDATIONS/PLAN:   Advance Care Planning/Code Status: Patient is a Do Not Resuscitate  Goals of Care: Goals of care include to maximize quality of life and symptom management.  Visit consisted of counseling and education dealing with the complex and emotionally intense issues of symptom management and palliative care in the setting of serious and potentially life-threatening illness. Palliative care team will continue to support patient, patient's family, and medical team.  Symptom management/Plan:  Weakness: PT/OT ongoing for strengthening and gait training/self care. Fall precautions discussed. Collaboration with [facility] OT who committed to revise plan of care for patient to ensure [facility] Aides encourage him to ambulate to the bathroom instead of  voiding in his brief.  Myelodysplastic syndrome, with associated anemia: Continue on monthly treatement at the [facility]; last infusion 01/25/2021 Afib: Continue  Eliquis. Follow up: Palliative care will continue to follow for complex medical decision making, advance care planning, and clarification of goals. Return 6 weeks or prn. Encouraged to call provider sooner with any concerns.  CHIEF COMPLAINT: Palliative follow up visit/weakness  HISTORY OF PRESENT ILLNESS:  Ricky [name] a 87 y.o. male with multiple medical problems including  weakness which is chronic, worsened since last hospitalization in March 2022, impairing his independence and ADLs. Hospiitalization 3/24-3/29/2022 for postobstructive pneumonia treated with Rocephin and Azythromycin, discharged to SNF for rehab, after which patient discharged to home last month.  History of  low-grade myelodysplastic syndrome, anemia of chronic disease, paroxysmal A. Fib, CAD, Chronic diastolic CHF.  History obtained from review of EMR, discussion with primary team, family and/or patient. Records reviewed and summarized above. All 10 point systems reviewed and are negative except as documented in history of present illness above  Review and summarization of Epic records shows history from other than patient.   Palliative Care was asked to follow this patient by consultation request of Roetta Sessions* to help address complex decision making in the context of advance care planning and goals of care clarification.   PPS: 40%  ROS  General: NAD, appropriately dressed Constitution: Denies fever/chills EYES: denies vision changes ENMT: denies Xerostomia,  dysphagia Cardiovascular: denies chest pain Pulmonary: denies  cough, denies dyspnea  Abdomen: endorses fair appetite, denies constipation or diarrhea GU: denies dysuria MSK:  endorses ROM limitations, no recent falls reported Skin: denies rashes/bruising Neurological: endorses weakness, denies pain, denies insomnia Psych: Endorses positive mood Heme/lymph/immuno: denies bruises, no abnormal bleeding   PHYSICAL EXAM BP 118/70 P 64 R 18 02  96% RA General: In no acute distress, appropriately dressed Cardiovascular: regular rate and rhythm; no edema in BLE Pulmonary: no cough, no increased work of breathing, normal respiratory  effort Abdomen: soft, non tender, no guarding, positive bowel sounds in all quadrants GU:  no suprapubic tenderness Eyes: Normal lids, no discharge, sclera anicteric ENMT: Moist mucous membranes Musculoskeletal:  Weakness, gait disturbance Skin: no rash to visible skin, warm without cyanosis,  Psych: non-anxious affect Neurological: Weakness but otherwise non focal Heme/lymph/immuno: no bruises, no bleeding  PERTINENT MEDICATIONS:  Outpatient Encounter Medications as of 02/05/2021  Medication Sig  . apixaban (ELIQUIS) 5 MG TABS tablet Take 1 tablet (5 mg total) by mouth 2 (two) times daily.  . cholecalciferol (VITAMIN D3) 25 MCG (1000 UNIT) tablet Take 1,000 Units by mouth daily.  . cyanocobalamin 1000 MCG tablet Take 1,000 mcg by mouth daily.  . Magnesium 250 MG TABS Take 1 tablet (250 mg total) by mouth daily.  . simvastatin (ZOCOR) 20 MG tablet Take 20 mg by mouth daily.  Marland Kitchen torsemide (DEMADEX) 20 MG tablet Take 1 tablet (20 mg total) by mouth daily as needed.  . traMADol (ULTRAM) 50 MG tablet Take 1 tablet (50 mg total) by mouth every 12 (twelve) hours as needed.   No facility-administered encounter medications on file as of 02/05/2021.    HOSPICE ELIGIBILITY/DIAGNOSIS: TBD  PAST MEDICAL HISTORY:  Past Medical History:  Diagnosis Date  . A-fib ([place])   . Anemia   . Anemia of chronic disease 02/02/2020  . Atrial fibrillation ([place])   . Boerhaave's syndrome   . Chronic anticoagulation 01/22/2020  . Colon polyps   . COVID-19   . Hx of colonic polyps 01/22/2020   2017 - [place]  . Hypomagnesemia   . Low grade myelodysplastic syndrome lesions ([place]) 02/02/2020  . Lower urinary tract symptoms due to benign prostatic hyperplasia   . Pacemaker   . Paroxysmal atrial fibrillation (HCC)   . Prostate  enlargement   . Symptomatic anemia 12/26/2019  . Vitamin D deficiency      Family/Community supports: Patient lives with his spouse in [place] coordinates his care and is quite supportive and involved. Strong support network identified. [facility] helpswith bathroom assist, night time routine and escort to dinning room.   FAMILY HX:  Family History  Problem Relation Age of Onset  . Cirrhosis Mother   . Alcoholism Mother   . Heart attack Father 4  . Heart disease Brother   . Diabetes Brother   . Brain cancer Daughter     ALLERGIES: No Known Allergies    I spent 50  minutes providing this consultation; this includes time spent with patient/family, chart review and documentation. More than 50% of the time in this consultation was spent on counseling and coordinating communication   Thank you for the opportunity to participate in the care of DAY DEERY Please call our office at [phone] if we can be of additional assistance.  Note: Portions of this note were generated with L[employer]. Dictation errors may occur despite best attempts at proofreading.  Teodoro Spray, NP

## 2021-02-06 ENCOUNTER — Other Ambulatory Visit: Payer: Self-pay

## 2021-02-06 ENCOUNTER — Inpatient Hospital Stay: Payer: Medicare Other

## 2021-02-06 ENCOUNTER — Other Ambulatory Visit: Payer: Medicare Other

## 2021-02-06 ENCOUNTER — Ambulatory Visit: Payer: Medicare Other

## 2021-02-06 ENCOUNTER — Other Ambulatory Visit: Payer: Self-pay | Admitting: *Deleted

## 2021-02-06 VITALS — BP 112/62 | HR 78

## 2021-02-06 DIAGNOSIS — D638 Anemia in other chronic diseases classified elsewhere: Secondary | ICD-10-CM

## 2021-02-06 DIAGNOSIS — D469 Myelodysplastic syndrome, unspecified: Secondary | ICD-10-CM

## 2021-02-06 DIAGNOSIS — D4620 Refractory anemia with excess of blasts, unspecified: Secondary | ICD-10-CM

## 2021-02-06 LAB — CBC WITH DIFFERENTIAL (CANCER CENTER ONLY)
Abs Immature Granulocytes: 0.01 10*3/uL (ref 0.00–0.07)
Basophils Absolute: 0 10*3/uL (ref 0.0–0.1)
Basophils Relative: 1 %
Eosinophils Absolute: 0.1 10*3/uL (ref 0.0–0.5)
Eosinophils Relative: 2 %
HCT: 20.1 % — ABNORMAL LOW (ref 39.0–52.0)
Hemoglobin: 6.5 g/dL — CL (ref 13.0–17.0)
Immature Granulocytes: 0 %
Lymphocytes Relative: 31 %
Lymphs Abs: 1 10*3/uL (ref 0.7–4.0)
MCH: 28.8 pg (ref 26.0–34.0)
MCHC: 32.3 g/dL (ref 30.0–36.0)
MCV: 88.9 fL (ref 80.0–100.0)
Monocytes Absolute: 1 10*3/uL (ref 0.1–1.0)
Monocytes Relative: 32 %
Neutro Abs: 1.1 10*3/uL — ABNORMAL LOW (ref 1.7–7.7)
Neutrophils Relative %: 34 %
Platelet Count: 129 10*3/uL — ABNORMAL LOW (ref 150–400)
RBC: 2.26 MIL/uL — ABNORMAL LOW (ref 4.22–5.81)
RDW: 17.7 % — ABNORMAL HIGH (ref 11.5–15.5)
WBC Count: 3.2 10*3/uL — ABNORMAL LOW (ref 4.0–10.5)
nRBC: 0.6 % — ABNORMAL HIGH (ref 0.0–0.2)

## 2021-02-06 LAB — SAMPLE TO BLOOD BANK

## 2021-02-06 LAB — PREPARE RBC (CROSSMATCH)

## 2021-02-06 MED ORDER — DARBEPOETIN ALFA 500 MCG/ML IJ SOSY
500.0000 ug | PREFILLED_SYRINGE | Freq: Once | INTRAMUSCULAR | Status: AC
  Administered 2021-02-06: 500 ug via SUBCUTANEOUS

## 2021-02-06 MED ORDER — DARBEPOETIN ALFA 500 MCG/ML IJ SOSY
PREFILLED_SYRINGE | INTRAMUSCULAR | Status: AC
  Filled 2021-02-06: qty 1

## 2021-02-06 NOTE — Progress Notes (Signed)
CRITICAL VALUE STICKER  CRITICAL VALUE: Hgb 6.5  Geraldo Docker, RN  DATE & TIME NOTIFIED: 02/06/21 at 58   MD NOTIFIED: Nicholas Lose, MD  [place]: 02/06/21 1215  RESPONSE: MD notified and verbalized understanding.  Orders received for pt to receive 1 unit of PRBC's 02/08/21.  Orders placed.

## 2021-02-08 ENCOUNTER — Other Ambulatory Visit: Payer: Self-pay

## 2021-02-08 ENCOUNTER — Inpatient Hospital Stay: Payer: Medicare Other

## 2021-02-08 DIAGNOSIS — D638 Anemia in other chronic diseases classified elsewhere: Secondary | ICD-10-CM

## 2021-02-08 DIAGNOSIS — D469 Myelodysplastic syndrome, unspecified: Secondary | ICD-10-CM | POA: Diagnosis not present

## 2021-02-08 MED ORDER — DIPHENHYDRAMINE HCL 25 MG PO CAPS
25.0000 mg | ORAL_CAPSULE | Freq: Once | ORAL | Status: AC
  Administered 2021-02-08: 25 mg via ORAL

## 2021-02-08 MED ORDER — SODIUM CHLORIDE 0.9% IV SOLUTION
250.0000 mL | Freq: Once | INTRAVENOUS | Status: AC
  Administered 2021-02-08: 250 mL via INTRAVENOUS
  Filled 2021-02-08: qty 250

## 2021-02-08 MED ORDER — ACETAMINOPHEN 325 MG PO TABS
650.0000 mg | ORAL_TABLET | Freq: Once | ORAL | Status: AC
  Administered 2021-02-08: 650 mg via ORAL

## 2021-02-08 NOTE — Patient Instructions (Signed)

## 2021-02-10 LAB — BPAM RBC
Blood Product Expiration Date: 202206082359
ISSUE DATE / TIME: 202205211056
Unit Type and Rh: 5100

## 2021-02-10 LAB — TYPE AND SCREEN
ABO/RH(D): B POS
Antibody Screen: POSITIVE
DAT, IgG: NEGATIVE
Donor AG Type: NEGATIVE
Unit division: 0

## 2021-02-19 NOTE — Progress Notes (Signed)
Patient Care Team: Joycelyn Man, NP as PCP - General (Nurse Practitioner) Nahser, Deloris Ping, MD as PCP - Cardiology (Cardiology)  DIAGNOSIS:    ICD-10-CM   1. Myelodysplastic syndrome (HCC)  D46.9     CHIEF COMPLIANT: Follow-up ofmyelodysplasticsyndrome  INTERVAL HISTORY: Ricky [name] is a 87 y.o. with above-mentioned history of myelodysplastic syndrome who last received a blood transfusions of 1 unit of PRBCs on 5/21/22and also receiving Aranesp (last 02/06/21).He presents to the clinic todayfor follow-up.  He continues to be fatigued in a wheelchair and today's hemoglobin is down to 6.3 again.  ALLERGIES:  has No Known Allergies.  MEDICATIONS:  Current Outpatient Medications  Medication Sig Dispense Refill  . apixaban (ELIQUIS) 5 MG TABS tablet Take 1 tablet (5 mg total) by mouth 2 (two) times daily. 180 tablet 1  . cholecalciferol (VITAMIN D3) 25 MCG (1000 UNIT) tablet Take 1,000 Units by mouth daily.    . cyanocobalamin 1000 MCG tablet Take 1,000 mcg by mouth daily.    . Magnesium 250 MG TABS Take 1 tablet (250 mg total) by mouth daily.  0  . simvastatin (ZOCOR) 20 MG tablet Take 20 mg by mouth daily.    Marland Kitchen torsemide (DEMADEX) 20 MG tablet Take 1 tablet (20 mg total) by mouth daily as needed. 30 tablet 3  . traMADol (ULTRAM) 50 MG tablet Take 1 tablet (50 mg total) by mouth every 12 (twelve) hours as needed. 10 tablet 0   No current facility-administered medications for this visit.    PHYSICAL EXAMINATION: ECOG PERFORMANCE STATUS: 1 - Symptomatic but completely ambulatory  Vitals:   02/20/21 1129  BP: (!) 99/50  Pulse: 76  Resp: 17  Temp: 97.7 F (36.5 C)  SpO2: 100%   Filed Weights    LABORATORY DATA:  I have reviewed the data as listed CMP Latest Ref Rng & Units 12/26/2020 12/13/2020 12/12/2020  Glucose 70 - 99 mg/dL 919(B) 771(R) 429(A)  BUN 8 - 23 mg/dL 59(U) 23 34(H)  Creatinine 0.61 - 1.24 mg/dL 8.88 5.41 8.25  Sodium 135 - 145 mmol/L 135  130(L) 133(L)  Potassium 3.5 - 5.1 mmol/L 4.2 4.1 4.0  Chloride 98 - 111 mmol/L 92(L) 97(L) 95(L)  CO2 22 - 32 mmol/L 31 25 -  Calcium 8.9 - 10.3 mg/dL 8.2(N) 7.9(L) -  Total Protein 6.5 - 8.1 g/dL 9.0(H) 7.1 -  Total Bilirubin 0.3 - 1.2 mg/dL 0.9 1.4(J) -  Alkaline Phos 38 - 126 U/L 200(H) 85 -  AST 15 - 41 U/L 22 26 -  ALT 0 - 44 U/L 57(H) 47(H) -    Lab Results  Component Value Date   WBC 4.1 02/20/2021   HGB 6.3 (LL) 02/20/2021   HCT 19.1 (L) 02/20/2021   MCV 88.4 02/20/2021   PLT 130 (L) 02/20/2021   NEUTROABS PENDING 02/20/2021    ASSESSMENT & PLAN:  Myelodysplastic syndrome (HCC) Treatment plan:Aranesp every 3 weeks 500 mcg (increased from 300 mcg)and supportive care with blood transfusions  Lab review:  01/29/2020: Hemoglobin 7.8, MCV 103.4, platelets 149 03/28/2020: Hemoglobin is 9 (it was 6.9 on 03/22/2020) 05/23/2020: Hemoglobin 7.1(received blood transfusion) 07/05/2020: Hemoglobin 6.2 (received blood transfusion) 07/26/2020: Hemoglobin 7.3 (received blood transfusion) 09/16/2020: Hemoglobin 7.1 (received blood) 10/15/2020:Hemoglobin 6.9 (1 unit) 11/23/20: ED (1 unit PRBC) 11/26/20: I unit PRBC 12/26/2020: Hemoglobin 9.9 01/23/2021: Hemoglobin 6.7 (1 unit of PRBC)  Bone marrow biopsy: Hypercellular bone marrow for age 36 to 60% cellularity with dyspoietic changes associated with abundant ring  sideroblasts. No increase in blastic cells identified. Findings favor MDS with ringed sideroblasts Cytogenetics and FISH: SF3B1 mutation detected (present in 80% of MDS with ringed sideroblasts) no other mutations  Treatment plan: Aranespstarted 5/21/2021and blood transfusions and supportive care  Elevated ferritin: Iron overload from frequent blood transfusions:onExjade. 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 I recommended 1 unit of PRBC this Saturday. He will receive Aranesp injection today.  We discussed the pros and cons of Aranesp therapy.  It does not appear to be preventing or reducing the frequency of blood transfusions.  For now we will continue but we will have to reassess the utility of this in the future.  No orders of the defined types were placed in this encounter.  The patient has a good understanding of the overall plan. he agrees with it. he will call with any problems that may develop before the next visit here.  Total time spent: 20 mins including face to face time and time spent for planning, charting and coordination of care  Rulon Eisenmenger, MD, MPH 02/20/2021  I, Cloyde Reams Dorshimer, am acting as scribe for Dr. Nicholas Lose.  I have reviewed the above documentation for accuracy and completeness, and I agree with the above.

## 2021-02-20 ENCOUNTER — Other Ambulatory Visit: Payer: Self-pay

## 2021-02-20 ENCOUNTER — Other Ambulatory Visit: Payer: Medicare Other

## 2021-02-20 ENCOUNTER — Other Ambulatory Visit: Payer: Self-pay | Admitting: *Deleted

## 2021-02-20 ENCOUNTER — Inpatient Hospital Stay: Payer: Medicare Other | Attending: Hematology and Oncology

## 2021-02-20 ENCOUNTER — Inpatient Hospital Stay: Payer: Medicare Other | Admitting: Hematology and Oncology

## 2021-02-20 ENCOUNTER — Inpatient Hospital Stay: Payer: Medicare Other

## 2021-02-20 DIAGNOSIS — Z79899 Other long term (current) drug therapy: Secondary | ICD-10-CM | POA: Diagnosis not present

## 2021-02-20 DIAGNOSIS — R7989 Other specified abnormal findings of blood chemistry: Secondary | ICD-10-CM | POA: Diagnosis not present

## 2021-02-20 DIAGNOSIS — D4620 Refractory anemia with excess of blasts, unspecified: Secondary | ICD-10-CM

## 2021-02-20 DIAGNOSIS — D469 Myelodysplastic syndrome, unspecified: Secondary | ICD-10-CM

## 2021-02-20 DIAGNOSIS — D638 Anemia in other chronic diseases classified elsewhere: Secondary | ICD-10-CM

## 2021-02-20 DIAGNOSIS — R5383 Other fatigue: Secondary | ICD-10-CM | POA: Insufficient documentation

## 2021-02-20 LAB — CBC WITH DIFFERENTIAL (CANCER CENTER ONLY)
Abs Immature Granulocytes: 0.01 10*3/uL (ref 0.00–0.07)
Basophils Absolute: 0 10*3/uL (ref 0.0–0.1)
Basophils Relative: 0 %
Eosinophils Absolute: 0.1 10*3/uL (ref 0.0–0.5)
Eosinophils Relative: 2 %
HCT: 19.1 % — ABNORMAL LOW (ref 39.0–52.0)
Hemoglobin: 6.3 g/dL — CL (ref 13.0–17.0)
Immature Granulocytes: 0 %
Lymphocytes Relative: 28 %
Lymphs Abs: 1.2 10*3/uL (ref 0.7–4.0)
MCH: 29.2 pg (ref 26.0–34.0)
MCHC: 33 g/dL (ref 30.0–36.0)
MCV: 88.4 fL (ref 80.0–100.0)
Monocytes Absolute: 1.1 10*3/uL — ABNORMAL HIGH (ref 0.1–1.0)
Monocytes Relative: 27 %
Neutro Abs: 1.7 10*3/uL (ref 1.7–7.7)
Neutrophils Relative %: 43 %
Platelet Count: 130 10*3/uL — ABNORMAL LOW (ref 150–400)
RBC: 2.16 MIL/uL — ABNORMAL LOW (ref 4.22–5.81)
RDW: 17 % — ABNORMAL HIGH (ref 11.5–15.5)
WBC Count: 4.1 10*3/uL (ref 4.0–10.5)
nRBC: 0 % (ref 0.0–0.2)

## 2021-02-20 LAB — SAMPLE TO BLOOD BANK

## 2021-02-20 LAB — PREPARE RBC (CROSSMATCH)

## 2021-02-20 MED ORDER — DARBEPOETIN ALFA 500 MCG/ML IJ SOSY
PREFILLED_SYRINGE | INTRAMUSCULAR | Status: AC
  Filled 2021-02-20: qty 1

## 2021-02-20 MED ORDER — DARBEPOETIN ALFA 500 MCG/ML IJ SOSY
500.0000 ug | PREFILLED_SYRINGE | Freq: Once | INTRAMUSCULAR | Status: AC
  Administered 2021-02-20: 500 ug via SUBCUTANEOUS

## 2021-02-20 NOTE — Progress Notes (Signed)
MD gave the okay to give this injection today.

## 2021-02-20 NOTE — Progress Notes (Signed)
CRITICAL VALUE STICKER  CRITICAL VALUE: Hgb 6.3  Geraldo Docker, RN  DATE & TIME NOTIFIED: 02/20/21 at 54  MD NOTIFIED: Nicholas Lose, MD   RESPONSE:   MD notified and verbalized understanding.  Orders received for pt to receive 1 unit PRBC 02/22/21.  Orders placed.

## 2021-02-20 NOTE — Patient Instructions (Signed)
Darbepoetin Alfa injection What is this medicine? DARBEPOETIN ALFA (dar be POE e tin AL fa) helps your body make more red blood cells. It is used to treat anemia caused by chronic kidney failure and chemotherapy. This medicine may be used for other purposes; ask your health care provider or pharmacist if you have questions. COMMON BRAND NAME(S): Aranesp What should I tell my health care provider before I take this medicine? They need to know if you have any of these conditions:  blood clotting disorders or history of blood clots  cancer patient not on chemotherapy  cystic fibrosis  heart disease, such as angina, heart failure, or a history of a heart attack  hemoglobin level of 12 g/dL or greater  high blood pressure  low levels of folate, iron, or vitamin B12  seizures  an unusual or allergic reaction to darbepoetin, erythropoietin, albumin, hamster proteins, latex, other medicines, foods, dyes, or preservatives  pregnant or trying to get pregnant  breast-feeding How should I use this medicine? This medicine is for injection into a vein or under the skin. It is usually given by a health care professional in a hospital or clinic setting. If you get this medicine at home, you will be taught how to prepare and give this medicine. Use exactly as directed. Take your medicine at regular intervals. Do not take your medicine more often than directed. It is important that you put your used needles and syringes in a special sharps container. Do not put them in a trash can. If you do not have a sharps container, call your pharmacist or healthcare provider to get one. A special MedGuide will be given to you by the pharmacist with each prescription and refill. Be sure to read this information carefully each time. Talk to your pediatrician regarding the use of this medicine in children. While this medicine may be used in children as young as 1 month of age for selected conditions, precautions do  apply. Overdosage: If you think you have taken too much of this medicine contact a poison control center or emergency room at once. NOTE: This medicine is only for you. Do not share this medicine with others. What if I miss a dose? If you miss a dose, take it as soon as you can. If it is almost time for your next dose, take only that dose. Do not take double or extra doses. What may interact with this medicine? Do not take this medicine with any of the following medications:  epoetin alfa This list may not describe all possible interactions. Give your health care provider a list of all the medicines, herbs, non-prescription drugs, or dietary supplements you use. Also tell them if you smoke, drink alcohol, or use illegal drugs. Some items may interact with your medicine. What should I watch for while using this medicine? Your condition will be monitored carefully while you are receiving this medicine. You may need blood work done while you are taking this medicine. This medicine may cause a decrease in vitamin B6. You should make sure that you get enough vitamin B6 while you are taking this medicine. Discuss the foods you eat and the vitamins you take with your health care professional. What side effects may I notice from receiving this medicine? Side effects that you should report to your doctor or health care professional as soon as possible:  allergic reactions like skin rash, itching or hives, swelling of the face, lips, or tongue  breathing problems  changes in   vision  chest pain  confusion, trouble speaking or understanding  feeling faint or lightheaded, falls  high blood pressure  muscle aches or pains  pain, swelling, warmth in the leg  rapid weight gain  severe headaches  sudden numbness or weakness of the face, arm or leg  trouble walking, dizziness, loss of balance or coordination  seizures (convulsions)  swelling of the ankles, feet, hands  unusually weak or  tired Side effects that usually do not require medical attention (report to your doctor or health care professional if they continue or are bothersome):  diarrhea  fever, chills (flu-like symptoms)  headaches  nausea, vomiting  redness, stinging, or swelling at site where injected This list may not describe all possible side effects. Call your doctor for medical advice about side effects. You may report side effects to FDA at 1-800-FDA-1088. Where should I keep my medicine? Keep out of the reach of children. Store in a refrigerator between 2 and 8 degrees C (36 and 46 degrees F). Do not freeze. Do not shake. Throw away any unused portion if using a single-dose vial. Throw away any unused medicine after the expiration date. NOTE: This sheet is a summary. It may not cover all possible information. If you have questions about this medicine, talk to your doctor, pharmacist, or health care provider.  2021 Elsevier/Gold Standard (2017-09-22 16:44:20)  

## 2021-02-20 NOTE — Assessment & Plan Note (Signed)
Treatment plan:Aranesp every 3 weeks 500 mcg (increased from 300 mcg)and supportive care with blood transfusions  Lab review:  01/29/2020: Hemoglobin 7.8, MCV 103.4, platelets 149 03/28/2020: Hemoglobin is 9 (it was 6.9 on 03/22/2020) 05/23/2020: Hemoglobin 7.1(received blood transfusion) 07/05/2020: Hemoglobin 6.2 (received blood transfusion) 07/26/2020: Hemoglobin 7.3 (received blood transfusion) 09/16/2020: Hemoglobin 7.1 (received blood) 10/15/2020:Hemoglobin 6.9 (1 unit) 11/23/20: ED (1 unit PRBC) 11/26/20: I unit PRBC 12/26/2020: Hemoglobin 9.9 01/23/2021: Hemoglobin 6.7 (1 unit of PRBC)  Bone marrow biopsy: Hypercellular bone marrow for age 34 to 60% cellularity with dyspoietic changes associated with abundant ring sideroblasts. No increase in blastic cells identified. Findings favor MDS with ringed sideroblasts Cytogenetics and FISH: SF3B1 mutation detected (present in 80% of MDS with ringed sideroblasts) no other mutations  Treatment plan: Aranespstarted 5/21/2021and blood transfusions and supportive care  Elevated ferritin: Iron overload from frequent blood transfusions:onExjade. 1000 mg a day Hospitalization: 12/12/2020-12/17/2020: CT showing large necrotic left lower lobe lung mass, severe sepsis Bronchoscopy, EBUS 12/16/2020: Severe inflammation, no malignancy   Today's hemoglobin is

## 2021-02-22 ENCOUNTER — Other Ambulatory Visit: Payer: Self-pay

## 2021-02-22 ENCOUNTER — Inpatient Hospital Stay: Payer: Medicare Other

## 2021-02-22 DIAGNOSIS — D469 Myelodysplastic syndrome, unspecified: Secondary | ICD-10-CM | POA: Diagnosis not present

## 2021-02-22 MED ORDER — ACETAMINOPHEN 325 MG PO TABS
650.0000 mg | ORAL_TABLET | Freq: Once | ORAL | Status: AC
  Administered 2021-02-22: 650 mg via ORAL

## 2021-02-22 MED ORDER — SODIUM CHLORIDE 0.9% IV SOLUTION
250.0000 mL | Freq: Once | INTRAVENOUS | Status: AC
  Administered 2021-02-22: 250 mL via INTRAVENOUS
  Filled 2021-02-22: qty 250

## 2021-02-22 MED ORDER — DIPHENHYDRAMINE HCL 25 MG PO CAPS
25.0000 mg | ORAL_CAPSULE | Freq: Once | ORAL | Status: AC
  Administered 2021-02-22: 25 mg via ORAL

## 2021-02-22 NOTE — Patient Instructions (Signed)

## 2021-02-24 ENCOUNTER — Other Ambulatory Visit (HOSPITAL_COMMUNITY): Payer: Self-pay

## 2021-02-24 LAB — TYPE AND SCREEN
ABO/RH(D): B POS
Antibody Screen: POSITIVE
DAT, IgG: POSITIVE
Donor AG Type: NEGATIVE
Unit division: 0

## 2021-02-24 LAB — BPAM RBC
Blood Product Expiration Date: 202206292359
ISSUE DATE / TIME: 202206041052
Unit Type and Rh: 5100

## 2021-03-05 NOTE — Progress Notes (Signed)
Patient Care Team: Joycelyn Man, NP as PCP - General (Nurse Practitioner) Nahser, Deloris Ping, MD as PCP - Cardiology (Cardiology)  DIAGNOSIS:    ICD-10-CM   1. Myelodysplastic syndrome (HCC)  D46.9 CBC with Differential ([facility] Only)    Ferritin    Iron and TIBC    Sample to Blood Bank      CHIEF COMPLIANT: Follow-up of myelodysplastic syndrome  INTERVAL HISTORY: Ricky [name] is a 87 y.o. with above-mentioned history of  myelodysplastic syndrome who last received a blood transfusions of 1 unit of PRBCs on 02/22/2021 and also receiving Aranesp (last 02/20/21). He presents to the clinic today for follow-up.  He continues to be severely fatigued as usual.  He does not have any new or worsening symptoms.  His hemoglobin today is 5.4.  ALLERGIES:  has No Known Allergies.  MEDICATIONS:  Current Outpatient Medications  Medication Sig Dispense Refill   apixaban (ELIQUIS) 5 MG TABS tablet Take 1 tablet (5 mg total) by mouth 2 (two) times daily. 180 tablet 1   cholecalciferol (VITAMIN D3) 25 MCG (1000 UNIT) tablet Take 1,000 Units by mouth daily.     cyanocobalamin 1000 MCG tablet Take 1,000 mcg by mouth daily.     Magnesium 250 MG TABS Take 1 tablet (250 mg total) by mouth daily.  0   simvastatin (ZOCOR) 20 MG tablet Take 20 mg by mouth daily.     traMADol (ULTRAM) 50 MG tablet Take 1 tablet (50 mg total) by mouth every 12 (twelve) hours as needed. 10 tablet 0   No current facility-administered medications for this visit.    PHYSICAL EXAMINATION: ECOG PERFORMANCE STATUS: 3 - Symptomatic, >50% confined to bed  Vitals:   03/06/21 1038  BP: (!) 105/50  Pulse: 84  Resp: 19  Temp: 97.7 F (36.5 C)  SpO2: 98%   Filed Weights   03/06/21 1038  Weight: 185 lb 1.6 oz (84 kg)     LABORATORY DATA:  I have reviewed the data as listed CMP Latest Ref Rng & Units 12/26/2020 12/13/2020 12/12/2020  Glucose 70 - 99 mg/dL 048(E) 986(R) 168(U)  BUN 8 - 23 mg/dL 10(C) 23  24(D)  Creatinine 0.61 - 1.24 mg/dL 3.19 2.43 8.36  Sodium 135 - 145 mmol/L 135 130(L) 133(L)  Potassium 3.5 - 5.1 mmol/L 4.2 4.1 4.0  Chloride 98 - 111 mmol/L 92(L) 97(L) 95(L)  CO2 22 - 32 mmol/L 31 25 -  Calcium 8.9 - 10.3 mg/dL 5.4(U) 7.9(L) -  Total Protein 6.5 - 8.1 g/dL 9.0(H) 7.1 -  Total Bilirubin 0.3 - 1.2 mg/dL 0.9 7.1(H) -  Alkaline Phos 38 - 126 U/L 200(H) 85 -  AST 15 - 41 U/L 22 26 -  ALT 0 - 44 U/L 57(H) 47(H) -    Lab Results  Component Value Date   WBC 3.2 (L) 03/06/2021   HGB 5.4 (LL) 03/06/2021   HCT 16.7 (L) 03/06/2021   MCV 88.8 03/06/2021   PLT 114 (L) 03/06/2021   NEUTROABS 1.4 (L) 03/06/2021    ASSESSMENT & PLAN:  Myelodysplastic syndrome (HCC) Treatment plan:  Aranesp every 3 weeks 500 mcg (increased from 300 mcg) and supportive care with blood transfusions   Lab review: 01/29/2020: Hemoglobin 7.8, MCV 103.4, platelets 149 03/28/2020: Hemoglobin is 9 (it was 6.9 on 03/22/2020) 05/23/2020: Hemoglobin 7.1 (received blood transfusion)  07/05/2020: Hemoglobin 6.2 (received blood transfusion) 07/26/2020: Hemoglobin 7.3 (received blood transfusion) 09/16/2020: Hemoglobin 7.1 (received blood)  10/15/2020: Hemoglobin 6.9 (1  unit) 11/23/20: ED (1 unit PRBC) 11/26/20: I unit PRBC 12/26/2020: Hemoglobin 9.9 01/23/2021: Hemoglobin 6.7 (1 unit of PRBC) 03/06/2021: Hemoglobin 5.4 (2 units PRBC)   Bone marrow biopsy: Hypercellular bone marrow for age 75 to 60% cellularity with dyspoietic changes associated with abundant ring sideroblasts.  No increase in blastic cells identified.  Findings favor MDS with ringed sideroblasts Cytogenetics and FISH: SF3B1 mutation detected (present in 80% of MDS with ringed sideroblasts) no other mutations   Treatment plan: Aranesp started 02/09/2020 and blood transfusions and supportive care, Aranesp has been discontinued because of lack of benefit   Elevated ferritin: Iron overload from frequent blood transfusions: on Exjade. 1000 mg a day   Hospitalization: 12/12/2020-12/17/2020: CT showing large necrotic left lower lobe lung mass, severe sepsis Bronchoscopy, EBUS 12/16/2020: Severe inflammation, no malignancy   Today's hemoglobin is 5.4 (2 units of PRBC will be given We will discontinue Aranesp because of lack of benefit.    Orders Placed This Encounter  Procedures   CBC with Differential ([facility] Only)    Standing Status:   Standing    Number of Occurrences:   20    Standing Expiration Date:   03/06/2022   Ferritin    Standing Status:   Standing    Number of Occurrences:   20    Standing Expiration Date:   03/06/2022   Iron and TIBC    Standing Status:   Standing    Number of Occurrences:   20    Standing Expiration Date:   03/06/2022   Sample to Blood Bank    Standing Status:   Standing    Number of Occurrences:   20    Standing Expiration Date:   03/06/2022   The patient has a good understanding of the overall plan. he agrees with it. he will call with any problems that may develop before the next visit here.  Total time spent: 20 mins including face to face time and time spent for planning, charting and coordination of care  Rulon Eisenmenger, MD, MPH 03/06/2021  I, Thana Ates, am acting as scribe for Dr. Nicholas Lose.  I have reviewed the above documentation for accuracy and completeness, and I agree with the above.

## 2021-03-05 NOTE — Assessment & Plan Note (Signed)
Treatment plan:Aranesp every 3 weeks 500 mcg (increased from 300 mcg)and supportive care with blood transfusions  Lab review:  01/29/2020: Hemoglobin 7.8, MCV 103.4, platelets 149 03/28/2020: Hemoglobin is 9 (it was 6.9 on 03/22/2020) 05/23/2020: Hemoglobin 7.1(received blood transfusion) 07/05/2020: Hemoglobin 6.2 (received blood transfusion) 07/26/2020: Hemoglobin 7.3 (received blood transfusion) 09/16/2020: Hemoglobin 7.1 (received blood) 10/15/2020:Hemoglobin 6.9 (1 unit) 11/23/20: ED (1 unit PRBC) 11/26/20: I unit PRBC 12/26/2020: Hemoglobin 9.9 01/23/2021: Hemoglobin 6.7 (1 unit of PRBC)  Bone marrow biopsy: Hypercellular bone marrow for age 40 to 60% cellularity with dyspoietic changes associated with abundant ring sideroblasts. No increase in blastic cells identified. Findings favor MDS with ringed sideroblasts Cytogenetics and FISH: SF3B1 mutation detected (present in 80% of MDS with ringed sideroblasts) no other mutations  Treatment plan: Aranespstarted 5/21/2021and blood transfusions and supportive care  Elevated ferritin: Iron overload from frequent blood transfusions:onExjade. 1000 mg a day Hospitalization: 12/12/2020-12/17/2020: CT showing large necrotic left lower lobe lung mass, severe sepsis Bronchoscopy, EBUS 12/16/2020: Severe inflammation, no malignancy   Today's hemoglobin is  

## 2021-03-06 ENCOUNTER — Other Ambulatory Visit: Payer: Self-pay

## 2021-03-06 ENCOUNTER — Inpatient Hospital Stay: Payer: Medicare Other

## 2021-03-06 ENCOUNTER — Inpatient Hospital Stay (HOSPITAL_BASED_OUTPATIENT_CLINIC_OR_DEPARTMENT_OTHER): Payer: Medicare Other | Admitting: Hematology and Oncology

## 2021-03-06 DIAGNOSIS — D469 Myelodysplastic syndrome, unspecified: Secondary | ICD-10-CM | POA: Diagnosis not present

## 2021-03-06 DIAGNOSIS — D638 Anemia in other chronic diseases classified elsewhere: Secondary | ICD-10-CM

## 2021-03-06 DIAGNOSIS — D4620 Refractory anemia with excess of blasts, unspecified: Secondary | ICD-10-CM

## 2021-03-06 LAB — CBC WITH DIFFERENTIAL (CANCER CENTER ONLY)
Abs Immature Granulocytes: 0.02 10*3/uL (ref 0.00–0.07)
Basophils Absolute: 0 10*3/uL (ref 0.0–0.1)
Basophils Relative: 0 %
Eosinophils Absolute: 0.1 10*3/uL (ref 0.0–0.5)
Eosinophils Relative: 3 %
HCT: 16.7 % — ABNORMAL LOW (ref 39.0–52.0)
Hemoglobin: 5.4 g/dL — CL (ref 13.0–17.0)
Immature Granulocytes: 1 %
Lymphocytes Relative: 37 %
Lymphs Abs: 1.2 10*3/uL (ref 0.7–4.0)
MCH: 28.7 pg (ref 26.0–34.0)
MCHC: 32.3 g/dL (ref 30.0–36.0)
MCV: 88.8 fL (ref 80.0–100.0)
Monocytes Absolute: 0.5 10*3/uL (ref 0.1–1.0)
Monocytes Relative: 15 %
Neutro Abs: 1.4 10*3/uL — ABNORMAL LOW (ref 1.7–7.7)
Neutrophils Relative %: 44 %
Platelet Count: 114 10*3/uL — ABNORMAL LOW (ref 150–400)
RBC: 1.88 MIL/uL — ABNORMAL LOW (ref 4.22–5.81)
RDW: 17.2 % — ABNORMAL HIGH (ref 11.5–15.5)
WBC Count: 3.2 10*3/uL — ABNORMAL LOW (ref 4.0–10.5)
nRBC: 0 % (ref 0.0–0.2)

## 2021-03-06 LAB — SAMPLE TO BLOOD BANK

## 2021-03-06 LAB — PREPARE RBC (CROSSMATCH)

## 2021-03-06 NOTE — Progress Notes (Signed)
CRITICAL VALUE STICKER  CRITICAL VALUE: Hgb 5.4  MD NOTIFIED: Dr. Lindi Adie   RESPONSE:  Patient will receive 2 units of PRBC's on Saturday, 6/18.  Pt aware, orders placed.

## 2021-03-08 ENCOUNTER — Other Ambulatory Visit: Payer: Self-pay

## 2021-03-08 ENCOUNTER — Inpatient Hospital Stay: Payer: Medicare Other

## 2021-03-08 DIAGNOSIS — D469 Myelodysplastic syndrome, unspecified: Secondary | ICD-10-CM | POA: Diagnosis not present

## 2021-03-08 DIAGNOSIS — D638 Anemia in other chronic diseases classified elsewhere: Secondary | ICD-10-CM

## 2021-03-08 MED ORDER — DIPHENHYDRAMINE HCL 25 MG PO CAPS
25.0000 mg | ORAL_CAPSULE | Freq: Once | ORAL | Status: AC
  Administered 2021-03-08: 25 mg via ORAL

## 2021-03-08 MED ORDER — SODIUM CHLORIDE 0.9% IV SOLUTION
250.0000 mL | Freq: Once | INTRAVENOUS | Status: AC
  Administered 2021-03-08: 250 mL via INTRAVENOUS
  Filled 2021-03-08: qty 250

## 2021-03-08 MED ORDER — ACETAMINOPHEN 325 MG PO TABS
ORAL_TABLET | ORAL | Status: AC
  Filled 2021-03-08: qty 2

## 2021-03-08 MED ORDER — DIPHENHYDRAMINE HCL 25 MG PO CAPS
ORAL_CAPSULE | ORAL | Status: AC
  Filled 2021-03-08: qty 1

## 2021-03-08 MED ORDER — ACETAMINOPHEN 325 MG PO TABS
650.0000 mg | ORAL_TABLET | Freq: Once | ORAL | Status: AC
  Administered 2021-03-08: 650 mg via ORAL

## 2021-03-08 NOTE — Patient Instructions (Signed)
https://www.[facility].org/donate-blood/blood-donation-process/what-happens-to-donated-blood/blood-transfusions/types-of-[email or web address].html"> https://www.[facility].org/donate-blood/blood-donation-process/what-happens-to-donated-blood/blood-transfusions/risks-complications.html">  Blood Transfusion, Adult, Care After This sheet gives you information about how to care for yourself after your procedure. Your health care provider may also give you more specific instructions. If you have problems or questions, contact your health careprovider. What can I expect after the procedure? After the procedure, it is common to have: Bruising and soreness where the IV was inserted. A fever or chills on the day of the procedure. This may be your body's response to the new blood cells received. A headache. Follow these instructions at home: IV insertion site care     Follow instructions from your health care provider about how to take care of your IV insertion site. Make sure you: Wash your hands with soap and water before and after you change your bandage (dressing). If soap and water are not available, use hand sanitizer. Change your dressing as told by your health care provider. Check your IV insertion site every day for signs of infection. Check for: Redness, swelling, or pain. Bleeding from the site. Warmth. Pus or a bad smell. General instructions Take over-the-counter and prescription medicines only as told by your health care provider. Rest as told by your health care provider. Return to your normal activities as told by your health care provider. Keep all follow-up visits as told by your health care provider. This is important. Contact a health care provider if: You have itching or red, swollen areas of skin (hives). You feel anxious. You feel weak after doing your normal activities. You have redness, swelling, warmth, or pain around the IV insertion site. You have blood coming  from the IV insertion site that does not stop with pressure. You have pus or a bad smell coming from your IV insertion site. Get help right away if: You have symptoms of a serious allergic or immune system reaction, including: Trouble breathing or shortness of breath. Swelling of the face or feeling flushed. Fever or chills. Pain in the head, back, or chest. Dark urine or blood in the urine. Widespread rash. Fast heartbeat. Feeling dizzy or light-headed. If you receive your blood transfusion in an outpatient setting, you will betold whom to contact to report any reactions. These symptoms may represent a serious problem that is an emergency. Do not wait to see if the symptoms will go away. Get medical help right away. Call your local emergency services (911 in the [place] Do not drive yourself to the hospital. Summary Bruising and tenderness around the IV insertion site are common. Check your IV insertion site every day for signs of infection. Rest as told by your health care provider. Return to your normal activities as told by your health care provider. Get help right away for symptoms of a serious allergic or immune system reaction to blood transfusion. This information is not intended to replace advice given to you by your health care provider. Make sure you discuss any questions you have with your healthcare provider. Document Revised: 03/02/2019 Document Reviewed: 03/02/2019 Elsevier Patient Education  2022 Elsevier Inc.  

## 2021-03-09 LAB — TYPE AND SCREEN
ABO/RH(D): B POS
Antibody Screen: POSITIVE
DAT, IgG: NEGATIVE
Donor AG Type: NEGATIVE
Donor AG Type: NEGATIVE
Unit division: 0
Unit division: 0

## 2021-03-09 LAB — BPAM RBC
Blood Product Expiration Date: 202207182359
Blood Product Expiration Date: 202207212359
ISSUE DATE / TIME: 202206180904
ISSUE DATE / TIME: 202206180904
Unit Type and Rh: 7300
Unit Type and Rh: 7300

## 2021-03-20 ENCOUNTER — Other Ambulatory Visit (HOSPITAL_COMMUNITY): Payer: Self-pay

## 2021-03-20 ENCOUNTER — Other Ambulatory Visit: Payer: Self-pay | Admitting: *Deleted

## 2021-03-20 ENCOUNTER — Inpatient Hospital Stay: Payer: Medicare Other

## 2021-03-20 ENCOUNTER — Other Ambulatory Visit: Payer: Self-pay

## 2021-03-20 DIAGNOSIS — D638 Anemia in other chronic diseases classified elsewhere: Secondary | ICD-10-CM

## 2021-03-20 DIAGNOSIS — D469 Myelodysplastic syndrome, unspecified: Secondary | ICD-10-CM | POA: Diagnosis not present

## 2021-03-20 LAB — CBC WITH DIFFERENTIAL (CANCER CENTER ONLY)
Abs Immature Granulocytes: 0 10*3/uL (ref 0.00–0.07)
Band Neutrophils: 1 %
Basophils Absolute: 0.1 10*3/uL (ref 0.0–0.1)
Basophils Relative: 2 %
Eosinophils Absolute: 0.1 10*3/uL (ref 0.0–0.5)
Eosinophils Relative: 4 %
HCT: 19.2 % — ABNORMAL LOW (ref 39.0–52.0)
Hemoglobin: 6.4 g/dL — CL (ref 13.0–17.0)
Lymphocytes Relative: 39 %
Lymphs Abs: 1.3 10*3/uL (ref 0.7–4.0)
MCH: 28.7 pg (ref 26.0–34.0)
MCHC: 33.3 g/dL (ref 30.0–36.0)
MCV: 86.1 fL (ref 80.0–100.0)
Monocytes Absolute: 0.7 10*3/uL (ref 0.1–1.0)
Monocytes Relative: 21 %
Neutro Abs: 1.2 10*3/uL — ABNORMAL LOW (ref 1.7–7.7)
Neutrophils Relative %: 33 %
Platelet Count: 128 10*3/uL — ABNORMAL LOW (ref 150–400)
RBC: 2.23 MIL/uL — ABNORMAL LOW (ref 4.22–5.81)
RDW: 17.1 % — ABNORMAL HIGH (ref 11.5–15.5)
WBC Count: 3.4 10*3/uL — ABNORMAL LOW (ref 4.0–10.5)
nRBC: 0 % (ref 0.0–0.2)

## 2021-03-20 LAB — IRON AND TIBC
Iron: 189 ug/dL — ABNORMAL HIGH (ref 42–163)
Saturation Ratios: 102 % — ABNORMAL HIGH (ref 20–55)
TIBC: 185 ug/dL — ABNORMAL LOW (ref 202–409)
UIBC: UNDETERMINED ug/dL (ref 117–376)

## 2021-03-20 LAB — SAMPLE TO BLOOD BANK

## 2021-03-20 LAB — FERRITIN: Ferritin: 3883 ng/mL — ABNORMAL HIGH (ref 24–336)

## 2021-03-20 NOTE — Progress Notes (Signed)
CRITICAL VALUE STICKER  CRITICAL VALUE: Hgb 6.4  Geraldo Docker, RN  DATE & TIME NOTIFIED: 03/20/21 at 63  MD NOTIFIED:  Nicholas Lose, MD  [place]: 03/20/21 at 1135  RESPONSE: MD notified, orders received for pt to receive 2 units of PRBC's 03/22/21.  Orders placed.

## 2021-03-21 LAB — PREPARE RBC (CROSSMATCH)

## 2021-03-22 ENCOUNTER — Inpatient Hospital Stay: Payer: Medicare Other | Attending: Hematology and Oncology

## 2021-03-22 ENCOUNTER — Other Ambulatory Visit: Payer: Self-pay

## 2021-03-22 DIAGNOSIS — D4620 Refractory anemia with excess of blasts, unspecified: Secondary | ICD-10-CM | POA: Insufficient documentation

## 2021-03-22 DIAGNOSIS — D638 Anemia in other chronic diseases classified elsewhere: Secondary | ICD-10-CM

## 2021-03-22 MED ORDER — SODIUM CHLORIDE 0.9% IV SOLUTION
250.0000 mL | Freq: Once | INTRAVENOUS | Status: AC
  Administered 2021-03-22: 250 mL via INTRAVENOUS
  Filled 2021-03-22: qty 250

## 2021-03-22 MED ORDER — DIPHENHYDRAMINE HCL 25 MG PO CAPS
25.0000 mg | ORAL_CAPSULE | Freq: Once | ORAL | Status: AC
  Administered 2021-03-22: 25 mg via ORAL

## 2021-03-22 MED ORDER — ACETAMINOPHEN 325 MG PO TABS
650.0000 mg | ORAL_TABLET | Freq: Once | ORAL | Status: AC
  Administered 2021-03-22: 650 mg via ORAL

## 2021-03-22 NOTE — Patient Instructions (Signed)
https://www.[facility].org/donate-blood/blood-donation-process/what-happens-to-donated-blood/blood-transfusions/types-of-[email or web address].html"> https://www.[facility].org/donate-blood/blood-donation-process/what-happens-to-donated-blood/blood-transfusions/risks-complications.html">  Blood Transfusion, Adult, Care After This sheet gives you information about how to care for yourself after your procedure. Your health care provider may also give you more specific instructions. If you have problems or questions, contact your health careprovider. What can I expect after the procedure? After the procedure, it is common to have: Bruising and soreness where the IV was inserted. A fever or chills on the day of the procedure. This may be your body's response to the new blood cells received. A headache. Follow these instructions at home: IV insertion site care     Follow instructions from your health care provider about how to take care of your IV insertion site. Make sure you: Wash your hands with soap and water before and after you change your bandage (dressing). If soap and water are not available, use hand sanitizer. Change your dressing as told by your health care provider. Check your IV insertion site every day for signs of infection. Check for: Redness, swelling, or pain. Bleeding from the site. Warmth. Pus or a bad smell. General instructions Take over-the-counter and prescription medicines only as told by your health care provider. Rest as told by your health care provider. Return to your normal activities as told by your health care provider. Keep all follow-up visits as told by your health care provider. This is important. Contact a health care provider if: You have itching or red, swollen areas of skin (hives). You feel anxious. You feel weak after doing your normal activities. You have redness, swelling, warmth, or pain around the IV insertion site. You have blood coming  from the IV insertion site that does not stop with pressure. You have pus or a bad smell coming from your IV insertion site. Get help right away if: You have symptoms of a serious allergic or immune system reaction, including: Trouble breathing or shortness of breath. Swelling of the face or feeling flushed. Fever or chills. Pain in the head, back, or chest. Dark urine or blood in the urine. Widespread rash. Fast heartbeat. Feeling dizzy or light-headed. If you receive your blood transfusion in an outpatient setting, you will betold whom to contact to report any reactions. These symptoms may represent a serious problem that is an emergency. Do not wait to see if the symptoms will go away. Get medical help right away. Call your local emergency services (911 in the [place] Do not drive yourself to the hospital. Summary Bruising and tenderness around the IV insertion site are common. Check your IV insertion site every day for signs of infection. Rest as told by your health care provider. Return to your normal activities as told by your health care provider. Get help right away for symptoms of a serious allergic or immune system reaction to blood transfusion. This information is not intended to replace advice given to you by your health care provider. Make sure you discuss any questions you have with your healthcare provider. Document Revised: 03/02/2019 Document Reviewed: 03/02/2019 Elsevier Patient Education  2022 Elsevier Inc.  

## 2021-03-23 LAB — TYPE AND SCREEN
ABO/RH(D): B POS
Antibody Screen: POSITIVE
DAT, IgG: NEGATIVE
Donor AG Type: NEGATIVE
Donor AG Type: NEGATIVE
Unit division: 0
Unit division: 0

## 2021-03-23 LAB — BPAM RBC
Blood Product Expiration Date: 202208082359
Blood Product Expiration Date: 202208082359
ISSUE DATE / TIME: 202207021013
ISSUE DATE / TIME: 202207021200
Unit Type and Rh: 5100
Unit Type and Rh: 5100

## 2021-03-28 ENCOUNTER — Other Ambulatory Visit: Payer: Medicare Other | Admitting: Hospice

## 2021-03-28 ENCOUNTER — Other Ambulatory Visit: Payer: Self-pay

## 2021-03-28 DIAGNOSIS — D4620 Refractory anemia with excess of blasts, unspecified: Secondary | ICD-10-CM

## 2021-03-28 DIAGNOSIS — I482 Chronic atrial fibrillation, unspecified: Secondary | ICD-10-CM

## 2021-03-28 DIAGNOSIS — Z515 Encounter for palliative care: Secondary | ICD-10-CM

## 2021-03-28 DIAGNOSIS — D638 Anemia in other chronic diseases classified elsewhere: Secondary | ICD-10-CM

## 2021-03-28 NOTE — Progress Notes (Signed)
**Note Ricky-Identified via Obfuscation** [facility] Consult Note Telephone: [phone]  Fax: [phone]  PATIENT NAME: Ricky [name] DOB: [birth year] MRN: [id number]  PRIMARY CARE PROVIDER:   Roetta Sessions, NP Ricky Sessions, NP [address] [place],  [place] [address]  REFERRING PROVIDER: Roetta Sessions, NP Ricky Sessions, NP [address] [place],  [place] [address]  RESPONSIBLE PARTY:  Ricky [name] [phone]  Extended Emergency Contact Information Primary Emergency Contact: Ricky [name] Home Phone: [phone] Mobile Phone: [phone] Relation: Son  Patient: [phone] Spouse: Ricky [name] Information     Name Relation Home Work Ricky [name] Son [phone]  [phone]   Ricky [name] Spouse [phone]  [phone]   Ricky [name] Other   [phone]       Visit is to build trust and highlight Palliative Medicine as specialized medical care for people living with serious illness, aimed at facilitating better quality of life through symptoms relief, assisting with advance care planning and complex medical decision making. NP called son Ricky [name] and updated him on visit. He expressed appreciation for the visit/update. This is a follow up visit.  RECOMMENDATIONS/PLAN:   Advance Care Planning/Code Status: Patient is a Do Not Resuscitate  Goals of Care: Goals of care include to maximize quality of life and symptom management.  Visit consisted of counseling and education dealing with the complex and emotionally intense issues of symptom management and palliative care in the setting of serious and potentially life-threatening illness. Palliative care team will continue to support patient, patient's family, and medical team.  Symptom management/Plan:  Myelodysplastic syndrome, with associated anemia: Continue monthly treatement at the [facility] - PRBCs  and Aranesp  Fatigue: Likely related to anemia. Physical therapy is ongoing for strengthening and gait training/self care. Fall precautions discussed.  Afib: Continue Eliquis. Follow up: Palliative care will continue to follow for complex medical decision making, advance care planning, and clarification of goals. Return 6 weeks or prn. Encouraged to call provider sooner with any concerns.   CHIEF COMPLAINT: Palliative follow up visit/weakness   HISTORY OF PRESENT ILLNESS:  Ricky [name] a 86 y.o. male with multiple medical problems including  low-grade myelodysplastic syndrome, anemia of chronic disease, chronic fatigue, paroxysmal A. Fib, CAD, Chronic diastolic CHF.  Patient denied pain/discomfort; endorsed chronic fatigue and reported physical therapy and daily exercises are helpful. History obtained from review of EMR, discussion with primary team, family and/or patient. Records reviewed and summarized above. All 10 point systems reviewed and are negative except as documented in history of present illness above  Review and summarization of Epic records shows history from other than patient.   Palliative Care was asked to follow this patient o help address complex decision making in the context of advance care planning and goals of care clarification.   PHYSICAL EXAM  BP 110/56 P 80 02 97% R 18 General: In no acute distress, appropriately dressed Cardiovascular: regular rate and rhythm; no edema in BLE Pulmonary: no cough, no increased work of breathing, normal respiratory effort Abdomen: soft, non tender, no guarding, positive bowel sounds in all quadrants GU:  no suprapubic tenderness Eyes: Normal lids, no discharge ENMT: Moist mucous membranes Musculoskeletal:  weakness, ambulatory with rolling walker Skin: no rash to visible skin, warm without cyanosis,  Psych: non-anxious affect Neurological: Weakness but otherwise non focal Heme/lymph/immuno: no bruises, no  bleeding  PERTINENT MEDICATIONS:  Outpatient Encounter Medications as  of 03/28/2021  Medication Sig   apixaban (ELIQUIS) 5 MG TABS tablet Take 1 tablet (5 mg total) by mouth 2 (two) times daily.   cholecalciferol (VITAMIN D3) 25 MCG (1000 UNIT) tablet Take 1,000 Units by mouth daily.   cyanocobalamin 1000 MCG tablet Take 1,000 mcg by mouth daily.   Magnesium 250 MG TABS Take 1 tablet (250 mg total) by mouth daily.   simvastatin (ZOCOR) 20 MG tablet Take 20 mg by mouth daily.   traMADol (ULTRAM) 50 MG tablet Take 1 tablet (50 mg total) by mouth every 12 (twelve) hours as needed.   No facility-administered encounter medications on file as of 03/28/2021.    HOSPICE ELIGIBILITY/DIAGNOSIS: TBD  PAST MEDICAL HISTORY:  Past Medical History:  Diagnosis Date   A-fib ([facility])    Anemia    Anemia of chronic disease 02/02/2020   Atrial fibrillation (HCC)    Boerhaave's syndrome    Chronic anticoagulation 01/22/2020   Colon polyps    COVID-19    Hx of colonic polyps 01/22/2020   2017 - [place]   Hypomagnesemia    Low grade myelodysplastic syndrome lesions ([facility]) 02/02/2020   Lower urinary tract symptoms due to benign prostatic hyperplasia    Pacemaker    Paroxysmal atrial fibrillation (HCC)    Prostate enlargement    Symptomatic anemia 12/26/2019   Vitamin D deficiency      Review lab tests/diagnostics Results for Ricky [name] "Ricky [name]" (MRN [id number]) as of 03/28/2021 10:58  Ref. Range 03/20/2021 11:10  WBC Latest Ref Range: 4.0 - 10.5 K/uL 3.4 (L)  RBC Latest Ref Range: 4.22 - 5.81 MIL/uL 2.23 (L)  Hemoglobin Latest Ref Range: 13.0 - 17.0 g/dL 6.4 (LL)  HCT Latest Ref Range: 39.0 - 52.0 % 19.2 (L)  MCV Latest Ref Range: 80.0 - 100.0 fL 86.1  MCH Latest Ref Range: 26.0 - 34.0 pg 28.7  MCHC Latest Ref Range: 30.0 - 36.0 g/dL 33.3  RDW Latest Ref Range: 11.5 - 15.5 % 17.1 (H)  Platelets Latest Ref Range: 150 - 400 K/uL 128 (L)  nRBC Latest Ref Range: 0.0 - 0.2 % 0.0    ALLERGIES: No Known  Allergies    I spent 60 minutes providing this consultation; this includes time spent with patient/family, chart review and documentation. More than 50% of the time in this consultation was spent on counseling and coordinating communication   Thank you for the opportunity to participate in the care of Ricky [name] Please call our office at [phone] if we can be of additional assistance.  Note: Portions of this note were generated with [employer]. Dictation errors may occur despite best attempts at proofreading.  Teodoro Spray, NP

## 2021-04-02 NOTE — Progress Notes (Signed)
Patient Care Team: Joycelyn Man, NP as PCP - General (Nurse Practitioner) Nahser, Deloris Ping, MD as PCP - Cardiology (Cardiology)  DIAGNOSIS:    ICD-10-CM   1. Myelodysplastic syndrome (HCC)  D46.9       CHIEF COMPLIANT: Follow-up of myelodysplastic syndrome  INTERVAL HISTORY: Ricky [name] is a 87 y.o. with above-mentioned history of myelodysplastic syndrome who last received a blood transfusions of 1 unit of PRBCs on 02/22/2021 and also receiving Aranesp (last 02/20/21). He presents to the clinic today for follow-up.  He continues to remain severely fatigued and is using a wheelchair for ambulation.  ALLERGIES:  has No Known Allergies.  MEDICATIONS:  Current Outpatient Medications  Medication Sig Dispense Refill   apixaban (ELIQUIS) 5 MG TABS tablet Take 1 tablet (5 mg total) by mouth 2 (two) times daily. 180 tablet 1   cholecalciferol (VITAMIN D3) 25 MCG (1000 UNIT) tablet Take 1,000 Units by mouth daily.     cyanocobalamin 1000 MCG tablet Take 1,000 mcg by mouth daily.     Magnesium 250 MG TABS Take 1 tablet (250 mg total) by mouth daily.  0   simvastatin (ZOCOR) 20 MG tablet Take 20 mg by mouth daily.     traMADol (ULTRAM) 50 MG tablet Take 1 tablet (50 mg total) by mouth every 12 (twelve) hours as needed. 10 tablet 0   No current facility-administered medications for this visit.    PHYSICAL EXAMINATION: ECOG PERFORMANCE STATUS: 1 - Symptomatic but completely ambulatory  Vitals:   04/03/21 1151  BP: (!) 118/56  Pulse: 78  Resp: 20  Temp: 97.8 F (36.6 C)  SpO2: 98%   Filed Weights   04/03/21 1151  Weight: 184 lb 11.2 oz (83.8 kg)    LABORATORY DATA:  I have reviewed the data as listed CMP Latest Ref Rng & Units 12/26/2020 12/13/2020 12/12/2020  Glucose 70 - 99 mg/dL 519(W) 414(Z) 151(V)  BUN 8 - 23 mg/dL 28(I) 23 99(P)  Creatinine 0.61 - 1.24 mg/dL 3.30 0.56 5.43  Sodium 135 - 145 mmol/L 135 130(L) 133(L)  Potassium 3.5 - 5.1 mmol/L 4.2 4.1 4.0   Chloride 98 - 111 mmol/L 92(L) 97(L) 95(L)  CO2 22 - 32 mmol/L 31 25 -  Calcium 8.9 - 10.3 mg/dL 3.9(E) 7.9(L) -  Total Protein 6.5 - 8.1 g/dL 9.0(H) 7.1 -  Total Bilirubin 0.3 - 1.2 mg/dL 0.9 7.9(Y) -  Alkaline Phos 38 - 126 U/L 200(H) 85 -  AST 15 - 41 U/L 22 26 -  ALT 0 - 44 U/L 57(H) 47(H) -    Lab Results  Component Value Date   WBC 4.2 04/03/2021   HGB 6.7 (LL) 04/03/2021   HCT 20.1 (L) 04/03/2021   MCV 81.4 04/03/2021   PLT 139 (L) 04/03/2021   NEUTROABS 1.8 04/03/2021    ASSESSMENT & PLAN:  Myelodysplastic syndrome (HCC) Treatment plan:  Aranesp every 3 weeks 500 mcg (increased from 300 mcg) and supportive care with blood transfusions   Lab review: 01/29/2020: Hemoglobin 7.8, MCV 103.4, platelets 149 03/28/2020: Hemoglobin is 9 (it was 6.9 on 03/22/2020) 05/23/2020: Hemoglobin 7.1 (received blood transfusion)  07/05/2020: Hemoglobin 6.2 (received blood transfusion) 07/26/2020: Hemoglobin 7.3 (received blood transfusion) 09/16/2020: Hemoglobin 7.1 (received blood)  10/15/2020: Hemoglobin 6.9 (1 unit) 11/23/20: ED (1 unit PRBC) 11/26/20: I unit PRBC 12/26/2020: Hemoglobin 9.9 01/23/2021: Hemoglobin 6.7 (1 unit of PRBC) 03/06/2021: Hemoglobin 5.4 (2 units PRBC) 04/03/2021: Hemoglobin 6.7 (1 unit PRBC)   Bone marrow biopsy: Hypercellular  bone marrow for age 58 to 60% cellularity with dyspoietic changes associated with abundant ring sideroblasts.  No increase in blastic cells identified.  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 1000 mg a day  Hospitalization: 12/12/2020-12/17/2020: CT showing large necrotic left lower lobe lung mass, severe sepsis Bronchoscopy, EBUS 12/16/2020: Severe inflammation, no malignancy   Today's hemoglobin is  6.7 blood transfusion will need to be given on Saturday. Return to clinic every 2 weeks for blood transfusion and in 4 weeks for follow-up with me.   No orders of the defined types were placed in this encounter.  The patient has a good understanding of the overall plan. he agrees with it. he will call with any problems that may develop before the next visit here.  Total time spent: 20 mins including face to face time and time spent for planning, charting and coordination of care  Rulon Eisenmenger, MD, MPH 04/03/2021  I, Thana Ates, am acting as scribe for Dr. Nicholas Lose.  I have reviewed the above documentation for accuracy and completeness, and I agree with the above.

## 2021-04-03 ENCOUNTER — Ambulatory Visit: Payer: Medicare Other | Admitting: Hematology and Oncology

## 2021-04-03 ENCOUNTER — Other Ambulatory Visit: Payer: Self-pay

## 2021-04-03 ENCOUNTER — Other Ambulatory Visit: Payer: Self-pay | Admitting: *Deleted

## 2021-04-03 ENCOUNTER — Inpatient Hospital Stay (HOSPITAL_BASED_OUTPATIENT_CLINIC_OR_DEPARTMENT_OTHER): Payer: Medicare Other | Admitting: Hematology and Oncology

## 2021-04-03 ENCOUNTER — Inpatient Hospital Stay: Payer: Medicare Other

## 2021-04-03 DIAGNOSIS — D469 Myelodysplastic syndrome, unspecified: Secondary | ICD-10-CM | POA: Diagnosis not present

## 2021-04-03 DIAGNOSIS — D4620 Refractory anemia with excess of blasts, unspecified: Secondary | ICD-10-CM | POA: Diagnosis not present

## 2021-04-03 LAB — CBC WITH DIFFERENTIAL (CANCER CENTER ONLY)
Abs Immature Granulocytes: 0.02 10*3/uL (ref 0.00–0.07)
Basophils Absolute: 0 10*3/uL (ref 0.0–0.1)
Basophils Relative: 0 %
Eosinophils Absolute: 0.1 10*3/uL (ref 0.0–0.5)
Eosinophils Relative: 2 %
HCT: 20.1 % — ABNORMAL LOW (ref 39.0–52.0)
Hemoglobin: 6.7 g/dL — CL (ref 13.0–17.0)
Immature Granulocytes: 1 %
Lymphocytes Relative: 28 %
Lymphs Abs: 1.2 10*3/uL (ref 0.7–4.0)
MCH: 27.1 pg (ref 26.0–34.0)
MCHC: 33.3 g/dL (ref 30.0–36.0)
MCV: 81.4 fL (ref 80.0–100.0)
Monocytes Absolute: 1 10*3/uL (ref 0.1–1.0)
Monocytes Relative: 25 %
Neutro Abs: 1.8 10*3/uL (ref 1.7–7.7)
Neutrophils Relative %: 44 %
Platelet Count: 139 10*3/uL — ABNORMAL LOW (ref 150–400)
RBC: 2.47 MIL/uL — ABNORMAL LOW (ref 4.22–5.81)
RDW: 18.2 % — ABNORMAL HIGH (ref 11.5–15.5)
WBC Count: 4.2 10*3/uL (ref 4.0–10.5)
nRBC: 0 % (ref 0.0–0.2)

## 2021-04-03 LAB — SAMPLE TO BLOOD BANK

## 2021-04-03 LAB — IRON AND TIBC
Iron: 187 ug/dL — ABNORMAL HIGH (ref 42–163)
Saturation Ratios: 99 % — ABNORMAL HIGH (ref 20–55)
TIBC: 189 ug/dL — ABNORMAL LOW (ref 202–409)
UIBC: 2 ug/dL — ABNORMAL LOW (ref 117–376)

## 2021-04-03 LAB — FERRITIN: Ferritin: 4268 ng/mL — ABNORMAL HIGH (ref 24–336)

## 2021-04-03 LAB — PREPARE RBC (CROSSMATCH)

## 2021-04-03 NOTE — Progress Notes (Signed)
Orders placed for 1U PRBC for 04/05/21. BB confirmed type & screen as well as prepare. Laurence Compton, ADON aware.

## 2021-04-03 NOTE — Assessment & Plan Note (Signed)
Treatment plan:Aranesp every 3 weeks 500 mcg (increased from 300 mcg)and supportive care with blood transfusions  Lab review: 01/29/2020: Hemoglobin 7.8, MCV 103.4, platelets 149 03/28/2020: Hemoglobin is 9 (it was 6.9 on 03/22/2020) 05/23/2020: Hemoglobin 7.1(received blood transfusion) 07/05/2020: Hemoglobin 6.2 (received blood transfusion) 07/26/2020: Hemoglobin 7.3 (received blood transfusion) 09/16/2020: Hemoglobin 7.1 (received blood) 10/15/2020:Hemoglobin 6.9 (1 unit) 11/23/20: ED (1 unit PRBC) 11/26/20: I unit PRBC 12/26/2020: Hemoglobin 9.9 01/23/2021: Hemoglobin 6.7 (1 unit of PRBC) 03/06/2021: Hemoglobin 5.4 (2 units PRBC)  Bone marrow biopsy: Hypercellular bone marrow for age 27 to 60% cellularity with dyspoietic changes associated with abundant ring sideroblasts. No increase in blastic cells identified. 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

## 2021-04-05 ENCOUNTER — Other Ambulatory Visit: Payer: Self-pay

## 2021-04-05 ENCOUNTER — Inpatient Hospital Stay: Payer: Medicare Other

## 2021-04-05 DIAGNOSIS — D469 Myelodysplastic syndrome, unspecified: Secondary | ICD-10-CM

## 2021-04-05 DIAGNOSIS — D4620 Refractory anemia with excess of blasts, unspecified: Secondary | ICD-10-CM | POA: Diagnosis not present

## 2021-04-05 MED ORDER — ACETAMINOPHEN 325 MG PO TABS
650.0000 mg | ORAL_TABLET | Freq: Once | ORAL | Status: AC
  Administered 2021-04-05: 650 mg via ORAL

## 2021-04-05 MED ORDER — SODIUM CHLORIDE 0.9% IV SOLUTION
250.0000 mL | Freq: Once | INTRAVENOUS | Status: AC
  Administered 2021-04-05: 250 mL via INTRAVENOUS
  Filled 2021-04-05: qty 250

## 2021-04-05 MED ORDER — DIPHENHYDRAMINE HCL 25 MG PO CAPS
ORAL_CAPSULE | ORAL | Status: AC
  Filled 2021-04-05: qty 1

## 2021-04-05 MED ORDER — ACETAMINOPHEN 325 MG PO TABS
ORAL_TABLET | ORAL | Status: AC
  Filled 2021-04-05: qty 2

## 2021-04-05 MED ORDER — DIPHENHYDRAMINE HCL 25 MG PO CAPS
25.0000 mg | ORAL_CAPSULE | Freq: Once | ORAL | Status: AC
  Administered 2021-04-05: 25 mg via ORAL

## 2021-04-05 NOTE — Patient Instructions (Signed)
Blood Transfusion, Adult, Care After This sheet gives you information about how to care for yourself after your procedure. Your doctor may also give you more specific instructions. If youhave problems or questions, contact your doctor. What can I expect after the procedure? After the procedure, it is common to have: Bruising and soreness at the IV site. A fever or chills on the day of the procedure. This may be your body's response to the new blood cells received. A headache. Follow these instructions at home: Insertion site care     Follow instructions from your doctor about how to take care of your insertion site. This is where an IV tube was put into your vein. Make sure you: Wash your hands with soap and water before and after you change your bandage (dressing). If you cannot use soap and water, use hand sanitizer. Change your bandage as told by your doctor. Check your insertion site every day for signs of infection. Check for: Redness, swelling, or pain. Bleeding from the site. Warmth. Pus or a bad smell. General instructions Take over-the-counter and prescription medicines only as told by your doctor. Rest as told by your doctor. Go back to your normal activities as told by your doctor. Keep all follow-up visits as told by your doctor. This is important. Contact a doctor if: You have itching or red, swollen areas of skin (hives). You feel worried or nervous (anxious). You feel weak after doing your normal activities. You have redness, swelling, warmth, or pain around the insertion site. You have blood coming from the insertion site, and the blood does not stop with pressure. You have pus or a bad smell coming from the insertion site. Get help right away if: You have signs of a serious reaction. This may be coming from an allergy or the body's defense system (immune system). Signs include: Trouble breathing or shortness of breath. Swelling of the face or feeling warm  (flushed). Fever or chills. Head, chest, or back pain. Dark pee (urine) or blood in the pee. Widespread rash. Fast heartbeat. Feeling dizzy or light-headed. You may receive your blood transfusion in an outpatient setting. If so, youwill be told whom to contact to report any reactions. These symptoms may be an emergency. Do not wait to see if the symptoms will go away. Get medical help right away. Call your local emergency services (911 in the [place] Do not drive yourself to the hospital. Summary Bruising and soreness at the IV site are common. Check your insertion site every day for signs of infection. Rest as told by your doctor. Go back to your normal activities as told by your doctor. Get help right away if you have signs of a serious reaction. This information is not intended to replace advice given to you by your health care provider. Make sure you discuss any questions you have with your healthcare provider. Document Revised: 03/02/2019 Document Reviewed: 03/02/2019 Elsevier Patient Education  2022 Elsevier Inc.  

## 2021-04-06 LAB — BPAM RBC
Blood Product Expiration Date: 202208232359
ISSUE DATE / TIME: 202207160957
Unit Type and Rh: 7300

## 2021-04-06 LAB — TYPE AND SCREEN
ABO/RH(D): B POS
Antibody Screen: POSITIVE
DAT, IgG: POSITIVE
Donor AG Type: NEGATIVE
Unit division: 0

## 2021-04-07 ENCOUNTER — Telehealth: Payer: Self-pay | Admitting: Hematology and Oncology

## 2021-04-07 NOTE — Telephone Encounter (Signed)
Scheduled per 7/14 los. Called and spoke with pt confirmed that he would get appt from [facility]

## 2021-04-08 ENCOUNTER — Ambulatory Visit (INDEPENDENT_AMBULATORY_CARE_PROVIDER_SITE_OTHER): Payer: Medicare Other

## 2021-04-08 DIAGNOSIS — I495 Sick sinus syndrome: Secondary | ICD-10-CM

## 2021-04-08 LAB — CUP PACEART REMOTE DEVICE CHECK
Battery Remaining Longevity: 67 mo
Battery Remaining Percentage: 62 %
Battery Voltage: 2.99 V
Brady Statistic AP VP Percent: 1 %
Brady Statistic AP VS Percent: 2.2 %
Brady Statistic AS VP Percent: 1.9 %
Brady Statistic AS VS Percent: 95 %
Brady Statistic RA Percent Paced: 1.5 %
Brady Statistic RV Percent Paced: 1.9 %
Date Time Interrogation Session: 20220718020013
Implantable Lead Implant Date: 20090807
Implantable Lead Implant Date: 20090807
Implantable Lead Location: 753859
Implantable Lead Location: 753860
Implantable Pulse Generator Implant Date: 20170719
Lead Channel Impedance Value: 310 Ohm
Lead Channel Impedance Value: 360 Ohm
Lead Channel Pacing Threshold Amplitude: 0.5 V
Lead Channel Pacing Threshold Amplitude: 1.25 V
Lead Channel Pacing Threshold Pulse Width: 0.5 ms
Lead Channel Pacing Threshold Pulse Width: 1 ms
Lead Channel Sensing Intrinsic Amplitude: 2.6 mV
Lead Channel Sensing Intrinsic Amplitude: 7.5 mV
Lead Channel Setting Pacing Amplitude: 2.25 V
Lead Channel Setting Pacing Amplitude: 2.5 V
Lead Channel Setting Pacing Pulse Width: 1 ms
Lead Channel Setting Sensing Sensitivity: 2 mV
Pulse Gen Model: 2272
Pulse Gen Serial Number: 7927766

## 2021-04-17 ENCOUNTER — Inpatient Hospital Stay: Payer: Medicare Other

## 2021-04-17 ENCOUNTER — Other Ambulatory Visit: Payer: Self-pay | Admitting: *Deleted

## 2021-04-17 ENCOUNTER — Telehealth: Payer: Self-pay | Admitting: *Deleted

## 2021-04-17 ENCOUNTER — Other Ambulatory Visit: Payer: Self-pay

## 2021-04-17 DIAGNOSIS — D649 Anemia, unspecified: Secondary | ICD-10-CM

## 2021-04-17 DIAGNOSIS — D469 Myelodysplastic syndrome, unspecified: Secondary | ICD-10-CM

## 2021-04-17 DIAGNOSIS — D4620 Refractory anemia with excess of blasts, unspecified: Secondary | ICD-10-CM | POA: Diagnosis not present

## 2021-04-17 LAB — CBC WITH DIFFERENTIAL (CANCER CENTER ONLY)
Abs Immature Granulocytes: 0.01 10*3/uL (ref 0.00–0.07)
Basophils Absolute: 0 10*3/uL (ref 0.0–0.1)
Basophils Relative: 0 %
Eosinophils Absolute: 0.1 10*3/uL (ref 0.0–0.5)
Eosinophils Relative: 4 %
HCT: 20 % — ABNORMAL LOW (ref 39.0–52.0)
Hemoglobin: 6.6 g/dL — CL (ref 13.0–17.0)
Immature Granulocytes: 0 %
Lymphocytes Relative: 38 %
Lymphs Abs: 1.3 10*3/uL (ref 0.7–4.0)
MCH: 27.6 pg (ref 26.0–34.0)
MCHC: 33 g/dL (ref 30.0–36.0)
MCV: 83.7 fL (ref 80.0–100.0)
Monocytes Absolute: 0.5 10*3/uL (ref 0.1–1.0)
Monocytes Relative: 15 %
Neutro Abs: 1.4 10*3/uL — ABNORMAL LOW (ref 1.7–7.7)
Neutrophils Relative %: 43 %
Platelet Count: 140 10*3/uL — ABNORMAL LOW (ref 150–400)
RBC: 2.39 MIL/uL — ABNORMAL LOW (ref 4.22–5.81)
RDW: 19.7 % — ABNORMAL HIGH (ref 11.5–15.5)
WBC Count: 3.4 10*3/uL — ABNORMAL LOW (ref 4.0–10.5)
nRBC: 0 % (ref 0.0–0.2)

## 2021-04-17 LAB — SAMPLE TO BLOOD BANK

## 2021-04-17 LAB — PREPARE RBC (CROSSMATCH)

## 2021-04-17 LAB — IRON AND TIBC
Iron: 176 ug/dL — ABNORMAL HIGH (ref 42–163)
Saturation Ratios: 102 % — ABNORMAL HIGH (ref 20–55)
TIBC: 173 ug/dL — ABNORMAL LOW (ref 202–409)
UIBC: UNDETERMINED ug/dL (ref 117–376)

## 2021-04-17 LAB — FERRITIN: Ferritin: 4018 ng/mL — ABNORMAL HIGH (ref 24–336)

## 2021-04-17 NOTE — Progress Notes (Signed)
1003am- received critical lab - Hgb 6.6.  reported to V. Nicola Girt, RN with readback .

## 2021-04-17 NOTE — Telephone Encounter (Signed)
Per heme of 6.6 order placed for 1U PRBCs.  Pt is already scheduled for transfusion 7/30.  This RN called pt per above, verified he has blood bracelet on and understands to not remove it.

## 2021-04-19 ENCOUNTER — Inpatient Hospital Stay: Payer: Medicare Other

## 2021-04-19 ENCOUNTER — Other Ambulatory Visit: Payer: Self-pay

## 2021-04-19 DIAGNOSIS — D4620 Refractory anemia with excess of blasts, unspecified: Secondary | ICD-10-CM | POA: Diagnosis not present

## 2021-04-19 DIAGNOSIS — D649 Anemia, unspecified: Secondary | ICD-10-CM

## 2021-04-19 MED ORDER — ACETAMINOPHEN 325 MG PO TABS
650.0000 mg | ORAL_TABLET | Freq: Once | ORAL | Status: AC
Start: 2021-04-19 — End: 2021-04-19
  Administered 2021-04-19: 650 mg via ORAL

## 2021-04-19 MED ORDER — SODIUM CHLORIDE 0.9% IV SOLUTION
250.0000 mL | Freq: Once | INTRAVENOUS | Status: AC
  Administered 2021-04-19: 250 mL via INTRAVENOUS
  Filled 2021-04-19: qty 250

## 2021-04-19 MED ORDER — ACETAMINOPHEN 325 MG PO TABS
ORAL_TABLET | ORAL | Status: AC
  Filled 2021-04-19: qty 2

## 2021-04-19 MED ORDER — DIPHENHYDRAMINE HCL 25 MG PO CAPS
25.0000 mg | ORAL_CAPSULE | Freq: Once | ORAL | Status: AC
  Administered 2021-04-19: 25 mg via ORAL

## 2021-04-19 MED ORDER — DIPHENHYDRAMINE HCL 25 MG PO CAPS
ORAL_CAPSULE | ORAL | Status: AC
  Filled 2021-04-19: qty 1

## 2021-04-21 LAB — TYPE AND SCREEN
ABO/RH(D): B POS
Antibody Screen: POSITIVE
DAT, IgG: POSITIVE
Donor AG Type: NEGATIVE
Unit division: 0

## 2021-04-21 LAB — BPAM RBC
Blood Product Expiration Date: 202209062359
ISSUE DATE / TIME: 202207301047
Unit Type and Rh: 5100

## 2021-04-22 ENCOUNTER — Telehealth: Payer: Self-pay | Admitting: Cardiovascular Disease

## 2021-04-22 NOTE — Telephone Encounter (Signed)
Spoke with pts son who reports BP's in the 110's/60's. He is having some intermittent dizziness as well. He currently is not on any medication that could affect his blood pressure. He has chronic anemia and is being followed by oncology. Pts son reports an improvement in his symptoms after transfusions.   I advised pts son to contact his oncology team as I suspect his symptoms may be caused by his anemia. I encouraged him to increase fluids modestly.   Will forward to MD to review.

## 2021-04-22 NOTE — Telephone Encounter (Signed)
Pt c/o BP issue: STAT if pt c/o blurred vision, one-sided weakness or slurred speech  1. What are your last 5 BP readings? no  2. Are you having any other symptoms (ex. Dizziness, headache, blurred vision, passed out)? Occasionally dizzy  3. What is your BP issue? States bp has been lower than what it should be for several weeks

## 2021-04-23 ENCOUNTER — Other Ambulatory Visit (HOSPITAL_COMMUNITY): Payer: Self-pay

## 2021-04-24 ENCOUNTER — Other Ambulatory Visit: Payer: Self-pay

## 2021-04-24 ENCOUNTER — Other Ambulatory Visit (HOSPITAL_COMMUNITY): Payer: Self-pay

## 2021-04-24 MED ORDER — DEFERASIROX 500 MG PO TBSO
1000.0000 mg | ORAL_TABLET | Freq: Every day | ORAL | 3 refills | Status: DC
  Filled 2021-04-24: qty 60, 30d supply, fill #0

## 2021-04-29 ENCOUNTER — Other Ambulatory Visit (HOSPITAL_COMMUNITY): Payer: Self-pay

## 2021-04-29 NOTE — Assessment & Plan Note (Signed)
Treatment plan:Aranesp every 3 weeks 500 mcg (increased from 300 mcg)and supportive care with blood transfusions  Lab review: 01/29/2020: Hemoglobin 7.8, MCV 103.4, platelets 149 03/28/2020: Hemoglobin is 9 (it was 6.9 on 03/22/2020) 05/23/2020: Hemoglobin 7.1(received blood transfusion) 07/05/2020: Hemoglobin 6.2 (received blood transfusion) 07/26/2020: Hemoglobin 7.3 (received blood transfusion) 09/16/2020: Hemoglobin 7.1 (received blood) 10/15/2020:Hemoglobin 6.9 (1 unit) 11/23/20: ED (1 unit PRBC) 11/26/20: I unit PRBC 12/26/2020: Hemoglobin 9.9 01/23/2021: Hemoglobin 6.7 (1 unit of PRBC) 03/06/2021: Hemoglobin 5.4 (2 units PRBC)  Bone marrow biopsy: Hypercellular bone marrow for age 42 to 60% cellularity with dyspoietic changes associated with abundant ring sideroblasts. No increase in blastic cells identified. 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

## 2021-04-29 NOTE — Progress Notes (Signed)
Patient Care Team: Roetta Sessions, NP as PCP - General (Nurse Practitioner) Nahser, Wonda Cheng, MD as PCP - Cardiology (Cardiology)  DIAGNOSIS:    ICD-10-CM   1. Thrombocytopenia ([place])  D69.6     2. Myelodysplastic syndrome ([place])  D46.9       CHIEF COMPLIANT:  Follow-up of myelodysplastic syndrome  INTERVAL HISTORY: Ricky [name] is a 87 y.o. with above-mentioned history of myelodysplastic syndrome who last received a blood transfusions of 1 unit of PRBCs on 02/22/2021 and also receiving Aranesp (last 02/20/21). Labs on 04/17/21 showed RBC 2.39, Hg 6.6, HCT 20.0, PLT 140, Iron sat 102%, Ferritin 4,018. He presents to the clinic today for follow-up.  He continues to have shortness of breath to minimal exertion.  He is primarily wheelchair-bound for ambulation.  ALLERGIES:  has No Known Allergies.  MEDICATIONS:  Current Outpatient Medications  Medication Sig Dispense Refill   apixaban (ELIQUIS) 5 MG TABS tablet Take 1 tablet (5 mg total) by mouth 2 (two) times daily. 180 tablet 1   cholecalciferol (VITAMIN D3) 25 MCG (1000 UNIT) tablet Take 1,000 Units by mouth daily.     cyanocobalamin 1000 MCG tablet Take 1,000 mcg by mouth daily.     deferasirox (EXJADE) 500 MG disintegrating tablet Take 2 tablets (1,000 mg total) by mouth daily before breakfast. 60 tablet 3   Magnesium 250 MG TABS Take 1 tablet (250 mg total) by mouth daily.  0   simvastatin (ZOCOR) 20 MG tablet Take 20 mg by mouth daily.     traMADol (ULTRAM) 50 MG tablet Take 1 tablet (50 mg total) by mouth every 12 (twelve) hours as needed. 10 tablet 0   No current facility-administered medications for this visit.    PHYSICAL EXAMINATION: ECOG PERFORMANCE STATUS: 1 - Symptomatic but completely ambulatory  Vitals:   04/30/21 1051  BP: (!) 94/42  Pulse: 74  Resp: 18  Temp: 97.7 F (36.5 C)  SpO2: 100%   Filed Weights    LABORATORY DATA:  I have reviewed the data as listed CMP Latest Ref Rng & Units  12/26/2020 12/13/2020 12/12/2020  Glucose 70 - 99 mg/dL 129(H) 110(H) 125(H)  BUN 8 - 23 mg/dL 36(H) 23 27(H)  Creatinine 0.61 - 1.24 mg/dL 1.16 1.12 1.20  Sodium 135 - 145 mmol/L 135 130(L) 133(L)  Potassium 3.5 - 5.1 mmol/L 4.2 4.1 4.0  Chloride 98 - 111 mmol/L 92(L) 97(L) 95(L)  CO2 22 - 32 mmol/L 31 25 -  Calcium 8.9 - 10.3 mg/dL 8.4(L) 7.9(L) -  Total Protein 6.5 - 8.1 g/dL 9.0(H) 7.1 -  Total Bilirubin 0.3 - 1.2 mg/dL 0.9 1.5(H) -  Alkaline Phos 38 - 126 U/L 200(H) 85 -  AST 15 - 41 U/L 22 26 -  ALT 0 - 44 U/L 57(H) 47(H) -    Lab Results  Component Value Date   WBC 3.8 (L) 04/30/2021   HGB 6.9 (LL) 04/30/2021   HCT 21.2 (L) 04/30/2021   MCV 84.5 04/30/2021   PLT 130 (L) 04/30/2021   NEUTROABS 1.4 (L) 04/30/2021    ASSESSMENT & PLAN:  Thrombocytopenia ([place]) Treatment plan:  Aranesp every 3 weeks 500 mcg (increased from 300 mcg) and supportive care with blood transfusions   Lab review: 01/29/2020: Hemoglobin 7.8, MCV 103.4, platelets 149 03/28/2020: Hemoglobin is 9 (it was 6.9 on 03/22/2020) 05/23/2020: Hemoglobin 7.1 (received blood transfusion)  07/05/2020: Hemoglobin 6.2 (received blood transfusion) 07/26/2020: Hemoglobin 7.3 (received blood transfusion) 03/06/2021: Hemoglobin 5.4 (2 units PRBC)  04/30/2021: Hemoglobin 6.9 (2 units PRBC   Bone marrow biopsy: Hypercellular bone marrow for age 30 to 60% cellularity with dyspoietic changes associated with abundant ring sideroblasts.  No increase in blastic cells identified.  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  I will see the patient back in year    No orders of the defined types were placed in this encounter.  The patient has a good understanding of the overall plan. he agrees with it. he will call with any problems that may develop before the next visit here.  Total time spent: 20 mins including face to face time and time spent for planning, charting and coordination of care  Rulon Eisenmenger, MD, MPH 04/30/2021  I, Thana Ates, am acting as scribe for Dr. Nicholas Lose.  I have reviewed the above documentation for accuracy and completeness, and I agree with the above.

## 2021-04-30 ENCOUNTER — Inpatient Hospital Stay (HOSPITAL_BASED_OUTPATIENT_CLINIC_OR_DEPARTMENT_OTHER): Payer: Medicare Other | Admitting: Hematology and Oncology

## 2021-04-30 ENCOUNTER — Other Ambulatory Visit: Payer: Self-pay

## 2021-04-30 ENCOUNTER — Inpatient Hospital Stay: Payer: Medicare Other | Attending: Hematology and Oncology

## 2021-04-30 DIAGNOSIS — D469 Myelodysplastic syndrome, unspecified: Secondary | ICD-10-CM

## 2021-04-30 DIAGNOSIS — D4620 Refractory anemia with excess of blasts, unspecified: Secondary | ICD-10-CM | POA: Diagnosis present

## 2021-04-30 DIAGNOSIS — D649 Anemia, unspecified: Secondary | ICD-10-CM

## 2021-04-30 DIAGNOSIS — D696 Thrombocytopenia, unspecified: Secondary | ICD-10-CM

## 2021-04-30 LAB — CBC WITH DIFFERENTIAL (CANCER CENTER ONLY)
Abs Immature Granulocytes: 0.01 10*3/uL (ref 0.00–0.07)
Basophils Absolute: 0 10*3/uL (ref 0.0–0.1)
Basophils Relative: 1 %
Eosinophils Absolute: 0.1 10*3/uL (ref 0.0–0.5)
Eosinophils Relative: 3 %
HCT: 21.2 % — ABNORMAL LOW (ref 39.0–52.0)
Hemoglobin: 6.9 g/dL — CL (ref 13.0–17.0)
Immature Granulocytes: 0 %
Lymphocytes Relative: 30 %
Lymphs Abs: 1.1 10*3/uL (ref 0.7–4.0)
MCH: 27.5 pg (ref 26.0–34.0)
MCHC: 32.5 g/dL (ref 30.0–36.0)
MCV: 84.5 fL (ref 80.0–100.0)
Monocytes Absolute: 1.1 10*3/uL — ABNORMAL HIGH (ref 0.1–1.0)
Monocytes Relative: 30 %
Neutro Abs: 1.4 10*3/uL — ABNORMAL LOW (ref 1.7–7.7)
Neutrophils Relative %: 36 %
Platelet Count: 130 10*3/uL — ABNORMAL LOW (ref 150–400)
RBC: 2.51 MIL/uL — ABNORMAL LOW (ref 4.22–5.81)
RDW: 19.2 % — ABNORMAL HIGH (ref 11.5–15.5)
WBC Count: 3.8 10*3/uL — ABNORMAL LOW (ref 4.0–10.5)
nRBC: 0 % (ref 0.0–0.2)

## 2021-04-30 LAB — FERRITIN: Ferritin: 4037 ng/mL — ABNORMAL HIGH (ref 24–336)

## 2021-04-30 LAB — IRON AND TIBC
Iron: 179 ug/dL — ABNORMAL HIGH (ref 42–163)
Saturation Ratios: 100 % — ABNORMAL HIGH (ref 20–55)
TIBC: 179 ug/dL — ABNORMAL LOW (ref 202–409)
UIBC: 0 ug/dL — ABNORMAL LOW (ref 117–376)

## 2021-04-30 LAB — PREPARE RBC (CROSSMATCH)

## 2021-04-30 LAB — SAMPLE TO BLOOD BANK

## 2021-04-30 NOTE — Progress Notes (Signed)
CRITICAL VALUE STICKER  CRITICAL VALUE: Hgb 6.9  RECEIVER (on-site recipient of call): Katheren Puller, RN   DATE & TIME NOTIFIED: 04/30/2021 1038  MESSENGER (representative from lab): Ulice Dash   MD NOTIFIED: Dr. Nicholas Lose   TIME OF NOTIFICATION: [id number]  RESPONSE:  Pt to receive 2 units of PRBC's scheduled for 8/13.  Orders placed.

## 2021-04-30 NOTE — Assessment & Plan Note (Signed)
Treatment plan:Aranesp every 3 weeks 500 mcg (increased from 300 mcg)and supportive care with blood transfusions  Lab review: 01/29/2020: Hemoglobin 7.8, MCV 103.4, platelets 149 03/28/2020: Hemoglobin is 9 (it was 6.9 on 03/22/2020) 05/23/2020: Hemoglobin 7.1(received blood transfusion) 07/05/2020: Hemoglobin 6.2 (received blood transfusion) 07/26/2020: Hemoglobin 7.3 (received blood transfusion) 09/16/2020: Hemoglobin 7.1 (received blood) 10/15/2020:Hemoglobin 6.9 (1 unit) 11/23/20: ED (1 unit PRBC) 11/26/20: I unit PRBC 12/26/2020: Hemoglobin 9.9 01/23/2021: Hemoglobin 6.7 (1 unit of PRBC) 03/06/2021: Hemoglobin 5.4 (2 units PRBC) 04/03/2021: Hemoglobin 6.7 (1 unit PRBC)  Bone marrow biopsy: Hypercellular bone marrow for age 31 to 60% cellularity with dyspoietic changes associated with abundant ring sideroblasts. No increase in blastic cells identified. 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 Return to clinic every 2 weeks for blood transfusion and in 4 weeks for follow-up with me.

## 2021-05-01 ENCOUNTER — Ambulatory Visit: Payer: Medicare Other | Admitting: Hematology and Oncology

## 2021-05-01 ENCOUNTER — Other Ambulatory Visit: Payer: Medicare Other

## 2021-05-01 NOTE — Progress Notes (Signed)
Remote pacemaker transmission.   

## 2021-05-02 ENCOUNTER — Telehealth: Payer: Self-pay | Admitting: Hematology and Oncology

## 2021-05-02 ENCOUNTER — Other Ambulatory Visit (HOSPITAL_COMMUNITY): Payer: Self-pay

## 2021-05-02 NOTE — Telephone Encounter (Signed)
Scheduled per 8/10 los. Called and spoke with pt confirmed all added appts

## 2021-05-02 NOTE — Progress Notes (Addendum)
Pharmacy Follow-Up  Spoke to Dr. Lindi Adie regarding the continuation of Exjade as it had fallen off patients medication list. Per Dr. Lindi Adie, patient should continue medication and new script was sent in on 04/24/21.   Patient was contacted regarding medication although wanted to talk to MD prior to restarting.   Drema Halon, PharmD Hematology/Oncology Clinical Pharmacist [facility] [phone]

## 2021-05-03 ENCOUNTER — Inpatient Hospital Stay: Payer: Medicare Other

## 2021-05-03 ENCOUNTER — Other Ambulatory Visit: Payer: Self-pay

## 2021-05-03 DIAGNOSIS — D4620 Refractory anemia with excess of blasts, unspecified: Secondary | ICD-10-CM | POA: Diagnosis not present

## 2021-05-03 DIAGNOSIS — D649 Anemia, unspecified: Secondary | ICD-10-CM

## 2021-05-03 MED ORDER — ACETAMINOPHEN 325 MG PO TABS
ORAL_TABLET | ORAL | Status: AC
  Administered 2021-05-03: 650 mg via ORAL
  Filled 2021-05-03: qty 2

## 2021-05-03 MED ORDER — ACETAMINOPHEN 325 MG PO TABS: 650.0000 mg | ORAL_TABLET | Freq: Once | ORAL | Status: AC

## 2021-05-03 MED ORDER — SODIUM CHLORIDE 0.9% IV SOLUTION
250.0000 mL | Freq: Once | INTRAVENOUS | Status: AC
  Administered 2021-05-03: 250 mL via INTRAVENOUS
  Filled 2021-05-03: qty 250

## 2021-05-03 MED ORDER — DIPHENHYDRAMINE HCL 25 MG PO CAPS
ORAL_CAPSULE | ORAL | Status: AC
  Administered 2021-05-03: 25 mg via ORAL
  Filled 2021-05-03: qty 1

## 2021-05-03 MED ORDER — DIPHENHYDRAMINE HCL 25 MG PO CAPS: 25.0000 mg | ORAL_CAPSULE | Freq: Once | ORAL | Status: AC

## 2021-05-03 NOTE — Patient Instructions (Signed)
Blood Transfusion, Adult, Care After This sheet gives you information about how to care for yourself after your procedure. Your doctor may also give you more specific instructions. If youhave problems or questions, contact your doctor. What can I expect after the procedure? After the procedure, it is common to have: Bruising and soreness at the IV site. A fever or chills on the day of the procedure. This may be your body's response to the new blood cells received. A headache. Follow these instructions at home: Insertion site care     Follow instructions from your doctor about how to take care of your insertion site. This is where an IV tube was put into your vein. Make sure you: Wash your hands with soap and water before and after you change your bandage (dressing). If you cannot use soap and water, use hand sanitizer. Change your bandage as told by your doctor. Check your insertion site every day for signs of infection. Check for: Redness, swelling, or pain. Bleeding from the site. Warmth. Pus or a bad smell. General instructions Take over-the-counter and prescription medicines only as told by your doctor. Rest as told by your doctor. Go back to your normal activities as told by your doctor. Keep all follow-up visits as told by your doctor. This is important. Contact a doctor if: You have itching or red, swollen areas of skin (hives). You feel worried or nervous (anxious). You feel weak after doing your normal activities. You have redness, swelling, warmth, or pain around the insertion site. You have blood coming from the insertion site, and the blood does not stop with pressure. You have pus or a bad smell coming from the insertion site. Get help right away if: You have signs of a serious reaction. This may be coming from an allergy or the body's defense system (immune system). Signs include: Trouble breathing or shortness of breath. Swelling of the face or feeling warm  (flushed). Fever or chills. Head, chest, or back pain. Dark pee (urine) or blood in the pee. Widespread rash. Fast heartbeat. Feeling dizzy or light-headed. You may receive your blood transfusion in an outpatient setting. If so, youwill be told whom to contact to report any reactions. These symptoms may be an emergency. Do not wait to see if the symptoms will go away. Get medical help right away. Call your local emergency services (911 in the [place] Do not drive yourself to the hospital. Summary Bruising and soreness at the IV site are common. Check your insertion site every day for signs of infection. Rest as told by your doctor. Go back to your normal activities as told by your doctor. Get help right away if you have signs of a serious reaction. This information is not intended to replace advice given to you by your health care provider. Make sure you discuss any questions you have with your healthcare provider. Document Revised: 03/02/2019 Document Reviewed: 03/02/2019 Elsevier Patient Education  2022 Elsevier Inc.  

## 2021-05-05 LAB — BPAM RBC
Blood Product Expiration Date: 202209092359
Blood Product Expiration Date: 202209162359
ISSUE DATE / TIME: 202208130958
ISSUE DATE / TIME: 202208130958
Unit Type and Rh: 5100
Unit Type and Rh: 7300

## 2021-05-05 LAB — TYPE AND SCREEN
ABO/RH(D): B POS
Antibody Screen: POSITIVE
DAT, IgG: POSITIVE
Donor AG Type: NEGATIVE
Donor AG Type: NEGATIVE
Unit division: 0
Unit division: 0

## 2021-05-08 ENCOUNTER — Other Ambulatory Visit (HOSPITAL_COMMUNITY): Payer: Self-pay

## 2021-05-14 ENCOUNTER — Other Ambulatory Visit: Payer: Self-pay

## 2021-05-14 ENCOUNTER — Telehealth: Payer: Self-pay

## 2021-05-14 ENCOUNTER — Inpatient Hospital Stay: Payer: Medicare Other

## 2021-05-14 DIAGNOSIS — D649 Anemia, unspecified: Secondary | ICD-10-CM

## 2021-05-14 DIAGNOSIS — D4620 Refractory anemia with excess of blasts, unspecified: Secondary | ICD-10-CM | POA: Diagnosis not present

## 2021-05-14 DIAGNOSIS — D469 Myelodysplastic syndrome, unspecified: Secondary | ICD-10-CM

## 2021-05-14 LAB — CBC WITH DIFFERENTIAL (CANCER CENTER ONLY)
Abs Immature Granulocytes: 0 10*3/uL (ref 0.00–0.07)
Basophils Absolute: 0 10*3/uL (ref 0.0–0.1)
Basophils Relative: 0 %
Eosinophils Absolute: 0.1 10*3/uL (ref 0.0–0.5)
Eosinophils Relative: 3 %
HCT: 23 % — ABNORMAL LOW (ref 39.0–52.0)
Hemoglobin: 7.6 g/dL — ABNORMAL LOW (ref 13.0–17.0)
Immature Granulocytes: 0 %
Lymphocytes Relative: 33 %
Lymphs Abs: 1.2 10*3/uL (ref 0.7–4.0)
MCH: 28.7 pg (ref 26.0–34.0)
MCHC: 33 g/dL (ref 30.0–36.0)
MCV: 86.8 fL (ref 80.0–100.0)
Monocytes Absolute: 0.9 10*3/uL (ref 0.1–1.0)
Monocytes Relative: 25 %
Neutro Abs: 1.4 10*3/uL — ABNORMAL LOW (ref 1.7–7.7)
Neutrophils Relative %: 39 %
Platelet Count: 120 10*3/uL — ABNORMAL LOW (ref 150–400)
RBC: 2.65 MIL/uL — ABNORMAL LOW (ref 4.22–5.81)
RDW: 20.3 % — ABNORMAL HIGH (ref 11.5–15.5)
WBC Count: 3.6 10*3/uL — ABNORMAL LOW (ref 4.0–10.5)
nRBC: 0 % (ref 0.0–0.2)

## 2021-05-14 LAB — IRON AND TIBC
Iron: 185 ug/dL — ABNORMAL HIGH (ref 42–163)
Saturation Ratios: 103 % — ABNORMAL HIGH (ref 20–55)
TIBC: 179 ug/dL — ABNORMAL LOW (ref 202–409)
UIBC: UNDETERMINED ug/dL (ref 117–376)

## 2021-05-14 LAB — FERRITIN: Ferritin: 4337 ng/mL — ABNORMAL HIGH (ref 24–336)

## 2021-05-14 LAB — PREPARE RBC (CROSSMATCH)

## 2021-05-14 NOTE — Telephone Encounter (Signed)
MD recommendations for 1 unit of PRBC's due to Hgb of 7.6.   Patient scheduled for 05/17/2021.    Left message for patient's son to call back to confirm appointment.   Orders placed, blood bank notified.

## 2021-05-15 ENCOUNTER — Other Ambulatory Visit (HOSPITAL_COMMUNITY): Payer: Self-pay

## 2021-05-15 LAB — SAMPLE TO BLOOD BANK

## 2021-05-17 ENCOUNTER — Inpatient Hospital Stay: Payer: Medicare Other

## 2021-05-17 ENCOUNTER — Other Ambulatory Visit: Payer: Self-pay

## 2021-05-17 DIAGNOSIS — D4620 Refractory anemia with excess of blasts, unspecified: Secondary | ICD-10-CM | POA: Diagnosis not present

## 2021-05-17 DIAGNOSIS — D649 Anemia, unspecified: Secondary | ICD-10-CM

## 2021-05-17 MED ORDER — ACETAMINOPHEN 325 MG PO TABS
650.0000 mg | ORAL_TABLET | Freq: Once | ORAL | Status: AC
  Administered 2021-05-17: 650 mg via ORAL

## 2021-05-17 MED ORDER — ACETAMINOPHEN 325 MG PO TABS
ORAL_TABLET | ORAL | Status: AC
  Filled 2021-05-17: qty 2

## 2021-05-17 MED ORDER — DIPHENHYDRAMINE HCL 25 MG PO CAPS
25.0000 mg | ORAL_CAPSULE | Freq: Once | ORAL | Status: AC
  Administered 2021-05-17: 25 mg via ORAL

## 2021-05-17 MED ORDER — DIPHENHYDRAMINE HCL 25 MG PO CAPS
ORAL_CAPSULE | ORAL | Status: AC
  Filled 2021-05-17: qty 1

## 2021-05-17 NOTE — Patient Instructions (Signed)
Blood Transfusion, Adult, Care After This sheet gives you information about how to care for yourself after your procedure. Your doctor may also give you more specific instructions. If youhave problems or questions, contact your doctor. What can I expect after the procedure? After the procedure, it is common to have: Bruising and soreness at the IV site. A fever or chills on the day of the procedure. This may be your body's response to the new blood cells received. A headache. Follow these instructions at home: Insertion site care     Follow instructions from your doctor about how to take care of your insertion site. This is where an IV tube was put into your vein. Make sure you: Wash your hands with soap and water before and after you change your bandage (dressing). If you cannot use soap and water, use hand sanitizer. Change your bandage as told by your doctor. Check your insertion site every day for signs of infection. Check for: Redness, swelling, or pain. Bleeding from the site. Warmth. Pus or a bad smell. General instructions Take over-the-counter and prescription medicines only as told by your doctor. Rest as told by your doctor. Go back to your normal activities as told by your doctor. Keep all follow-up visits as told by your doctor. This is important. Contact a doctor if: You have itching or red, swollen areas of skin (hives). You feel worried or nervous (anxious). You feel weak after doing your normal activities. You have redness, swelling, warmth, or pain around the insertion site. You have blood coming from the insertion site, and the blood does not stop with pressure. You have pus or a bad smell coming from the insertion site. Get help right away if: You have signs of a serious reaction. This may be coming from an allergy or the body's defense system (immune system). Signs include: Trouble breathing or shortness of breath. Swelling of the face or feeling warm  (flushed). Fever or chills. Head, chest, or back pain. Dark pee (urine) or blood in the pee. Widespread rash. Fast heartbeat. Feeling dizzy or light-headed. You may receive your blood transfusion in an outpatient setting. If so, youwill be told whom to contact to report any reactions. These symptoms may be an emergency. Do not wait to see if the symptoms will go away. Get medical help right away. Call your local emergency services (911 in the [place] Do not drive yourself to the hospital. Summary Bruising and soreness at the IV site are common. Check your insertion site every day for signs of infection. Rest as told by your doctor. Go back to your normal activities as told by your doctor. Get help right away if you have signs of a serious reaction. This information is not intended to replace advice given to you by your health care provider. Make sure you discuss any questions you have with your healthcare provider. Document Revised: 03/02/2019 Document Reviewed: 03/02/2019 Elsevier Patient Education  2022 Elsevier Inc.  

## 2021-05-19 LAB — TYPE AND SCREEN
ABO/RH(D): B POS
Antibody Screen: POSITIVE
DAT, IgG: POSITIVE
Donor AG Type: NEGATIVE
Donor AG Type: NEGATIVE
Unit division: 0
Unit division: 0

## 2021-05-19 LAB — BPAM RBC
Blood Product Expiration Date: 202209232359
Blood Product Expiration Date: 202210022359
ISSUE DATE / TIME: 202208270937
Unit Type and Rh: 7300
Unit Type and Rh: 7300

## 2021-05-20 ENCOUNTER — Other Ambulatory Visit: Payer: Self-pay | Admitting: Cardiovascular Disease

## 2021-05-20 NOTE — Telephone Encounter (Signed)
Prescription refill request for Eliquis received.  Indication: Afib  Last office visit: Nahser, 12/31/2020 Scr: 1.20, 12/12/2020 Age: 87 yo  Weight: 83.9 kg

## 2021-05-27 ENCOUNTER — Other Ambulatory Visit (HOSPITAL_COMMUNITY): Payer: Self-pay

## 2021-05-27 NOTE — Progress Notes (Signed)
Patient Care Team: Joycelyn Man, NP as PCP - General (Nurse Practitioner) Nahser, Deloris Ping, MD as PCP - Cardiology (Cardiology)  DIAGNOSIS:    ICD-10-CM   1. Myelodysplastic syndrome ([facility])  D46.9       CHIEF COMPLIANT:  Follow-up of myelodysplastic syndrome  INTERVAL HISTORY: Ricky [name] is a 87 y.o. with above-mentioned history of  myelodysplastic syndrome who last received a blood transfusions of 1 unit of PRBCs on 05/17/2021 and also receiving Aranesp (last 02/20/21). Labs on 05/14/21 showed RBC 2.65, Hg 7.6, HCT 23.0, PLT 120, Iron sat 103%, Ferritin 4,337. He presents to the clinic today for follow-up.  ALLERGIES:  has No Known Allergies.  MEDICATIONS:  Current Outpatient Medications  Medication Sig Dispense Refill   cholecalciferol (VITAMIN D3) 25 MCG (1000 UNIT) tablet Take 1,000 Units by mouth daily.     cyanocobalamin 1000 MCG tablet Take 1,000 mcg by mouth daily.     deferasirox (EXJADE) 500 MG disintegrating tablet Take 2 tablets (1,000 mg total) by mouth daily before breakfast. 60 tablet 3   ELIQUIS 5 MG TABS tablet TAKE 1 TABLET BY MOUTH TWICE A DAY 180 tablet 1   Magnesium 250 MG TABS Take 1 tablet (250 mg total) by mouth daily.  0   simvastatin (ZOCOR) 20 MG tablet Take 20 mg by mouth daily.     traMADol (ULTRAM) 50 MG tablet Take 1 tablet (50 mg total) by mouth every 12 (twelve) hours as needed. 10 tablet 0   No current facility-administered medications for this visit.    PHYSICAL EXAMINATION: ECOG PERFORMANCE STATUS: 3 - Symptomatic, >50% confined to bed  Vitals:   05/28/21 0948  BP: (!) 99/49  Pulse: 87  Resp: 18  Temp: (!) 97 F (36.1 C)  SpO2: 98%   Filed Weights   05/28/21 0948  Weight: 186 lb 8 oz (84.6 kg)    LABORATORY DATA:  I have reviewed the data as listed CMP Latest Ref Rng & Units 12/26/2020 12/13/2020 12/12/2020  Glucose 70 - 99 mg/dL 536(T) 255(L) 783(A)  BUN 8 - 23 mg/dL 52(W) 23 18(A)  Creatinine 0.61 - 1.24 mg/dL  5.77 1.10 7.89  Sodium 135 - 145 mmol/L 135 130(L) 133(L)  Potassium 3.5 - 5.1 mmol/L 4.2 4.1 4.0  Chloride 98 - 111 mmol/L 92(L) 97(L) 95(L)  CO2 22 - 32 mmol/L 31 25 -  Calcium 8.9 - 10.3 mg/dL 1.7(Y) 7.9(L) -  Total Protein 6.5 - 8.1 g/dL 9.0(H) 7.1 -  Total Bilirubin 0.3 - 1.2 mg/dL 0.9 3.9(N) -  Alkaline Phos 38 - 126 U/L 200(H) 85 -  AST 15 - 41 U/L 22 26 -  ALT 0 - 44 U/L 57(H) 47(H) -    Lab Results  Component Value Date   WBC 3.6 (L) 05/28/2021   HGB 7.3 (L) 05/28/2021   HCT 22.2 (L) 05/28/2021   MCV 87.7 05/28/2021   PLT 116 (L) 05/28/2021   NEUTROABS 1.1 (L) 05/28/2021    ASSESSMENT & PLAN:  Myelodysplastic syndrome ([facility]) Treatment plan:  Aranesp every 3 weeks 500 mcg (increased from 300 mcg) and supportive care with blood transfusions   Lab review: 01/29/2020: Hemoglobin 7.8, MCV 103.4, platelets 149 03/28/2020: Hemoglobin is 9 (it was 6.9 on 03/22/2020) 05/23/2020: Hemoglobin 7.1 (received blood transfusion)  07/05/2020: Hemoglobin 6.2 (received blood transfusion) 07/26/2020: Hemoglobin 7.3 (received blood transfusion) 03/06/2021: Hemoglobin 5.4 (2 units PRBC) 04/30/2021: Hemoglobin 6.9 (2 units PRBC 05/28/2021: Hemoglobin 7.3   Bone marrow biopsy: Hypercellular bone  marrow for age 72 to 60% cellularity with dyspoietic changes associated with abundant ring sideroblasts.  No increase in blastic cells identified.  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  It looks like the patient is stable on every 2-week blood transfusions. RTC in 2 months   No orders of the defined types were placed in this encounter.  The patient has a good understanding of the overall plan. he agrees with it. he will call with any problems that may develop before the next visit here.  Total time spent: 20 mins including face to face time and time spent for planning, charting and coordination of care  Rulon Eisenmenger, MD, MPH 05/28/2021  I, Thana Ates, am acting as scribe for Dr. Nicholas Lose.  I have reviewed the above documentation for accuracy and completeness, and I agree with the above.

## 2021-05-28 ENCOUNTER — Inpatient Hospital Stay (HOSPITAL_BASED_OUTPATIENT_CLINIC_OR_DEPARTMENT_OTHER): Payer: Medicare Other | Admitting: Hematology and Oncology

## 2021-05-28 ENCOUNTER — Other Ambulatory Visit: Payer: Self-pay

## 2021-05-28 ENCOUNTER — Inpatient Hospital Stay: Payer: Medicare Other | Attending: Hematology and Oncology

## 2021-05-28 DIAGNOSIS — D469 Myelodysplastic syndrome, unspecified: Secondary | ICD-10-CM | POA: Insufficient documentation

## 2021-05-28 LAB — FERRITIN: Ferritin: 4282 ng/mL — ABNORMAL HIGH (ref 24–336)

## 2021-05-28 LAB — CBC WITH DIFFERENTIAL (CANCER CENTER ONLY)
Abs Immature Granulocytes: 0.01 10*3/uL (ref 0.00–0.07)
Basophils Absolute: 0 10*3/uL (ref 0.0–0.1)
Basophils Relative: 0 %
Eosinophils Absolute: 0.1 10*3/uL (ref 0.0–0.5)
Eosinophils Relative: 3 %
HCT: 22.2 % — ABNORMAL LOW (ref 39.0–52.0)
Hemoglobin: 7.3 g/dL — ABNORMAL LOW (ref 13.0–17.0)
Immature Granulocytes: 0 %
Lymphocytes Relative: 36 %
Lymphs Abs: 1.2 10*3/uL (ref 0.7–4.0)
MCH: 28.9 pg (ref 26.0–34.0)
MCHC: 32.9 g/dL (ref 30.0–36.0)
MCV: 87.7 fL (ref 80.0–100.0)
Monocytes Absolute: 1.1 10*3/uL — ABNORMAL HIGH (ref 0.1–1.0)
Monocytes Relative: 31 %
Neutro Abs: 1.1 10*3/uL — ABNORMAL LOW (ref 1.7–7.7)
Neutrophils Relative %: 30 %
Platelet Count: 116 10*3/uL — ABNORMAL LOW (ref 150–400)
RBC: 2.53 MIL/uL — ABNORMAL LOW (ref 4.22–5.81)
RDW: 19.6 % — ABNORMAL HIGH (ref 11.5–15.5)
WBC Count: 3.6 10*3/uL — ABNORMAL LOW (ref 4.0–10.5)
nRBC: 0 % (ref 0.0–0.2)

## 2021-05-28 LAB — IRON AND TIBC
Iron: 187 ug/dL — ABNORMAL HIGH (ref 42–163)
Saturation Ratios: 105 % — ABNORMAL HIGH (ref 20–55)
TIBC: 178 ug/dL — ABNORMAL LOW (ref 202–409)
UIBC: UNDETERMINED ug/dL (ref 117–376)

## 2021-05-28 LAB — SAMPLE TO BLOOD BANK

## 2021-05-28 NOTE — Assessment & Plan Note (Signed)
Treatment plan:Aranesp every 3 weeks 500 mcg (increased from 300 mcg)and supportive care with blood transfusions  Lab review: 01/29/2020: Hemoglobin 7.8, MCV 103.4, platelets 149 03/28/2020: Hemoglobin is 9 (it was 6.9 on 03/22/2020) 05/23/2020: Hemoglobin 7.1(received blood transfusion) 07/05/2020: Hemoglobin 6.2 (received blood transfusion) 07/26/2020: Hemoglobin 7.3 (received blood transfusion) 03/06/2021: Hemoglobin 5.4 (2 units PRBC) 04/30/2021: Hemoglobin 6.9 (2 units PRBC  Bone marrow biopsy: Hypercellular bone marrow for age 85 to 60% cellularity with dyspoietic changes associated with abundant ring sideroblasts. No increase in blastic cells identified. 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

## 2021-05-29 ENCOUNTER — Other Ambulatory Visit (HOSPITAL_COMMUNITY): Payer: Self-pay

## 2021-05-30 ENCOUNTER — Other Ambulatory Visit: Payer: Self-pay | Admitting: *Deleted

## 2021-05-30 DIAGNOSIS — D649 Anemia, unspecified: Secondary | ICD-10-CM

## 2021-05-30 LAB — PREPARE RBC (CROSSMATCH)

## 2021-05-30 NOTE — Progress Notes (Signed)
Per MD request RN placed orders for pt to receive 2 units PRBC's tomorrow 05/31/21.

## 2021-05-31 ENCOUNTER — Inpatient Hospital Stay: Payer: Medicare Other

## 2021-05-31 ENCOUNTER — Other Ambulatory Visit: Payer: Self-pay

## 2021-05-31 DIAGNOSIS — D649 Anemia, unspecified: Secondary | ICD-10-CM

## 2021-05-31 DIAGNOSIS — D469 Myelodysplastic syndrome, unspecified: Secondary | ICD-10-CM | POA: Diagnosis not present

## 2021-05-31 MED ORDER — DIPHENHYDRAMINE HCL 25 MG PO CAPS
25.0000 mg | ORAL_CAPSULE | Freq: Once | ORAL | Status: AC
  Administered 2021-05-31: 25 mg via ORAL
  Filled 2021-05-31: qty 1

## 2021-05-31 MED ORDER — ACETAMINOPHEN 325 MG PO TABS
650.0000 mg | ORAL_TABLET | Freq: Once | ORAL | Status: AC
  Administered 2021-05-31: 650 mg via ORAL
  Filled 2021-05-31: qty 2

## 2021-05-31 MED ORDER — SODIUM CHLORIDE 0.9% IV SOLUTION
250.0000 mL | Freq: Once | INTRAVENOUS | Status: AC
  Administered 2021-05-31: 250 mL via INTRAVENOUS

## 2021-05-31 NOTE — Patient Instructions (Signed)
Blood Transfusion, Adult, Care After This sheet gives you information about how to care for yourself after your procedure. Your doctor may also give you more specific instructions. If you have problems or questions, contact your doctor. What can I expect after the procedure? After the procedure, it is common to have: Bruising and soreness at the IV site. A headache. Follow these instructions at home: Insertion site care   Follow instructions from your doctor about how to take care of your insertion site. This is where an IV tube was put into your vein. Make sure you: Wash your hands with soap and water before and after you change your bandage (dressing). If you cannot use soap and water, use hand sanitizer. Change your bandage as told by your doctor. Check your insertion site every day for signs of infection. Check for: Redness, swelling, or pain. Bleeding from the site. Warmth. Pus or a bad smell. General instructions Take over-the-counter and prescription medicines only as told by your doctor. Rest as told by your doctor. Go back to your normal activities as told by your doctor. Keep all follow-up visits as told by your doctor. This is important. Contact a doctor if: You have itching or red, swollen areas of skin (hives). You feel worried or nervous (anxious). You feel weak after doing your normal activities. You have redness, swelling, warmth, or pain around the insertion site. You have blood coming from the insertion site, and the blood does not stop with pressure. You have pus or a bad smell coming from the insertion site. Get help right away if: You have signs of a serious reaction. This may be coming from an allergy or the body's defense system (immune system). Signs include: Trouble breathing or shortness of breath. Swelling of the face or feeling warm (flushed). Fever or chills. Head, chest, or back pain. Dark pee (urine) or blood in the pee. Widespread rash. Fast  heartbeat. Feeling dizzy or light-headed. You may receive your blood transfusion in an outpatient setting. If so, you will be told whom to contact to report any reactions. These symptoms may be an emergency. Do not wait to see if the symptoms will go away. Get medical help right away. Call your local emergency services (911 in the [place] Do not drive yourself to the hospital. Summary Bruising and soreness at the IV site are common. Check your insertion site every day for signs of infection. Rest as told by your doctor. Go back to your normal activities as told by your doctor. Get help right away if you have signs of a serious reaction. This information is not intended to replace advice given to you by your health care provider. Make sure you discuss any questions you have with your health care provider. Document Revised: 01/02/2021 Document Reviewed: 03/02/2019 Elsevier Patient Education  2022 Elsevier Inc.  

## 2021-06-02 LAB — BPAM RBC
Blood Product Expiration Date: 202210192359
Blood Product Expiration Date: 202210192359
ISSUE DATE / TIME: 202209100949
ISSUE DATE / TIME: 202209100949
Unit Type and Rh: 5100
Unit Type and Rh: 5100

## 2021-06-02 LAB — TYPE AND SCREEN
ABO/RH(D): B POS
Antibody Screen: POSITIVE
DAT, IgG: NEGATIVE
Donor AG Type: NEGATIVE
Donor AG Type: NEGATIVE
Unit division: 0
Unit division: 0

## 2021-06-03 ENCOUNTER — Other Ambulatory Visit (HOSPITAL_COMMUNITY): Payer: Self-pay

## 2021-06-04 ENCOUNTER — Other Ambulatory Visit (HOSPITAL_COMMUNITY): Payer: Self-pay

## 2021-06-11 ENCOUNTER — Other Ambulatory Visit: Payer: Self-pay

## 2021-06-11 ENCOUNTER — Inpatient Hospital Stay: Payer: Medicare Other

## 2021-06-11 DIAGNOSIS — D469 Myelodysplastic syndrome, unspecified: Secondary | ICD-10-CM | POA: Diagnosis not present

## 2021-06-11 LAB — CBC WITH DIFFERENTIAL (CANCER CENTER ONLY)
Abs Immature Granulocytes: 0.02 10*3/uL (ref 0.00–0.07)
Basophils Absolute: 0 10*3/uL (ref 0.0–0.1)
Basophils Relative: 0 %
Eosinophils Absolute: 0.2 10*3/uL (ref 0.0–0.5)
Eosinophils Relative: 3 %
HCT: 26.2 % — ABNORMAL LOW (ref 39.0–52.0)
Hemoglobin: 8.5 g/dL — ABNORMAL LOW (ref 13.0–17.0)
Immature Granulocytes: 0 %
Lymphocytes Relative: 24 %
Lymphs Abs: 1.3 10*3/uL (ref 0.7–4.0)
MCH: 28.3 pg (ref 26.0–34.0)
MCHC: 32.4 g/dL (ref 30.0–36.0)
MCV: 87.3 fL (ref 80.0–100.0)
Monocytes Absolute: 1.6 10*3/uL — ABNORMAL HIGH (ref 0.1–1.0)
Monocytes Relative: 31 %
Neutro Abs: 2.1 10*3/uL (ref 1.7–7.7)
Neutrophils Relative %: 42 %
Platelet Count: 157 10*3/uL (ref 150–400)
RBC: 3 MIL/uL — ABNORMAL LOW (ref 4.22–5.81)
RDW: 18.1 % — ABNORMAL HIGH (ref 11.5–15.5)
WBC Count: 5.2 10*3/uL (ref 4.0–10.5)
nRBC: 0.4 % — ABNORMAL HIGH (ref 0.0–0.2)

## 2021-06-11 LAB — IRON AND TIBC
Iron: 167 ug/dL — ABNORMAL HIGH (ref 42–163)
Saturation Ratios: 101 % — ABNORMAL HIGH (ref 20–55)
TIBC: 166 ug/dL — ABNORMAL LOW (ref 202–409)
UIBC: UNDETERMINED ug/dL (ref 117–376)

## 2021-06-11 LAB — SAMPLE TO BLOOD BANK

## 2021-06-11 LAB — FERRITIN: Ferritin: 5037 ng/mL — ABNORMAL HIGH (ref 24–336)

## 2021-06-13 ENCOUNTER — Other Ambulatory Visit: Payer: Self-pay

## 2021-06-13 ENCOUNTER — Telehealth: Payer: Self-pay | Admitting: *Deleted

## 2021-06-13 ENCOUNTER — Inpatient Hospital Stay: Payer: Medicare Other

## 2021-06-13 NOTE — Telephone Encounter (Signed)
Pt showed for blood transfusion today.  Hgb from 9/21 was 8.5.  Notified Dr Lindi Adie & blood held for today.  Pt to return in 2 wks but informed to call with any problems. Blood Bank notified.

## 2021-06-23 ENCOUNTER — Other Ambulatory Visit (HOSPITAL_COMMUNITY): Payer: Self-pay

## 2021-06-24 ENCOUNTER — Other Ambulatory Visit (HOSPITAL_COMMUNITY): Payer: Self-pay

## 2021-06-25 ENCOUNTER — Other Ambulatory Visit: Payer: Self-pay | Admitting: *Deleted

## 2021-06-25 ENCOUNTER — Other Ambulatory Visit: Payer: Self-pay

## 2021-06-25 ENCOUNTER — Inpatient Hospital Stay: Payer: Medicare Other | Attending: Hematology and Oncology

## 2021-06-25 DIAGNOSIS — I959 Hypotension, unspecified: Secondary | ICD-10-CM | POA: Diagnosis not present

## 2021-06-25 DIAGNOSIS — D649 Anemia, unspecified: Secondary | ICD-10-CM

## 2021-06-25 DIAGNOSIS — D469 Myelodysplastic syndrome, unspecified: Secondary | ICD-10-CM | POA: Insufficient documentation

## 2021-06-25 LAB — IRON AND TIBC
Iron: 181 ug/dL — ABNORMAL HIGH (ref 42–163)
Saturation Ratios: 101 % — ABNORMAL HIGH (ref 20–55)
TIBC: 179 ug/dL — ABNORMAL LOW (ref 202–409)
UIBC: UNDETERMINED ug/dL (ref 117–376)

## 2021-06-25 LAB — CBC WITH DIFFERENTIAL (CANCER CENTER ONLY)
Abs Immature Granulocytes: 0.02 10*3/uL (ref 0.00–0.07)
Basophils Absolute: 0 10*3/uL (ref 0.0–0.1)
Basophils Relative: 0 %
Eosinophils Absolute: 0.1 10*3/uL (ref 0.0–0.5)
Eosinophils Relative: 2 %
HCT: 21.2 % — ABNORMAL LOW (ref 39.0–52.0)
Hemoglobin: 6.8 g/dL — CL (ref 13.0–17.0)
Immature Granulocytes: 0 %
Lymphocytes Relative: 17 %
Lymphs Abs: 1 10*3/uL (ref 0.7–4.0)
MCH: 28.1 pg (ref 26.0–34.0)
MCHC: 32.1 g/dL (ref 30.0–36.0)
MCV: 87.6 fL (ref 80.0–100.0)
Monocytes Absolute: 2.1 10*3/uL — ABNORMAL HIGH (ref 0.1–1.0)
Monocytes Relative: 37 %
Neutro Abs: 2.4 10*3/uL (ref 1.7–7.7)
Neutrophils Relative %: 44 %
Platelet Count: 129 10*3/uL — ABNORMAL LOW (ref 150–400)
RBC: 2.42 MIL/uL — ABNORMAL LOW (ref 4.22–5.81)
RDW: 17.9 % — ABNORMAL HIGH (ref 11.5–15.5)
WBC Count: 5.6 10*3/uL (ref 4.0–10.5)
nRBC: 0 % (ref 0.0–0.2)

## 2021-06-25 LAB — SAMPLE TO BLOOD BANK

## 2021-06-25 LAB — PREPARE RBC (CROSSMATCH)

## 2021-06-25 LAB — FERRITIN: Ferritin: 4852 ng/mL — ABNORMAL HIGH (ref 24–336)

## 2021-06-25 NOTE — Progress Notes (Signed)
CRITICAL VALUE STICKER  CRITICAL VALUE: Hgb 6.8  Geraldo Docker, RN  DATE & TIME NOTIFIED: 06/25/21 at 42   MD NOTIFIED: Nicholas Lose, MD    RESPONSE:  MD notified, verbal orders received for pt to receive 2 units PRBC's.  Orders placed, prepare released and RN verified with blood bank.

## 2021-06-27 ENCOUNTER — Inpatient Hospital Stay: Payer: Medicare Other

## 2021-06-27 ENCOUNTER — Other Ambulatory Visit: Payer: Self-pay

## 2021-06-27 DIAGNOSIS — D469 Myelodysplastic syndrome, unspecified: Secondary | ICD-10-CM | POA: Diagnosis not present

## 2021-06-27 DIAGNOSIS — D649 Anemia, unspecified: Secondary | ICD-10-CM

## 2021-06-27 MED ORDER — SODIUM CHLORIDE 0.9% IV SOLUTION
250.0000 mL | Freq: Once | INTRAVENOUS | Status: DC
Start: 2021-06-27 — End: 2021-06-27

## 2021-06-27 MED ORDER — DIPHENHYDRAMINE HCL 25 MG PO CAPS
25.0000 mg | ORAL_CAPSULE | Freq: Once | ORAL | Status: AC
  Administered 2021-06-27: 25 mg via ORAL
  Filled 2021-06-27: qty 1

## 2021-06-27 MED ORDER — ACETAMINOPHEN 325 MG PO TABS
650.0000 mg | ORAL_TABLET | Freq: Once | ORAL | Status: AC
  Administered 2021-06-27: 650 mg via ORAL
  Filled 2021-06-27: qty 2

## 2021-06-30 LAB — BPAM RBC
Blood Product Expiration Date: 202211102359
Blood Product Expiration Date: 202211102359
ISSUE DATE / TIME: 202210070853
ISSUE DATE / TIME: 202210070853
Unit Type and Rh: 5100
Unit Type and Rh: 5100

## 2021-06-30 LAB — TYPE AND SCREEN
ABO/RH(D): B POS
Antibody Screen: POSITIVE
DAT, IgG: NEGATIVE
Donor AG Type: NEGATIVE
Donor AG Type: NEGATIVE
Unit division: 0
Unit division: 0

## 2021-07-08 ENCOUNTER — Ambulatory Visit (INDEPENDENT_AMBULATORY_CARE_PROVIDER_SITE_OTHER): Payer: Medicare Other

## 2021-07-08 DIAGNOSIS — I495 Sick sinus syndrome: Secondary | ICD-10-CM | POA: Diagnosis not present

## 2021-07-08 LAB — CUP PACEART REMOTE DEVICE CHECK
Battery Remaining Longevity: 65 mo
Battery Remaining Percentage: 60 %
Battery Voltage: 2.99 V
Brady Statistic AP VP Percent: 1 %
Brady Statistic AP VS Percent: 2 %
Brady Statistic AS VP Percent: 1.7 %
Brady Statistic AS VS Percent: 96 %
Brady Statistic RA Percent Paced: 1.3 %
Brady Statistic RV Percent Paced: 1.7 %
Date Time Interrogation Session: 20221017033020
Implantable Lead Implant Date: 20090807
Implantable Lead Implant Date: 20090807
Implantable Lead Location: 753859
Implantable Lead Location: 753860
Implantable Pulse Generator Implant Date: 20170719
Lead Channel Impedance Value: 340 Ohm
Lead Channel Impedance Value: 360 Ohm
Lead Channel Pacing Threshold Amplitude: 0.5 V
Lead Channel Pacing Threshold Amplitude: 1.25 V
Lead Channel Pacing Threshold Pulse Width: 0.5 ms
Lead Channel Pacing Threshold Pulse Width: 1 ms
Lead Channel Sensing Intrinsic Amplitude: 3.3 mV
Lead Channel Sensing Intrinsic Amplitude: 6.8 mV
Lead Channel Setting Pacing Amplitude: 2.25 V
Lead Channel Setting Pacing Amplitude: 2.5 V
Lead Channel Setting Pacing Pulse Width: 1 ms
Lead Channel Setting Sensing Sensitivity: 2 mV
Pulse Gen Model: 2272
Pulse Gen Serial Number: 7927766

## 2021-07-09 ENCOUNTER — Other Ambulatory Visit: Payer: Self-pay

## 2021-07-09 ENCOUNTER — Other Ambulatory Visit (HOSPITAL_COMMUNITY): Payer: Self-pay

## 2021-07-09 ENCOUNTER — Inpatient Hospital Stay: Payer: Medicare Other

## 2021-07-09 DIAGNOSIS — D469 Myelodysplastic syndrome, unspecified: Secondary | ICD-10-CM | POA: Diagnosis not present

## 2021-07-09 LAB — CBC WITH DIFFERENTIAL (CANCER CENTER ONLY)
Abs Immature Granulocytes: 0.01 10*3/uL (ref 0.00–0.07)
Basophils Absolute: 0 10*3/uL (ref 0.0–0.1)
Basophils Relative: 0 %
Eosinophils Absolute: 0.1 10*3/uL (ref 0.0–0.5)
Eosinophils Relative: 3 %
HCT: 24.8 % — ABNORMAL LOW (ref 39.0–52.0)
Hemoglobin: 8.1 g/dL — ABNORMAL LOW (ref 13.0–17.0)
Immature Granulocytes: 0 %
Lymphocytes Relative: 28 %
Lymphs Abs: 1.3 10*3/uL (ref 0.7–4.0)
MCH: 28.5 pg (ref 26.0–34.0)
MCHC: 32.7 g/dL (ref 30.0–36.0)
MCV: 87.3 fL (ref 80.0–100.0)
Monocytes Absolute: 1.5 10*3/uL — ABNORMAL HIGH (ref 0.1–1.0)
Monocytes Relative: 32 %
Neutro Abs: 1.7 10*3/uL (ref 1.7–7.7)
Neutrophils Relative %: 37 %
Platelet Count: 134 10*3/uL — ABNORMAL LOW (ref 150–400)
RBC: 2.84 MIL/uL — ABNORMAL LOW (ref 4.22–5.81)
RDW: 15.9 % — ABNORMAL HIGH (ref 11.5–15.5)
WBC Count: 4.7 10*3/uL (ref 4.0–10.5)
nRBC: 0 % (ref 0.0–0.2)

## 2021-07-09 LAB — SAMPLE TO BLOOD BANK

## 2021-07-10 LAB — IRON AND TIBC
Iron: 181 ug/dL — ABNORMAL HIGH (ref 42–163)
Saturation Ratios: 104 % — ABNORMAL HIGH (ref 20–55)
TIBC: 175 ug/dL — ABNORMAL LOW (ref 202–409)
UIBC: UNDETERMINED ug/dL (ref 117–376)

## 2021-07-10 LAB — FERRITIN: Ferritin: 4803 ng/mL — ABNORMAL HIGH (ref 24–336)

## 2021-07-11 ENCOUNTER — Other Ambulatory Visit: Payer: Self-pay | Admitting: *Deleted

## 2021-07-11 ENCOUNTER — Inpatient Hospital Stay: Payer: Medicare Other

## 2021-07-11 ENCOUNTER — Other Ambulatory Visit: Payer: Self-pay

## 2021-07-11 ENCOUNTER — Other Ambulatory Visit: Payer: Self-pay | Admitting: Hematology and Oncology

## 2021-07-11 VITALS — BP 108/62 | HR 76 | Resp 18

## 2021-07-11 DIAGNOSIS — D649 Anemia, unspecified: Secondary | ICD-10-CM

## 2021-07-11 DIAGNOSIS — D469 Myelodysplastic syndrome, unspecified: Secondary | ICD-10-CM | POA: Diagnosis not present

## 2021-07-11 DIAGNOSIS — I959 Hypotension, unspecified: Secondary | ICD-10-CM

## 2021-07-11 MED ORDER — SODIUM CHLORIDE 0.9 % IV SOLN: Freq: Once | INTRAVENOUS | Status: AC

## 2021-07-11 NOTE — Patient Instructions (Signed)

## 2021-07-11 NOTE — Progress Notes (Signed)
Pt BP 88/57 while hgb 8.1.  Per MD on call pt to receive 1L of NS over two hours and hold on PRBC's at this time.

## 2021-07-11 NOTE — Progress Notes (Signed)
Pt hgb 8.1.  per MD pt needing 1 unit of PRBC's.  Orders placed.

## 2021-07-14 ENCOUNTER — Encounter: Payer: Self-pay | Admitting: *Deleted

## 2021-07-14 ENCOUNTER — Encounter: Payer: Self-pay | Admitting: Hematology and Oncology

## 2021-07-14 ENCOUNTER — Other Ambulatory Visit (HOSPITAL_COMMUNITY): Payer: Self-pay

## 2021-07-14 NOTE — Progress Notes (Signed)
Verbal standing orders received from Dr. Lindi Adie to transfuse pt with 1 unit PRBC's if pt Hgb is 7.5 or below and 2 units of PRBC's if Hgb is 6.9 or below.

## 2021-07-16 ENCOUNTER — Other Ambulatory Visit (HOSPITAL_COMMUNITY): Payer: Self-pay

## 2021-07-17 NOTE — Progress Notes (Signed)
Remote pacemaker transmission.   

## 2021-07-23 ENCOUNTER — Other Ambulatory Visit: Payer: Self-pay

## 2021-07-23 ENCOUNTER — Inpatient Hospital Stay: Payer: Medicare Other | Attending: Hematology and Oncology

## 2021-07-23 ENCOUNTER — Other Ambulatory Visit: Payer: Self-pay | Admitting: *Deleted

## 2021-07-23 DIAGNOSIS — D46A Refractory cytopenia with multilineage dysplasia: Secondary | ICD-10-CM | POA: Diagnosis not present

## 2021-07-23 DIAGNOSIS — D469 Myelodysplastic syndrome, unspecified: Secondary | ICD-10-CM

## 2021-07-23 DIAGNOSIS — D649 Anemia, unspecified: Secondary | ICD-10-CM

## 2021-07-23 LAB — CBC WITH DIFFERENTIAL (CANCER CENTER ONLY)
Abs Immature Granulocytes: 0.01 10*3/uL (ref 0.00–0.07)
Basophils Absolute: 0 10*3/uL (ref 0.0–0.1)
Basophils Relative: 0 %
Eosinophils Absolute: 0.1 10*3/uL (ref 0.0–0.5)
Eosinophils Relative: 2 %
HCT: 18.6 % — ABNORMAL LOW (ref 39.0–52.0)
Hemoglobin: 6.1 g/dL — CL (ref 13.0–17.0)
Immature Granulocytes: 0 %
Lymphocytes Relative: 29 %
Lymphs Abs: 1.3 10*3/uL (ref 0.7–4.0)
MCH: 28.8 pg (ref 26.0–34.0)
MCHC: 32.8 g/dL (ref 30.0–36.0)
MCV: 87.7 fL (ref 80.0–100.0)
Monocytes Absolute: 1.5 10*3/uL — ABNORMAL HIGH (ref 0.1–1.0)
Monocytes Relative: 33 %
Neutro Abs: 1.6 10*3/uL — ABNORMAL LOW (ref 1.7–7.7)
Neutrophils Relative %: 36 %
Platelet Count: 128 10*3/uL — ABNORMAL LOW (ref 150–400)
RBC: 2.12 MIL/uL — ABNORMAL LOW (ref 4.22–5.81)
RDW: 16.5 % — ABNORMAL HIGH (ref 11.5–15.5)
WBC Count: 4.6 10*3/uL (ref 4.0–10.5)
nRBC: 0 % (ref 0.0–0.2)

## 2021-07-23 LAB — IRON AND TIBC
Iron: 182 ug/dL — ABNORMAL HIGH (ref 42–163)
Saturation Ratios: 104 % — ABNORMAL HIGH (ref 20–55)
TIBC: 175 ug/dL — ABNORMAL LOW (ref 202–409)
UIBC: UNDETERMINED ug/dL (ref 117–376)

## 2021-07-23 LAB — FERRITIN: Ferritin: 4967 ng/mL — ABNORMAL HIGH (ref 24–336)

## 2021-07-23 NOTE — Progress Notes (Signed)
CRITICAL VALUE STICKER  CRITICAL VALUE: Hgb 6.1  RECEIVER (on-site recipient of call): Merleen Nicely, [place] NOTIFIED: 07/23/21 at 1445  MD NOTIFIED: Nicholas Lose, MD   RESPONSE: MD notified and verbal orders received for pt to receive 2 units PRBC's.  Orders placed.

## 2021-07-24 LAB — PREPARE RBC (CROSSMATCH)

## 2021-07-24 LAB — SAMPLE TO BLOOD BANK

## 2021-07-25 ENCOUNTER — Inpatient Hospital Stay: Payer: Medicare Other

## 2021-07-25 ENCOUNTER — Other Ambulatory Visit: Payer: Self-pay

## 2021-07-25 DIAGNOSIS — D46A Refractory cytopenia with multilineage dysplasia: Secondary | ICD-10-CM | POA: Diagnosis not present

## 2021-07-25 DIAGNOSIS — D649 Anemia, unspecified: Secondary | ICD-10-CM

## 2021-07-25 MED ORDER — DIPHENHYDRAMINE HCL 25 MG PO CAPS
25.0000 mg | ORAL_CAPSULE | Freq: Once | ORAL | Status: AC
  Administered 2021-07-25: 25 mg via ORAL
  Filled 2021-07-25: qty 1

## 2021-07-25 MED ORDER — ACETAMINOPHEN 325 MG PO TABS
650.0000 mg | ORAL_TABLET | Freq: Once | ORAL | Status: AC
  Administered 2021-07-25: 650 mg via ORAL
  Filled 2021-07-25: qty 2

## 2021-07-25 MED ORDER — SODIUM CHLORIDE 0.9% IV SOLUTION
250.0000 mL | Freq: Once | INTRAVENOUS | Status: AC
  Administered 2021-07-25: 250 mL via INTRAVENOUS

## 2021-07-25 NOTE — Patient Instructions (Signed)
Blood Transfusion, Adult, Care After This sheet gives you information about how to care for yourself after your procedure. Your doctor may also give you more specific instructions. If you have problems or questions, contact your doctor. What can I expect after the procedure? After the procedure, it is common to have: Bruising and soreness at the IV site. A headache. Follow these instructions at home: Insertion site care   Follow instructions from your doctor about how to take care of your insertion site. This is where an IV tube was put into your vein. Make sure you: Wash your hands with soap and water before and after you change your bandage (dressing). If you cannot use soap and water, use hand sanitizer. Change your bandage as told by your doctor. Check your insertion site every day for signs of infection. Check for: Redness, swelling, or pain. Bleeding from the site. Warmth. Pus or a bad smell. General instructions Take over-the-counter and prescription medicines only as told by your doctor. Rest as told by your doctor. Go back to your normal activities as told by your doctor. Keep all follow-up visits as told by your doctor. This is important. Contact a doctor if: You have itching or red, swollen areas of skin (hives). You feel worried or nervous (anxious). You feel weak after doing your normal activities. You have redness, swelling, warmth, or pain around the insertion site. You have blood coming from the insertion site, and the blood does not stop with pressure. You have pus or a bad smell coming from the insertion site. Get help right away if: You have signs of a serious reaction. This may be coming from an allergy or the body's defense system (immune system). Signs include: Trouble breathing or shortness of breath. Swelling of the face or feeling warm (flushed). Fever or chills. Head, chest, or back pain. Dark pee (urine) or blood in the pee. Widespread rash. Fast  heartbeat. Feeling dizzy or light-headed. You may receive your blood transfusion in an outpatient setting. If so, you will be told whom to contact to report any reactions. These symptoms may be an emergency. Do not wait to see if the symptoms will go away. Get medical help right away. Call your local emergency services (911 in the [place] Do not drive yourself to the hospital. Summary Bruising and soreness at the IV site are common. Check your insertion site every day for signs of infection. Rest as told by your doctor. Go back to your normal activities as told by your doctor. Get help right away if you have signs of a serious reaction. This information is not intended to replace advice given to you by your health care provider. Make sure you discuss any questions you have with your health care provider. Document Revised: 01/02/2021 Document Reviewed: 03/02/2019 Elsevier Patient Education  2022 Elsevier Inc.  

## 2021-07-28 ENCOUNTER — Other Ambulatory Visit (HOSPITAL_COMMUNITY): Payer: Self-pay

## 2021-07-28 LAB — TYPE AND SCREEN
ABO/RH(D): B POS
Antibody Screen: POSITIVE
DAT, IgG: NEGATIVE
Donor AG Type: NEGATIVE
Donor AG Type: NEGATIVE
Unit division: 0
Unit division: 0

## 2021-07-28 LAB — BPAM RBC
Blood Product Expiration Date: 202212042359
Blood Product Expiration Date: 202212062359
ISSUE DATE / TIME: 202211040918
ISSUE DATE / TIME: 202211040918
Unit Type and Rh: 5100
Unit Type and Rh: 7300

## 2021-07-31 ENCOUNTER — Other Ambulatory Visit (HOSPITAL_COMMUNITY): Payer: Self-pay

## 2021-08-05 NOTE — Progress Notes (Signed)
 Patient Care Team: Freeman, Courtney James, NP as PCP - General (Nurse Practitioner) Nahser, Philip J, MD as PCP - Cardiology (Cardiology)  DIAGNOSIS:    ICD-10-CM   1. Myelodysplastic syndrome (HCC)  D46.9       CHIEF COMPLIANT: Follow-up of myelodysplastic syndrome  INTERVAL HISTORY: Whitney T Pidcock is a 87 y.o. with above-mentioned history of myelodysplastic syndrome who last received a blood transfusions of 1 unit of PRBCs on 05/17/2021 and also receiving Aranesp. Labs on 07/23/2021 showed RBC 2.12, Hg 6.1, HCT 18.6, PLT 128, Iron sat 104%, Ferritin 4,967. He presents to the clinic today for follow-up.  He sleeps most of the time.  He does not take part in any activities at the nursing home.  ALLERGIES:  has No Known Allergies.  MEDICATIONS:  Current Outpatient Medications  Medication Sig Dispense Refill   cholecalciferol (VITAMIN D3) 25 MCG (1000 UNIT) tablet Take 1,000 Units by mouth daily.     cyanocobalamin 1000 MCG tablet Take 1,000 mcg by mouth daily.     deferasirox (EXJADE) 500 MG disintegrating tablet Take 2 tablets (1,000 mg total) by mouth daily before breakfast. 60 tablet 3   ELIQUIS 5 MG TABS tablet TAKE 1 TABLET BY MOUTH TWICE A DAY 180 tablet 1   Magnesium 250 MG TABS Take 1 tablet (250 mg total) by mouth daily.  0   simvastatin (ZOCOR) 20 MG tablet Take 20 mg by mouth daily.     traMADol (ULTRAM) 50 MG tablet Take 1 tablet (50 mg total) by mouth every 12 (twelve) hours as needed. 10 tablet 0   No current facility-administered medications for this visit.    PHYSICAL EXAMINATION: ECOG PERFORMANCE STATUS: 3 - Symptomatic, >50% confined to bed  Vitals:   08/06/21 0955  BP: (!) 121/59  Pulse: 92  Resp: 18  Temp: 98.1 F (36.7 C)  SpO2: 100%   Filed Weights   08/06/21 0955  Weight: 176 lb 3.2 oz (79.9 kg)    LABORATORY DATA:  I have reviewed the data as listed CMP Latest Ref Rng & Units 12/26/2020 12/13/2020 12/12/2020  Glucose 70 - 99 mg/dL 129(H)  110(H) 125(H)  BUN 8 - 23 mg/dL 36(H) 23 27(H)  Creatinine 0.61 - 1.24 mg/dL 1.16 1.12 1.20  Sodium 135 - 145 mmol/L 135 130(L) 133(L)  Potassium 3.5 - 5.1 mmol/L 4.2 4.1 4.0  Chloride 98 - 111 mmol/L 92(L) 97(L) 95(L)  CO2 22 - 32 mmol/L 31 25 -  Calcium 8.9 - 10.3 mg/dL 8.4(L) 7.9(L) -  Total Protein 6.5 - 8.1 g/dL 9.0(H) 7.1 -  Total Bilirubin 0.3 - 1.2 mg/dL 0.9 1.5(H) -  Alkaline Phos 38 - 126 U/L 200(H) 85 -  AST 15 - 41 U/L 22 26 -  ALT 0 - 44 U/L 57(H) 47(H) -    Lab Results  Component Value Date   WBC 7.2 08/06/2021   HGB 7.5 (L) 08/06/2021   HCT 22.8 (L) 08/06/2021   MCV 88.4 08/06/2021   PLT 143 (L) 08/06/2021   NEUTROABS 3.1 08/06/2021    ASSESSMENT & PLAN:  Myelodysplastic syndrome (HCC) Treatment plan:  Aranesp every 3 weeks 500 mcg (increased from 300 mcg) and supportive care with blood transfusions   Lab review: 01/29/2020: Hemoglobin 7.8, MCV 103.4, platelets 149 03/28/2020: Hemoglobin is 9 (it was 6.9 on 03/22/2020) 05/23/2020: Hemoglobin 7.1 (received blood transfusion)  07/05/2020: Hemoglobin 6.2 (received blood transfusion) 07/26/2020: Hemoglobin 7.3 (received blood transfusion) 03/06/2021: Hemoglobin 5.4 (2 units PRBC) 04/30/2021: Hemoglobin 6.9 (  2 units PRBC 05/28/2021: Hemoglobin 7.3 08/06/2021: Hemoglobin 7.5   Bone marrow biopsy: Hypercellular bone marrow for age 40 to 60% cellularity with dyspoietic changes associated with abundant ring sideroblasts.  No increase in blastic cells identified.  Findings favor MDS with ringed sideroblasts Cytogenetics and FISH: SF3B1 mutation detected (present in 80% of MDS with ringed sideroblasts) no other mutations  Treatment plan: blood transfusions and supportive care If hemoglobin is 7.5 or less: 1 unit If hemoglobin is 6.9 or less: 2 units   Elevated ferritin: Iron overload from frequent blood transfusions: on Exjade. 1000 mg a day  Hospitalization: 12/12/2020-12/17/2020: CT showing large necrotic left lower lobe lung  mass, severe sepsis Bronchoscopy, EBUS 12/16/2020: Severe inflammation, no malignancy  Return to clinic every 2 weeks for blood transfusions I will see the patient back in 2 months      No orders of the defined types were placed in this encounter.  The patient has a good understanding of the overall plan. he agrees with it. he will call with any problems that may develop before the next visit here.  Total time spent: 20 mins including face to face time and time spent for planning, charting and coordination of care   K , MD, MPH 08/06/2021  I, Kirstyn Evans, am acting as scribe for Dr.  .  I have reviewed the above documentation for accuracy and completeness, and I agree with the above.       

## 2021-08-06 ENCOUNTER — Inpatient Hospital Stay: Payer: Medicare Other

## 2021-08-06 ENCOUNTER — Inpatient Hospital Stay (HOSPITAL_BASED_OUTPATIENT_CLINIC_OR_DEPARTMENT_OTHER): Payer: Medicare Other | Admitting: Hematology and Oncology

## 2021-08-06 ENCOUNTER — Other Ambulatory Visit: Payer: Self-pay

## 2021-08-06 ENCOUNTER — Other Ambulatory Visit: Payer: Self-pay | Admitting: *Deleted

## 2021-08-06 DIAGNOSIS — D469 Myelodysplastic syndrome, unspecified: Secondary | ICD-10-CM

## 2021-08-06 DIAGNOSIS — D696 Thrombocytopenia, unspecified: Secondary | ICD-10-CM

## 2021-08-06 DIAGNOSIS — D46A Refractory cytopenia with multilineage dysplasia: Secondary | ICD-10-CM | POA: Diagnosis not present

## 2021-08-06 LAB — IRON AND TIBC
Iron: 168 ug/dL — ABNORMAL HIGH (ref 42–163)
Saturation Ratios: 103 % — ABNORMAL HIGH (ref 20–55)
TIBC: 163 ug/dL — ABNORMAL LOW (ref 202–409)
UIBC: UNDETERMINED ug/dL (ref 117–376)

## 2021-08-06 LAB — CBC WITH DIFFERENTIAL (CANCER CENTER ONLY)
Abs Immature Granulocytes: 0.03 10*3/uL (ref 0.00–0.07)
Basophils Absolute: 0 10*3/uL (ref 0.0–0.1)
Basophils Relative: 0 %
Eosinophils Absolute: 0.2 10*3/uL (ref 0.0–0.5)
Eosinophils Relative: 2 %
HCT: 22.8 % — ABNORMAL LOW (ref 39.0–52.0)
Hemoglobin: 7.5 g/dL — ABNORMAL LOW (ref 13.0–17.0)
Immature Granulocytes: 0 %
Lymphocytes Relative: 17 %
Lymphs Abs: 1.2 10*3/uL (ref 0.7–4.0)
MCH: 29.1 pg (ref 26.0–34.0)
MCHC: 32.9 g/dL (ref 30.0–36.0)
MCV: 88.4 fL (ref 80.0–100.0)
Monocytes Absolute: 2.7 10*3/uL — ABNORMAL HIGH (ref 0.1–1.0)
Monocytes Relative: 37 %
Neutro Abs: 3.1 10*3/uL (ref 1.7–7.7)
Neutrophils Relative %: 44 %
Platelet Count: 143 10*3/uL — ABNORMAL LOW (ref 150–400)
RBC: 2.58 MIL/uL — ABNORMAL LOW (ref 4.22–5.81)
RDW: 16 % — ABNORMAL HIGH (ref 11.5–15.5)
WBC Count: 7.2 10*3/uL (ref 4.0–10.5)
nRBC: 0 % (ref 0.0–0.2)

## 2021-08-06 LAB — SAMPLE TO BLOOD BANK

## 2021-08-06 LAB — FERRITIN: Ferritin: 5127 ng/mL — ABNORMAL HIGH (ref 24–336)

## 2021-08-06 NOTE — Assessment & Plan Note (Signed)
Treatment plan:Aranesp every 3 weeks 500 mcg (increased from 300 mcg)and supportive care with blood transfusions  Lab review: 01/29/2020: Hemoglobin 7.8, MCV 103.4, platelets 149 03/28/2020: Hemoglobin is 9 (it was 6.9 on 03/22/2020) 05/23/2020: Hemoglobin 7.1(received blood transfusion) 07/05/2020: Hemoglobin 6.2 (received blood transfusion) 07/26/2020: Hemoglobin 7.3 (received blood transfusion) 03/06/2021: Hemoglobin 5.4 (2 units PRBC) 04/30/2021: Hemoglobin 6.9 (2 units PRBC 05/28/2021: Hemoglobin 7.3 08/06/2021:  Bone marrow biopsy: Hypercellular bone marrow for age 58 to 60% cellularity with dyspoietic changes associated with abundant ring sideroblasts. No increase in blastic cells identified. 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 1000 mg a day Hospitalization: 12/12/2020-12/17/2020: CT showing large necrotic left lower lobe lung mass, severe sepsis Bronchoscopy, EBUS 12/16/2020: Severe inflammation, no malignancy  Return to clinic every 2 weeks for blood transfusions I will see the patient back in 2 months

## 2021-08-06 NOTE — Progress Notes (Signed)
Pt Hgb 7.5  per MD pt needing to receive 1 unit PRBC's this week.  Orders placed.

## 2021-08-07 ENCOUNTER — Other Ambulatory Visit (HOSPITAL_COMMUNITY): Payer: Self-pay

## 2021-08-08 ENCOUNTER — Other Ambulatory Visit: Payer: Self-pay

## 2021-08-08 ENCOUNTER — Inpatient Hospital Stay: Payer: Medicare Other

## 2021-08-08 DIAGNOSIS — D46A Refractory cytopenia with multilineage dysplasia: Secondary | ICD-10-CM | POA: Diagnosis not present

## 2021-08-08 DIAGNOSIS — D696 Thrombocytopenia, unspecified: Secondary | ICD-10-CM

## 2021-08-08 LAB — PREPARE RBC (CROSSMATCH)

## 2021-08-08 MED ORDER — DIPHENHYDRAMINE HCL 25 MG PO CAPS
25.0000 mg | ORAL_CAPSULE | Freq: Once | ORAL | Status: AC
  Administered 2021-08-08: 25 mg via ORAL
  Filled 2021-08-08: qty 1

## 2021-08-08 MED ORDER — SODIUM CHLORIDE 0.9% IV SOLUTION
250.0000 mL | Freq: Once | INTRAVENOUS | Status: AC
  Administered 2021-08-08: 250 mL via INTRAVENOUS

## 2021-08-08 MED ORDER — ACETAMINOPHEN 325 MG PO TABS
650.0000 mg | ORAL_TABLET | Freq: Once | ORAL | Status: AC
  Administered 2021-08-08: 650 mg via ORAL
  Filled 2021-08-08: qty 2

## 2021-08-08 NOTE — Patient Instructions (Signed)

## 2021-08-10 LAB — TYPE AND SCREEN
ABO/RH(D): B POS
Antibody Screen: POSITIVE
DAT, IgG: NEGATIVE
Donor AG Type: NEGATIVE
Donor AG Type: NEGATIVE
Unit division: 0
Unit division: 0

## 2021-08-10 LAB — BPAM RBC
Blood Product Expiration Date: 202212232359
Blood Product Expiration Date: 202212252359
ISSUE DATE / TIME: 202211180939
Unit Type and Rh: 5100
Unit Type and Rh: 7300

## 2021-08-13 ENCOUNTER — Other Ambulatory Visit (HOSPITAL_COMMUNITY): Payer: Self-pay

## 2021-08-18 ENCOUNTER — Other Ambulatory Visit (HOSPITAL_COMMUNITY): Payer: Self-pay

## 2021-08-20 ENCOUNTER — Other Ambulatory Visit: Payer: Self-pay

## 2021-08-20 ENCOUNTER — Inpatient Hospital Stay: Payer: Medicare Other

## 2021-08-20 DIAGNOSIS — D46A Refractory cytopenia with multilineage dysplasia: Secondary | ICD-10-CM | POA: Diagnosis not present

## 2021-08-20 DIAGNOSIS — D469 Myelodysplastic syndrome, unspecified: Secondary | ICD-10-CM

## 2021-08-20 LAB — CBC WITH DIFFERENTIAL (CANCER CENTER ONLY)
Abs Immature Granulocytes: 0.03 10*3/uL (ref 0.00–0.07)
Basophils Absolute: 0 10*3/uL (ref 0.0–0.1)
Basophils Relative: 0 %
Eosinophils Absolute: 0.2 10*3/uL (ref 0.0–0.5)
Eosinophils Relative: 3 %
HCT: 21.4 % — ABNORMAL LOW (ref 39.0–52.0)
Hemoglobin: 7 g/dL — ABNORMAL LOW (ref 13.0–17.0)
Immature Granulocytes: 1 %
Lymphocytes Relative: 22 %
Lymphs Abs: 1.4 10*3/uL (ref 0.7–4.0)
MCH: 29.8 pg (ref 26.0–34.0)
MCHC: 32.7 g/dL (ref 30.0–36.0)
MCV: 91.1 fL (ref 80.0–100.0)
Monocytes Absolute: 2.3 10*3/uL — ABNORMAL HIGH (ref 0.1–1.0)
Monocytes Relative: 37 %
Neutro Abs: 2.2 10*3/uL (ref 1.7–7.7)
Neutrophils Relative %: 37 %
Platelet Count: 132 10*3/uL — ABNORMAL LOW (ref 150–400)
RBC: 2.35 MIL/uL — ABNORMAL LOW (ref 4.22–5.81)
RDW: 17.2 % — ABNORMAL HIGH (ref 11.5–15.5)
WBC Count: 6.1 10*3/uL (ref 4.0–10.5)
nRBC: 0 % (ref 0.0–0.2)

## 2021-08-20 LAB — SAMPLE TO BLOOD BANK

## 2021-08-21 ENCOUNTER — Other Ambulatory Visit: Payer: Self-pay | Admitting: *Deleted

## 2021-08-21 DIAGNOSIS — D696 Thrombocytopenia, unspecified: Secondary | ICD-10-CM

## 2021-08-21 LAB — PREPARE RBC (CROSSMATCH)

## 2021-08-21 LAB — FERRITIN: Ferritin: 5680 ng/mL — ABNORMAL HIGH (ref 24–336)

## 2021-08-21 LAB — IRON AND TIBC
Iron: 163 ug/dL (ref 42–163)
Saturation Ratios: 103 % — ABNORMAL HIGH (ref 20–55)
TIBC: 159 ug/dL — ABNORMAL LOW (ref 202–409)
UIBC: UNDETERMINED ug/dL (ref 117–376)

## 2021-08-21 NOTE — Progress Notes (Signed)
Pt Hgb 7.0.  Verbal orders received from MD for pt to receive 1 unit PRBC's.  Orders placed, BB notified, and pt educated/verbalized understanding of appt date and time.

## 2021-08-22 ENCOUNTER — Inpatient Hospital Stay: Payer: Medicare Other | Attending: Hematology and Oncology

## 2021-08-22 ENCOUNTER — Other Ambulatory Visit: Payer: Self-pay

## 2021-08-22 ENCOUNTER — Inpatient Hospital Stay: Payer: Medicare Other

## 2021-08-22 DIAGNOSIS — D469 Myelodysplastic syndrome, unspecified: Secondary | ICD-10-CM

## 2021-08-22 DIAGNOSIS — D696 Thrombocytopenia, unspecified: Secondary | ICD-10-CM

## 2021-08-22 DIAGNOSIS — D46A Refractory cytopenia with multilineage dysplasia: Secondary | ICD-10-CM | POA: Insufficient documentation

## 2021-08-22 LAB — SAMPLE TO BLOOD BANK

## 2021-08-22 MED ORDER — DIPHENHYDRAMINE HCL 25 MG PO CAPS
25.0000 mg | ORAL_CAPSULE | Freq: Once | ORAL | Status: AC
  Administered 2021-08-22: 25 mg via ORAL
  Filled 2021-08-22: qty 1

## 2021-08-22 MED ORDER — ACETAMINOPHEN 325 MG PO TABS
650.0000 mg | ORAL_TABLET | Freq: Once | ORAL | Status: AC
  Administered 2021-08-22: 650 mg via ORAL
  Filled 2021-08-22: qty 2

## 2021-08-22 MED ORDER — SODIUM CHLORIDE 0.9% IV SOLUTION
250.0000 mL | Freq: Once | INTRAVENOUS | Status: AC
  Administered 2021-08-22: 250 mL via INTRAVENOUS

## 2021-08-22 NOTE — Patient Instructions (Signed)

## 2021-08-22 NOTE — Progress Notes (Signed)
Blood bank confirms they have matched unit ready for pt. Blood bank requesting extra pink tubes to send out to ambulatory labs for testing.

## 2021-08-25 LAB — TYPE AND SCREEN
ABO/RH(D): B POS
Antibody Screen: POSITIVE
DAT, IgG: POSITIVE
Donor AG Type: NEGATIVE
Unit division: 0

## 2021-08-25 LAB — BPAM RBC
Blood Product Expiration Date: 202212192359
ISSUE DATE / TIME: 202212020940
Unit Type and Rh: 9500

## 2021-08-25 NOTE — Progress Notes (Signed)
Refractory cytopenia with multilineage dysplasia

## 2021-08-29 ENCOUNTER — Other Ambulatory Visit: Payer: Self-pay

## 2021-08-29 ENCOUNTER — Other Ambulatory Visit: Payer: Medicare Other | Admitting: Hospice

## 2021-08-29 DIAGNOSIS — D4620 Refractory anemia with excess of blasts, unspecified: Secondary | ICD-10-CM

## 2021-08-29 DIAGNOSIS — R531 Weakness: Secondary | ICD-10-CM

## 2021-08-29 DIAGNOSIS — D638 Anemia in other chronic diseases classified elsewhere: Secondary | ICD-10-CM

## 2021-08-29 DIAGNOSIS — Z515 Encounter for palliative care: Secondary | ICD-10-CM

## 2021-08-29 NOTE — Progress Notes (Signed)
[facility] Consult Note Telephone: [phone]  Fax: [phone]  PATIENT NAME: Ricky [name] DOB: Apr 19, 1931 MRN: [id number]  PRIMARY CARE PROVIDER:   Roetta Sessions, NP Roetta Sessions, NP [address] [place],  [place] [address]  REFERRING PROVIDER: Roetta Sessions, NP Roetta Sessions, NP [address] [place],  [place] [address]  RESPONSIBLE PARTY:  Erdem Naas Jnr [phone]  Extended Emergency Contact Information Primary Emergency Contact: Buskirk,Anwar Home Phone: [phone] Mobile Phone: [phone] Relation: Son  Patient: [phone] Spouse: Pennelope Bracken Information     Name Relation Home Work [place] Son [phone]  [phone]   EUFEMIO, STRAHM Spouse [phone]  [phone]   ZAYDYN, HAVEY Other   [phone]       Visit is to build trust and highlight Palliative Medicine as specialized medical care for people living with serious illness, aimed at facilitating better quality of life through symptoms relief, assisting with advance care planning and complex medical decision making. NP called son Ricky [name] and left him a voicemail with call back number. This is a follow up visit.  RECOMMENDATIONS/PLAN:   Advance Care Planning/Code Status: Patient is a Do Not Resuscitate  Goals of Care: Goals of care include to maximize quality of life and symptom management.  Palliative care team will continue to support patient, patient's family, and medical team.  Symptom management/Plan:  Myelodysplastic syndrome, with associated anemia: Continue scheduled treatment at the [facility] - PRBCs and Aranesp  Weakness: Likely related to anemia. Physical therapy is ongoing for strengthening and gait training/self care. Fall precautions discussed.  Afib: Managed with Eliquis. Follow up: Palliative care will continue to follow  for complex medical decision making, advance care planning, and clarification of goals. Return 6 weeks or prn. Encouraged to call provider sooner with any concerns.   CHIEF COMPLAINT: Palliative follow up visit/weakness   HISTORY OF PRESENT ILLNESS:  Ricky [name] a 87 y.o. male with multiple medical problems including  low-grade myelodysplastic syndrome, for which patient continues to go to the [facility] center for infusion of PRBCs, and receives Aranesp. Chart review of last visit with Dr Lindi Adie at the Cancer center indicates Labs on 07/23/2021 RBC 2.12, Hg 6.1, HCT 18.6, PLT 128, Iron sat 104%, Ferritin 4,967.  Repeat hemoglobin showed improvement 08/20/21 is 7.0. Patient tires easily and sleeps a lot during the day. He continues with PT/OT. History of  anemia of chronic disease, chronic fatigue, paroxysmal A. Fib, CAD, Chronic diastolic CHF.  Patient denied pain/discomfort; endorsed chronic fatigue and reported physical therapy and daily exercises are helpful. He endorsed cough which he said is getting better with cough medicine- Mucinex. Spouse confirmed patient is currently taking Mucinex DM. They know to report if no improvement or if worsening. Patient is afebrile, denies chest pain or malaise. History obtained from review of EMR, discussion with primary team, family and/or patient. Records reviewed and summarized above. All 10 point systems reviewed and are negative except as documented in history of present illness above  Review and summarization of Epic records shows history from other than patient.   Palliative Care was asked to follow this patient o help address complex decision making in the context of advance care planning and goals of care clarification.   PHYSICAL EXAM  BO 114/78 P70 R18 02 95% RA T98F General: In no acute distress, appropriately dressed Cardiovascular: regular rate and rhythm; no  edema in BLE Pulmonary: occasional cough, no increased work of breathing, normal  respiratory effort, no adventitious lung sounds. Abdomen: soft, non tender, no guarding, positive bowel sounds in all quadrants GU:  no suprapubic tenderness Eyes: Normal lids, no discharge ENMT: Moist mucous membranes Musculoskeletal:  weakness, ambulatory with rolling walker Skin: no rash to visible skin, warm without cyanosis,  Psych: non-anxious affect Neurological: Weakness but otherwise non focal Heme/lymph/immuno: no bruises, no bleeding  PERTINENT MEDICATIONS:  Outpatient Encounter Medications as of 08/29/2021  Medication Sig   cholecalciferol (VITAMIN D3) 25 MCG (1000 UNIT) tablet Take 1,000 Units by mouth daily.   cyanocobalamin 1000 MCG tablet Take 1,000 mcg by mouth daily.   deferasirox (EXJADE) 500 MG disintegrating tablet Take 2 tablets (1,000 mg total) by mouth daily before breakfast.   ELIQUIS 5 MG TABS tablet TAKE 1 TABLET BY MOUTH TWICE A DAY   Magnesium 250 MG TABS Take 1 tablet (250 mg total) by mouth daily.   simvastatin (ZOCOR) 20 MG tablet Take 20 mg by mouth daily.   traMADol (ULTRAM) 50 MG tablet Take 1 tablet (50 mg total) by mouth every 12 (twelve) hours as needed.   No facility-administered encounter medications on file as of 08/29/2021.    HOSPICE ELIGIBILITY/DIAGNOSIS: TBD  PAST MEDICAL HISTORY:  Past Medical History:  Diagnosis Date   A-fib ([place])    Anemia    Anemia of chronic disease 02/02/2020   Atrial fibrillation (HCC)    Boerhaave's syndrome    Chronic anticoagulation 01/22/2020   Colon polyps    COVID-19    Hx of colonic polyps 01/22/2020   2017 - [place]   Hypomagnesemia    Low grade myelodysplastic syndrome lesions ([place]) 02/02/2020   Lower urinary tract symptoms due to benign prostatic hyperplasia    Pacemaker    Paroxysmal atrial fibrillation (HCC)    Prostate enlargement    Symptomatic anemia 12/26/2019   Vitamin D deficiency      Review lab tests/diagnostics ALLERGIES: No Known Allergies    I spent 40 minutes providing this  consultation; this includes time spent with patient/family, chart review and documentation. More than 50% of the time in this consultation was spent on counseling and coordinating communication   Thank you for the opportunity to participate in the care of Ricky [name] Please call our office at [phone] if we can be of additional assistance.  Note: Portions of this note were generated with [employer]. Dictation errors may occur despite best attempts at proofreading.  Teodoro Spray, NP

## 2021-09-03 ENCOUNTER — Inpatient Hospital Stay: Payer: Medicare Other

## 2021-09-03 ENCOUNTER — Other Ambulatory Visit: Payer: Self-pay

## 2021-09-03 DIAGNOSIS — D469 Myelodysplastic syndrome, unspecified: Secondary | ICD-10-CM

## 2021-09-03 DIAGNOSIS — D46A Refractory cytopenia with multilineage dysplasia: Secondary | ICD-10-CM | POA: Diagnosis not present

## 2021-09-03 LAB — IRON AND TIBC
Iron: 173 ug/dL — ABNORMAL HIGH (ref 42–163)
Saturation Ratios: 99 % — ABNORMAL HIGH (ref 20–55)
TIBC: 174 ug/dL — ABNORMAL LOW (ref 202–409)
UIBC: 1 ug/dL — ABNORMAL LOW (ref 117–376)

## 2021-09-03 LAB — CBC WITH DIFFERENTIAL (CANCER CENTER ONLY)
Abs Immature Granulocytes: 0.02 10*3/uL (ref 0.00–0.07)
Basophils Absolute: 0 10*3/uL (ref 0.0–0.1)
Basophils Relative: 0 %
Eosinophils Absolute: 0.1 10*3/uL (ref 0.0–0.5)
Eosinophils Relative: 2 %
HCT: 19.7 % — ABNORMAL LOW (ref 39.0–52.0)
Hemoglobin: 6.5 g/dL — CL (ref 13.0–17.0)
Immature Granulocytes: 0 %
Lymphocytes Relative: 26 %
Lymphs Abs: 1.2 10*3/uL (ref 0.7–4.0)
MCH: 30.1 pg (ref 26.0–34.0)
MCHC: 33 g/dL (ref 30.0–36.0)
MCV: 91.2 fL (ref 80.0–100.0)
Monocytes Absolute: 1.9 10*3/uL — ABNORMAL HIGH (ref 0.1–1.0)
Monocytes Relative: 41 %
Neutro Abs: 1.4 10*3/uL — ABNORMAL LOW (ref 1.7–7.7)
Neutrophils Relative %: 31 %
Platelet Count: 67 10*3/uL — ABNORMAL LOW (ref 150–400)
RBC: 2.16 MIL/uL — ABNORMAL LOW (ref 4.22–5.81)
RDW: 17.3 % — ABNORMAL HIGH (ref 11.5–15.5)
Smear Review: ADEQUATE
WBC Count: 4.7 10*3/uL (ref 4.0–10.5)
nRBC: 0 % (ref 0.0–0.2)

## 2021-09-03 LAB — FERRITIN: Ferritin: 4890 ng/mL — ABNORMAL HIGH (ref 24–336)

## 2021-09-03 NOTE — Progress Notes (Signed)
CRITICAL VALUE STICKER  CRITICAL VALUE: Hgb 6.6  RECEIVER (on-site recipient of call): Mickel Baas, [place] NOTIFIED: 09/03/21 1500  MESSENGER (representative from lab): Lauren  MD NOTIFIED: Lindi Adie  TIME OF NOTIFICATION: 1500  RESPONSE: Pt is scheduled for transfusion 12/16. OK to wait.

## 2021-09-04 ENCOUNTER — Other Ambulatory Visit: Payer: Self-pay

## 2021-09-04 DIAGNOSIS — D649 Anemia, unspecified: Secondary | ICD-10-CM

## 2021-09-04 DIAGNOSIS — D46A Refractory cytopenia with multilineage dysplasia: Secondary | ICD-10-CM | POA: Diagnosis not present

## 2021-09-04 DIAGNOSIS — D696 Thrombocytopenia, unspecified: Secondary | ICD-10-CM

## 2021-09-04 LAB — SAMPLE TO BLOOD BANK

## 2021-09-04 NOTE — Progress Notes (Signed)
Orders for 2 units of PRBCs entered for 12/16 transfusion. Appropriate orders released and verified with BB.

## 2021-09-05 ENCOUNTER — Other Ambulatory Visit: Payer: Self-pay

## 2021-09-05 ENCOUNTER — Inpatient Hospital Stay: Payer: Medicare Other

## 2021-09-05 DIAGNOSIS — D649 Anemia, unspecified: Secondary | ICD-10-CM

## 2021-09-05 DIAGNOSIS — D46A Refractory cytopenia with multilineage dysplasia: Secondary | ICD-10-CM | POA: Diagnosis not present

## 2021-09-05 LAB — PREPARE RBC (CROSSMATCH)

## 2021-09-05 MED ORDER — ACETAMINOPHEN 325 MG PO TABS
650.0000 mg | ORAL_TABLET | Freq: Once | ORAL | Status: AC
  Administered 2021-09-05: 650 mg via ORAL
  Filled 2021-09-05: qty 2

## 2021-09-05 MED ORDER — DIPHENHYDRAMINE HCL 25 MG PO CAPS
25.0000 mg | ORAL_CAPSULE | Freq: Once | ORAL | Status: AC
  Administered 2021-09-05: 25 mg via ORAL
  Filled 2021-09-05: qty 1

## 2021-09-05 MED ORDER — SODIUM CHLORIDE 0.9% IV SOLUTION
250.0000 mL | Freq: Once | INTRAVENOUS | Status: AC
  Administered 2021-09-05: 250 mL via INTRAVENOUS

## 2021-09-05 NOTE — Patient Instructions (Signed)

## 2021-09-08 LAB — TYPE AND SCREEN
ABO/RH(D): B POS
Antibody Screen: POSITIVE
DAT, IgG: NEGATIVE
Donor AG Type: NEGATIVE
Donor AG Type: NEGATIVE
Unit division: 0
Unit division: 0

## 2021-09-08 LAB — BPAM RBC
Blood Product Expiration Date: 202301142359
Blood Product Expiration Date: 202301242359
ISSUE DATE / TIME: 202212160931
ISSUE DATE / TIME: 202212160931
Unit Type and Rh: 7300
Unit Type and Rh: 7300

## 2021-09-16 ENCOUNTER — Other Ambulatory Visit: Payer: Self-pay | Admitting: *Deleted

## 2021-09-16 DIAGNOSIS — D638 Anemia in other chronic diseases classified elsewhere: Secondary | ICD-10-CM

## 2021-09-16 DIAGNOSIS — D696 Thrombocytopenia, unspecified: Secondary | ICD-10-CM

## 2021-09-17 ENCOUNTER — Inpatient Hospital Stay: Payer: Medicare Other

## 2021-09-17 ENCOUNTER — Other Ambulatory Visit: Payer: Self-pay

## 2021-09-17 DIAGNOSIS — D638 Anemia in other chronic diseases classified elsewhere: Secondary | ICD-10-CM

## 2021-09-17 DIAGNOSIS — D46A Refractory cytopenia with multilineage dysplasia: Secondary | ICD-10-CM | POA: Diagnosis not present

## 2021-09-17 DIAGNOSIS — D696 Thrombocytopenia, unspecified: Secondary | ICD-10-CM

## 2021-09-17 LAB — CBC WITH DIFFERENTIAL (CANCER CENTER ONLY)
Abs Immature Granulocytes: 0.01 10*3/uL (ref 0.00–0.07)
Basophils Absolute: 0 10*3/uL (ref 0.0–0.1)
Basophils Relative: 0 %
Eosinophils Absolute: 0.1 10*3/uL (ref 0.0–0.5)
Eosinophils Relative: 3 %
HCT: 23 % — ABNORMAL LOW (ref 39.0–52.0)
Hemoglobin: 7.3 g/dL — ABNORMAL LOW (ref 13.0–17.0)
Immature Granulocytes: 0 %
Lymphocytes Relative: 28 %
Lymphs Abs: 1.3 10*3/uL (ref 0.7–4.0)
MCH: 28.1 pg (ref 26.0–34.0)
MCHC: 31.7 g/dL (ref 30.0–36.0)
MCV: 88.5 fL (ref 80.0–100.0)
Monocytes Absolute: 2 10*3/uL — ABNORMAL HIGH (ref 0.1–1.0)
Monocytes Relative: 42 %
Neutro Abs: 1.2 10*3/uL — ABNORMAL LOW (ref 1.7–7.7)
Neutrophils Relative %: 27 %
Platelet Count: 123 10*3/uL — ABNORMAL LOW (ref 150–400)
RBC: 2.6 MIL/uL — ABNORMAL LOW (ref 4.22–5.81)
RDW: 16.4 % — ABNORMAL HIGH (ref 11.5–15.5)
WBC Count: 4.7 10*3/uL (ref 4.0–10.5)
nRBC: 0 % (ref 0.0–0.2)

## 2021-09-17 LAB — CMP (CANCER CENTER ONLY)
ALT: 55 U/L — ABNORMAL HIGH (ref 0–44)
AST: 25 U/L (ref 15–41)
Albumin: 3.6 g/dL (ref 3.5–5.0)
Alkaline Phosphatase: 72 U/L (ref 38–126)
Anion gap: 5 (ref 5–15)
BUN: 23 mg/dL (ref 8–23)
CO2: 26 mmol/L (ref 22–32)
Calcium: 8.4 mg/dL — ABNORMAL LOW (ref 8.9–10.3)
Chloride: 103 mmol/L (ref 98–111)
Creatinine: 0.78 mg/dL (ref 0.61–1.24)
GFR, Estimated: >60 mL/min (ref >60)
Glucose, Bld: 103 mg/dL — ABNORMAL HIGH (ref 70–99)
Potassium: 4.4 mmol/L (ref 3.5–5.1)
Sodium: 134 mmol/L — ABNORMAL LOW (ref 135–145)
Total Bilirubin: 0.8 mg/dL (ref 0.3–1.2)
Total Protein: 7.7 g/dL (ref 6.5–8.1)

## 2021-09-17 LAB — SAMPLE TO BLOOD BANK

## 2021-09-17 LAB — FERRITIN: Ferritin: 4860 ng/mL — ABNORMAL HIGH (ref 24–336)

## 2021-09-18 ENCOUNTER — Other Ambulatory Visit: Payer: Self-pay | Admitting: *Deleted

## 2021-09-18 ENCOUNTER — Telehealth: Payer: Self-pay | Admitting: *Deleted

## 2021-09-18 DIAGNOSIS — D46A Refractory cytopenia with multilineage dysplasia: Secondary | ICD-10-CM | POA: Diagnosis not present

## 2021-09-18 DIAGNOSIS — D649 Anemia, unspecified: Secondary | ICD-10-CM

## 2021-09-18 LAB — IRON AND IRON BINDING CAPACITY (CC-WL,HP ONLY)
Iron: 179 ug/dL (ref 45–182)
Saturation Ratios: 98 % — ABNORMAL HIGH (ref 17.9–39.5)
TIBC: 182 ug/dL — ABNORMAL LOW (ref 250–450)
UIBC: 3 ug/dL — ABNORMAL LOW (ref 117–376)

## 2021-09-18 NOTE — Telephone Encounter (Signed)
Blood orders entered per noted heme - pt scheduled for transfusion tomorrow.

## 2021-09-19 ENCOUNTER — Other Ambulatory Visit: Payer: Self-pay

## 2021-09-19 ENCOUNTER — Inpatient Hospital Stay: Payer: Medicare Other

## 2021-09-19 DIAGNOSIS — D46A Refractory cytopenia with multilineage dysplasia: Secondary | ICD-10-CM | POA: Diagnosis not present

## 2021-09-19 DIAGNOSIS — D649 Anemia, unspecified: Secondary | ICD-10-CM

## 2021-09-19 MED ORDER — SODIUM CHLORIDE 0.9% FLUSH: 3.0000 mL | INTRAVENOUS | Status: DC | PRN

## 2021-09-19 MED ORDER — ACETAMINOPHEN 325 MG PO TABS
650.0000 mg | ORAL_TABLET | Freq: Once | ORAL | Status: AC
  Administered 2021-09-19: 09:00:00 650 mg via ORAL
  Filled 2021-09-19: qty 2

## 2021-09-19 MED ORDER — DIPHENHYDRAMINE HCL 25 MG PO CAPS
25.0000 mg | ORAL_CAPSULE | Freq: Once | ORAL | Status: AC
  Administered 2021-09-19: 09:00:00 25 mg via ORAL
  Filled 2021-09-19: qty 1

## 2021-09-19 NOTE — Patient Instructions (Signed)
Blood Transfusion, Adult, Care After This sheet gives you information about how to care for yourself after your procedure. Your doctor may also give you more specific instructions. If you have problems or questions, contact your doctor. What can I expect after the procedure? After the procedure, it is common to have: Bruising and soreness at the IV site. A headache. Follow these instructions at home: Insertion site care   Follow instructions from your doctor about how to take care of your insertion site. This is where an IV tube was put into your vein. Make sure you: Wash your hands with soap and water before and after you change your bandage (dressing). If you cannot use soap and water, use hand sanitizer. Change your bandage as told by your doctor. Check your insertion site every day for signs of infection. Check for: Redness, swelling, or pain. Bleeding from the site. Warmth. Pus or a bad smell. General instructions Take over-the-counter and prescription medicines only as told by your doctor. Rest as told by your doctor. Go back to your normal activities as told by your doctor. Keep all follow-up visits as told by your doctor. This is important. Contact a doctor if: You have itching or red, swollen areas of skin (hives). You feel worried or nervous (anxious). You feel weak after doing your normal activities. You have redness, swelling, warmth, or pain around the insertion site. You have blood coming from the insertion site, and the blood does not stop with pressure. You have pus or a bad smell coming from the insertion site. Get help right away if: You have signs of a serious reaction. This may be coming from an allergy or the body's defense system (immune system). Signs include: Trouble breathing or shortness of breath. Swelling of the face or feeling warm (flushed). Fever or chills. Head, chest, or back pain. Dark pee (urine) or blood in the pee. Widespread rash. Fast  heartbeat. Feeling dizzy or light-headed. You may receive your blood transfusion in an outpatient setting. If so, you will be told whom to contact to report any reactions. These symptoms may be an emergency. Do not wait to see if the symptoms will go away. Get medical help right away. Call your local emergency services (911 in the [place] Do not drive yourself to the hospital. Summary Bruising and soreness at the IV site are common. Check your insertion site every day for signs of infection. Rest as told by your doctor. Go back to your normal activities as told by your doctor. Get help right away if you have signs of a serious reaction. This information is not intended to replace advice given to you by your health care provider. Make sure you discuss any questions you have with your health care provider. Document Revised: 01/02/2021 Document Reviewed: 03/02/2019 Elsevier Patient Education  2022 Elsevier Inc.  

## 2021-09-21 LAB — BPAM RBC
Blood Product Expiration Date: 202301302359
Blood Product Expiration Date: 202302022359
ISSUE DATE / TIME: 202212300856
ISSUE DATE / TIME: 202212300856
Unit Type and Rh: 7300
Unit Type and Rh: 7300

## 2021-09-21 LAB — TYPE AND SCREEN
ABO/RH(D): B POS
Antibody Screen: POSITIVE
DAT, IgG: NEGATIVE
Donor AG Type: NEGATIVE
Donor AG Type: NEGATIVE
Unit division: 0
Unit division: 0

## 2021-09-30 ENCOUNTER — Other Ambulatory Visit (HOSPITAL_COMMUNITY): Payer: Self-pay

## 2021-09-30 ENCOUNTER — Other Ambulatory Visit: Payer: Self-pay

## 2021-09-30 DIAGNOSIS — D469 Myelodysplastic syndrome, unspecified: Secondary | ICD-10-CM

## 2021-10-01 ENCOUNTER — Encounter: Payer: Self-pay | Admitting: *Deleted

## 2021-10-01 ENCOUNTER — Inpatient Hospital Stay: Payer: Medicare Other | Attending: Hematology and Oncology

## 2021-10-01 ENCOUNTER — Other Ambulatory Visit: Payer: Self-pay

## 2021-10-01 DIAGNOSIS — D46A Refractory cytopenia with multilineage dysplasia: Secondary | ICD-10-CM | POA: Insufficient documentation

## 2021-10-01 DIAGNOSIS — D469 Myelodysplastic syndrome, unspecified: Secondary | ICD-10-CM

## 2021-10-01 LAB — CMP (CANCER CENTER ONLY)
ALT: 88 U/L — ABNORMAL HIGH (ref 0–44)
AST: 37 U/L (ref 15–41)
Albumin: 3.6 g/dL (ref 3.5–5.0)
Alkaline Phosphatase: 70 U/L (ref 38–126)
Anion gap: 4 — ABNORMAL LOW (ref 5–15)
BUN: 21 mg/dL (ref 8–23)
CO2: 27 mmol/L (ref 22–32)
Calcium: 8.5 mg/dL — ABNORMAL LOW (ref 8.9–10.3)
Chloride: 103 mmol/L (ref 98–111)
Creatinine: 0.97 mg/dL (ref 0.61–1.24)
GFR, Estimated: >60 mL/min (ref >60)
Glucose, Bld: 115 mg/dL — ABNORMAL HIGH (ref 70–99)
Potassium: 4.3 mmol/L (ref 3.5–5.1)
Sodium: 134 mmol/L — ABNORMAL LOW (ref 135–145)
Total Bilirubin: 0.7 mg/dL (ref 0.3–1.2)
Total Protein: 7.9 g/dL (ref 6.5–8.1)

## 2021-10-01 LAB — CBC WITH DIFFERENTIAL (CANCER CENTER ONLY)
Abs Immature Granulocytes: 0.01 K/uL (ref 0.00–0.07)
Basophils Absolute: 0 K/uL (ref 0.0–0.1)
Basophils Relative: 0 %
Eosinophils Absolute: 0.1 K/uL (ref 0.0–0.5)
Eosinophils Relative: 2 %
HCT: 25.8 % — ABNORMAL LOW (ref 39.0–52.0)
Hemoglobin: 8.3 g/dL — ABNORMAL LOW (ref 13.0–17.0)
Immature Granulocytes: 0 %
Lymphocytes Relative: 28 %
Lymphs Abs: 1.4 K/uL (ref 0.7–4.0)
MCH: 28.6 pg (ref 26.0–34.0)
MCHC: 32.2 g/dL (ref 30.0–36.0)
MCV: 89 fL (ref 80.0–100.0)
Monocytes Absolute: 2.1 K/uL — ABNORMAL HIGH (ref 0.1–1.0)
Monocytes Relative: 44 %
Neutro Abs: 1.2 K/uL — ABNORMAL LOW (ref 1.7–7.7)
Neutrophils Relative %: 26 %
Platelet Count: 99 K/uL — ABNORMAL LOW (ref 150–400)
RBC: 2.9 MIL/uL — ABNORMAL LOW (ref 4.22–5.81)
RDW: 15.9 % — ABNORMAL HIGH (ref 11.5–15.5)
WBC Count: 4.8 K/uL (ref 4.0–10.5)
nRBC: 0 % (ref 0.0–0.2)

## 2021-10-01 LAB — SAMPLE TO BLOOD BANK

## 2021-10-01 NOTE — Progress Notes (Signed)
Pt Hgb 8.3 and asymptomatic.  Per MD pt does not meet parameters for Blood Transfusion this week.  Upcoming infusion appt canceled, pt notified and verbalized understanding.

## 2021-10-02 LAB — FERRITIN: Ferritin: 6446 ng/mL — ABNORMAL HIGH (ref 24–336)

## 2021-10-02 LAB — IRON AND IRON BINDING CAPACITY (CC-WL,HP ONLY)
Iron: 176 ug/dL (ref 45–182)
Saturation Ratios: 96 % — ABNORMAL HIGH (ref 17.9–39.5)
TIBC: 183 ug/dL — ABNORMAL LOW (ref 250–450)
UIBC: 7 ug/dL — ABNORMAL LOW (ref 117–376)

## 2021-10-02 NOTE — Assessment & Plan Note (Signed)
Treatment plan:  Aranesp every 3 weeks 500 mcg (increased from 300 mcg) and supportive care with blood transfusions °  °Lab review: °01/29/2020: Hemoglobin 7.8, MCV 103.4, platelets 149 °03/28/2020: Hemoglobin is 9 (it was 6.9 on 03/22/2020) °05/23/2020: Hemoglobin 7.1 (received blood transfusion)  °07/05/2020: Hemoglobin 6.2 (received blood transfusion) °07/26/2020: Hemoglobin 7.3 (received blood transfusion) °03/06/2021: Hemoglobin 5.4 (2 units PRBC) °04/30/2021: Hemoglobin 6.9 (2 units PRBC °05/28/2021: Hemoglobin 7.3 °08/06/2021: Hemoglobin 7.5 °10/01/21: Hb 8.3 °  °Bone marrow biopsy: Hypercellular bone marrow for age 40 to 60% cellularity with dyspoietic changes associated with abundant ring sideroblasts.  No increase in blastic cells identified.  Findings favor MDS with ringed sideroblasts °Cytogenetics and FISH: SF3B1 mutation detected (present in 80% of MDS with ringed sideroblasts) no other mutations °  °Treatment plan: blood transfusions and supportive care °If hemoglobin is 7.5 or less: 1 unit °If hemoglobin is 6.9 or less: 2 units °  °Elevated ferritin: Iron overload from frequent blood transfusions: on Exjade. 1000 mg a day  °Hospitalization: 12/12/2020-12/17/2020: CT showing large necrotic left lower lobe lung mass, severe sepsis °Bronchoscopy, EBUS 12/16/2020: Severe inflammation, no malignancy °  °Return to clinic every 2 weeks for blood transfusions °I will see the patient back in 2 months °

## 2021-10-03 ENCOUNTER — Inpatient Hospital Stay: Payer: Medicare Other | Admitting: Hematology and Oncology

## 2021-10-03 ENCOUNTER — Other Ambulatory Visit: Payer: Self-pay

## 2021-10-03 ENCOUNTER — Other Ambulatory Visit (HOSPITAL_COMMUNITY): Payer: Self-pay

## 2021-10-03 DIAGNOSIS — D469 Myelodysplastic syndrome, unspecified: Secondary | ICD-10-CM

## 2021-10-03 DIAGNOSIS — D46A Refractory cytopenia with multilineage dysplasia: Secondary | ICD-10-CM | POA: Diagnosis not present

## 2021-10-03 NOTE — Progress Notes (Signed)
Patient Care Team: Roetta Sessions, NP as PCP - General (Nurse Practitioner) Nahser, Wonda Cheng, MD as PCP - Cardiology (Cardiology)  DIAGNOSIS:  Encounter Diagnosis  Name Primary?   Myelodysplastic syndrome ([place])     CHIEF COMPLIANT: Follow-up of severe anemia related to MDS requiring blood transfusion  INTERVAL HISTORY: Ricky [name] is a 88 year old above-mentioned history of myelodysplastic syndrome who is currently getting supportive care with periodic blood transfusions. Overall his been feeling relatively well.  He does not do much activity.  He is a wheelchair-bound and does not complain of any chest pain or shortness of breath.   ALLERGIES:  has No Known Allergies.  MEDICATIONS:  Current Outpatient Medications  Medication Sig Dispense Refill   cholecalciferol (VITAMIN D3) 25 MCG (1000 UNIT) tablet Take 1,000 Units by mouth daily.     cyanocobalamin 1000 MCG tablet Take 1,000 mcg by mouth daily.     deferasirox (EXJADE) 500 MG disintegrating tablet Take 2 tablets (1,000 mg total) by mouth daily before breakfast. 60 tablet 3   ELIQUIS 5 MG TABS tablet TAKE 1 TABLET BY MOUTH TWICE A DAY 180 tablet 1   Magnesium 250 MG TABS Take 1 tablet (250 mg total) by mouth daily.  0   simvastatin (ZOCOR) 20 MG tablet Take 20 mg by mouth daily.     traMADol (ULTRAM) 50 MG tablet Take 1 tablet (50 mg total) by mouth every 12 (twelve) hours as needed. 10 tablet 0   No current facility-administered medications for this visit.    PHYSICAL EXAMINATION: ECOG PERFORMANCE STATUS: 1 - Symptomatic but completely ambulatory  Vitals:   10/03/21 0828  BP: (!) 102/55  Pulse: 81  Resp: 19  Temp: 97.9 F (36.6 C)  SpO2: 97%   Filed Weights      LABORATORY DATA:  I have reviewed the data as listed CMP Latest Ref Rng & Units 10/01/2021 09/17/2021 12/26/2020  Glucose 70 - 99 mg/dL 115(H) 103(H) 129(H)  BUN 8 - 23 mg/dL 21 23 36(H)  Creatinine 0.61 - 1.24 mg/dL 0.97 0.78 1.16   Sodium 135 - 145 mmol/L 134(L) 134(L) 135  Potassium 3.5 - 5.1 mmol/L 4.3 4.4 4.2  Chloride 98 - 111 mmol/L 103 103 92(L)  CO2 22 - 32 mmol/L $RemoveB'27 26 31  '[id number]$ Calcium 8.9 - 10.3 mg/dL 8.5(L) 8.4(L) 8.4(L)  Total Protein 6.5 - 8.1 g/dL 7.9 7.7 9.0(H)  Total Bilirubin 0.3 - 1.2 mg/dL 0.7 0.8 0.9  Alkaline Phos 38 - 126 U/L 70 72 200(H)  AST 15 - 41 U/L 37 25 22  ALT 0 - 44 U/L 88(H) 55(H) 57(H)    Lab Results  Component Value Date   WBC 4.8 10/01/2021   HGB 8.3 (L) 10/01/2021   HCT 25.8 (L) 10/01/2021   MCV 89.0 10/01/2021   PLT 99 (L) 10/01/2021   NEUTROABS 1.2 (L) 10/01/2021    ASSESSMENT & PLAN:  Myelodysplastic syndrome ([place]) Treatment plan:  Aranesp every 3 weeks 500 mcg (increased from 300 mcg) and supportive care with blood transfusions   Lab review: 01/29/2020: Hemoglobin 7.8, MCV 103.4, platelets 149 03/28/2020: Hemoglobin is 9 (it was 6.9 on 03/22/2020) 05/23/2020: Hemoglobin 7.1 (received blood transfusion)  07/05/2020: Hemoglobin 6.2 (received blood transfusion) 07/26/2020: Hemoglobin 7.3 (received blood transfusion) 03/06/2021: Hemoglobin 5.4 (2 units PRBC) 04/30/2021: Hemoglobin 6.9 (2 units PRBC 05/28/2021: Hemoglobin 7.3 08/06/2021: Hemoglobin 7.5 10/01/21: Hb 8.3   Bone marrow biopsy: Hypercellular bone marrow for age 14 to 60% cellularity with dyspoietic changes associated  with abundant ring sideroblasts.  No increase in blastic cells identified.  Findings favor MDS with ringed sideroblasts Cytogenetics and FISH: SF3B1 mutation detected (present in 80% of MDS with ringed sideroblasts) no other mutations   Treatment plan: blood transfusions and supportive care If hemoglobin is 7.5 or less: 1 unit If hemoglobin is 6.9 or less: 2 units   Elevated ferritin: Iron overload from frequent blood transfusions: on Exjade. 1000 mg a day  Hospitalization: 12/12/2020-12/17/2020: CT showing large necrotic left lower lobe lung mass, severe sepsis Bronchoscopy, EBUS 12/16/2020: Severe  inflammation, no malignancy   Return to clinic every 2 weeks for blood transfusions I will see the patient back in 2 months   No orders of the defined types were placed in this encounter.  The patient has a good understanding of the overall plan. he agrees with it. he will call with any problems that may develop before the next visit here. Total time spent: 30 mins including face to face time and time spent for planning, charting and co-ordination of care   Harriette Ohara, MD 10/03/21

## 2021-10-06 ENCOUNTER — Other Ambulatory Visit (HOSPITAL_COMMUNITY): Payer: Self-pay

## 2021-10-07 ENCOUNTER — Ambulatory Visit (INDEPENDENT_AMBULATORY_CARE_PROVIDER_SITE_OTHER): Payer: Medicare Other

## 2021-10-07 ENCOUNTER — Telehealth: Payer: Self-pay | Admitting: Hematology and Oncology

## 2021-10-07 DIAGNOSIS — I495 Sick sinus syndrome: Secondary | ICD-10-CM | POA: Diagnosis not present

## 2021-10-07 LAB — CUP PACEART REMOTE DEVICE CHECK
Battery Remaining Longevity: 63 mo
Battery Remaining Percentage: 58 %
Battery Voltage: 2.99 V
Brady Statistic AP VP Percent: 1 %
Brady Statistic AP VS Percent: 1.9 %
Brady Statistic AS VP Percent: 1.6 %
Brady Statistic AS VS Percent: 96 %
Brady Statistic RA Percent Paced: 1.2 %
Brady Statistic RV Percent Paced: 1.6 %
Date Time Interrogation Session: 20230116031110
Implantable Lead Implant Date: 20090807
Implantable Lead Implant Date: 20090807
Implantable Lead Location: 753859
Implantable Lead Location: 753860
Implantable Pulse Generator Implant Date: 20170719
Lead Channel Impedance Value: 330 Ohm
Lead Channel Impedance Value: 410 Ohm
Lead Channel Pacing Threshold Amplitude: 0.5 V
Lead Channel Pacing Threshold Amplitude: 1.25 V
Lead Channel Pacing Threshold Pulse Width: 0.5 ms
Lead Channel Pacing Threshold Pulse Width: 1 ms
Lead Channel Sensing Intrinsic Amplitude: 3.1 mV
Lead Channel Sensing Intrinsic Amplitude: 7.3 mV
Lead Channel Setting Pacing Amplitude: 2.25 V
Lead Channel Setting Pacing Amplitude: 2.5 V
Lead Channel Setting Pacing Pulse Width: 1 ms
Lead Channel Setting Sensing Sensitivity: 2 mV
Pulse Gen Model: 2272
Pulse Gen Serial Number: 7927766

## 2021-10-07 NOTE — Telephone Encounter (Signed)
Rescheduled appointment per 1/17 scheduling message. Talked with the patients son. Son is aware of updated appointment times.

## 2021-10-08 ENCOUNTER — Other Ambulatory Visit (HOSPITAL_COMMUNITY): Payer: Self-pay

## 2021-10-08 ENCOUNTER — Encounter: Payer: Self-pay | Admitting: Internal Medicine

## 2021-10-08 ENCOUNTER — Ambulatory Visit (INDEPENDENT_AMBULATORY_CARE_PROVIDER_SITE_OTHER): Payer: Medicare Other

## 2021-10-08 ENCOUNTER — Other Ambulatory Visit: Payer: Self-pay | Admitting: Internal Medicine

## 2021-10-08 ENCOUNTER — Other Ambulatory Visit: Payer: Self-pay

## 2021-10-08 ENCOUNTER — Ambulatory Visit: Payer: Medicare Other | Admitting: Internal Medicine

## 2021-10-08 DIAGNOSIS — R053 Chronic cough: Secondary | ICD-10-CM

## 2021-10-08 MED ORDER — FAMOTIDINE 20 MG PO TABS: ORAL_TABLET | ORAL | 11 refills | Status: AC

## 2021-10-08 MED ORDER — PANTOPRAZOLE SODIUM 40 MG PO TBEC: 40.0000 mg | DELAYED_RELEASE_TABLET | Freq: Every day | ORAL | 2 refills | Status: AC

## 2021-10-08 NOTE — Patient Instructions (Signed)
Pantoprazole (protonix) 40 mg   Take  30-60 min before first meal of the day and Pepcid (famotidine)  20 mg after supper until return to office - this is the best way to tell whether stomach acid is contributing to your problem.     GERD (REFLUX)  is an extremely common cause of respiratory symptoms just like yours , many times with no obvious heartburn at all.    It can be treated with medication, but also with lifestyle changes including elevation of the head of your bed (ideally with 6 -8inch blocks under the headboard of your bed),  Smoking cessation, avoidance of late meals, excessive alcohol, and avoid fatty foods, chocolate, peppermint, colas, red wine, and acidic juices such as orange juice.  NO MINT OR MENTHOL PRODUCTS SO NO COUGH DROPS  USE SUGARLESS CANDY INSTEAD (Jolley ranchers or Stover's or Life Savers) or even ice chips will also do - the key is to swallow to prevent all throat clearing. NO OIL BASED VITAMINS - use powdered substitutes.  Avoid fish oil when coughing.    Please remember to go to the  x-ray department  for your tests - we will call you with the results when they are available     Please schedule a follow up office visit in 6 weeks, call sooner if needed.

## 2021-10-08 NOTE — Progress Notes (Signed)
[name], male    DOB: 11-Jul-1931,  MRN: [id number]   Brief patient profile:  64 yowm quit smoking  1971  referred to pulmonary clinic 10/08/2021 by Clide Deutscher  for chronic cough since March 2022 when admitted with apparent asp pna p what he and his son feel was complete recovery p Covid 08 Sep 2020  Admit date: 12/12/2020 Discharge date: 12/17/2020   Admitted From: SNF Disposition:  SNF   Recommendations for Outpatient Follow-up:  Follow up with PCP in 1-2 weeks Please obtain BMP/CBC in one week     Home Health:No Equipment/Devices:None   Discharge Condition:Stable CODE STATUS:full Diet recommendation: Dysphagia 2 diet   Brief/Interim Summary: 88 y.o. male past medical history of Miller dysplastic syndrome, paroxysmal atrial fibrillation on Eliquis systolic heart failure Boerhaave syndrome presented to the ED for recurrent falls, he was recently seen in the ED with rib fractures for mechanical fall, incidentally was also found to have a large necrotic appearing lung mass in the left lower lobe concerning for bronchogenic carcinoma, son and patient denied any loss of consciousness no loss of sphincter control in the ED they noticed left arm weakness and a facial droop,      Significant studies: 12/13/2020 CT of the head streak and beam hardening artifact arising from the dental restoration partially obscured occipital lobe and obscured posterior fossa, chronic right basal ganglia lacunar infarct 12/13/2020 CT angio of the neck showed 2 mm vascular protrusion arising for the paraclinoid right ICA, bilateral caloric artery without significant stenosis. 12/13/2020 2 CT angio of the head no intracranial large vessel occlusions.     Discharge Diagnoses:  Principal Problem:   Pneumonia of left lower lobe due to infectious organism   Paroxysmal atrial fibrillation (HCC)   Chronic anticoagulation   Myelodysplastic syndrome (HCC)   Coronary artery disease involving native  coronary artery   Mixed hyperlipidemia   Acute metabolic encephalopathy   Fall at home, initial encounter   Mass of lower lobe of left lung   Chronic diastolic CHF (congestive heart failure) ([place])     Acute metabolic encephalopathy: Likely due to infectious etiology now resolved. Neurology was consulted to perform a stroke work-up which was negative. He completed his course of antibiotics in house blood cultures remain negative.   Severe sepsis due to postobstructive pneumonia present on admission: He was started empirically on Rocephin and azithromycin he defervesced this is a encephalopathy resolved he completed his course in house.   Normocytic anemia: His Eliquis was held for bronchoscopy done on 11/13/2026, due to his mild drop in his hemoglobin he was transfused 1 unit of packed red blood cells his hemoglobin since then has remained stable. He could go back on Eliquis as an outpatient.   Necrotic mass of the left lower lobe: Incidentally noted on previous EGD. We contacted pulmonary who agreed to perform bronchoscopy on 12/16/2020 > neg   ST eval 12/17/20  mod asp risk Dysphagia 2 (Fine chop) solids;Honey thick liquids   Liquid Administration via Cup  Medication Administration Crushed with puree  Compensations Minimize environmental distractions;Slow rate;Small sips/bites;Multiple dry swallows after each bite/sip;Effortful swallow  Postural Changes Remain semi-upright after after feeds/meals (Comment);Seated upright at 90 degrees    History of Present Illness  10/08/2021  Pulmonary/ 1st office eval/[facility]  Chief Complaint  Patient presents with   Consult    C/o persistant cough x 6 mths., yellow at times.Denies sob  Dyspnea: ambulating  150  ft with rolling walker since March  2022 [facility] - was doing 5 min walk to meals prior to admit in 11/2020  Cough: p meals but not typically productive / assoc with sneeze  Sleep: 10 degrees electric bed s noct cough  SABA use: none  No  obvious other patterns in day to day or daytime variability  in coughing or assoc excess/ purulent sputum or mucus plugs or hemoptysis or cp or chest tightness, subjective wheeze or overt  hb symptoms.   Sleeping as above  without nocturnal  or early am exacerbation  of respiratory  c/o's or need for noct saba. Also denies any obvious fluctuation of symptoms with weather or environmental changes or other aggravating or alleviating factors except as outlined above   No unusual exposure hx or h/o childhood pna/ asthma or knowledge of premature birth.  Current Allergies, Complete Past Medical History, Past Surgical History, Family History, and Social History were reviewed in [employer] record.  ROS  The following are not active complaints unless bolded Hoarseness, sore throat, dysphagia, dental problems, itching, sneezing,  nasal congestion or discharge of excess mucus or purulent secretions, ear ache,   fever, chills, sweats, unintended wt loss or wt gain, classically pleuritic or exertional cp,  orthopnea pnd or arm/hand swelling  or leg swelling, presyncope, palpitations, abdominal pain, anorexia, nausea, vomiting, diarrhea  or change in bowel habits or change in bladder habits, change in stools or change in urine, dysuria, hematuria,  rash, arthralgias, visual complaints, headache, numbness, weakness or ataxia or problems with walking or coordination,  change in mood or  memory.           Past Medical History:  Diagnosis Date   A-fib ([place])    Anemia    Anemia of chronic disease 02/02/2020   Atrial fibrillation (HCC)    Boerhaave's syndrome    Chronic anticoagulation 01/22/2020   Colon polyps    COVID-19    Hx of colonic polyps 01/22/2020   2017 - [place]   Hypomagnesemia    Low grade myelodysplastic syndrome lesions ([place]) 02/02/2020   Lower urinary tract symptoms due to benign prostatic hyperplasia    Pacemaker    Paroxysmal atrial fibrillation (HCC)    Prostate  enlargement    Symptomatic anemia 12/26/2019   Vitamin D deficiency     Outpatient Medications Prior to Visit  Medication Sig Dispense Refill   allopurinol (ZYLOPRIM) 100 MG tablet Take 100 mg by mouth daily.     cholecalciferol (VITAMIN D3) 25 MCG (1000 UNIT) tablet Take 1,000 Units by mouth daily.     cyanocobalamin 1000 MCG tablet Take 1,000 mcg by mouth daily.     ELIQUIS 5 MG TABS tablet TAKE 1 TABLET BY MOUTH TWICE A DAY 180 tablet 1   Magnesium 250 MG TABS Take 1 tablet (250 mg total) by mouth daily.  0   simvastatin (ZOCOR) 20 MG tablet Take 20 mg by mouth daily.     deferasirox (EXJADE) 500 MG disintegrating tablet Take 2 tablets (1,000 mg total) by mouth daily before breakfast. 60 tablet 3   traMADol (ULTRAM) 50 MG tablet Take 1 tablet (50 mg total) by mouth every 12 (twelve) hours as needed. (Patient not taking: Reported on 10/08/2021) 10 tablet 0   No facility-administered medications prior to visit.     Objective:     BP 118/60 (BP Location: Right Arm, Cuff Size: Normal)    Pulse 72    Temp 98.1 F (36.7 C) (Temporal)    Ht 5'  11" (1.803 m)    Wt 182 lb 9.6 oz (82.8 kg)    SpO2 96% Comment: RA   BMI 25.47 kg/m   SpO2: 96 % (RA)  W/c bound elderly wm very stoic s spont coughing/ his cough though on voluntary maneuver is extremely puny    HEENT : pt wearing mask not removed for exam due to covid -19 concerns.    NECK :  without JVD/Nodes/TM/ nl carotid upstrokes bilaterally   LUNGS: no acc muscle use,  Nl contour chest which is clear to A and P bilaterally without cough on insp or exp maneuvers   CV:  RRR  no s3 or murmur or increase in P2, and no edema   ABD:  soft and nontender with nl inspiratory excursion in the supine position. No bruits or organomegaly appreciated, bowel sounds nl  MS:   Ext warm without deformities, calf tenderness, cyanosis or clubbing No obvious joint restrictions   SKIN: warm and dry without lesions    NEURO:  ? Somewhat slow  responses (? All due to hearing)   no obvious motor or cerebellar deficits  apparent.    CXR PA and Lateral:   10/08/2021 :    I personally reviewed images and agree with radiology impression as follows:    1. Improved aeration of the left lower lobe with mild opacity today which could reflect atelectasis or recurrent infection. 2. Mildly increased hazy opacity in the left mid lung which could also reflect early pneumonia. 3. Persistent small left pleural effusion My review:  hard to compare portable with pa and lateral view so not sure this is a new effusion on L or new as dz      Assessment   Chronic cough Onset March 2022 assoc dysphagia s/p necrotizing asp pna see admit 11/2020 - trial of gerd rx/ diet  10/08/2021 >>>  Obvious assoc with general geriactric decline and esophageal dysfunction with ongoing risk of asp from food but also GERD with acid aspiration potentially the most damaging so rec max gerd rx trial and continued ST input.  Ultimately will need to make a decision re some form of feeding tube but will defer that to ST/ PCP.   F/u in 6 weeks  Discussed in detail all the  indications, usual  risks and alternatives  relative to the benefits with patient who agrees to proceed with Rx as outlined.             Each maintenance medication was reviewed in detail including emphasizing most importantly the difference between maintenance and prns and under what circumstances the prns are to be triggered using an action plan format where appropriate.  Total time for H and P, chart review, counseling,   and generating customized AVS unique to this office visit / same day charting > 45 min           Christinia Gully, MD 10/08/2021

## 2021-10-09 ENCOUNTER — Encounter: Payer: Self-pay | Admitting: Internal Medicine

## 2021-10-09 NOTE — Assessment & Plan Note (Signed)
Onset March 2022 assoc dysphagia s/p necrotizing asp pna see admit 11/2020 - trial of gerd rx/ diet  10/08/2021 >>>  Obvious assoc with general geriactric decline and esophageal dysfunction with ongoing risk of asp from food but also GERD with acid aspiration potentially the most damaging so rec max gerd rx trial and continued ST input.  Ultimately will need to make a decision re some form of feeding tube but will defer that to ST/ PCP.   F/u in 6 weeks  Discussed in detail all the  indications, usual  risks and alternatives  relative to the benefits with patient who agrees to proceed with Rx as outlined.             Each maintenance medication was reviewed in detail including emphasizing most importantly the difference between maintenance and prns and under what circumstances the prns are to be triggered using an action plan format where appropriate.  Total time for H and P, chart review, counseling,   and generating customized AVS unique to this office visit / same day charting > 45 min

## 2021-10-15 ENCOUNTER — Encounter: Payer: Self-pay | Admitting: Hematology and Oncology

## 2021-10-15 ENCOUNTER — Other Ambulatory Visit: Payer: Self-pay | Admitting: *Deleted

## 2021-10-15 ENCOUNTER — Inpatient Hospital Stay: Payer: Medicare Other

## 2021-10-15 ENCOUNTER — Other Ambulatory Visit: Payer: Self-pay

## 2021-10-15 DIAGNOSIS — D469 Myelodysplastic syndrome, unspecified: Secondary | ICD-10-CM

## 2021-10-15 DIAGNOSIS — D46A Refractory cytopenia with multilineage dysplasia: Secondary | ICD-10-CM | POA: Diagnosis not present

## 2021-10-15 DIAGNOSIS — D649 Anemia, unspecified: Secondary | ICD-10-CM

## 2021-10-15 LAB — IRON AND IRON BINDING CAPACITY (CC-WL,HP ONLY)
Iron: 164 ug/dL (ref 45–182)
Saturation Ratios: 88 % — ABNORMAL HIGH (ref 17.9–39.5)
TIBC: 186 ug/dL — ABNORMAL LOW (ref 250–450)
UIBC: 22 ug/dL

## 2021-10-15 LAB — CMP (CANCER CENTER ONLY)
ALT: 49 U/L — ABNORMAL HIGH (ref 0–44)
AST: 24 U/L (ref 15–41)
Albumin: 3.6 g/dL (ref 3.5–5.0)
Alkaline Phosphatase: 71 U/L (ref 38–126)
Anion gap: 6 (ref 5–15)
BUN: 26 mg/dL — ABNORMAL HIGH (ref 8–23)
CO2: 26 mmol/L (ref 22–32)
Calcium: 8.5 mg/dL — ABNORMAL LOW (ref 8.9–10.3)
Chloride: 102 mmol/L (ref 98–111)
Creatinine: 0.95 mg/dL (ref 0.61–1.24)
GFR, Estimated: >60 mL/min (ref >60)
Glucose, Bld: 128 mg/dL — ABNORMAL HIGH (ref 70–99)
Potassium: 4 mmol/L (ref 3.5–5.1)
Sodium: 134 mmol/L — ABNORMAL LOW (ref 135–145)
Total Bilirubin: 0.6 mg/dL (ref 0.3–1.2)
Total Protein: 8 g/dL (ref 6.5–8.1)

## 2021-10-15 LAB — CBC WITH DIFFERENTIAL (CANCER CENTER ONLY)
Abs Immature Granulocytes: 0.01 10*3/uL (ref 0.00–0.07)
Basophils Absolute: 0 10*3/uL (ref 0.0–0.1)
Basophils Relative: 0 %
Eosinophils Absolute: 0.1 10*3/uL (ref 0.0–0.5)
Eosinophils Relative: 3 %
HCT: 20.2 % — ABNORMAL LOW (ref 39.0–52.0)
Hemoglobin: 6.4 g/dL — CL (ref 13.0–17.0)
Immature Granulocytes: 0 %
Lymphocytes Relative: 26 %
Lymphs Abs: 1.1 10*3/uL (ref 0.7–4.0)
MCH: 27.9 pg (ref 26.0–34.0)
MCHC: 31.7 g/dL (ref 30.0–36.0)
MCV: 88.2 fL (ref 80.0–100.0)
Monocytes Absolute: 1.6 10*3/uL — ABNORMAL HIGH (ref 0.1–1.0)
Monocytes Relative: 39 %
Neutro Abs: 1.3 10*3/uL — ABNORMAL LOW (ref 1.7–7.7)
Neutrophils Relative %: 32 %
Platelet Count: 124 10*3/uL — ABNORMAL LOW (ref 150–400)
RBC: 2.29 MIL/uL — ABNORMAL LOW (ref 4.22–5.81)
RDW: 16.2 % — ABNORMAL HIGH (ref 11.5–15.5)
WBC Count: 4.1 10*3/uL (ref 4.0–10.5)
nRBC: 0 % (ref 0.0–0.2)

## 2021-10-15 LAB — PREPARE RBC (CROSSMATCH)

## 2021-10-15 NOTE — Progress Notes (Signed)
CRITICAL VALUE STICKER  CRITICAL VALUE: Hgb 6.4  Geraldo Docker, RN  DATE & TIME NOTIFIED: 10/15/21 at 57   MD NOTIFIED: Nicholas Lose, MD  [place]: 10/15/21 at 1509  RESPONSE:  MD notified, verbalized understanding and verbal orders received for pt to receive 2 units PRBC's on Friday.  Orders placed and pt son notified.

## 2021-10-15 NOTE — Telephone Encounter (Signed)
error 

## 2021-10-16 LAB — SAMPLE TO BLOOD BANK

## 2021-10-16 LAB — FERRITIN: Ferritin: 5006 ng/mL — ABNORMAL HIGH (ref 24–336)

## 2021-10-17 ENCOUNTER — Other Ambulatory Visit: Payer: Self-pay | Admitting: Hematology and Oncology

## 2021-10-17 ENCOUNTER — Inpatient Hospital Stay: Payer: Medicare Other

## 2021-10-17 ENCOUNTER — Other Ambulatory Visit: Payer: Self-pay

## 2021-10-17 DIAGNOSIS — D649 Anemia, unspecified: Secondary | ICD-10-CM

## 2021-10-17 DIAGNOSIS — D46A Refractory cytopenia with multilineage dysplasia: Secondary | ICD-10-CM | POA: Diagnosis not present

## 2021-10-17 MED ORDER — DIPHENHYDRAMINE HCL 25 MG PO CAPS
25.0000 mg | ORAL_CAPSULE | Freq: Once | ORAL | Status: AC
  Administered 2021-10-17: 25 mg via ORAL
  Filled 2021-10-17: qty 1

## 2021-10-17 MED ORDER — SODIUM CHLORIDE 0.9% IV SOLUTION
250.0000 mL | Freq: Once | INTRAVENOUS | Status: AC
  Administered 2021-10-17: 250 mL via INTRAVENOUS

## 2021-10-17 MED ORDER — ACETAMINOPHEN 325 MG PO TABS
650.0000 mg | ORAL_TABLET | Freq: Once | ORAL | Status: AC
  Administered 2021-10-17: 650 mg via ORAL
  Filled 2021-10-17: qty 2

## 2021-10-17 NOTE — Patient Instructions (Signed)

## 2021-10-20 LAB — TYPE AND SCREEN
ABO/RH(D): B POS
Antibody Screen: NEGATIVE
Donor AG Type: NEGATIVE
Donor AG Type: NEGATIVE
Unit division: 0
Unit division: 0

## 2021-10-20 LAB — BPAM RBC
Blood Product Expiration Date: 202302132359
Blood Product Expiration Date: 202302212359
ISSUE DATE / TIME: 202301270937
ISSUE DATE / TIME: 202301270938
Unit Type and Rh: 7300
Unit Type and Rh: 7300

## 2021-10-20 NOTE — Progress Notes (Signed)
Remote pacemaker transmission.   

## 2021-10-29 ENCOUNTER — Other Ambulatory Visit: Payer: Self-pay

## 2021-10-29 ENCOUNTER — Inpatient Hospital Stay: Payer: Medicare Other | Attending: Hematology and Oncology

## 2021-10-29 DIAGNOSIS — D469 Myelodysplastic syndrome, unspecified: Secondary | ICD-10-CM

## 2021-10-29 DIAGNOSIS — D46A Refractory cytopenia with multilineage dysplasia: Secondary | ICD-10-CM | POA: Insufficient documentation

## 2021-10-29 DIAGNOSIS — D638 Anemia in other chronic diseases classified elsewhere: Secondary | ICD-10-CM

## 2021-10-29 LAB — CMP (CANCER CENTER ONLY)
ALT: 55 U/L — ABNORMAL HIGH (ref 0–44)
AST: 26 U/L (ref 15–41)
Albumin: 3.7 g/dL (ref 3.5–5.0)
Alkaline Phosphatase: 73 U/L (ref 38–126)
Anion gap: 6 (ref 5–15)
BUN: 23 mg/dL (ref 8–23)
CO2: 25 mmol/L (ref 22–32)
Calcium: 8.6 mg/dL — ABNORMAL LOW (ref 8.9–10.3)
Chloride: 103 mmol/L (ref 98–111)
Creatinine: 1.09 mg/dL (ref 0.61–1.24)
GFR, Estimated: >60 mL/min (ref >60)
Glucose, Bld: 102 mg/dL — ABNORMAL HIGH (ref 70–99)
Potassium: 4.2 mmol/L (ref 3.5–5.1)
Sodium: 134 mmol/L — ABNORMAL LOW (ref 135–145)
Total Bilirubin: 0.6 mg/dL (ref 0.3–1.2)
Total Protein: 8 g/dL (ref 6.5–8.1)

## 2021-10-29 LAB — CBC WITH DIFFERENTIAL (CANCER CENTER ONLY)
Abs Immature Granulocytes: 0.03 10*3/uL (ref 0.00–0.07)
Basophils Absolute: 0 10*3/uL (ref 0.0–0.1)
Basophils Relative: 0 %
Eosinophils Absolute: 0.1 10*3/uL (ref 0.0–0.5)
Eosinophils Relative: 1 %
HCT: 22.9 % — ABNORMAL LOW (ref 39.0–52.0)
Hemoglobin: 7.8 g/dL — ABNORMAL LOW (ref 13.0–17.0)
Immature Granulocytes: 0 %
Lymphocytes Relative: 25 %
Lymphs Abs: 2.3 10*3/uL (ref 0.7–4.0)
MCH: 29.3 pg (ref 26.0–34.0)
MCHC: 34.1 g/dL (ref 30.0–36.0)
MCV: 86.1 fL (ref 80.0–100.0)
Monocytes Absolute: 2.8 10*3/uL — ABNORMAL HIGH (ref 0.1–1.0)
Monocytes Relative: 30 %
Neutro Abs: 4.1 10*3/uL (ref 1.7–7.7)
Neutrophils Relative %: 44 %
Platelet Count: 118 10*3/uL — ABNORMAL LOW (ref 150–400)
RBC: 2.66 MIL/uL — ABNORMAL LOW (ref 4.22–5.81)
RDW: 17 % — ABNORMAL HIGH (ref 11.5–15.5)
Smear Review: NORMAL
WBC Count: 9.4 10*3/uL (ref 4.0–10.5)
nRBC: 0 % (ref 0.0–0.2)

## 2021-10-29 LAB — IRON AND IRON BINDING CAPACITY (CC-WL,HP ONLY)
Iron: 165 ug/dL (ref 45–182)
Saturation Ratios: 88 % — ABNORMAL HIGH (ref 17.9–39.5)
TIBC: 187 ug/dL — ABNORMAL LOW (ref 250–450)
UIBC: 22 ug/dL

## 2021-10-29 LAB — PREPARE RBC (CROSSMATCH)

## 2021-10-29 LAB — FERRITIN: Ferritin: 5524 ng/mL — ABNORMAL HIGH (ref 24–336)

## 2021-10-29 LAB — SAMPLE TO BLOOD BANK

## 2021-10-29 NOTE — Progress Notes (Addendum)
CRITICAL VALUE STICKER  CRITICAL VALUE:7.8  RECEIVER (on-site recipient of call):  DATE & TIME NOTIFIED:   MESSENGER (representative from lab):  MD NOTIFIED: [place]  TIME OF NOTIFICATION:  RESPONSE: Verbal order receive  Per Dr. Lindi Adie pt. Will receive 1 unit of RBC on 2/10

## 2021-10-31 ENCOUNTER — Other Ambulatory Visit: Payer: Self-pay

## 2021-10-31 ENCOUNTER — Inpatient Hospital Stay: Payer: Medicare Other

## 2021-10-31 DIAGNOSIS — D46A Refractory cytopenia with multilineage dysplasia: Secondary | ICD-10-CM | POA: Diagnosis not present

## 2021-10-31 DIAGNOSIS — D638 Anemia in other chronic diseases classified elsewhere: Secondary | ICD-10-CM

## 2021-10-31 MED ORDER — ACETAMINOPHEN 325 MG PO TABS
650.0000 mg | ORAL_TABLET | Freq: Once | ORAL | Status: AC
  Administered 2021-10-31: 650 mg via ORAL
  Filled 2021-10-31: qty 2

## 2021-10-31 MED ORDER — SODIUM CHLORIDE 0.9% IV SOLUTION
250.0000 mL | Freq: Once | INTRAVENOUS | Status: AC
  Administered 2021-10-31: 250 mL via INTRAVENOUS

## 2021-10-31 MED ORDER — DIPHENHYDRAMINE HCL 25 MG PO CAPS
25.0000 mg | ORAL_CAPSULE | Freq: Once | ORAL | Status: AC
  Administered 2021-10-31: 25 mg via ORAL
  Filled 2021-10-31: qty 1

## 2021-10-31 NOTE — Patient Instructions (Signed)
Blood Transfusion, Adult, Care After This sheet gives you information about how to care for yourself after your procedure. Your doctor may also give you more specific instructions. If you have problems or questions, contact your doctor. What can I expect after the procedure? After the procedure, it is common to have: Bruising and soreness at the IV site. A headache. Follow these instructions at home: Insertion site care   Follow instructions from your doctor about how to take care of your insertion site. This is where an IV tube was put into your vein. Make sure you: Wash your hands with soap and water before and after you change your bandage (dressing). If you cannot use soap and water, use hand sanitizer. Change your bandage as told by your doctor. Check your insertion site every day for signs of infection. Check for: Redness, swelling, or pain. Bleeding from the site. Warmth. Pus or a bad smell. General instructions Take over-the-counter and prescription medicines only as told by your doctor. Rest as told by your doctor. Go back to your normal activities as told by your doctor. Keep all follow-up visits as told by your doctor. This is important. Contact a doctor if: You have itching or red, swollen areas of skin (hives). You feel worried or nervous (anxious). You feel weak after doing your normal activities. You have redness, swelling, warmth, or pain around the insertion site. You have blood coming from the insertion site, and the blood does not stop with pressure. You have pus or a bad smell coming from the insertion site. Get help right away if: You have signs of a serious reaction. This may be coming from an allergy or the body's defense system (immune system). Signs include: Trouble breathing or shortness of breath. Swelling of the face or feeling warm (flushed). Fever or chills. Head, chest, or back pain. Dark pee (urine) or blood in the pee. Widespread rash. Fast  heartbeat. Feeling dizzy or light-headed. You may receive your blood transfusion in an outpatient setting. If so, you will be told whom to contact to report any reactions. These symptoms may be an emergency. Do not wait to see if the symptoms will go away. Get medical help right away. Call your local emergency services (911 in the [place] Do not drive yourself to the hospital. Summary Bruising and soreness at the IV site are common. Check your insertion site every day for signs of infection. Rest as told by your doctor. Go back to your normal activities as told by your doctor. Get help right away if you have signs of a serious reaction. This information is not intended to replace advice given to you by your health care provider. Make sure you discuss any questions you have with your health care provider. Document Revised: 01/02/2021 Document Reviewed: 03/02/2019 Elsevier Patient Education  2022 Elsevier Inc.  

## 2021-11-03 LAB — TYPE AND SCREEN
ABO/RH(D): B POS
Antibody Screen: POSITIVE
DAT, IgG: POSITIVE
Donor AG Type: NEGATIVE
Unit division: 0

## 2021-11-03 LAB — BPAM RBC
Blood Product Expiration Date: 202303182359
ISSUE DATE / TIME: 202302100942
Unit Type and Rh: 7300

## 2021-11-12 ENCOUNTER — Other Ambulatory Visit: Payer: Self-pay

## 2021-11-12 ENCOUNTER — Inpatient Hospital Stay (HOSPITAL_BASED_OUTPATIENT_CLINIC_OR_DEPARTMENT_OTHER): Payer: Medicare Other

## 2021-11-12 ENCOUNTER — Other Ambulatory Visit: Payer: Self-pay | Admitting: *Deleted

## 2021-11-12 DIAGNOSIS — D46A Refractory cytopenia with multilineage dysplasia: Secondary | ICD-10-CM | POA: Diagnosis not present

## 2021-11-12 DIAGNOSIS — D638 Anemia in other chronic diseases classified elsewhere: Secondary | ICD-10-CM

## 2021-11-12 DIAGNOSIS — D469 Myelodysplastic syndrome, unspecified: Secondary | ICD-10-CM

## 2021-11-12 LAB — CBC WITH DIFFERENTIAL (CANCER CENTER ONLY)
Abs Immature Granulocytes: 0.02 10*3/uL (ref 0.00–0.07)
Basophils Absolute: 0 10*3/uL (ref 0.0–0.1)
Basophils Relative: 0 %
Eosinophils Absolute: 0.1 10*3/uL (ref 0.0–0.5)
Eosinophils Relative: 2 %
HCT: 23.1 % — ABNORMAL LOW (ref 39.0–52.0)
Hemoglobin: 7.4 g/dL — ABNORMAL LOW (ref 13.0–17.0)
Immature Granulocytes: 0 %
Lymphocytes Relative: 26 %
Lymphs Abs: 1.5 10*3/uL (ref 0.7–4.0)
MCH: 28.8 pg (ref 26.0–34.0)
MCHC: 32 g/dL (ref 30.0–36.0)
MCV: 89.9 fL (ref 80.0–100.0)
Monocytes Absolute: 2.4 10*3/uL — ABNORMAL HIGH (ref 0.1–1.0)
Monocytes Relative: 43 %
Neutro Abs: 1.7 10*3/uL (ref 1.7–7.7)
Neutrophils Relative %: 29 %
Platelet Count: 123 10*3/uL — ABNORMAL LOW (ref 150–400)
RBC: 2.57 MIL/uL — ABNORMAL LOW (ref 4.22–5.81)
RDW: 17.1 % — ABNORMAL HIGH (ref 11.5–15.5)
WBC Count: 5.6 10*3/uL (ref 4.0–10.5)
nRBC: 0 % (ref 0.0–0.2)

## 2021-11-12 LAB — CMP (CANCER CENTER ONLY)
ALT: 83 U/L — ABNORMAL HIGH (ref 0–44)
AST: 37 U/L (ref 15–41)
Albumin: 3.6 g/dL (ref 3.5–5.0)
Alkaline Phosphatase: 68 U/L (ref 38–126)
Anion gap: 4 — ABNORMAL LOW (ref 5–15)
BUN: 24 mg/dL — ABNORMAL HIGH (ref 8–23)
CO2: 26 mmol/L (ref 22–32)
Calcium: 8.5 mg/dL — ABNORMAL LOW (ref 8.9–10.3)
Chloride: 103 mmol/L (ref 98–111)
Creatinine: 1.25 mg/dL — ABNORMAL HIGH (ref 0.61–1.24)
GFR, Estimated: 55 mL/min — ABNORMAL LOW (ref >60)
Glucose, Bld: 137 mg/dL — ABNORMAL HIGH (ref 70–99)
Potassium: 4.2 mmol/L (ref 3.5–5.1)
Sodium: 133 mmol/L — ABNORMAL LOW (ref 135–145)
Total Bilirubin: 0.6 mg/dL (ref 0.3–1.2)
Total Protein: 7.9 g/dL (ref 6.5–8.1)

## 2021-11-12 LAB — IRON AND IRON BINDING CAPACITY (CC-WL,HP ONLY)
Iron: 171 ug/dL (ref 45–182)
Saturation Ratios: 92 % — ABNORMAL HIGH (ref 17.9–39.5)
TIBC: 185 ug/dL — ABNORMAL LOW (ref 250–450)
UIBC: 14 ug/dL

## 2021-11-12 LAB — SAMPLE TO BLOOD BANK

## 2021-11-12 LAB — PREPARE RBC (CROSSMATCH)

## 2021-11-12 NOTE — Progress Notes (Signed)
Pt Hgb 7.4.  per MD parameters, pt needing to receive 1 unit PRBC's.  Verbal orders received for 1 unit PRBC's and placed.

## 2021-11-13 LAB — FERRITIN: Ferritin: 6335 ng/mL — ABNORMAL HIGH (ref 24–336)

## 2021-11-14 ENCOUNTER — Other Ambulatory Visit: Payer: Self-pay

## 2021-11-14 ENCOUNTER — Inpatient Hospital Stay: Payer: Medicare Other

## 2021-11-14 DIAGNOSIS — D638 Anemia in other chronic diseases classified elsewhere: Secondary | ICD-10-CM

## 2021-11-14 DIAGNOSIS — D46A Refractory cytopenia with multilineage dysplasia: Secondary | ICD-10-CM | POA: Diagnosis not present

## 2021-11-14 MED ORDER — DIPHENHYDRAMINE HCL 25 MG PO CAPS
25.0000 mg | ORAL_CAPSULE | Freq: Once | ORAL | Status: AC
  Administered 2021-11-14: 25 mg via ORAL
  Filled 2021-11-14: qty 1

## 2021-11-14 MED ORDER — ACETAMINOPHEN 325 MG PO TABS
650.0000 mg | ORAL_TABLET | Freq: Once | ORAL | Status: AC
  Administered 2021-11-14: 650 mg via ORAL
  Filled 2021-11-14: qty 2

## 2021-11-14 MED ORDER — SODIUM CHLORIDE 0.9% IV SOLUTION
250.0000 mL | Freq: Once | INTRAVENOUS | Status: AC
  Administered 2021-11-14: 250 mL via INTRAVENOUS

## 2021-11-14 NOTE — Patient Instructions (Signed)
Blood Transfusion, Adult, Care After This sheet gives you information about how to care for yourself after your procedure. Your doctor may also give you more specific instructions. If you have problems or questions, contact your doctor. What can I expect after the procedure? After the procedure, it is common to have: Bruising and soreness at the IV site. A headache. Follow these instructions at home: Insertion site care   Follow instructions from your doctor about how to take care of your insertion site. This is where an IV tube was put into your vein. Make sure you: Wash your hands with soap and water before and after you change your bandage (dressing). If you cannot use soap and water, use hand sanitizer. Change your bandage as told by your doctor. Check your insertion site every day for signs of infection. Check for: Redness, swelling, or pain. Bleeding from the site. Warmth. Pus or a bad smell. General instructions Take over-the-counter and prescription medicines only as told by your doctor. Rest as told by your doctor. Go back to your normal activities as told by your doctor. Keep all follow-up visits as told by your doctor. This is important. Contact a doctor if: You have itching or red, swollen areas of skin (hives). You feel worried or nervous (anxious). You feel weak after doing your normal activities. You have redness, swelling, warmth, or pain around the insertion site. You have blood coming from the insertion site, and the blood does not stop with pressure. You have pus or a bad smell coming from the insertion site. Get help right away if: You have signs of a serious reaction. This may be coming from an allergy or the body's defense system (immune system). Signs include: Trouble breathing or shortness of breath. Swelling of the face or feeling warm (flushed). Fever or chills. Head, chest, or back pain. Dark pee (urine) or blood in the pee. Widespread rash. Fast  heartbeat. Feeling dizzy or light-headed. You may receive your blood transfusion in an outpatient setting. If so, you will be told whom to contact to report any reactions. These symptoms may be an emergency. Do not wait to see if the symptoms will go away. Get medical help right away. Call your local emergency services (911 in the [place] Do not drive yourself to the hospital. Summary Bruising and soreness at the IV site are common. Check your insertion site every day for signs of infection. Rest as told by your doctor. Go back to your normal activities as told by your doctor. Get help right away if you have signs of a serious reaction. This information is not intended to replace advice given to you by your health care provider. Make sure you discuss any questions you have with your health care provider. Document Revised: 01/02/2021 Document Reviewed: 03/02/2019 Elsevier Patient Education  2022 Elsevier Inc.  

## 2021-11-17 ENCOUNTER — Encounter: Payer: Self-pay | Admitting: Hematology and Oncology

## 2021-11-17 LAB — TYPE AND SCREEN
ABO/RH(D): B POS
Antibody Screen: NEGATIVE
Donor AG Type: NEGATIVE
Unit division: 0

## 2021-11-17 LAB — BPAM RBC
Blood Product Expiration Date: 202303192359
ISSUE DATE / TIME: 202302240934
Unit Type and Rh: 5100

## 2021-11-18 ENCOUNTER — Other Ambulatory Visit: Payer: Self-pay | Admitting: Cardiovascular Disease

## 2021-11-18 NOTE — Telephone Encounter (Signed)
Pt last saw Dr Acie Fredrickson 12/31/20, last labs 11/12/21 Creat 1.25, age 88, weight 82.8kg, based on specified criteria pt is on appropriate dosage of Eliquis 5mg  BID for afib.  Will refill rx.

## 2021-11-19 ENCOUNTER — Ambulatory Visit: Payer: Medicare Other | Admitting: Internal Medicine

## 2021-11-22 ENCOUNTER — Encounter (HOSPITAL_COMMUNITY): Payer: Self-pay

## 2021-11-22 ENCOUNTER — Emergency Department (HOSPITAL_COMMUNITY)
Admission: EM | Admit: 2021-11-22 | Discharge: 2021-11-22 | Disposition: A | Discharge status: Home / Self Care | Payer: Medicare Other | Attending: Emergency Medicine | Admitting: Emergency Medicine

## 2021-11-22 ENCOUNTER — Other Ambulatory Visit: Payer: Self-pay

## 2021-11-22 ENCOUNTER — Emergency Department (HOSPITAL_COMMUNITY): Payer: Medicare Other

## 2021-11-22 DIAGNOSIS — Z7901 Long term (current) use of anticoagulants: Secondary | ICD-10-CM | POA: Diagnosis not present

## 2021-11-22 DIAGNOSIS — R531 Weakness: Secondary | ICD-10-CM | POA: Insufficient documentation

## 2021-11-22 DIAGNOSIS — D649 Anemia, unspecified: Secondary | ICD-10-CM

## 2021-11-22 DIAGNOSIS — R5383 Other fatigue: Secondary | ICD-10-CM | POA: Diagnosis not present

## 2021-11-22 DIAGNOSIS — D469 Myelodysplastic syndrome, unspecified: Secondary | ICD-10-CM | POA: Insufficient documentation

## 2021-11-22 DIAGNOSIS — Z20822 Contact with and (suspected) exposure to covid-19: Secondary | ICD-10-CM | POA: Insufficient documentation

## 2021-11-22 DIAGNOSIS — R509 Fever, unspecified: Secondary | ICD-10-CM | POA: Diagnosis not present

## 2021-11-22 LAB — RESP PANEL BY RT-PCR (FLU A&B, COVID) ARPGX2
Influenza A by PCR: NEGATIVE
Influenza B by PCR: NEGATIVE
SARS Coronavirus 2 by RT PCR: NEGATIVE

## 2021-11-22 LAB — URINALYSIS, ROUTINE W REFLEX MICROSCOPIC
Bilirubin Urine: NEGATIVE
Glucose, UA: NEGATIVE mg/dL
Hgb urine dipstick: NEGATIVE
Ketones, ur: NEGATIVE mg/dL
Leukocytes,Ua: NEGATIVE
Nitrite: NEGATIVE
Protein, ur: NEGATIVE mg/dL
Specific Gravity, Urine: 1.017 (ref 1.005–1.030)
pH: 6 (ref 5.0–8.0)

## 2021-11-22 LAB — CBC WITH DIFFERENTIAL/PLATELET
Abs Immature Granulocytes: 0.04 10*3/uL (ref 0.00–0.07)
Basophils Absolute: 0 10*3/uL (ref 0.0–0.1)
Basophils Relative: 0 %
Eosinophils Absolute: 0 10*3/uL (ref 0.0–0.5)
Eosinophils Relative: 0 %
HCT: 21.5 % — ABNORMAL LOW (ref 39.0–52.0)
Hemoglobin: 7 g/dL — ABNORMAL LOW (ref 13.0–17.0)
Immature Granulocytes: 1 %
Lymphocytes Relative: 7 %
Lymphs Abs: 0.5 10*3/uL — ABNORMAL LOW (ref 0.7–4.0)
MCH: 29.7 pg (ref 26.0–34.0)
MCHC: 32.6 g/dL (ref 30.0–36.0)
MCV: 91.1 fL (ref 80.0–100.0)
Monocytes Absolute: 3.2 10*3/uL — ABNORMAL HIGH (ref 0.1–1.0)
Monocytes Relative: 46 %
Neutro Abs: 3.2 10*3/uL (ref 1.7–7.7)
Neutrophils Relative %: 46 %
Platelets: UNDETERMINED 10*3/uL (ref 150–400)
RBC: 2.36 MIL/uL — ABNORMAL LOW (ref 4.22–5.81)
RDW: 16.5 % — ABNORMAL HIGH (ref 11.5–15.5)
WBC: 6.9 10*3/uL (ref 4.0–10.5)
nRBC: 0 % (ref 0.0–0.2)

## 2021-11-22 LAB — COMPREHENSIVE METABOLIC PANEL
ALT: 52 U/L — ABNORMAL HIGH (ref 0–44)
AST: 25 U/L (ref 15–41)
Albumin: 3.1 g/dL — ABNORMAL LOW (ref 3.5–5.0)
Alkaline Phosphatase: 64 U/L (ref 38–126)
Anion gap: 7 (ref 5–15)
BUN: 18 mg/dL (ref 8–23)
CO2: 24 mmol/L (ref 22–32)
Calcium: 8.5 mg/dL — ABNORMAL LOW (ref 8.9–10.3)
Chloride: 104 mmol/L (ref 98–111)
Creatinine, Ser: 1.1 mg/dL (ref 0.61–1.24)
GFR, Estimated: >60 mL/min (ref >60)
Glucose, Bld: 164 mg/dL — ABNORMAL HIGH (ref 70–99)
Potassium: 3.6 mmol/L (ref 3.5–5.1)
Sodium: 135 mmol/L (ref 135–145)
Total Bilirubin: 0.9 mg/dL (ref 0.3–1.2)
Total Protein: 7.1 g/dL (ref 6.5–8.1)

## 2021-11-22 LAB — POC OCCULT BLOOD, ED: Fecal Occult Bld: NEGATIVE

## 2021-11-22 LAB — LACTIC ACID, PLASMA: Lactic Acid, Venous: 1.5 mmol/L (ref 0.5–1.9)

## 2021-11-22 NOTE — ED Triage Notes (Signed)
Pt arrives via EMS from [facility]. EMS was called out for a lift assist. Pt was reportedly getting out of his bed and his legs gave out. Pt was assisted to the floor. No injury. Pt is alert and oriented x 4. He reports some generalized weakness. EMS states that his son gave him some tylenol pta for a low grade fever.  ?

## 2021-11-22 NOTE — Discharge Instructions (Addendum)
It was our pleasure to provide your ER care today - we hope that you feel better. ? ?Drink plenty of fluids/stay well hydrated. Fall precautions. Maximize your home health services, and follow up closely with your facility/doctor should need to pursue higher level of care.  ? ?From today's labs, and your prior labs, your blood count/hemoglobin is chronically low, and potentially related to feelings of general weakness and fatigue - follow up closely with your doctor in the coming week.   ? ?Return to ER if worse, new symptoms, new/severe pain, chest pain, trouble breathing, high fevers, fainting, or other concern.  ?

## 2021-11-22 NOTE — ED Provider Notes (Signed)
[facility] EMERGENCY DEPARTMENT Provider Note   CSN: [id number] Arrival date & time: 11/22/21  0630     History  Chief Complaint  Patient presents with   Fatigue   Fall    Ricky MCCONAHA is a 88 y.o. male.  Patient c/o generalized weakness in the past 1-2 days. Pt at baseline walks slowly, short distances w walker. Today not able to stand/walk on own. No recent trauma or fall. Was noted at [facility] to have low grade fever today. Patient limited historian - denies specific physical c/o. No headache. No chest pain or sob. No abd pain or nvd. No gu c/o. No extremity pain or swelling.   The history is provided by the patient, a relative, medical records and the EMS personnel. The history is limited by the condition of the patient.  Fall Pertinent negatives include no chest pain, no abdominal pain and no headaches.      Home Medications Prior to Admission medications   Medication Sig Start Date End Date Taking? Authorizing Provider  allopurinol (ZYLOPRIM) 100 MG tablet Take 100 mg by mouth daily. 08/10/21   [provider]  cholecalciferol (VITAMIN D3) 25 MCG (1000 UNIT) tablet Take 1,000 Units by mouth daily.    [provider]  cyanocobalamin 1000 MCG tablet Take 1,000 mcg by mouth daily.    [provider]  ELIQUIS 5 MG TABS tablet TAKE 1 TABLET BY MOUTH TWICE A DAY 11/18/21   Nahser, Wonda Cheng, MD  famotidine (PEPCID) 20 MG tablet One after supper 10/08/21   Tanda Rockers, MD  Magnesium 250 MG TABS Take 1 tablet (250 mg total) by mouth daily. 12/27/19   Barb Merino, MD  pantoprazole (PROTONIX) 40 MG tablet Take 1 tablet (40 mg total) by mouth daily. Take 30-60 min before first meal of the day 10/08/21   Tanda Rockers, MD  simvastatin (ZOCOR) 20 MG tablet Take 20 mg by mouth daily.    [provider]      Allergies    Patient has no known allergies.    Review of Systems   Review of Systems  Constitutional:  Positive for  fever.  HENT:  Negative for sore throat.   Eyes:  Negative for redness.  Respiratory:  Negative for cough.   Cardiovascular:  Negative for chest pain.  Gastrointestinal:  Negative for abdominal pain and vomiting.  Genitourinary:  Negative for dysuria.  Musculoskeletal:  Negative for neck pain and neck stiffness.  Skin:  Negative for rash.  Neurological:  Negative for headaches.  Hematological:        On anticoag therapy.   Psychiatric/Behavioral:  Negative for agitation.    Physical Exam Updated Vital Signs BP (!) 102/58    Pulse 92    Temp 98.5 F (36.9 C)    Resp 19    Ht 1.803 m ('5\' 11"'$ )    SpO2 96%    BMI 25.47 kg/m  Physical Exam Vitals and nursing note reviewed.  Constitutional:      Appearance: Normal appearance. He is well-developed.  HENT:     Head: Atraumatic.     Nose: Nose normal.     Mouth/Throat:     Mouth: Mucous membranes are moist.     Pharynx: Oropharynx is clear.  Eyes:     General: No scleral icterus.    Conjunctiva/sclera: Conjunctivae normal.     Pupils: Pupils are equal, round, and reactive to light.  Neck:     Vascular:  No carotid bruit.     Trachea: No tracheal deviation.     Comments: No stiffness or rigidity.  Cardiovascular:     Rate and Rhythm: Normal rate and regular rhythm.     Pulses: Normal pulses.     Heart sounds: Normal heart sounds. No murmur heard.   No friction rub. No gallop.  Pulmonary:     Effort: Pulmonary effort is normal. No accessory muscle usage or respiratory distress.     Breath sounds: Normal breath sounds.  Abdominal:     General: Bowel sounds are normal. There is no distension.     Palpations: Abdomen is soft.     Tenderness: There is no abdominal tenderness. There is no guarding.  Genitourinary:    Comments: No cva tenderness. Musculoskeletal:        General: No swelling or tenderness.     Cervical back: Normal range of motion and neck supple. No rigidity.     Right lower leg: No edema.     Left lower leg: No  edema.  Skin:    General: Skin is warm and dry.     Findings: No rash.  Neurological:     Mental Status: He is alert.     Comments: Alert, speech clear. +HOH (at baseline). Motor/sens grossly intact bil.   Psychiatric:        Mood and Affect: Mood normal.    ED Results / Procedures / Treatments   Labs (all labs ordered are listed, but only abnormal results are displayed) Results for orders placed or performed during the hospital encounter of 11/22/21  Resp Panel by RT-PCR (Flu A&B, Covid) Nasopharyngeal Swab   Specimen: Nasopharyngeal Swab; Nasopharyngeal(NP) swabs in vial transport medium  Result Value Ref Range   SARS Coronavirus 2 by RT PCR NEGATIVE NEGATIVE   Influenza A by PCR NEGATIVE NEGATIVE   Influenza B by PCR NEGATIVE NEGATIVE  CBC with Differential  Result Value Ref Range   WBC 6.9 4.0 - 10.5 K/uL   RBC 2.36 (L) 4.22 - 5.81 MIL/uL   Hemoglobin 7.0 (L) 13.0 - 17.0 g/dL   HCT 21.5 (L) 39.0 - 52.0 %   MCV 91.1 80.0 - 100.0 fL   MCH 29.7 26.0 - 34.0 pg   MCHC 32.6 30.0 - 36.0 g/dL   RDW 16.5 (H) 11.5 - 15.5 %   Platelets PLATELET CLUMPS NOTED ON SMEAR, UNABLE TO ESTIMATE 150 - 400 K/uL   nRBC 0.0 0.0 - 0.2 %   Neutrophils Relative % 46 %   Neutro Abs 3.2 1.7 - 7.7 K/uL   Lymphocytes Relative 7 %   Lymphs Abs 0.5 (L) 0.7 - 4.0 K/uL   Monocytes Relative 46 %   Monocytes Absolute 3.2 (H) 0.1 - 1.0 K/uL   Eosinophils Relative 0 %   Eosinophils Absolute 0.0 0.0 - 0.5 K/uL   Basophils Relative 0 %   Basophils Absolute 0.0 0.0 - 0.1 K/uL   Immature Granulocytes 1 %   Abs Immature Granulocytes 0.04 0.00 - 0.07 K/uL  Comprehensive metabolic panel  Result Value Ref Range   Sodium 135 135 - 145 mmol/L   Potassium 3.6 3.5 - 5.1 mmol/L   Chloride 104 98 - 111 mmol/L   CO2 24 22 - 32 mmol/L   Glucose, Bld 164 (H) 70 - 99 mg/dL   BUN 18 8 - 23 mg/dL   Creatinine, Ser 1.10 0.61 - 1.24 mg/dL   Calcium 8.5 (L) 8.9 - 10.3 mg/dL   Total  Protein 7.1 6.5 - 8.1 g/dL   Albumin  3.1 (L) 3.5 - 5.0 g/dL   AST 25 15 - 41 U/L   ALT 52 (H) 0 - 44 U/L   Alkaline Phosphatase 64 38 - 126 U/L   Total Bilirubin 0.9 0.3 - 1.2 mg/dL   GFR, Estimated >60 >60 mL/min   Anion gap 7 5 - 15  Lactic acid, plasma  Result Value Ref Range   Lactic Acid, Venous 1.5 0.5 - 1.9 mmol/L  Urinalysis, Routine w reflex microscopic Urine, Clean Catch  Result Value Ref Range   Color, Urine YELLOW YELLOW   APPearance CLEAR CLEAR   Specific Gravity, Urine 1.017 1.005 - 1.030   pH 6.0 5.0 - 8.0   Glucose, UA NEGATIVE NEGATIVE mg/dL   Hgb urine dipstick NEGATIVE NEGATIVE   Bilirubin Urine NEGATIVE NEGATIVE   Ketones, ur NEGATIVE NEGATIVE mg/dL   Protein, ur NEGATIVE NEGATIVE mg/dL   Nitrite NEGATIVE NEGATIVE   Leukocytes,Ua NEGATIVE NEGATIVE  POC occult blood, ED RN will collect  Result Value Ref Range   Fecal Occult Bld NEGATIVE NEGATIVE   DG Chest Port 1 View  Result Date: 11/22/2021 CLINICAL DATA:  Fatigue.  Fever.  Also reported fall. EXAM: PORTABLE CHEST 1 VIEW COMPARISON:  10/08/2021 and older exams. FINDINGS: Cardiac silhouette is normal in size and configuration. Stable left anterior chest wall sequential pacemaker. No mediastinal or hilar masses. Prominent bronchovascular markings most evident in the lower lungs, stable. No lung consolidation. No evidence of edema. No convincing pleural effusion and no pneumothorax. Skeletal structures are grossly intact. IMPRESSION: No acute cardiopulmonary disease. Electronically Signed   By: Lajean Manes M.D.   On: 11/22/2021 10:34    ED ECG REPORT   Date: 11/22/2021  Rate: 81  Rhythm: normal sinus rhythm  QRS Axis: left  Intervals: PR prolonged  ST/T Wave abnormalities: normal  Conduction Disutrbances:left anterior fascicular block  Narrative Interpretation:   Old EKG Reviewed: unchanged  I have personally reviewed the EKG tracing    Radiology CT Head Wo Contrast  Result Date: 11/22/2021 CLINICAL DATA:  Patient's legs gave out while  getting out of bed. Patient assisted to the floor. Generalized weakness. EXAM: CT HEAD WITHOUT CONTRAST TECHNIQUE: Contiguous axial images were obtained from the base of the skull through the vertex without intravenous contrast. RADIATION DOSE REDUCTION: This exam was performed according to the departmental dose-optimization program which includes automated exposure control, adjustment of the mA and/or kV according to patient size and/or use of iterative reconstruction technique. COMPARISON:  12/13/2020 FINDINGS: Brain: No evidence of acute infarction, hemorrhage, hydrocephalus, extra-axial collection or mass lesion/mass effect. There is ventricular sulcal enlargement reflecting mild diffuse atrophy. Patchy white matter hypoattenuation is also noted consistent with mild chronic microvascular ischemic change. These findings are stable. Vascular: No hyperdense vessel or unexpected calcification. Skull: Normal. Negative for fracture or focal lesion. Sinuses/Orbits: Globes and orbits are unremarkable. Dependent fluid in the right sphenoid sinus. Remaining visualized sinuses are clear. Other: None. IMPRESSION: 1. No acute intracranial abnormalities. 2. Mild atrophy and chronic microvascular ischemic change stable from the prior head CT. Electronically Signed   By: Lajean Manes M.D.   On: 11/22/2021 12:34   DG Chest Port 1 View  Result Date: 11/22/2021 CLINICAL DATA:  Fatigue.  Fever.  Also reported fall. EXAM: PORTABLE CHEST 1 VIEW COMPARISON:  10/08/2021 and older exams. FINDINGS: Cardiac silhouette is normal in size and configuration. Stable left anterior chest wall sequential pacemaker. No mediastinal or hilar  masses. Prominent bronchovascular markings most evident in the lower lungs, stable. No lung consolidation. No evidence of edema. No convincing pleural effusion and no pneumothorax. Skeletal structures are grossly intact. IMPRESSION: No acute cardiopulmonary disease. Electronically Signed   By: Lajean Manes  M.D.   On: 11/22/2021 10:34    Procedures Procedures    Medications Ordered in ED Medications - No data to display  ED Course/ Medical Decision Making/ A&P                           Medical Decision Making Problems Addressed: Chronic anemia: chronic illness or injury that poses a threat to life or bodily functions Generalized weakness: acute illness or injury Low grade fever: acute illness or injury Myelodysplastic syndrome ([place]): chronic illness or injury that poses a threat to life or bodily functions  Amount and/or Complexity of Data Reviewed Independent Historian: EMS    Details: family member. External Data Reviewed: labs and notes. Labs: ordered. Decision-making details documented in ED Course. Radiology: ordered and independent interpretation performed. Decision-making details documented in ED Course. ECG/medicine tests: ordered and independent interpretation performed. Decision-making details documented in ED Course.  Risk Decision regarding hospitalization.   Iv ns. Continuous pulse ox and cardiac monitoring. Labs ordered/sent. Imaging ordered. ECG.  Disposition including potential need for admission considered/discussed - will get lab and imaging workup, reassess pt, and make disposition decision.   Reviewed nursing notes and prior charts for additional history.  External reports reviewed. Additional hx from:  Labs reviewed/interpreted by me - hgb low. Will check hemoccult.  Hgb 7 is at/near baseline, hx myelodysplastic syndrome. Hemoccult neg. Lactate normal. Ua neg for uti.   Xrays reviewed/interpreted by me - no pna.   Cardiac monitor: sinus rhythm, rate 93.   CT reviewed/interpreted by me - no hem.   Po fluids/food.   Staff assists w standing, walking - rn indicates family feels c/w pts baseline.   No new c/o on recheck, no pain or other focal c/o.   Given advanced age, co-morbidities, myelodysplastic syndrome, etc. Long and short term prognosis is  limited - pt currently appears stable for d/c. Pt/fam indicate do have home health help in place.   Rec close pcp f/u.  Return precautions provided.             Final Clinical Impression(s) / ED Diagnoses Final diagnoses:  None    Rx / DC Orders ED Discharge Orders     None         Lajean Saver, MD 11/22/21 1440

## 2021-11-22 NOTE — ED Notes (Signed)
Pt was able to ambulate a few steps with 2 person assist and a walker. Son states his ambulatory status is at baseline ? ?

## 2021-11-22 NOTE — ED Notes (Signed)
Sandwich and water provided to patient.

## 2021-11-25 NOTE — Progress Notes (Signed)
? ?Patient Care Team: ?Roetta Sessions, NP as PCP - General (Nurse Practitioner) ?Nahser, Wonda Cheng, MD as PCP - Cardiology (Cardiology) ? ?DIAGNOSIS:  ?  ICD-10-CM   ?1. Myelodysplastic syndrome ([place])  D46.9   ?  ? ? ?CHIEF COMPLIANT: Follow-up of severe anemia related to MDS requiring blood transfusion ? ?INTERVAL HISTORY: Ricky [name] is a 88 y.o. with above-mentioned history of myelodysplastic syndrome who is currently getting supportive care with periodic blood transfusions. He presents to the clinic today for follow-up.  He feels extremely weak and tired.  He went to the emergency room and had extensive work-up performed including CT head and chest x-ray both of which were normal.  He feels very wiped out and tired. ? ?ALLERGIES:  has No Known Allergies. ? ?MEDICATIONS:  ?Current Outpatient Medications  ?Medication Sig Dispense Refill  ? allopurinol (ZYLOPRIM) 100 MG tablet Take 100 mg by mouth daily.    ? cholecalciferol (VITAMIN D3) 25 MCG (1000 UNIT) tablet Take 1,000 Units by mouth daily.    ? cyanocobalamin 1000 MCG tablet Take 1,000 mcg by mouth daily.    ? ELIQUIS 5 MG TABS tablet TAKE 1 TABLET BY MOUTH TWICE A DAY 180 tablet 1  ? famotidine (PEPCID) 20 MG tablet One after supper 30 tablet 11  ? Magnesium 250 MG TABS Take 1 tablet (250 mg total) by mouth daily.  0  ? pantoprazole (PROTONIX) 40 MG tablet Take 1 tablet (40 mg total) by mouth daily. Take 30-60 min before first meal of the day 30 tablet 2  ? simvastatin (ZOCOR) 20 MG tablet Take 20 mg by mouth daily.    ? ?No current facility-administered medications for this visit.  ? ? ?PHYSICAL EXAMINATION: ?ECOG PERFORMANCE STATUS: 1 - Symptomatic but completely ambulatory ? ?Vitals:  ? 11/26/21 1149  ?BP: (!) 114/46  ?Pulse: 86  ?Resp: 16  ?Temp: 98.2 ?F (36.8 ?C)  ?SpO2: 99%  ? ?Filed Weights  ? ? ?LABORATORY DATA:  ?I have reviewed the data as listed ?CMP Latest Ref Rng & Units 11/22/2021 11/12/2021 10/29/2021  ?Glucose 70 - 99 mg/dL 164(H)  137(H) 102(H)  ?BUN 8 - 23 mg/dL 18 24(H) 23  ?Creatinine 0.61 - 1.24 mg/dL 1.10 1.25(H) 1.09  ?Sodium 135 - 145 mmol/L 135 133(L) 134(L)  ?Potassium 3.5 - 5.1 mmol/L 3.6 4.2 4.2  ?Chloride 98 - 111 mmol/L 104 103 103  ?CO2 22 - 32 mmol/L $RemoveB'24 26 25  'aCvEckyt$ ?Calcium 8.9 - 10.3 mg/dL 8.5(L) 8.5(L) 8.6(L)  ?Total Protein 6.5 - 8.1 g/dL 7.1 7.9 8.0  ?Total Bilirubin 0.3 - 1.2 mg/dL 0.9 0.6 0.6  ?Alkaline Phos 38 - 126 U/L 64 68 73  ?AST 15 - 41 U/L 25 37 26  ?ALT 0 - 44 U/L 52(H) 83(H) 55(H)  ? ? ?Lab Results  ?Component Value Date  ? WBC 8.1 11/26/2021  ? HGB 5.9 (LL) 11/26/2021  ? HCT 18.3 (L) 11/26/2021  ? MCV 89.3 11/26/2021  ? PLT 126 (L) 11/26/2021  ? NEUTROABS PENDING 11/26/2021  ? ? ?ASSESSMENT & PLAN:  ?Myelodysplastic syndrome ([place]) ?Treatment plan:  Aranesp every 3 weeks 500 mcg (increased from 300 mcg) and supportive care with blood transfusions ?  ?Lab review: ?10/01/21: Hb 8.3 ?11/26/2021: Hemoglobin 5.9 (2 units of PRBC) ?Because the patient has lots of antibodies we have to set up transfusions to base after blood is drawn. ?  ?Bone marrow biopsy: Hypercellular bone marrow for age 82 to 60% cellularity with dyspoietic changes associated  with abundant ring sideroblasts.  No increase in blastic cells identified.  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  ?Treatment plan: blood transfusions and supportive care ?If hemoglobin is 7.5 or less: 1 unit ?If hemoglobin is 6.9 or less: 2 units ?  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 ?Return to clinic every 2 weeks for blood transfusions ?I will see the patient back in 2 months ? ? ? ?No orders of  the defined types were placed in this encounter. ? ?The patient has a good understanding of the overall plan. he agrees with it. he will call with any problems that may develop before the next visit here. ? ?Total time spent: 30 mins including face to face time and time spent for planning, charting and coordination of care ? ?Rulon Eisenmenger, MD, MPH ?11/26/2021 ? ?I, Thana Ates, am acting as scribe for Dr. Nicholas Lose. ? ?I have reviewed the above documentation for accuracy and completeness, and I agree with the above. ? ? ? ? ? ? ?

## 2021-11-26 ENCOUNTER — Encounter: Payer: Self-pay | Admitting: Hematology and Oncology

## 2021-11-26 ENCOUNTER — Other Ambulatory Visit: Payer: Self-pay | Admitting: *Deleted

## 2021-11-26 ENCOUNTER — Inpatient Hospital Stay: Payer: Medicare Other | Attending: Hematology and Oncology

## 2021-11-26 ENCOUNTER — Inpatient Hospital Stay: Payer: Medicare Other | Admitting: Hematology and Oncology

## 2021-11-26 ENCOUNTER — Other Ambulatory Visit: Payer: Self-pay

## 2021-11-26 DIAGNOSIS — D46A Refractory cytopenia with multilineage dysplasia: Secondary | ICD-10-CM | POA: Insufficient documentation

## 2021-11-26 DIAGNOSIS — D638 Anemia in other chronic diseases classified elsewhere: Secondary | ICD-10-CM

## 2021-11-26 DIAGNOSIS — D469 Myelodysplastic syndrome, unspecified: Secondary | ICD-10-CM | POA: Diagnosis not present

## 2021-11-26 LAB — CMP (CANCER CENTER ONLY)
ALT: 50 U/L — ABNORMAL HIGH (ref 0–44)
AST: 22 U/L (ref 15–41)
Albumin: 3.4 g/dL — ABNORMAL LOW (ref 3.5–5.0)
Alkaline Phosphatase: 71 U/L (ref 38–126)
Anion gap: 5 (ref 5–15)
BUN: 22 mg/dL (ref 8–23)
CO2: 26 mmol/L (ref 22–32)
Calcium: 8.7 mg/dL — ABNORMAL LOW (ref 8.9–10.3)
Chloride: 105 mmol/L (ref 98–111)
Creatinine: 0.9 mg/dL (ref 0.61–1.24)
GFR, Estimated: >60 mL/min (ref >60)
Glucose, Bld: 106 mg/dL — ABNORMAL HIGH (ref 70–99)
Potassium: 3.9 mmol/L (ref 3.5–5.1)
Sodium: 136 mmol/L (ref 135–145)
Total Bilirubin: 0.9 mg/dL (ref 0.3–1.2)
Total Protein: 7.4 g/dL (ref 6.5–8.1)

## 2021-11-26 LAB — CBC WITH DIFFERENTIAL (CANCER CENTER ONLY)
Abs Immature Granulocytes: 0 10*3/uL (ref 0.00–0.07)
Band Neutrophils: 0 %
Basophils Absolute: 0 10*3/uL (ref 0.0–0.1)
Basophils Relative: 0 %
Eosinophils Absolute: 0.1 10*3/uL (ref 0.0–0.5)
Eosinophils Relative: 1 %
HCT: 18.3 % — ABNORMAL LOW (ref 39.0–52.0)
Hemoglobin: 5.9 g/dL — CL (ref 13.0–17.0)
Lymphocytes Relative: 14 %
Lymphs Abs: 1.1 10*3/uL (ref 0.7–4.0)
MCH: 28.8 pg (ref 26.0–34.0)
MCHC: 32.2 g/dL (ref 30.0–36.0)
MCV: 89.3 fL (ref 80.0–100.0)
Monocytes Absolute: 3.5 10*3/uL — ABNORMAL HIGH (ref 0.1–1.0)
Monocytes Relative: 44 %
Neutro Abs: 3.3 10*3/uL (ref 1.7–7.7)
Neutrophils Relative %: 41 %
Platelet Count: 126 10*3/uL — ABNORMAL LOW (ref 150–400)
RBC: 2.05 MIL/uL — ABNORMAL LOW (ref 4.22–5.81)
RDW: 16.9 % — ABNORMAL HIGH (ref 11.5–15.5)
WBC Count: 8.1 10*3/uL (ref 4.0–10.5)
nRBC: 0 % (ref 0.0–0.2)

## 2021-11-26 LAB — IRON AND IRON BINDING CAPACITY (CC-WL,HP ONLY)
Iron: 144 ug/dL (ref 45–182)
Saturation Ratios: 107 % — ABNORMAL HIGH (ref 17.9–39.5)
TIBC: 134 ug/dL — ABNORMAL LOW (ref 250–450)
UIBC: UNDETERMINED ug/dL (ref 117–376)

## 2021-11-26 LAB — FERRITIN: Ferritin: 7209 ng/mL — ABNORMAL HIGH (ref 24–336)

## 2021-11-26 LAB — SAMPLE TO BLOOD BANK

## 2021-11-26 LAB — PREPARE RBC (CROSSMATCH)

## 2021-11-26 NOTE — Progress Notes (Signed)
CRITICAL VALUE STICKER ? ?CRITICAL VALUE: hgb 5.9 ? ?RECEIVER (on-site recipient of call): Merleen Nicely, RN ? ?DATE & TIME NOTIFIED: 11/26/21 at 1155 ? ? ?MD NOTIFIED: Nicholas Lose, MD ? ?TIME OF NOTIFICATION: 11/26/21 at 1155 ? ?RESPONSE: MD notified, verbal orders for pt to receive 2 units of PRBC's. Orders placed and pt scheduled.  ? ?

## 2021-11-26 NOTE — Assessment & Plan Note (Addendum)
Treatment plan:??Aranesp every 3 weeks 500 mcg (increased from 300 mcg)?and supportive care with blood transfusions ?? ?Lab review: ?10/01/21: Hb 8.3 ?11/26/2021: Hemoglobin 5.9 (2 units of PRBC) ?Because the patient has lots of antibodies we have to set up transfusions to base after blood is drawn. ?? ?Bone marrow biopsy: Hypercellular bone marrow for age 86 to 60% cellularity with dyspoietic changes associated with abundant ring sideroblasts. ?No increase in blastic cells identified. 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 ?Treatment plan: blood transfusions and supportive care ?If hemoglobin is 7.5 or less: 1 unit ?If hemoglobin is 6.9 or less: 2 units ?? ?Elevated ferritin: Iron overload from frequent blood transfusions:?on?Exjade. 1000 mg a day? 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 ?Return to clinic every 2 weeks for blood transfusions ?I will see the patient back in 2 months ?

## 2021-11-27 LAB — CULTURE, BLOOD (ROUTINE X 2)
Culture: NO GROWTH
Culture: NO GROWTH
Special Requests: ADEQUATE
Special Requests: ADEQUATE

## 2021-11-28 ENCOUNTER — Inpatient Hospital Stay: Payer: Medicare Other

## 2021-11-28 ENCOUNTER — Other Ambulatory Visit: Payer: Self-pay

## 2021-11-28 DIAGNOSIS — D638 Anemia in other chronic diseases classified elsewhere: Secondary | ICD-10-CM

## 2021-11-28 DIAGNOSIS — D469 Myelodysplastic syndrome, unspecified: Secondary | ICD-10-CM

## 2021-11-28 DIAGNOSIS — D649 Anemia, unspecified: Secondary | ICD-10-CM

## 2021-11-28 DIAGNOSIS — D46A Refractory cytopenia with multilineage dysplasia: Secondary | ICD-10-CM | POA: Diagnosis not present

## 2021-11-28 LAB — SAMPLE TO BLOOD BANK

## 2021-11-28 LAB — PREPARE RBC (CROSSMATCH)

## 2021-11-28 MED ORDER — ACETAMINOPHEN 325 MG PO TABS: 650.0000 mg | ORAL_TABLET | Freq: Once | ORAL | Status: DC

## 2021-11-28 MED ORDER — DIPHENHYDRAMINE HCL 25 MG PO CAPS: 25.0000 mg | ORAL_CAPSULE | Freq: Once | ORAL | Status: DC

## 2021-11-28 MED ORDER — SODIUM CHLORIDE 0.9% IV SOLUTION
250.0000 mL | Freq: Once | INTRAVENOUS | Status: AC
  Administered 2021-11-28: 250 mL via INTRAVENOUS

## 2021-11-28 NOTE — Progress Notes (Signed)
Orders placed for 2 unit RBC transfusion for 11/29/21. BB confirmed prepare and T&S orders. ?

## 2021-11-28 NOTE — Progress Notes (Signed)
Patient arrived with/dropped off by son and was due for 2 units of blood due to a hemoglobin of 5.9. Process was started, orders were released and IV was placed. Unfortunately, Patient's blood band was previously misplaced and without it no blood was able to be given today (11/28/2021). Son was called and he did not know were the band had gone. The situation was explained. The process of re-banding the patient and getting new labs was started and a new appointment was made for the following day. Ricky [name] was informed/new orders to be written. ?

## 2021-11-29 ENCOUNTER — Other Ambulatory Visit: Payer: Self-pay

## 2021-11-29 ENCOUNTER — Inpatient Hospital Stay: Payer: Medicare Other

## 2021-11-29 DIAGNOSIS — D46A Refractory cytopenia with multilineage dysplasia: Secondary | ICD-10-CM | POA: Diagnosis not present

## 2021-11-29 DIAGNOSIS — D469 Myelodysplastic syndrome, unspecified: Secondary | ICD-10-CM

## 2021-11-29 LAB — TYPE AND SCREEN
ABO/RH(D): B POS
Antibody Screen: NEGATIVE
Donor AG Type: NEGATIVE
Donor AG Type: NEGATIVE
Unit division: 0
Unit division: 0

## 2021-11-29 LAB — BPAM RBC
Blood Product Expiration Date: 202304052359
Blood Product Expiration Date: 202304072359
Unit Type and Rh: 7300
Unit Type and Rh: 7300

## 2021-11-29 MED ORDER — ACETAMINOPHEN 325 MG PO TABS
650.0000 mg | ORAL_TABLET | Freq: Once | ORAL | Status: AC
  Administered 2021-11-29: 650 mg via ORAL
  Filled 2021-11-29 (×2): qty 2

## 2021-11-29 MED ORDER — DIPHENHYDRAMINE HCL 25 MG PO CAPS
25.0000 mg | ORAL_CAPSULE | Freq: Once | ORAL | Status: AC
  Administered 2021-11-29: 25 mg via ORAL
  Filled 2021-11-29: qty 1

## 2021-11-29 MED ORDER — SODIUM CHLORIDE 0.9% IV SOLUTION
250.0000 mL | Freq: Once | INTRAVENOUS | Status: AC
  Administered 2021-11-29: 250 mL via INTRAVENOUS

## 2021-11-29 NOTE — Patient Instructions (Signed)
Blood Transfusion, Adult, Care After This sheet gives you information about how to care for yourself after your procedure. Your doctor may also give you more specific instructions. If you have problems or questions, contact your doctor. What can I expect after the procedure? After the procedure, it is common to have: Bruising and soreness at the IV site. A headache. Follow these instructions at home: Insertion site care   Follow instructions from your doctor about how to take care of your insertion site. This is where an IV tube was put into your vein. Make sure you: Wash your hands with soap and water before and after you change your bandage (dressing). If you cannot use soap and water, use hand sanitizer. Change your bandage as told by your doctor. Check your insertion site every day for signs of infection. Check for: Redness, swelling, or pain. Bleeding from the site. Warmth. Pus or a bad smell. General instructions Take over-the-counter and prescription medicines only as told by your doctor. Rest as told by your doctor. Go back to your normal activities as told by your doctor. Keep all follow-up visits as told by your doctor. This is important. Contact a doctor if: You have itching or red, swollen areas of skin (hives). You feel worried or nervous (anxious). You feel weak after doing your normal activities. You have redness, swelling, warmth, or pain around the insertion site. You have blood coming from the insertion site, and the blood does not stop with pressure. You have pus or a bad smell coming from the insertion site. Get help right away if: You have signs of a serious reaction. This may be coming from an allergy or the body's defense system (immune system). Signs include: Trouble breathing or shortness of breath. Swelling of the face or feeling warm (flushed). Fever or chills. Head, chest, or back pain. Dark pee (urine) or blood in the pee. Widespread rash. Fast  heartbeat. Feeling dizzy or light-headed. You may receive your blood transfusion in an outpatient setting. If so, you will be told whom to contact to report any reactions. These symptoms may be an emergency. Do not wait to see if the symptoms will go away. Get medical help right away. Call your local emergency services (911 in the [place] Do not drive yourself to the hospital. Summary Bruising and soreness at the IV site are common. Check your insertion site every day for signs of infection. Rest as told by your doctor. Go back to your normal activities as told by your doctor. Get help right away if you have signs of a serious reaction. This information is not intended to replace advice given to you by your health care provider. Make sure you discuss any questions you have with your health care provider. Document Revised: 01/02/2021 Document Reviewed: 03/02/2019 Elsevier Patient Education  2022 Elsevier Inc.  

## 2021-11-30 LAB — TYPE AND SCREEN
ABO/RH(D): B POS
Antibody Screen: NEGATIVE
Donor AG Type: NEGATIVE
Donor AG Type: NEGATIVE
Unit division: 0
Unit division: 0

## 2021-11-30 LAB — BPAM RBC
Blood Product Expiration Date: 202304052359
Blood Product Expiration Date: 202304072359
ISSUE DATE / TIME: 202303110834
ISSUE DATE / TIME: 202303110834
Unit Type and Rh: 7300
Unit Type and Rh: 7300

## 2021-12-01 ENCOUNTER — Other Ambulatory Visit (HOSPITAL_COMMUNITY): Payer: Self-pay

## 2021-12-03 ENCOUNTER — Other Ambulatory Visit (HOSPITAL_COMMUNITY): Payer: Self-pay

## 2021-12-05 ENCOUNTER — Other Ambulatory Visit: Payer: Medicare Other | Admitting: Hospice

## 2021-12-05 ENCOUNTER — Other Ambulatory Visit: Payer: Self-pay

## 2021-12-05 DIAGNOSIS — R531 Weakness: Secondary | ICD-10-CM

## 2021-12-05 DIAGNOSIS — D4620 Refractory anemia with excess of blasts, unspecified: Secondary | ICD-10-CM

## 2021-12-05 DIAGNOSIS — R1312 Dysphagia, oropharyngeal phase: Secondary | ICD-10-CM

## 2021-12-05 DIAGNOSIS — I482 Chronic atrial fibrillation, unspecified: Secondary | ICD-10-CM

## 2021-12-05 DIAGNOSIS — Z515 Encounter for palliative care: Secondary | ICD-10-CM

## 2021-12-05 NOTE — Progress Notes (Signed)
? ? ?Manufacturing engineer ?Community Palliative Care Consult Note ?Telephone: [phone]  ?Fax: [phone] ? ?PATIENT NAME: Ricky [name] ?DOB: 10/05/1930 ?MRN: [id number] ? ?PRIMARY CARE PROVIDER:   Roetta Sessions, NP ?Roetta Sessions, NP ?[address] ?[place],  [place] [address] ? ?REFERRING PROVIDER: Roetta Sessions, NP ?Roetta Sessions, NP ?[address] ?[place],  [place] [address] ? ?RESPONSIBLE PARTYKris No Jnr [phone] ? Extended Emergency Contact Information ?Primary Emergency Contact: Nan,Zaccheaus ?Home Phone: [phone] ?Mobile Phone: [phone] ?Relation: Son  ?Patient: [phone] ?Spouse: Emeterio Reeve  ?Contact Information   ? ? Name Relation Home Work Mobile  ? ODAY, RIDINGS Son [phone]  [phone]  ? MCGREGOR, TINNON Spouse [phone]  [phone]  ? ANIELLO, CHRISTOPOULOS Other   [phone]  ? ?  ? ? ?Visit is to build trust and highlight Palliative Medicine as specialized medical care for people living with serious illness, aimed at facilitating better quality of life through symptoms relief, assisting with advance care planning and complex medical decision making. NP called son Dary and left him a voicemail with call back number. This is a follow up visit. ? ?RECOMMENDATIONS/PLAN:  ? ?Advance Care Planning/Code Status: Patient is a Do Not Resuscitate ? ?Goals of Care: Goals of care include to maximize quality of life and symptom management. ? ?Palliative care team will continue to support patient, patient's family, and medical team. ? ?Symptom management/Plan:  ?Dysphagia: Followed by ST. Choking precautions. Continue mechanical soft diet, swallow precautions, oral care BID.  ?Myelodysplastic syndrome, with associated anemia: Continue scheduled treatment at the [facility] - PRBCs and Aranesp. Tolerating well  ?Weakness: Likely related to anemia.Optimize physical activity as tolerated; balance of  rest and performance activity. Fall precautions discussed.  ?Afib: Managed with Eliquis. ?Follow up: Palliative care will continue to follow for complex medical decision making, advance care planning, and clarification of goals. Return 6 weeks or prn. Encouraged to call provider sooner with any concerns. ?  ?CHIEF COMPLAINT: Palliative follow up visit ?  ?HISTORY OF PRESENT ILLNESS:  Ricky [name] a 88 y.o. male with Multiple morbidities requiring close monitoring/management with high risk of complications and morbidity: low-grade myelodysplastic syndrome, anemia of chronic disease, chronic fatigue, paroxysmal A. Fib, CAD, Chronic diastolic CHF.  Patient with dysphagia, followed by speech therapist-Haley.  Collaborative discussion with ST on patient's aspiration precautions and working with resident coordinator Briona to ensure selection of mechanical soft diet at meals.  Patient denied pain/discomfort, endorsed ongoing weakness. ?History obtained from review of EMR, discussion with primary team, family and/or patient. Records reviewed and summarized above. All 10 point systems reviewed and are negative except as documented in history of present illness above ? ?Review and summarization of Epic records shows history from other than patient.  ? ?Palliative Care was asked to follow this patient o help address complex decision making in the context of advance care planning and goals of care clarification.  ? ?PHYSICAL EXAM  ?General: In no acute distress, appropriately dressed ?Cardiovascular: regular rate and rhythm; no edema in BLE ?Pulmonary:  no increased work of breathing, normal respiratory effort, no adventitious lung sounds. ?Abdomen: soft, non tender, no guarding, positive bowel sounds in all quadrants ?GU:  no suprapubic tenderness ?Eyes: Normal lids, no discharge ?ENMT: Moist mucous membranes ?Musculoskeletal:  weakness, limited mobility, ambulatory with rolling walker, gets around mostly on his  wheelchair. ?Skin: no rash to visible skin, warm without cyanosis,  ?Psych:  non-anxious affect ?Neurological: Weakness but otherwise non focal ?Heme/lymph/immuno: no bruises, no bleeding ? ?PERTINENT MEDICATIONS:  ?Outpatient Encounter Medications as of 12/05/2021  ?Medication Sig  ? allopurinol (ZYLOPRIM) 100 MG tablet Take 100 mg by mouth daily.  ? cholecalciferol (VITAMIN D3) 25 MCG (1000 UNIT) tablet Take 1,000 Units by mouth daily.  ? cyanocobalamin 1000 MCG tablet Take 1,000 mcg by mouth daily.  ? ELIQUIS 5 MG TABS tablet TAKE 1 TABLET BY MOUTH TWICE A DAY  ? famotidine (PEPCID) 20 MG tablet One after supper  ? Magnesium 250 MG TABS Take 1 tablet (250 mg total) by mouth daily.  ? pantoprazole (PROTONIX) 40 MG tablet Take 1 tablet (40 mg total) by mouth daily. Take 30-60 min before first meal of the day  ? simvastatin (ZOCOR) 20 MG tablet Take 20 mg by mouth daily.  ? ?No facility-administered encounter medications on file as of 12/05/2021.  ? ? ?HOSPICE ELIGIBILITY/DIAGNOSIS: TBD ? ?PAST MEDICAL HISTORY:  ?Past Medical History:  ?Diagnosis Date  ? A-fib ([place])   ? Anemia   ? Anemia of chronic disease 02/02/2020  ? Atrial fibrillation ([place])   ? Boerhaave's syndrome   ? Chronic anticoagulation 01/22/2020  ? Colon polyps   ? COVID-19   ? Hx of colonic polyps 01/22/2020  ? 2017 - [place]  ? Hypomagnesemia   ? Low grade myelodysplastic syndrome lesions ([place]) 02/02/2020  ? Lower urinary tract symptoms due to benign prostatic hyperplasia   ? Pacemaker   ? Paroxysmal atrial fibrillation ([facility])   ? Prostate enlargement   ? Symptomatic anemia 12/26/2019  ? Vitamin D deficiency   ?  ? ?Review lab tests/diagnostics ?ALLERGIES: No Known Allergies   ? ?I spent 40 minutes providing this consultation; this includes time spent with patient/family, chart review and documentation. More than 50% of the time in this consultation was spent on counseling and coordinating communication  ? ?Thank you for the opportunity to participate in the care of  Ricky [name] Please call our office at [phone] if we can be of additional assistance. ? ?Note: Portions of this note were generated with Lobbyist. Dictation errors may occur despite best attempts at proofreading. ? ?Teodoro Spray, NP ? ?  ?

## 2021-12-10 ENCOUNTER — Other Ambulatory Visit: Payer: Self-pay

## 2021-12-10 ENCOUNTER — Other Ambulatory Visit: Payer: Self-pay | Admitting: *Deleted

## 2021-12-10 ENCOUNTER — Inpatient Hospital Stay: Payer: Medicare Other

## 2021-12-10 DIAGNOSIS — D46A Refractory cytopenia with multilineage dysplasia: Secondary | ICD-10-CM | POA: Diagnosis not present

## 2021-12-10 DIAGNOSIS — D469 Myelodysplastic syndrome, unspecified: Secondary | ICD-10-CM

## 2021-12-10 DIAGNOSIS — D649 Anemia, unspecified: Secondary | ICD-10-CM

## 2021-12-10 LAB — CBC WITH DIFFERENTIAL (CANCER CENTER ONLY)
Abs Immature Granulocytes: 0.82 10*3/uL — ABNORMAL HIGH (ref 0.00–0.07)
Basophils Absolute: 0 10*3/uL (ref 0.0–0.1)
Basophils Relative: 0 %
Eosinophils Absolute: 0.1 10*3/uL (ref 0.0–0.5)
Eosinophils Relative: 1 %
HCT: 20.5 % — ABNORMAL LOW (ref 39.0–52.0)
Hemoglobin: 6.4 g/dL — CL (ref 13.0–17.0)
Immature Granulocytes: 6 %
Lymphocytes Relative: 10 %
Lymphs Abs: 1.4 10*3/uL (ref 0.7–4.0)
MCH: 28.6 pg (ref 26.0–34.0)
MCHC: 31.2 g/dL (ref 30.0–36.0)
MCV: 91.5 fL (ref 80.0–100.0)
Monocytes Absolute: 5.5 10*3/uL — ABNORMAL HIGH (ref 0.1–1.0)
Monocytes Relative: 39 %
Neutro Abs: 6.3 10*3/uL (ref 1.7–7.7)
Neutrophils Relative %: 44 %
Platelet Count: 200 10*3/uL (ref 150–400)
RBC: 2.24 MIL/uL — ABNORMAL LOW (ref 4.22–5.81)
RDW: 16.3 % — ABNORMAL HIGH (ref 11.5–15.5)
Smear Review: NORMAL
WBC Count: 14.1 10*3/uL — ABNORMAL HIGH (ref 4.0–10.5)
nRBC: 0 % (ref 0.0–0.2)

## 2021-12-10 LAB — CMP (CANCER CENTER ONLY)
ALT: 53 U/L — ABNORMAL HIGH (ref 0–44)
AST: 26 U/L (ref 15–41)
Albumin: 2.9 g/dL — ABNORMAL LOW (ref 3.5–5.0)
Alkaline Phosphatase: 83 U/L (ref 38–126)
Anion gap: 5 (ref 5–15)
BUN: 13 mg/dL (ref 8–23)
CO2: 26 mmol/L (ref 22–32)
Calcium: 8.3 mg/dL — ABNORMAL LOW (ref 8.9–10.3)
Chloride: 105 mmol/L (ref 98–111)
Creatinine: 0.89 mg/dL (ref 0.61–1.24)
GFR, Estimated: >60 mL/min (ref >60)
Glucose, Bld: 114 mg/dL — ABNORMAL HIGH (ref 70–99)
Potassium: 3.7 mmol/L (ref 3.5–5.1)
Sodium: 136 mmol/L (ref 135–145)
Total Bilirubin: 0.7 mg/dL (ref 0.3–1.2)
Total Protein: 7.9 g/dL (ref 6.5–8.1)

## 2021-12-10 LAB — SAMPLE TO BLOOD BANK

## 2021-12-10 LAB — PREPARE RBC (CROSSMATCH)

## 2021-12-10 LAB — FERRITIN: Ferritin: 7568 ng/mL — ABNORMAL HIGH (ref 24–336)

## 2021-12-10 LAB — IRON AND IRON BINDING CAPACITY (CC-WL,HP ONLY)
Iron: 112 ug/dL (ref 45–182)
Saturation Ratios: 114 % — ABNORMAL HIGH (ref 17.9–39.5)
TIBC: 98 ug/dL — ABNORMAL LOW (ref 250–450)
UIBC: UNDETERMINED ug/dL (ref 117–376)

## 2021-12-10 NOTE — Progress Notes (Signed)
CRITICAL VALUE STICKER ? ?CRITICAL VALUE: Hgb 6.4 ? ?RECEIVER (on-site recipient of call):Taleyah Hillman, RN ? ?DATE & TIME NOTIFIED: 12/10/21 at 1030 ? ? ?MD NOTIFIED: Nicholas Lose, MD ? ?TIME OF NOTIFICATION: 12/10/21 at 1037 ? ?RESPONSE: MD notified and verbalized understanding.  Verbal orders received for pt to receive 2 units PRBC's.  Orders placed.  ? ?

## 2021-12-11 ENCOUNTER — Other Ambulatory Visit (HOSPITAL_COMMUNITY): Payer: Self-pay

## 2021-12-12 ENCOUNTER — Inpatient Hospital Stay: Payer: Medicare Other

## 2021-12-12 ENCOUNTER — Other Ambulatory Visit: Payer: Self-pay

## 2021-12-12 DIAGNOSIS — D649 Anemia, unspecified: Secondary | ICD-10-CM

## 2021-12-12 DIAGNOSIS — D46A Refractory cytopenia with multilineage dysplasia: Secondary | ICD-10-CM | POA: Diagnosis not present

## 2021-12-12 DIAGNOSIS — D638 Anemia in other chronic diseases classified elsewhere: Secondary | ICD-10-CM

## 2021-12-12 MED ORDER — SODIUM CHLORIDE 0.9% IV SOLUTION
250.0000 mL | Freq: Once | INTRAVENOUS | Status: AC
  Administered 2021-12-12: 250 mL via INTRAVENOUS

## 2021-12-12 MED ORDER — SODIUM CHLORIDE 0.9% FLUSH: 3.0000 mL | INTRAVENOUS | Status: DC | PRN

## 2021-12-12 MED ORDER — ACETAMINOPHEN 325 MG PO TABS
650.0000 mg | ORAL_TABLET | Freq: Once | ORAL | Status: AC
  Administered 2021-12-12: 650 mg via ORAL
  Filled 2021-12-12: qty 2

## 2021-12-12 MED ORDER — DIPHENHYDRAMINE HCL 25 MG PO CAPS
25.0000 mg | ORAL_CAPSULE | Freq: Once | ORAL | Status: AC
  Administered 2021-12-12: 25 mg via ORAL
  Filled 2021-12-12: qty 1

## 2021-12-12 NOTE — Patient Instructions (Signed)
Blood Transfusion, Adult, Care After This sheet gives you information about how to care for yourself after your procedure. Your doctor may also give you more specific instructions. If you have problems or questions, contact your doctor. What can I expect after the procedure? After the procedure, it is common to have: Bruising and soreness at the IV site. A headache. Follow these instructions at home: Insertion site care   Follow instructions from your doctor about how to take care of your insertion site. This is where an IV tube was put into your vein. Make sure you: Wash your hands with soap and water before and after you change your bandage (dressing). If you cannot use soap and water, use hand sanitizer. Change your bandage as told by your doctor. Check your insertion site every day for signs of infection. Check for: Redness, swelling, or pain. Bleeding from the site. Warmth. Pus or a bad smell. General instructions Take over-the-counter and prescription medicines only as told by your doctor. Rest as told by your doctor. Go back to your normal activities as told by your doctor. Keep all follow-up visits as told by your doctor. This is important. Contact a doctor if: You have itching or red, swollen areas of skin (hives). You feel worried or nervous (anxious). You feel weak after doing your normal activities. You have redness, swelling, warmth, or pain around the insertion site. You have blood coming from the insertion site, and the blood does not stop with pressure. You have pus or a bad smell coming from the insertion site. Get help right away if: You have signs of a serious reaction. This may be coming from an allergy or the body's defense system (immune system). Signs include: Trouble breathing or shortness of breath. Swelling of the face or feeling warm (flushed). Fever or chills. Head, chest, or back pain. Dark pee (urine) or blood in the pee. Widespread rash. Fast  heartbeat. Feeling dizzy or light-headed. You may receive your blood transfusion in an outpatient setting. If so, you will be told whom to contact to report any reactions. These symptoms may be an emergency. Do not wait to see if the symptoms will go away. Get medical help right away. Call your local emergency services (911 in the [place] Do not drive yourself to the hospital. Summary Bruising and soreness at the IV site are common. Check your insertion site every day for signs of infection. Rest as told by your doctor. Go back to your normal activities as told by your doctor. Get help right away if you have signs of a serious reaction. This information is not intended to replace advice given to you by your health care provider. Make sure you discuss any questions you have with your health care provider. Document Revised: 01/02/2021 Document Reviewed: 03/02/2019 Elsevier Patient Education  2022 Elsevier Inc.  

## 2021-12-13 LAB — TYPE AND SCREEN
ABO/RH(D): B POS
Antibody Screen: POSITIVE
DAT, IgG: POSITIVE
Donor AG Type: NEGATIVE
Donor AG Type: NEGATIVE
Unit division: 0
Unit division: 0

## 2021-12-13 LAB — BPAM RBC
Blood Product Expiration Date: 202304262359
Blood Product Expiration Date: 202304272359
ISSUE DATE / TIME: 202303240908
ISSUE DATE / TIME: 202303240908
Unit Type and Rh: 7300
Unit Type and Rh: 7300

## 2021-12-18 ENCOUNTER — Emergency Department (HOSPITAL_COMMUNITY): Payer: Medicare Other

## 2021-12-18 ENCOUNTER — Inpatient Hospital Stay (HOSPITAL_COMMUNITY)
Admission: EM | Admit: 2021-12-18 | Discharge: 2021-12-20 | DRG: 951 | Disposition: E | Payer: Medicare Other | Attending: Internal Medicine | Admitting: Internal Medicine

## 2021-12-18 ENCOUNTER — Other Ambulatory Visit: Payer: Self-pay

## 2021-12-18 ENCOUNTER — Encounter (HOSPITAL_COMMUNITY): Payer: Self-pay | Admitting: Emergency Medicine

## 2021-12-18 DIAGNOSIS — J189 Pneumonia, unspecified organism: Secondary | ICD-10-CM | POA: Diagnosis present

## 2021-12-18 DIAGNOSIS — J9601 Acute respiratory failure with hypoxia: Secondary | ICD-10-CM | POA: Diagnosis present

## 2021-12-18 DIAGNOSIS — Z6372 Alcoholism and drug addiction in family: Secondary | ICD-10-CM | POA: Diagnosis not present

## 2021-12-18 DIAGNOSIS — Z811 Family history of alcohol abuse and dependence: Secondary | ICD-10-CM

## 2021-12-18 DIAGNOSIS — Z515 Encounter for palliative care: Principal | ICD-10-CM

## 2021-12-18 DIAGNOSIS — Z7901 Long term (current) use of anticoagulants: Secondary | ICD-10-CM | POA: Diagnosis not present

## 2021-12-18 DIAGNOSIS — Z950 Presence of cardiac pacemaker: Secondary | ICD-10-CM | POA: Diagnosis not present

## 2021-12-18 DIAGNOSIS — Z87891 Personal history of nicotine dependence: Secondary | ICD-10-CM

## 2021-12-18 DIAGNOSIS — Z8719 Personal history of other diseases of the digestive system: Secondary | ICD-10-CM | POA: Diagnosis not present

## 2021-12-18 DIAGNOSIS — A419 Sepsis, unspecified organism: Secondary | ICD-10-CM | POA: Diagnosis present

## 2021-12-18 DIAGNOSIS — Z789 Other specified health status: Secondary | ICD-10-CM

## 2021-12-18 DIAGNOSIS — Z8616 Personal history of COVID-19: Secondary | ICD-10-CM | POA: Diagnosis not present

## 2021-12-18 DIAGNOSIS — D638 Anemia in other chronic diseases classified elsewhere: Secondary | ICD-10-CM | POA: Diagnosis present

## 2021-12-18 DIAGNOSIS — R569 Unspecified convulsions: Secondary | ICD-10-CM | POA: Diagnosis present

## 2021-12-18 DIAGNOSIS — Z7189 Other specified counseling: Secondary | ICD-10-CM | POA: Diagnosis not present

## 2021-12-18 DIAGNOSIS — Z8249 Family history of ischemic heart disease and other diseases of the circulatory system: Secondary | ICD-10-CM

## 2021-12-18 DIAGNOSIS — D469 Myelodysplastic syndrome, unspecified: Secondary | ICD-10-CM | POA: Diagnosis present

## 2021-12-18 DIAGNOSIS — K223 Perforation of esophagus: Secondary | ICD-10-CM | POA: Diagnosis present

## 2021-12-18 DIAGNOSIS — Z833 Family history of diabetes mellitus: Secondary | ICD-10-CM | POA: Diagnosis not present

## 2021-12-18 DIAGNOSIS — I480 Paroxysmal atrial fibrillation: Secondary | ICD-10-CM | POA: Diagnosis present

## 2021-12-18 DIAGNOSIS — R0682 Tachypnea, not elsewhere classified: Secondary | ICD-10-CM

## 2021-12-18 DIAGNOSIS — Z808 Family history of malignant neoplasm of other organs or systems: Secondary | ICD-10-CM | POA: Diagnosis not present

## 2021-12-18 DIAGNOSIS — R4182 Altered mental status, unspecified: Secondary | ICD-10-CM | POA: Diagnosis not present

## 2021-12-18 DIAGNOSIS — Z96641 Presence of right artificial hip joint: Secondary | ICD-10-CM | POA: Diagnosis present

## 2021-12-18 DIAGNOSIS — N400 Enlarged prostate without lower urinary tract symptoms: Secondary | ICD-10-CM | POA: Diagnosis present

## 2021-12-18 DIAGNOSIS — H04129 Dry eye syndrome of unspecified lacrimal gland: Secondary | ICD-10-CM | POA: Diagnosis present

## 2021-12-18 DIAGNOSIS — Z66 Do not resuscitate: Secondary | ICD-10-CM | POA: Diagnosis present

## 2021-12-18 DIAGNOSIS — R0603 Acute respiratory distress: Principal | ICD-10-CM

## 2021-12-18 DIAGNOSIS — Z6825 Body mass index (BMI) 25.0-25.9, adult: Secondary | ICD-10-CM

## 2021-12-18 DIAGNOSIS — R54 Age-related physical debility: Secondary | ICD-10-CM | POA: Diagnosis present

## 2021-12-18 DIAGNOSIS — Z96652 Presence of left artificial knee joint: Secondary | ICD-10-CM | POA: Diagnosis present

## 2021-12-18 DIAGNOSIS — D72829 Elevated white blood cell count, unspecified: Secondary | ICD-10-CM

## 2021-12-18 DIAGNOSIS — Z79899 Other long term (current) drug therapy: Secondary | ICD-10-CM

## 2021-12-18 LAB — COMPREHENSIVE METABOLIC PANEL
ALT: 61 U/L — ABNORMAL HIGH (ref 0–44)
AST: 38 U/L (ref 15–41)
Albumin: 2.1 g/dL — ABNORMAL LOW (ref 3.5–5.0)
Alkaline Phosphatase: 70 U/L (ref 38–126)
Anion gap: 7 (ref 5–15)
BUN: 20 mg/dL (ref 8–23)
CO2: 24 mmol/L (ref 22–32)
Calcium: 7.9 mg/dL — ABNORMAL LOW (ref 8.9–10.3)
Chloride: 107 mmol/L (ref 98–111)
Creatinine, Ser: 0.87 mg/dL (ref 0.61–1.24)
GFR, Estimated: >60 mL/min (ref >60)
Glucose, Bld: 121 mg/dL — ABNORMAL HIGH (ref 70–99)
Potassium: 3.6 mmol/L (ref 3.5–5.1)
Sodium: 138 mmol/L (ref 135–145)
Total Bilirubin: 0.9 mg/dL (ref 0.3–1.2)
Total Protein: 8.1 g/dL (ref 6.5–8.1)

## 2021-12-18 LAB — CBC WITH DIFFERENTIAL/PLATELET
Abs Immature Granulocytes: 1.05 10*3/uL — ABNORMAL HIGH (ref 0.00–0.07)
Basophils Absolute: 0 10*3/uL (ref 0.0–0.1)
Basophils Relative: 0 %
Eosinophils Absolute: 0 10*3/uL (ref 0.0–0.5)
Eosinophils Relative: 0 %
HCT: 22.9 % — ABNORMAL LOW (ref 39.0–52.0)
Hemoglobin: 7.4 g/dL — ABNORMAL LOW (ref 13.0–17.0)
Immature Granulocytes: 5 %
Lymphocytes Relative: 5 %
Lymphs Abs: 1.1 10*3/uL (ref 0.7–4.0)
MCH: 29.5 pg (ref 26.0–34.0)
MCHC: 32.3 g/dL (ref 30.0–36.0)
MCV: 91.2 fL (ref 80.0–100.0)
Monocytes Absolute: 5.8 10*3/uL — ABNORMAL HIGH (ref 0.1–1.0)
Monocytes Relative: 27 %
Neutro Abs: 13.1 10*3/uL — ABNORMAL HIGH (ref 1.7–7.7)
Neutrophils Relative %: 63 %
Platelets: ADEQUATE 10*3/uL (ref 150–400)
RBC: 2.51 MIL/uL — ABNORMAL LOW (ref 4.22–5.81)
RDW: 15.5 % (ref 11.5–15.5)
Smear Review: ADEQUATE
WBC: 21.2 10*3/uL — ABNORMAL HIGH (ref 4.0–10.5)
nRBC: 0 % (ref 0.0–0.2)

## 2021-12-18 LAB — LACTIC ACID, PLASMA: Lactic Acid, Venous: 0.8 mmol/L (ref 0.5–1.9)

## 2021-12-18 LAB — TROPONIN I (HIGH SENSITIVITY): Troponin I (High Sensitivity): 29 ng/L — ABNORMAL HIGH (ref <18)

## 2021-12-18 MED ORDER — MORPHINE 100MG IN NS 100ML (1MG/ML) PREMIX INFUSION
INTRAVENOUS | Status: AC
  Filled 2021-12-18: qty 100

## 2021-12-18 MED ORDER — LORAZEPAM 1 MG PO TABS: 1.0000 mg | ORAL_TABLET | ORAL | Status: DC | PRN

## 2021-12-18 MED ORDER — DIPHENHYDRAMINE HCL 50 MG/ML IJ SOLN: 12.5000 mg | INTRAMUSCULAR | Status: DC | PRN

## 2021-12-18 MED ORDER — GLYCOPYRROLATE 1 MG PO TABS
1.0000 mg | ORAL_TABLET | ORAL | Status: DC | PRN
Start: 2021-12-18 — End: 2021-12-19
  Filled 2021-12-18: qty 1

## 2021-12-18 MED ORDER — ACETAMINOPHEN 650 MG RE SUPP: 650.0000 mg | Freq: Four times a day (QID) | RECTAL | Status: DC | PRN

## 2021-12-18 MED ORDER — LORAZEPAM 2 MG/ML IJ SOLN: 1.0000 mg | INTRAMUSCULAR | Status: DC | PRN

## 2021-12-18 MED ORDER — HALOPERIDOL LACTATE 2 MG/ML PO CONC
2.0000 mg | Freq: Four times a day (QID) | ORAL | Status: DC | PRN
  Filled 2021-12-18: qty 1

## 2021-12-18 MED ORDER — GLYCOPYRROLATE 0.2 MG/ML IJ SOLN: 0.2000 mg | INTRAMUSCULAR | Status: DC | PRN

## 2021-12-18 MED ORDER — ONDANSETRON HCL 4 MG/2ML IJ SOLN: 4.0000 mg | Freq: Four times a day (QID) | INTRAMUSCULAR | Status: DC | PRN

## 2021-12-18 MED ORDER — LORAZEPAM 2 MG/ML PO CONC: 1.0000 mg | ORAL | Status: DC | PRN

## 2021-12-18 MED ORDER — MORPHINE SULFATE (PF) 2 MG/ML IV SOLN
2.0000 mg | Freq: Once | INTRAVENOUS | Status: AC
  Administered 2021-12-18: 2 mg via INTRAVENOUS

## 2021-12-18 MED ORDER — MORPHINE 100MG IN NS 100ML (1MG/ML) PREMIX INFUSION
2.0000 mg/h | INTRAVENOUS | Status: DC
  Administered 2021-12-18: 2 mg/h via INTRAVENOUS

## 2021-12-18 MED ORDER — POLYVINYL ALCOHOL 1.4 % OP SOLN
1.0000 [drp] | Freq: Four times a day (QID) | OPHTHALMIC | Status: DC | PRN
  Filled 2021-12-18: qty 15

## 2021-12-18 MED ORDER — ACETAMINOPHEN 325 MG PO TABS: 650.0000 mg | ORAL_TABLET | Freq: Four times a day (QID) | ORAL | Status: DC | PRN

## 2021-12-18 MED ORDER — MORPHINE BOLUS VIA INFUSION
1.0000 mg | INTRAVENOUS | Status: DC | PRN
  Administered 2021-12-18: 2 mg via INTRAVENOUS
  Filled 2021-12-18: qty 3

## 2021-12-18 MED ORDER — MORPHINE SULFATE (PF) 2 MG/ML IV SOLN
INTRAVENOUS | Status: AC
  Filled 2021-12-18: qty 1

## 2021-12-18 MED ORDER — HALOPERIDOL 1 MG PO TABS
2.0000 mg | ORAL_TABLET | Freq: Four times a day (QID) | ORAL | Status: DC | PRN
  Filled 2021-12-18: qty 2

## 2021-12-18 MED ORDER — HALOPERIDOL LACTATE 5 MG/ML IJ SOLN: 2.0000 mg | Freq: Four times a day (QID) | INTRAMUSCULAR | Status: DC | PRN

## 2021-12-18 MED ORDER — BIOTENE DRY MOUTH MT LIQD: 15.0000 mL | Freq: Two times a day (BID) | OROMUCOSAL | Status: DC

## 2021-12-18 MED ORDER — ONDANSETRON 4 MG PO TBDP: 4.0000 mg | ORAL_TABLET | Freq: Four times a day (QID) | ORAL | Status: DC | PRN

## 2021-12-20 NOTE — ED Notes (Signed)
Hal Hope MD notified of expire.  ?

## 2021-12-20 NOTE — Progress Notes (Signed)
?  01-09-2022 2000  ?Clinical Encounter Type  ?Visited With Family;Patient not available  ?Visit Type Initial;Death;Spiritual support;Social support  ?Referral From Nurse  ?Consult/Referral To Chaplain  ? ?Chaplain responded to a call for family support due to the death of a loved one.  ?I met with the family in the patient's room. Son, Aldon shared his father's life and that his parents had recently moved to [place]. Spouse of the deceased is actively grieving and reluctant to leave her husband's side.  ? ?The son provided the funeral home information to me ?Richrd Humbles [phone]  ? ?Danice Goltz  ?Chaplain Resident  ?[facility]  ?[phone] ?

## 2021-12-20 NOTE — ED Triage Notes (Signed)
Patient BIB [facility] from [facility] after an aide this morning found him dyspneic and called 911. Patient is responsive, tachypneic with RR 40, and ill-appearing. EMS reports initial room air SpO2 80% on scene. Patient has DNR and MOST form at bedside.  ?

## 2021-12-20 NOTE — Assessment & Plan Note (Addendum)
-  Patient presenting with acute respiratory distress, likely associated with sepsis from pneumonia in the setting of underlying MDS ?-After discussion in the ER, family decided to proceed with comfort care only ?-He was admitted to [facility] for comfort care and palliative care consult ?-Comfort care order set utilized ?-Pain control with morphine drip ?-Patient died in the evening after admission ?

## 2021-12-20 NOTE — ED Notes (Signed)
Patient is no longer dyspneic, nasal cannula removed per order. ?

## 2021-12-20 NOTE — ED Notes (Signed)
Pt moved to room 39, monitor placed on comfort. RN introduced self to wife and son. RN provided recliner to family.  ?

## 2021-12-20 NOTE — H&P (Signed)
?History and Physical  ? ? ?Patient: Ricky [name]:[id number] DOB: 20-May-1931 ?DOA: 01-02-22 ?DOS: the patient was seen and examined on 01/02/22 ?PCP: Roetta Sessions, NP  ?Patient coming from: [facility]- lives with wife; NOK: Alfreddie, [place], [phone] ? ? ?Chief Complaint: Respiratory distress ? ?HPI: ORON WESTRUP is a 88 y.o. male with medical history significant of afib, dysphagia, and MDS presenting with respiratory distress.  His son reports generalized weakness that has been progressive.  He has MDS and gets transfusions and that often gives him a boost.  However, he has not bounced back since his last transfusion.  Hgb was 5.9 on 3/8 and he was transfused 2 units on 3/11.  He was seen by palliative care on 3/17 and it doesn't appear that any significant changes were made.  Hgb was 6.4 on 3/22 and he was ordered for 2 additional units.  He has been increasingly SOB and this AM was in respiratory distress.  He was sent to the ER and his son, at the bedside, confirmed that he is ready to transition to comfort measures only. ? ? ? ?ER Course:  Son at bedside, needs admission for comfort care.  Respiratory distress this AM.  Struggling to breathe.  On 3-4L [place] O2.   ? ? ? ? ?Review of Systems: unable to review all systems due to the inability of the patient to answer questions. ?Past Medical History:  ?Diagnosis Date  ? Anemia   ? Anemia of chronic disease 02/02/2020  ? Boerhaave's syndrome   ? Chronic anticoagulation 01/22/2020  ? Colon polyps   ? COVID-19   ? Hx of colonic polyps 01/22/2020  ? 2017 - [place]  ? Hypomagnesemia   ? Low grade myelodysplastic syndrome lesions ([place]) 02/02/2020  ? Lower urinary tract symptoms due to benign prostatic hyperplasia   ? Pacemaker   ? Paroxysmal atrial fibrillation ([facility])   ? Prostate enlargement   ? Symptomatic anemia 12/26/2019  ? Vitamin D deficiency   ? ?Past Surgical History:  ?Procedure Laterality Date  ? BRONCHIAL  BIOPSY  12/16/2020  ? Procedure: BRONCHIAL BIOPSIES;  Surgeon: Juanito Doom, MD;  Location: [facility];  Service: Cardiopulmonary;;  ? BRONCHIAL BRUSHINGS  12/16/2020  ? Procedure: BRONCHIAL BRUSHINGS;  Surgeon: Juanito Doom, MD;  Location[facility] ENDOSCOPY;  Service: Cardiopulmonary;;  ? esophogeal tear repair    ? FINE NEEDLE ASPIRATION  12/16/2020  ? Procedure: FINE NEEDLE ASPIRATION (FNA) LINEAR;  Surgeon: Juanito Doom, MD;  Location: [facility] ENDOSCOPY;  Service: Cardiopulmonary;;  ? PACEMAKER INSERTION  2009, 2017  ? THORACOTOMY    ? esophageal perforation  ? TOTAL HIP ARTHROPLASTY Right   ? TOTAL KNEE ARTHROPLASTY Left   ? VIDEO BRONCHOSCOPY WITH ENDOBRONCHIAL ULTRASOUND Left 12/16/2020  ? Procedure: VIDEO BRONCHOSCOPY WITH ENDOBRONCHIAL ULTRASOUND;  Surgeon: Juanito Doom, MD;  Location: [facility];  Service: Cardiopulmonary;  Laterality: Left;  ? ?Social History:  reports that he quit smoking about 51 years ago. His smoking use included cigarettes. He has never used smokeless tobacco. He reports that he does not currently use alcohol. He reports that he does not use drugs. ? ?No Known Allergies ? ?Family History  ?Problem Relation Age of Onset  ? Cirrhosis Mother   ? Alcoholism Mother   ? Heart attack Father 37  ? Heart disease Brother   ? Diabetes Brother   ? Brain cancer Daughter   ? ? ?Prior to Admission medications   ?Medication Sig  Start Date End Date Taking? Authorizing Provider  ?allopurinol (ZYLOPRIM) 100 MG tablet Take 100 mg by mouth daily. 08/10/21   [provider]  ?cholecalciferol (VITAMIN D3) 25 MCG (1000 UNIT) tablet Take 1,000 Units by mouth daily.    [provider]  ?cyanocobalamin 1000 MCG tablet Take 1,000 mcg by mouth daily.    [provider]  ?ELIQUIS 5 MG TABS tablet TAKE 1 TABLET BY MOUTH TWICE A DAY 11/18/21   Nahser, Wonda Cheng, MD  ?famotidine (PEPCID) 20 MG tablet One after supper 10/08/21   Tanda Rockers, MD  ?Magnesium 250 MG TABS Take 1  tablet (250 mg total) by mouth daily. 12/27/19   Barb Merino, MD  ?pantoprazole (PROTONIX) 40 MG tablet Take 1 tablet (40 mg total) by mouth daily. Take 30-60 min before first meal of the day 10/08/21   Tanda Rockers, MD  ?simvastatin (ZOCOR) 20 MG tablet Take 20 mg by mouth daily.    [provider]  ? ? ?Physical Exam: ?Vitals:  ? 01/11/2022 1000 11-Jan-2022 1100 Jan 11, 2022 1254 11-Jan-2022 1457  ?BP: (!) 151/80 (!) 152/66    ?Pulse: (!) 113 100  (!) 105  ?Resp: (!) 39 (!) 32 18 (!) 22  ?Temp:      ?SpO2: 93% 93%  (!) 84%  ? ?General:  Appears fatigued and acutely on chronically ill, persistently increased WOB despite [place] O2 ?Eyes:   normal lids, iris ?ENT:  grossly normal lips & tongue ?Neck:  no LAD, masses or thyromegaly ?Cardiovascular:  RR with tachycardia, no m/r/g. No LE edema.  ?Respiratory:   Diffuse rhonchi.  Increased respiratory effort despite morphine and [place] O2. ?Abdomen:  soft, NT, ND ?Skin:  no rash or induration seen on limited exam ?Musculoskeletal:  no bony abnormality ?Psychiatric:  sedated, ill ?Neurologic:  unable to effectively perform ? ? ?Radiological Exams on Admission: ?Independently reviewed - see discussion in A/P where applicable ? ?DG Chest Port 1 View ? ?Result Date: 2022-01-11 ?CLINICAL DATA:  Sepsis. EXAM: PORTABLE CHEST 1 VIEW COMPARISON:  November 22, 2021. FINDINGS: Stable cardiomediastinal silhouette. Left-sided pacemaker is unchanged in position. Increased patchy airspace opacities are noted throughout both lungs concerning for multifocal pneumonia or less likely edema. Bony thorax is unremarkable. IMPRESSION: Increased patchy airspace opacities are noted bilaterally concerning for multifocal pneumonia or possibly edema. Electronically Signed   By: Marijo Conception M.D.   On: 01/11/22 10:51   ? ?EKG: Independently reviewed.  Sinus tachycardia with rate 111;  no evidence of acute ischemia ? ? ?Labs on Admission: I have personally reviewed the available labs and imaging studies at  the time of the admission. ? ?Pertinent labs:   ? ?Glucose 121 ?Albumin 2.1 ?HS troponin 29 ?WBC 21.2 ?Hgb 7.4, 6.4 on 3/22 ? ? ? ?Assessment and Plan: ?* Admission for end of life care ?-Patient presenting with acute respiratory distress, likely associated with sepsis from pneumonia in the setting of underlying MDS ?-After discussion in the ER, family has decided to proceed with comfort care only ?-He will be admitted to [facility] for comfort care and palliative care consult ?-Patient may be a candidate for [facility] or other residential hospice ?-However, his reserve is likely quite low given his age and overall frailty and in-hospital demise is anticipated ?-Comfort care order set utilized ?-Pain control with morphine drip ? ? ?Anticipated cause of death: ?Admission for comfort care ?Resulting from sepsis due to PNA ?Resulting from Myelodysplastic Syndrome ? ? ? ? ?Advance Care  Planning:   Code Status: DNR  ? ?Consults: Palliative care ? ?DVT Prophylaxis: None ? ?Family Communication: Son was present throughout evaluation ? ?Severity of Illness: ?The appropriate patient status for this patient is INPATIENT. Inpatient status is judged to be reasonable and necessary in order to provide the required intensity of service to ensure the patient's safety. The patient's presenting symptoms, physical exam findings, and initial radiographic and laboratory data in the context of their chronic comorbidities is felt to place them at high risk for further clinical deterioration. Furthermore, it is not anticipated that the patient will be medically stable for discharge from the hospital within 2 midnights of admission.  ? ?* I certify that at the point of admission it is my clinical judgment that the patient will require inpatient hospital care spanning beyond 2 midnights from the point of admission due to high intensity of service, high risk for further deterioration and high frequency of surveillance  required.* ? ?Author: ?Karmen Bongo, MD ?12-24-2021 3:03 PM ? ?For on call review [email or web address].  ?

## 2021-12-20 NOTE — ED Notes (Signed)
RN paged Hal Hope MD regarding pt current status. Pt not responding to painful stimuli, no rise or fall of the chest noted. Pt has no rhythm on the monitor at the current time but pacing spikes noted. Pt does have pacemaker and was advised by Hal Hope MD that RN may pronounce death and deactivate the pacemaker via magnet or calling the pacemaker company.  ?

## 2021-12-20 NOTE — ED Provider Notes (Signed)
?[facility] ?Provider Note ? ? ?CSN: [id number] ?Arrival date & time: 02-Jan-2022  7412 ? ?  ? ?History ? ?Chief Complaint  ?Patient presents with  ? Shortness of Breath  ? ? ?Ricky [name] is a 88 y.o. male.  Presented to the emergency room with concern for shortness of breath.  Per EMS report, from [facility], patient found to be dyspneic, lethargic.  Initial SPO2 80% on room air.  Patient has DNR/MOST. ? ?Discussed history with son via phone, son reports patient was feeling somewhat short of breath yesterday but was not in distress.  States that he has had significant functional decline over the last month, more lethargic lately, sleeping more often, no longer mobile. ? ?HPI ? ?  ? ?Home Medications ?Prior to Admission medications   ?Medication Sig Start Date End Date Taking? Authorizing Provider  ?allopurinol (ZYLOPRIM) 100 MG tablet Take 100 mg by mouth daily. 08/10/21   [provider]  ?cholecalciferol (VITAMIN D3) 25 MCG (1000 UNIT) tablet Take 1,000 Units by mouth daily.    [provider]  ?cyanocobalamin 1000 MCG tablet Take 1,000 mcg by mouth daily.    [provider]  ?ELIQUIS 5 MG TABS tablet TAKE 1 TABLET BY MOUTH TWICE A DAY 11/18/21   Nahser, Wonda Cheng, MD  ?famotidine (PEPCID) 20 MG tablet One after supper 10/08/21   Tanda Rockers, MD  ?Magnesium 250 MG TABS Take 1 tablet (250 mg total) by mouth daily. 12/27/19   Barb Merino, MD  ?pantoprazole (PROTONIX) 40 MG tablet Take 1 tablet (40 mg total) by mouth daily. Take 30-60 min before first meal of the day 10/08/21   Tanda Rockers, MD  ?simvastatin (ZOCOR) 20 MG tablet Take 20 mg by mouth daily.    [provider]  ?   ? ?Allergies    ?Patient has no known allergies.   ? ?Review of Systems   ?Review of Systems  ?Unable to perform ROS: Acuity of condition  ? ?Physical Exam ?Updated Vital Signs ?BP (!) 144/67   Pulse (!) 105   Temp (!) 100.6 ?F (38.1 ?C)   Resp (!) 22    SpO2 (!) 84%  ?Physical Exam ?Constitutional:   ?   Comments: Patient is lethargic, obvious respiratory distress  ?HENT:  ?   Head: Normocephalic and atraumatic.  ?Eyes:  ?   Pupils: Pupils are equal, round, and reactive to light.  ?Neck:  ?   Thyroid: No thyromegaly.  ?Cardiovascular:  ?   Rate and Rhythm: Tachycardia present.  ?Pulmonary:  ?   Comments: Obvious respiratory distress, tachypnea, no significant wheeze noted bilaterally ?Chest:  ?   Chest wall: No tenderness or crepitus.  ?Abdominal:  ?   Palpations: Abdomen is soft. There is no mass.  ?   Tenderness: There is no abdominal tenderness.  ?Musculoskeletal:     ?   General: Normal range of motion.  ?   Cervical back: Normal range of motion.  ?   Right lower leg: No edema.  ?   Left lower leg: No edema.  ?Skin: ?   General: Skin is warm and dry.  ?Neurological:  ?   Comments: Lethargic, opens eyes to voice, follows commands in all 4 extremities, responds to basic questions with very short phrase  ? ? ?ED Results / Procedures / Treatments   ?Labs ?(all labs ordered are listed, but only abnormal results are displayed) ?Labs Reviewed  ?COMPREHENSIVE METABOLIC PANEL -  Abnormal; Notable for the following components:  ?    Result Value  ? Glucose, Bld 121 (*)   ? Calcium 7.9 (*)   ? Albumin 2.1 (*)   ? ALT 61 (*)   ? All other components within normal limits  ?CBC WITH DIFFERENTIAL/PLATELET - Abnormal; Notable for the following components:  ? WBC 21.2 (*)   ? RBC 2.51 (*)   ? Hemoglobin 7.4 (*)   ? HCT 22.9 (*)   ? Neutro Abs 13.1 (*)   ? Monocytes Absolute 5.8 (*)   ? Abs Immature Granulocytes 1.05 (*)   ? All other components within normal limits  ?TROPONIN I (HIGH SENSITIVITY) - Abnormal; Notable for the following components:  ? Troponin I (High Sensitivity) 29 (*)   ? All other components within normal limits  ?CULTURE, BLOOD (ROUTINE X 2)  ?LACTIC ACID, PLASMA  ?PATHOLOGIST SMEAR REVIEW  ? ? ?EKG ?EKG Interpretation ? ?Date/Time:  12-24-2021  09:46:47 EDT ?Ventricular Rate:  111 ?PR Interval:  50 ?QRS Duration: 112 ?QT Interval:  345 ?QTC Calculation: 469 ?R Axis:   -20 ?Text Interpretation: Sinus tachycardia Atrial premature complexes Incomplete RBBB and LAFB Confirmed by Madalyn Rob [phone]) on 2021/12/24 10:12:05 AM ? ?Radiology ?DG Chest Port 1 View ? ?Result Date: December 24, 2021 ?CLINICAL DATA:  Sepsis. EXAM: PORTABLE CHEST 1 VIEW COMPARISON:  November 22, 2021. FINDINGS: Stable cardiomediastinal silhouette. Left-sided pacemaker is unchanged in position. Increased patchy airspace opacities are noted throughout both lungs concerning for multifocal pneumonia or less likely edema. Bony thorax is unremarkable. IMPRESSION: Increased patchy airspace opacities are noted bilaterally concerning for multifocal pneumonia or possibly edema. Electronically Signed   By: Marijo Conception M.D.   On: 2021-12-24 10:51   ? ?Procedures ?[facility] KitchenCritical Care ?Performed by: Lucrezia Starch, MD ?Authorized by: Lucrezia Starch, MD  ? ?Critical care provider statement:  ?  Critical care time (minutes):  52 ?  Critical care was necessary to treat or prevent imminent or life-threatening deterioration of the following conditions:  Respiratory failure, sepsis and CNS failure or compromise ?  Critical care was time spent personally by me on the following activities:  Development of treatment plan with patient or surrogate, discussions with consultants, evaluation of patient's response to treatment, examination of patient, ordering and review of laboratory studies, ordering and review of radiographic studies, ordering and performing treatments and interventions, pulse oximetry, re-evaluation of patient's condition and review of old charts  ? ? ?Medications Ordered in ED ?Medications  ?acetaminophen (TYLENOL) tablet 650 mg (has no administration in time range)  ?  Or  ?acetaminophen (TYLENOL) suppository 650 mg (has no administration in time range)  ?LORazepam (ATIVAN) tablet 1 mg (has  no administration in time range)  ?  Or  ?LORazepam (ATIVAN) 2 MG/ML concentrated solution 1 mg (has no administration in time range)  ?  Or  ?LORazepam (ATIVAN) injection 1 mg (has no administration in time range)  ?haloperidol (HALDOL) tablet 2 mg (has no administration in time range)  ?  Or  ?haloperidol (HALDOL) 2 MG/ML solution 2 mg (has no administration in time range)  ?  Or  ?haloperidol lactate (HALDOL) injection 2 mg (has no administration in time range)  ?diphenhydrAMINE (BENADRYL) injection 12.5 mg (has no administration in time range)  ?ondansetron (ZOFRAN-ODT) disintegrating tablet 4 mg (has no administration in time range)  ?  Or  ?ondansetron (ZOFRAN) injection 4 mg (has no administration in time range)  ?glycopyrrolate (ROBINUL) tablet 1  mg (has no administration in time range)  ?  Or  ?glycopyrrolate (ROBINUL) injection 0.2 mg (has no administration in time range)  ?  Or  ?glycopyrrolate (ROBINUL) injection 0.2 mg (has no administration in time range)  ?antiseptic oral rinse (BIOTENE) solution 15 mL (has no administration in time range)  ?polyvinyl alcohol (LIQUIFILM TEARS) 1.4 % ophthalmic solution 1 drop (has no administration in time range)  ?morphine bolus via infusion 1-3 mg (2 mg Intravenous Bolus from Bag Jan 16, 2022 1136)  ?morphine '100mg'$  in NS 128m ('1mg'$ /mL) infusion - premix (2 mg/hr Intravenous Infusion Verify 304/28/231136)  ?morphine (PF) 2 MG/ML injection 2 mg (2 mg Intravenous Given 32023/04/281123)  ? ? ?ED Course/ Medical Decision Making/ A&P ?  ?                        ?Medical Decision Making ?Amount and/or Complexity of Data Reviewed ?Labs: ordered. ?Radiology: ordered. ?ECG/medicine tests: ordered. ? ?Risk ?Decision regarding hospitalization. ? ? ?88year old gentleman arriving to ER in obvious respiratory distress, ill-appearing.  Very tachypneic, tachycardic on simple facemask by EMS.  DNR, MOST form accompanying patient.  I contacted patient's son, also named Ed.  Discussed  patient's current clinical presentation.  Son informs me that patient has had significant functional decline over the last month or so, he saw patient last night and he seemed to be having some difficulty breathing.

## 2021-12-20 NOTE — ED Provider Notes (Signed)
Brief update note ? ?88 year old gentleman arriving to ER in obvious respiratory distress, ill-appearing.  Very tachypneic, tachycardic on simple facemask by EMS.  DNR, MOST form accompanying patient.  I contacted patient's son, also named Ed.  Discussed patient's current clinical presentation.  Son informs me that patient has had significant functional decline over the last month or so, he saw patient last night and he seemed to be having some difficulty breathing.  I discussed my concern for patient's very poor prognosis based on his current physical exam and patient's underlying medical conditions.  Patient's son states that would like goals of care to be focused on comfort measures only.  He is in route to hospital.  Will consult palliative care.  Have placed basic orders and chest x-ray to get some baseline information for further discussions but based on goals of care, will hold off on aggressive resuscitation measures. ? ?Plan: ?DNR/DNI ?Comfort measures ?Consult palliative ?  ?Lucrezia Starch, MD ?12/28/21 1003 ? ?

## 2021-12-20 NOTE — Consult Note (Signed)
?Consultation Note ?Date: 2022/01/15  ? ?Patient Name: Ricky [name]  ?DOB: 1930-11-21  MRN: [id number]  Age / Sex: 88 y.o., male  ?PCP: Ricky Sessions, NP ?Referring Physician: Lucrezia Starch, MD ? ?Reason for Consultation: Establishing goals of care and Terminal Care ? ?HPI/Patient Profile: 88 y.o. male  with past medical history of anemia of chronic disease, Boerhaave syndrome, colon polyps, s/p pacemaker, paroxysmal atrial fibrillation presented to the ED on 01-15-22 from [facility] with staff concerns of dyspnea and lethargy.  EDP spoke with patient's son who endorses patient has had a significant functional decline over the last month, sleeping more often, increasing lethargy.  After discussion son/HCPOA is most interested in full comfort measures.  PMT was consulted to assist with GOC/end-of-life. ? ? ?Clinical Assessment and Goals of Care: ?I have reviewed medical records including EPIC notes, labs, and imaging. Received report from primary RN - no acute concerns. ? ?Went to visit patient at bedside -son/HCPOA/Ed present. Patient was lying in bed  -he does not wake to voice/gentle touch. Signs and non-verbal gestures discomfort noted.  Patient is tachypneic with increased work of breathing; no respiratory secretions.  He is on 3 L O2 nasal cannula. ? ?Met with son  to discuss diagnosis, prognosis, GOC, EOL wishes, disposition, and options. ? ?I introduced Palliative Medicine as specialized medical care for people living with serious illness. It focuses on providing relief from the symptoms and stress of a serious illness. The goal is to improve quality of life for both the patient and the family. ? ?Emotional support provided to son. ? ?We briefly discussed a brief life review of the patient as well as functional and nutritional status.  Son confirms patient's functional decline over the last month as well as  significant decrease in his oral intake.  Albumin noted to be 2.1 on 2022-01-15. ? ?We discussed patient's current illness and what it means in the larger context of patient's on-going co-morbidities.  Natural disease trajectory and expectations at EOL were discussed. I attempted to elicit values and goals of care important to the patient. The difference between aggressive medical intervention and comfort care was considered in light of the patient's goals of care.  Son is clear that goal at this time is for full comfort care. ? ?We talked about transition to comfort measures in house and what that would entail inclusive of medications to control pain, dyspnea, agitation, nausea, and itching. We discussed stopping all unnecessary measures such as blood draws, needle sticks, oxygen, antibiotics, CBGs/insulin, cardiac monitoring, IVF, and frequent vital signs. Son is agreeable. ? ?Due to current significant symptom burden patient is not stable for transfer to residential hospice at this time.  Son is agreeable for patient to remain in house for end-of-life care.  If patient survives the night family may be interested in transfer to residential hospice tomorrow pending his stability. ? ?Visit also consisted of discussions dealing with the complex and emotionally intense issues of symptom management and palliative care in the setting of serious and life-threatening illness.  Son is agreeable for morphine drip. ? ?Education provided on current [facility] end of life visitation policy -son expressed understanding. ? ?Discussed with family the importance of continued conversation with each other and the medical providers regarding overall plan of care and treatment options, ensuring decisions are within the context of the patient?s values and GOCs.   ? ?Questions and concerns were addressed. The patient/family was encouraged to call with questions and/or concerns. PMT  card was provided. ? ?Noted patient was being followed  by outpatient palliative care with [facility] -notified them of patient's admission for end-of-life care and possibility family may wish for transfer to residential hospice if patient survives the night. ? ? ?Primary Decision Maker: ?HCPOA/son - Camillia Herter ?  ? ?SUMMARY OF RECOMMENDATIONS   ?Initiated full comfort measures ?Continue DNR/DNI as previously documented ?Not stable for residential hospice transfer at this time - recommend admission for EOL care. If patient survives the night and if symptoms are better managed tomorrow, will consider transfer to hospice ?Added orders for EOL symptom management and to reflect full comfort measures, as well as discontinued orders that were not focused on comfort ?Once breathing easier, RN to remove oxygen  ?Unrestricted visitation orders were placed per current [facility] EOL visitation policy  ?Nursing to provide frequent assessments and administer PRN medications as clinically necessary to ensure EOL comfort ?PMT will continue to follow and support holistically ? ?Symptom Management ?Morphine continuous infusion with PRN bolus doses for breakthrough symptoms ?Tylenol PRN pain/fever ?Biotin twice daily ?Benadryl PRN itching ?Robinul PRN secretions ?Haldol PRN agitation/delirium ?Ativan PRN anxiety/seizure/sleep/distress ?Zofran PRN nausea/vomiting ?Liquifilm Tears PRN dry eye ? ? ?Code Status/Advance Care Planning: ?DNR ? ?Palliative Prophylaxis:  ?Aspiration, Bowel Regimen, Delirium Protocol, Frequent Pain Assessment, Oral Care, and Turn Reposition ? ?Additional Recommendations (Limitations, Scope, Preferences): ?Full Comfort Care ? ?Psycho-social/Spiritual:  ?Desire for further Chaplaincy support:no ?Created space and opportunity for patient and family to express thoughts and feelings regarding patient's current medical situation.  ?Emotional support and therapeutic listening provided. ? ?Prognosis:  ?Hours - Days ? ?Discharge Planning: hospital death vs  residential hospice ? ?  ? ?Primary Diagnoses: ?Present on Admission: ?**None** ? ? ?I have reviewed the medical record, interviewed the patient and family, and examined the patient. The following aspects are pertinent. ? ?Past Medical History:  ?Diagnosis Date  ? A-fib ([place])   ? Anemia   ? Anemia of chronic disease 02/02/2020  ? Atrial fibrillation ([place])   ? Boerhaave's syndrome   ? Chronic anticoagulation 01/22/2020  ? Colon polyps   ? COVID-19   ? Hx of colonic polyps 01/22/2020  ? 2017 - [place]  ? Hypomagnesemia   ? Low grade myelodysplastic syndrome lesions ([place]) 02/02/2020  ? Lower urinary tract symptoms due to benign prostatic hyperplasia   ? Pacemaker   ? Paroxysmal atrial fibrillation ([facility])   ? Prostate enlargement   ? Symptomatic anemia 12/26/2019  ? Vitamin D deficiency   ? ?Social History  ? ?Socioeconomic History  ? Marital status: Married  ?  Spouse name: Not on file  ? Number of children: 2  ? Years of education: Not on file  ? Highest education level: Not on file  ?Occupational History  ? Occupation: retired  ?Tobacco Use  ? Smoking status: Former  ?  Types: Cigarettes  ?  Quit date: 01/21/1970  ?  Years since quitting: 51.9  ? Smokeless tobacco: Never  ?Vaping Use  ? Vaping Use: Never used  ?Substance and Sexual Activity  ? Alcohol use: Not Currently  ? Drug use: Never  ? Sexual activity: Yes  ?Other Topics Concern  ? Not on file  ?Social History Narrative  ? Not on file  ? ?Social Determinants of Health  ? ?Financial Resource Strain: Not on file  ?Food Insecurity: Not on file  ?Transportation Needs: Not on file  ?Physical Activity: Not on file  ?Stress: Not on file  ?Social Connections: Not  on file  ? ?Family History  ?Problem Relation Age of Onset  ? Cirrhosis Mother   ? Alcoholism Mother   ? Heart attack Father 78  ? Heart disease Brother   ? Diabetes Brother   ? Brain cancer Daughter   ? ?Scheduled Meds: ?Continuous Infusions: ?PRN Meds:. ?Medications Prior to Admission:  ?Prior to Admission medications    ?Medication Sig Start Date End Date Taking? Authorizing Provider  ?allopurinol (ZYLOPRIM) 100 MG tablet Take 100 mg by mouth daily. 08/10/21   [provider]  ?cholecalciferol (VITAMIN D3) 60 M

## 2021-12-20 NOTE — ED Notes (Signed)
Chaplain at bedside

## 2021-12-20 NOTE — ED Notes (Signed)
This RN and Amy RN, listened each 1 full minute for heart beat. No pulses felt. Asystole on monitor.  Time of death 76.  ?

## 2021-12-20 DEATH — deceased

## 2021-12-21 LAB — PATHOLOGIST SMEAR REVIEW

## 2021-12-23 LAB — CULTURE, BLOOD (ROUTINE X 2)
Culture: NO GROWTH
Special Requests: ADEQUATE

## 2021-12-24 ENCOUNTER — Inpatient Hospital Stay: Payer: Medicare Other

## 2021-12-26 ENCOUNTER — Inpatient Hospital Stay: Payer: Medicare Other

## 2022-01-07 ENCOUNTER — Other Ambulatory Visit: Payer: Medicare Other

## 2022-01-19 NOTE — Death Summary Note (Signed)
? ?  DEATH SUMMARY  ? ?Patient Details  ?Name: Ricky [name] ?MRN: [id number] ?DOB: 1930/12/12 ?IBB:[id number], Rocco Pauls, NP ?Admission/Discharge Information  ? ?Admit Date:  2022/01/15  ?Date of Death: Date of Death: 01-15-2022  ?Time of Death: Time of Death: 12-31-32  ?Length of Stay: 1  ? ?Principle Cause of death: Sepsis due to pneumonia, secondary to underlying myelodysplastic syndrome  ? ?Hospital Diagnoses: ?Principal Problem: ?  Admission for end of life care ?Active Problems: ?  Paroxysmal atrial fibrillation ([place]) ?  Myelodysplastic syndrome ([place]) ? ? ? ?Assessment and Plan: ?* Admission for end of life care ?-Patient presenting with acute respiratory distress, likely associated with sepsis from pneumonia in the setting of underlying MDS ?-After discussion in the ER, family decided to proceed with comfort care only ?-He was admitted to [facility] for comfort care and palliative care consult ?-Comfort care order set utilized ?-Pain control with morphine drip ?-Patient died in the evening after admission ? ? ? ?Procedures: None ? ?Consultations: Palliative care ? ?The results of significant diagnostics from this hospitalization (including imaging, microbiology, ancillary and laboratory) are listed below for reference.  ? ?Significant Diagnostic Studies: ?DG Chest Port 1 View ? ?Result Date: 01-15-2022 ?CLINICAL DATA:  Sepsis. EXAM: PORTABLE CHEST 1 VIEW COMPARISON:  November 22, 2021. FINDINGS: Stable cardiomediastinal silhouette. Left-sided pacemaker is unchanged in position. Increased patchy airspace opacities are noted throughout both lungs concerning for multifocal pneumonia or less likely edema. Bony thorax is unremarkable. IMPRESSION: Increased patchy airspace opacities are noted bilaterally concerning for multifocal pneumonia or possibly edema. Electronically Signed   By: Marijo Conception M.D.   On: 2022/01/15 10:51   ? ?Microbiology: ?Recent Results (from the past 240 hour(s))  ?Blood Culture (routine x 2)      Status: None (Preliminary result)  ? Collection Time: January 15, 2022  9:50 AM  ? Specimen: BLOOD  ?Result Value Ref Range Status  ? Specimen Description BLOOD RIGHT ANTECUBITAL  Final  ? Special Requests   Final  ?  BOTTLES DRAWN AEROBIC AND ANAEROBIC Blood Culture adequate volume  ? Culture   Final  ?  NO GROWTH 4 DAYS ?Performed at [facility], [place] [address] [place], [place] [address] ?  ? Report Status PENDING  Incomplete  ? ? ?Time spent: <30 minutes ? ?Signed: ?Karmen Bongo, MD ?01-15-22 ? ? ?

## 2022-01-21 ENCOUNTER — Ambulatory Visit: Payer: Medicare Other | Admitting: Hematology and Oncology

## 2022-01-21 ENCOUNTER — Other Ambulatory Visit: Payer: Medicare Other

## 2022-02-04 ENCOUNTER — Other Ambulatory Visit: Payer: Medicare Other

## 2022-02-18 ENCOUNTER — Other Ambulatory Visit: Payer: Medicare Other

## 2023-01-08 IMAGING — DX DG SHOULDER 1V*L*
1 series · 2 of 2 positions shown · non-contrast
Comparison: None

CLINICAL DATA: Weakness, shoulder pain

EXAM:
LEFT SHOULDER

[Series 1: shoulder · 0.14mm/px · 2 of 2 slices shown]
[im 1/2]
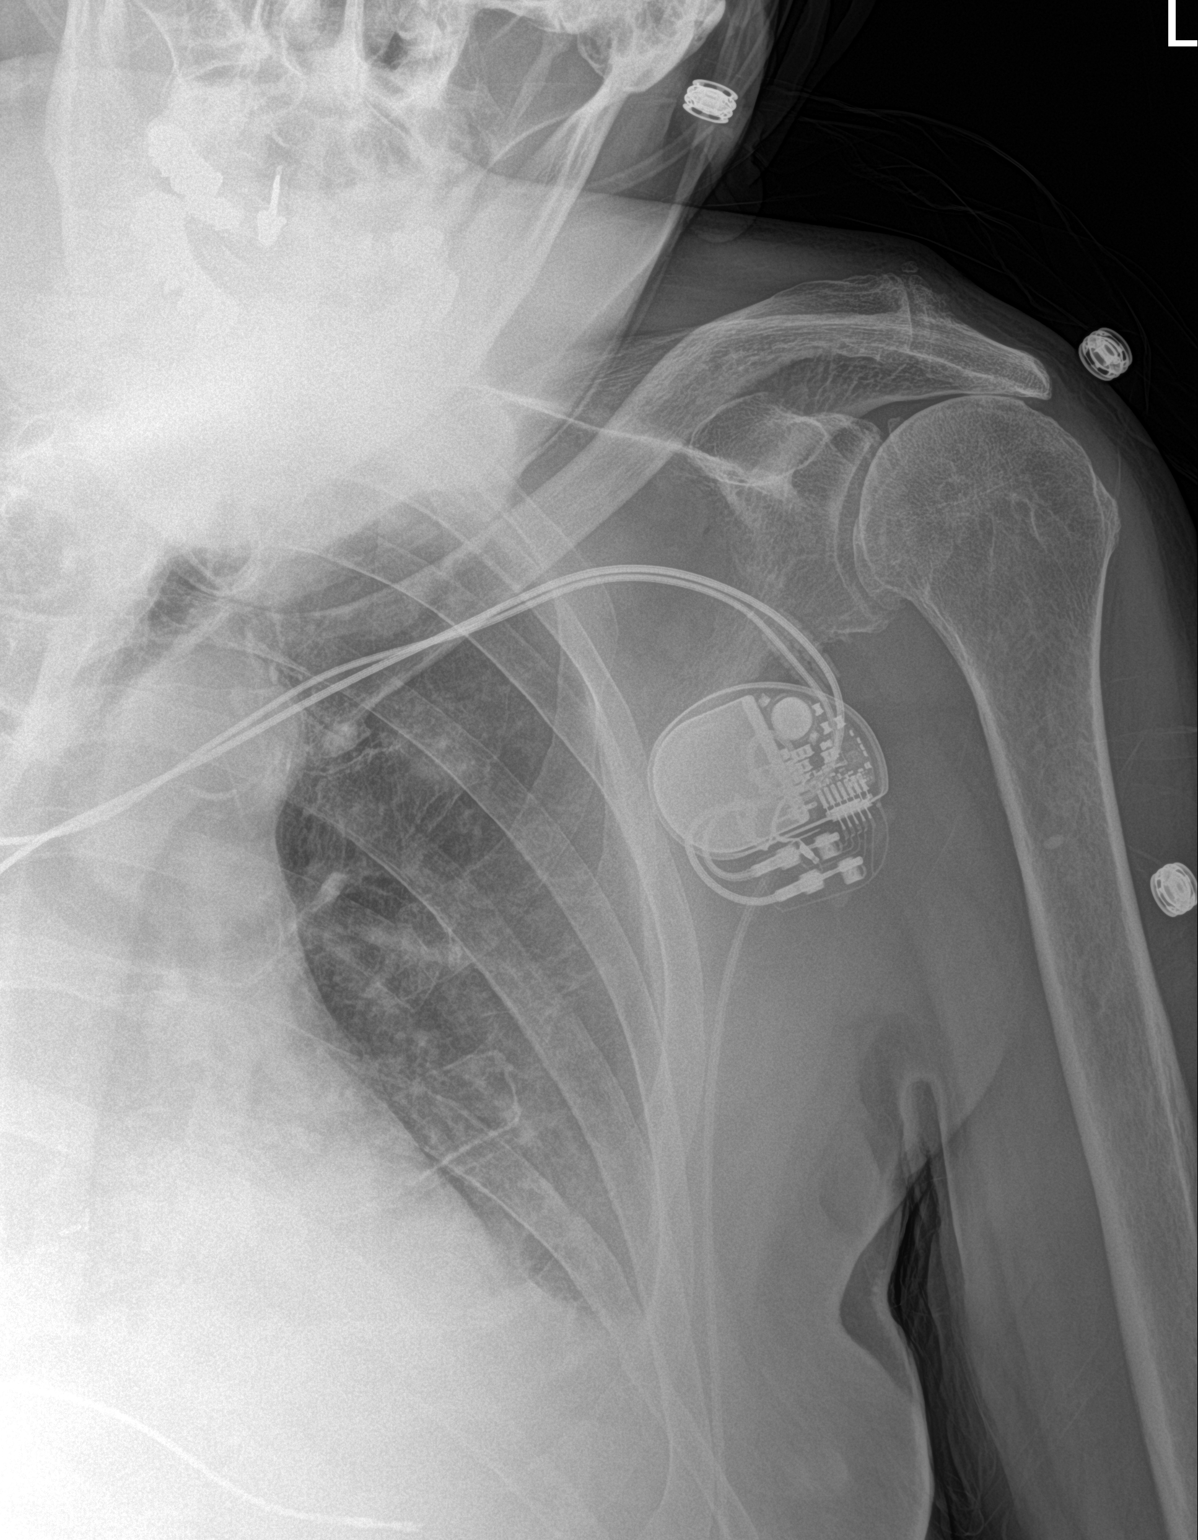
[im 2/2]
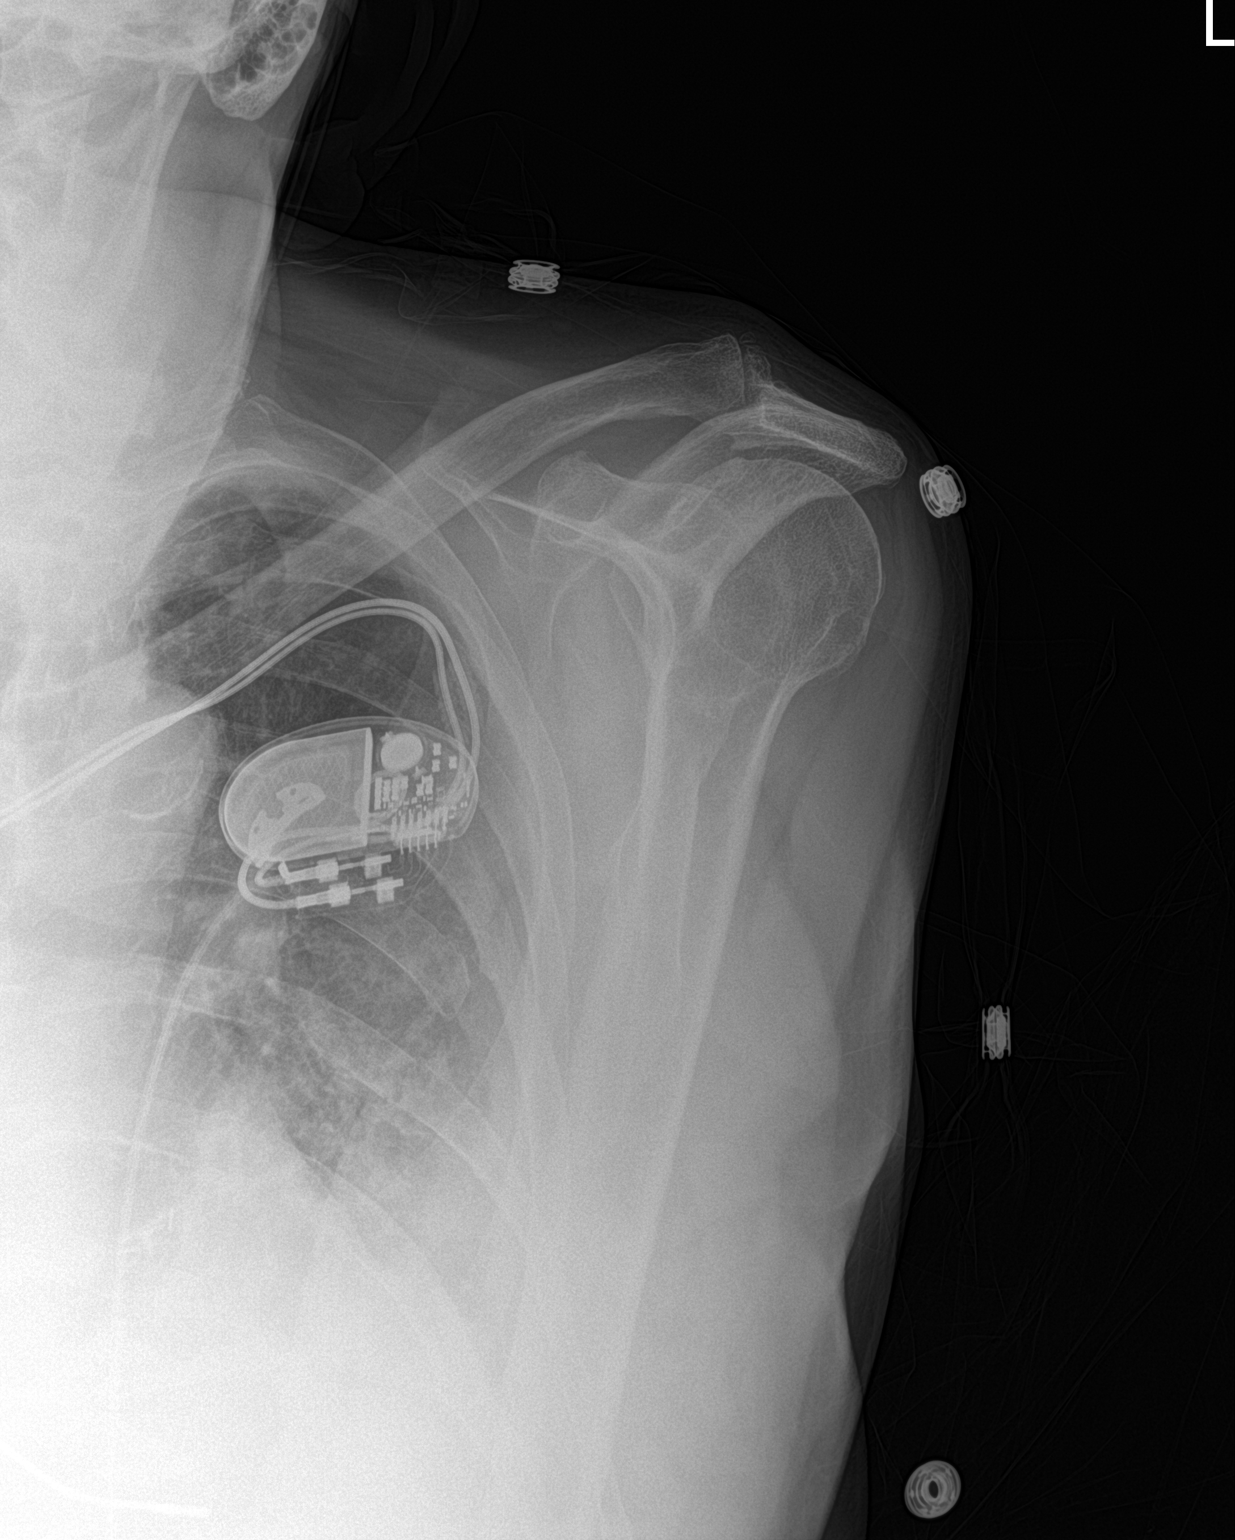

[2 of 2 positions shown; findings below may reference images not displayed]

FINDINGS: Osseous demineralization.

Degenerative changes LEFT AC joint with joint space narrowing and
spur formation.

Additional degenerative changes at LEFT glenohumeral joint.

Approximation of LEFT humeral head with undersurface of acromion,
raising question of rotator cuff tear.

No acute fracture, dislocation, or bone destruction.

Atherosclerotic calcification aorta and pacemaker leads noted.

Question LEFT pleural effusion.
IMPRESSION: Degenerative changes of LEFT acromioclavicular and glenohumeral
joints.

Approximation of humeral head with undersurface of acromion raising
question of rotator cuff tear.
